# Patient Record
Sex: Female | Born: 1965
Health system: Southern US, Community
[De-identification: ages and names within clinical notes are randomized; demographics above are authoritative.]

## PROBLEM LIST (undated history)

## (undated) DIAGNOSIS — R079 Chest pain, unspecified: Secondary | ICD-10-CM

## (undated) DIAGNOSIS — R0600 Dyspnea, unspecified: Secondary | ICD-10-CM

## (undated) DIAGNOSIS — G629 Polyneuropathy, unspecified: Secondary | ICD-10-CM

## (undated) DIAGNOSIS — M5430 Sciatica, unspecified side: Secondary | ICD-10-CM

## (undated) DIAGNOSIS — L930 Discoid lupus erythematosus: Secondary | ICD-10-CM

## (undated) DIAGNOSIS — R600 Localized edema: Secondary | ICD-10-CM

## (undated) DIAGNOSIS — J449 Chronic obstructive pulmonary disease, unspecified: Secondary | ICD-10-CM

## (undated) DIAGNOSIS — M549 Dorsalgia, unspecified: Secondary | ICD-10-CM

## (undated) DIAGNOSIS — K5900 Constipation, unspecified: Secondary | ICD-10-CM

## (undated) DIAGNOSIS — F329 Major depressive disorder, single episode, unspecified: Secondary | ICD-10-CM

## (undated) DIAGNOSIS — M329 Systemic lupus erythematosus, unspecified: Secondary | ICD-10-CM

## (undated) DIAGNOSIS — F909 Attention-deficit hyperactivity disorder, unspecified type: Secondary | ICD-10-CM

## (undated) DIAGNOSIS — M1990 Unspecified osteoarthritis, unspecified site: Secondary | ICD-10-CM

## (undated) DIAGNOSIS — M25579 Pain in unspecified ankle and joints of unspecified foot: Secondary | ICD-10-CM

## (undated) DIAGNOSIS — F419 Anxiety disorder, unspecified: Secondary | ICD-10-CM

## (undated) DIAGNOSIS — Z803 Family history of malignant neoplasm of breast: Secondary | ICD-10-CM

## (undated) DIAGNOSIS — E039 Hypothyroidism, unspecified: Secondary | ICD-10-CM

## (undated) DIAGNOSIS — K219 Gastro-esophageal reflux disease without esophagitis: Secondary | ICD-10-CM

## (undated) DIAGNOSIS — I82409 Acute embolism and thrombosis of unspecified deep veins of unspecified lower extremity: Secondary | ICD-10-CM

## (undated) DIAGNOSIS — Z1379 Encounter for other screening for genetic and chromosomal anomalies: Secondary | ICD-10-CM

## (undated) DIAGNOSIS — M2550 Pain in unspecified joint: Secondary | ICD-10-CM

## (undated) DIAGNOSIS — F32A Depression, unspecified: Secondary | ICD-10-CM

## (undated) DIAGNOSIS — R7303 Prediabetes: Secondary | ICD-10-CM

## (undated) DIAGNOSIS — Z800 Family history of malignant neoplasm of digestive organs: Secondary | ICD-10-CM

## (undated) DIAGNOSIS — G4730 Sleep apnea, unspecified: Secondary | ICD-10-CM

## (undated) DIAGNOSIS — G9332 Myalgic encephalomyelitis/chronic fatigue syndrome: Secondary | ICD-10-CM

## (undated) DIAGNOSIS — R0602 Shortness of breath: Secondary | ICD-10-CM

## (undated) DIAGNOSIS — E559 Vitamin D deficiency, unspecified: Secondary | ICD-10-CM

## (undated) DIAGNOSIS — J45909 Unspecified asthma, uncomplicated: Secondary | ICD-10-CM

## (undated) DIAGNOSIS — G4700 Insomnia, unspecified: Secondary | ICD-10-CM

## (undated) DIAGNOSIS — M797 Fibromyalgia: Secondary | ICD-10-CM

## (undated) DIAGNOSIS — E538 Deficiency of other specified B group vitamins: Secondary | ICD-10-CM

## (undated) DIAGNOSIS — IMO0002 Reserved for concepts with insufficient information to code with codable children: Secondary | ICD-10-CM

## (undated) DIAGNOSIS — R232 Flushing: Secondary | ICD-10-CM

## (undated) DIAGNOSIS — R5382 Chronic fatigue, unspecified: Secondary | ICD-10-CM

## (undated) DIAGNOSIS — E669 Obesity, unspecified: Secondary | ICD-10-CM

## (undated) HISTORY — DX: Insomnia, unspecified: G47.00

## (undated) HISTORY — PX: ABDOMINAL HYSTERECTOMY: SHX81

## (undated) HISTORY — PX: KNEE ARTHROSCOPY W/ MENISCAL REPAIR: SHX1877

## (undated) HISTORY — DX: Vitamin D deficiency, unspecified: E55.9

## (undated) HISTORY — PX: APPENDECTOMY: SHX54

## (undated) HISTORY — DX: Chronic obstructive pulmonary disease, unspecified: J44.9

## (undated) HISTORY — DX: Obesity, unspecified: E66.9

## (undated) HISTORY — DX: Dyspnea, unspecified: R06.00

## (undated) HISTORY — DX: Flushing: R23.2

## (undated) HISTORY — DX: Hypothyroidism, unspecified: E03.9

## (undated) HISTORY — DX: Deficiency of other specified B group vitamins: E53.8

## (undated) HISTORY — DX: Fibromyalgia: M79.7

## (undated) HISTORY — DX: Encounter for other screening for genetic and chromosomal anomalies: Z13.79

## (undated) HISTORY — DX: Constipation, unspecified: K59.00

## (undated) HISTORY — DX: Family history of malignant neoplasm of digestive organs: Z80.0

## (undated) HISTORY — DX: Anxiety disorder, unspecified: F41.9

## (undated) HISTORY — DX: Dorsalgia, unspecified: M54.9

## (undated) HISTORY — DX: Unspecified osteoarthritis, unspecified site: M19.90

## (undated) HISTORY — PX: VEIN LIGATION AND STRIPPING: SHX2653

## (undated) HISTORY — DX: Polyneuropathy, unspecified: G62.9

## (undated) HISTORY — DX: Chest pain, unspecified: R07.9

## (undated) HISTORY — DX: Depression, unspecified: F32.A

## (undated) HISTORY — DX: Shortness of breath: R06.02

## (undated) HISTORY — DX: Gastro-esophageal reflux disease without esophagitis: K21.9

## (undated) HISTORY — DX: Prediabetes: R73.03

## (undated) HISTORY — DX: Sciatica, unspecified side: M54.30

## (undated) HISTORY — DX: Pain in unspecified joint: M25.50

## (undated) HISTORY — DX: Pain in unspecified ankle and joints of unspecified foot: M25.579

## (undated) HISTORY — DX: Family history of malignant neoplasm of breast: Z80.3

## (undated) HISTORY — DX: Major depressive disorder, single episode, unspecified: F32.9

## (undated) HISTORY — DX: Localized edema: R60.0

## (undated) HISTORY — DX: Attention-deficit hyperactivity disorder, unspecified type: F90.9

## (undated) HISTORY — DX: Chronic fatigue, unspecified: R53.82

## (undated) HISTORY — DX: Discoid lupus erythematosus: L93.0

## (undated) HISTORY — DX: Myalgic encephalomyelitis/chronic fatigue syndrome: G93.32

---

## 1985-07-05 HISTORY — PX: TUBAL LIGATION: SHX77

## 2010-04-03 ENCOUNTER — Ambulatory Visit: Payer: Self-pay | Admitting: Oncology

## 2010-04-29 LAB — CBC & DIFF AND RETIC
BASO%: 0.4 % (ref 0.0–2.0)
HCT: 41 % (ref 34.8–46.6)
Immature Retic Fract: 5.3 % (ref 0.00–10.70)
MCHC: 34.6 g/dL (ref 31.5–36.0)
MONO#: 0.5 10*3/uL (ref 0.1–0.9)
RBC: 4.46 10*6/uL (ref 3.70–5.45)
RDW: 14.6 % — ABNORMAL HIGH (ref 11.2–14.5)
Retic %: 0.91 % (ref 0.50–1.50)
Retic Ct Abs: 40.59 10*3/uL (ref 18.30–72.70)
WBC: 7.1 10*3/uL (ref 3.9–10.3)
lymph#: 1.9 10*3/uL (ref 0.9–3.3)

## 2010-04-29 LAB — MORPHOLOGY
PLT EST: ADEQUATE
RBC Comments: NORMAL

## 2010-04-29 LAB — CHCC SMEAR

## 2010-04-29 LAB — APTT: aPTT: 42 seconds — ABNORMAL HIGH (ref 24–37)

## 2010-05-01 LAB — COMPREHENSIVE METABOLIC PANEL
Alkaline Phosphatase: 65 U/L (ref 39–117)
BUN: 9 mg/dL (ref 6–23)
CO2: 20 mEq/L (ref 19–32)
Glucose, Bld: 81 mg/dL (ref 70–99)
Total Bilirubin: 0.3 mg/dL (ref 0.3–1.2)

## 2010-05-01 LAB — LACTATE DEHYDROGENASE: LDH: 176 U/L (ref 94–250)

## 2010-05-01 LAB — PROTHROMBIN GENE MUTATION

## 2010-05-01 LAB — SEDIMENTATION RATE: Sed Rate: 9 mm/hr (ref 0–22)

## 2010-05-01 LAB — ANTITHROMBIN III: AntiThromb III Func: 119 % (ref 76–126)

## 2010-05-01 LAB — D-DIMER, QUANTITATIVE: D-Dimer, Quant: 0.22 ug/mL-FEU (ref 0.00–0.48)

## 2010-05-19 ENCOUNTER — Ambulatory Visit: Payer: Self-pay | Admitting: Oncology

## 2010-05-21 LAB — PROTEIN C ACTIVITY: Protein C Activity: 145 % — ABNORMAL HIGH (ref 75–133)

## 2010-05-21 LAB — PROTEIN S, ANTIGEN, FREE: Protein S Ag, Free: 102 % normal (ref 50–147)

## 2010-07-02 ENCOUNTER — Ambulatory Visit: Payer: Self-pay | Admitting: Oncology

## 2010-12-25 ENCOUNTER — Other Ambulatory Visit: Payer: Self-pay | Admitting: Oncology

## 2010-12-25 ENCOUNTER — Encounter (HOSPITAL_BASED_OUTPATIENT_CLINIC_OR_DEPARTMENT_OTHER): Payer: PRIVATE HEALTH INSURANCE | Admitting: Oncology

## 2010-12-25 DIAGNOSIS — I82819 Embolism and thrombosis of superficial veins of unspecified lower extremities: Secondary | ICD-10-CM

## 2010-12-25 DIAGNOSIS — R3129 Other microscopic hematuria: Secondary | ICD-10-CM

## 2010-12-25 DIAGNOSIS — M329 Systemic lupus erythematosus, unspecified: Secondary | ICD-10-CM

## 2010-12-25 LAB — COMPREHENSIVE METABOLIC PANEL
ALT: 19 U/L (ref 0–35)
AST: 19 U/L (ref 0–37)
Albumin: 4 g/dL (ref 3.5–5.2)
CO2: 23 mEq/L (ref 19–32)
Calcium: 9.8 mg/dL (ref 8.4–10.5)
Chloride: 102 mEq/L (ref 96–112)
Potassium: 4 mEq/L (ref 3.5–5.3)
Sodium: 137 mEq/L (ref 135–145)
Total Protein: 6.7 g/dL (ref 6.0–8.3)

## 2010-12-25 LAB — URINALYSIS, MICROSCOPIC - CHCC
Nitrite: NEGATIVE
Protein: NEGATIVE mg/dL
Specific Gravity, Urine: 1.02 (ref 1.003–1.035)
WBC, UA: NEGATIVE (ref 0–2)
pH: 6.5 (ref 4.6–8.0)

## 2010-12-25 LAB — CBC WITH DIFFERENTIAL/PLATELET
BASO%: 0.4 % (ref 0.0–2.0)
Basophils Absolute: 0 10*3/uL (ref 0.0–0.1)
EOS%: 1.8 % (ref 0.0–7.0)
HGB: 14.4 g/dL (ref 11.6–15.9)
MCH: 32.9 pg (ref 25.1–34.0)
MCHC: 34.8 g/dL (ref 31.5–36.0)
MCV: 94.7 fL (ref 79.5–101.0)
MONO%: 9 % (ref 0.0–14.0)
RBC: 4.38 10*6/uL (ref 3.70–5.45)
RDW: 13 % (ref 11.2–14.5)
lymph#: 1.7 10*3/uL (ref 0.9–3.3)

## 2010-12-25 LAB — LACTATE DEHYDROGENASE: LDH: 157 U/L (ref 94–250)

## 2010-12-25 LAB — PROTIME-INR: INR: 1 — ABNORMAL LOW (ref 2.00–3.50)

## 2011-05-20 ENCOUNTER — Telehealth: Payer: Self-pay | Admitting: Oncology

## 2011-05-20 NOTE — Telephone Encounter (Signed)
Lvm advising 12/14 appt has been cx'd due to Epic. Advised in vm, we will call back to r/s. °

## 2011-05-22 ENCOUNTER — Telehealth: Payer: Self-pay | Admitting: Oncology

## 2011-05-22 NOTE — Telephone Encounter (Signed)
S/w pt to r/s 12/14 cx'd appt. Pt says she is no loger seeing Dr. Cyndie Chime because her insurance no longer covers it. Pt stated she transferred her care. No appts made.

## 2014-11-18 ENCOUNTER — Ambulatory Visit: Payer: Self-pay | Admitting: Internal Medicine

## 2014-11-25 ENCOUNTER — Ambulatory Visit: Payer: Self-pay | Admitting: Internal Medicine

## 2014-11-28 ENCOUNTER — Emergency Department (HOSPITAL_BASED_OUTPATIENT_CLINIC_OR_DEPARTMENT_OTHER)
Admit: 2014-11-28 | Discharge: 2014-11-28 | Disposition: A | Discharge status: Home / Self Care | Payer: BLUE CROSS/BLUE SHIELD | Attending: Emergency Medicine | Admitting: Emergency Medicine

## 2014-11-28 ENCOUNTER — Emergency Department (HOSPITAL_COMMUNITY): Payer: BLUE CROSS/BLUE SHIELD

## 2014-11-28 ENCOUNTER — Encounter (HOSPITAL_COMMUNITY): Payer: Self-pay | Admitting: Emergency Medicine

## 2014-11-28 ENCOUNTER — Emergency Department (HOSPITAL_COMMUNITY)
Admission: EM | Admit: 2014-11-28 | Discharge: 2014-11-28 | Disposition: A | Discharge status: Home / Self Care | Payer: BLUE CROSS/BLUE SHIELD | Attending: Emergency Medicine | Admitting: Emergency Medicine

## 2014-11-28 DIAGNOSIS — Z86718 Personal history of other venous thrombosis and embolism: Secondary | ICD-10-CM | POA: Diagnosis not present

## 2014-11-28 DIAGNOSIS — M7989 Other specified soft tissue disorders: Secondary | ICD-10-CM | POA: Diagnosis not present

## 2014-11-28 DIAGNOSIS — J45901 Unspecified asthma with (acute) exacerbation: Secondary | ICD-10-CM | POA: Diagnosis not present

## 2014-11-28 DIAGNOSIS — Z87828 Personal history of other (healed) physical injury and trauma: Secondary | ICD-10-CM | POA: Insufficient documentation

## 2014-11-28 DIAGNOSIS — M79609 Pain in unspecified limb: Secondary | ICD-10-CM

## 2014-11-28 DIAGNOSIS — I809 Phlebitis and thrombophlebitis of unspecified site: Secondary | ICD-10-CM

## 2014-11-28 DIAGNOSIS — I8002 Phlebitis and thrombophlebitis of superficial vessels of left lower extremity: Secondary | ICD-10-CM | POA: Diagnosis not present

## 2014-11-28 DIAGNOSIS — Z7982 Long term (current) use of aspirin: Secondary | ICD-10-CM | POA: Insufficient documentation

## 2014-11-28 DIAGNOSIS — Z79899 Other long term (current) drug therapy: Secondary | ICD-10-CM | POA: Insufficient documentation

## 2014-11-28 DIAGNOSIS — Z720 Tobacco use: Secondary | ICD-10-CM | POA: Diagnosis not present

## 2014-11-28 DIAGNOSIS — R000 Tachycardia, unspecified: Secondary | ICD-10-CM | POA: Insufficient documentation

## 2014-11-28 DIAGNOSIS — Z8669 Personal history of other diseases of the nervous system and sense organs: Secondary | ICD-10-CM | POA: Insufficient documentation

## 2014-11-28 DIAGNOSIS — R0602 Shortness of breath: Secondary | ICD-10-CM

## 2014-11-28 HISTORY — DX: Sleep apnea, unspecified: G47.30

## 2014-11-28 HISTORY — DX: Unspecified asthma, uncomplicated: J45.909

## 2014-11-28 HISTORY — DX: Systemic lupus erythematosus, unspecified: M32.9

## 2014-11-28 HISTORY — DX: Reserved for concepts with insufficient information to code with codable children: IMO0002

## 2014-11-28 HISTORY — DX: Acute embolism and thrombosis of unspecified deep veins of unspecified lower extremity: I82.409

## 2014-11-28 LAB — CBC
HEMATOCRIT: 44.4 % (ref 36.0–46.0)
HEMOGLOBIN: 14.9 g/dL (ref 12.0–15.0)
MCH: 31.2 pg (ref 26.0–34.0)
MCHC: 33.6 g/dL (ref 30.0–36.0)
MCV: 93.1 fL (ref 78.0–100.0)
Platelets: 325 10*3/uL (ref 150–400)
RBC: 4.77 MIL/uL (ref 3.87–5.11)
RDW: 13.2 % (ref 11.5–15.5)
WBC: 6.9 10*3/uL (ref 4.0–10.5)

## 2014-11-28 LAB — I-STAT TROPONIN, ED: Troponin i, poc: 0 ng/mL (ref 0.00–0.08)

## 2014-11-28 LAB — BASIC METABOLIC PANEL
ANION GAP: 10 (ref 5–15)
BUN: 17 mg/dL (ref 6–20)
CHLORIDE: 102 mmol/L (ref 101–111)
CO2: 26 mmol/L (ref 22–32)
CREATININE: 0.77 mg/dL (ref 0.44–1.00)
Calcium: 9.7 mg/dL (ref 8.9–10.3)
GFR calc Af Amer: >60 mL/min (ref >60)
GFR calc non Af Amer: >60 mL/min (ref >60)
GLUCOSE: 86 mg/dL (ref 65–99)
Potassium: 4.2 mmol/L (ref 3.5–5.1)
Sodium: 138 mmol/L (ref 135–145)

## 2014-11-28 LAB — PROTIME-INR
INR: 1.02 (ref 0.00–1.49)
PROTHROMBIN TIME: 13.6 s (ref 11.6–15.2)

## 2014-11-28 MED ORDER — IOHEXOL 350 MG/ML SOLN
100.0000 mL | Freq: Once | INTRAVENOUS | Status: AC | PRN
  Administered 2014-11-28: 100 mL via INTRAVENOUS

## 2014-11-28 MED ORDER — MORPHINE SULFATE 4 MG/ML IJ SOLN
4.0000 mg | INTRAMUSCULAR | Status: DC | PRN
  Administered 2014-11-28: 4 mg via INTRAVENOUS
  Filled 2014-11-28: qty 1

## 2014-11-28 MED ORDER — ONDANSETRON HCL 4 MG/2ML IJ SOLN
4.0000 mg | Freq: Once | INTRAMUSCULAR | Status: AC
  Administered 2014-11-28: 4 mg via INTRAVENOUS
  Filled 2014-11-28: qty 2

## 2014-11-28 NOTE — Progress Notes (Signed)
Left lower extremity venous duplex completed.  Left:  No evidence of DVT, superficial thrombosis, or Baker's cyst.  Right:  Negative for DVT in the common femoral vein.  

## 2014-11-28 NOTE — Discharge Instructions (Signed)
Increase Aspirin to am and pm. Support stockings. Stay active. Apply heat for 20 minutes at a time 2-3 times per day.  Phlebitis Phlebitis is soreness and swelling (inflammation) of a vein. This can occur in your arms, legs, or torso (trunk), as well as deeper inside your body. Phlebitis is usually not serious when it occurs close to the surface of the body. However, it can cause serious problems when it occurs in a vein deeper inside the body. CAUSES  Phlebitis can be triggered by various things, including:   Reduced blood flow through your veins. This can happen with:  Bed rest over a long period.  Long-distance travel.  Injury.  Surgery.  Being overweight (obese) or pregnant.  Having an IV tube put in the vein and getting certain medicines through the vein.  Cancer and cancer treatment.  Use of illegal drugs taken through the vein.  Inflammatory diseases.  Inherited (genetic) diseases that increase the risk of blood clots.  Hormone therapy, such as birth control pills. SIGNS AND SYMPTOMS   Red, tender, swollen, and painful area on your skin. Usually, the area will be long and narrow.  Firmness along the center of the affected area. This can indicate that a blood clot has formed.  Low-grade fever. DIAGNOSIS  A health care provider can usually diagnose phlebitis by examining the affected area and asking about your symptoms. To check for infection or blood clots, your health care provider may order blood tests or an ultrasound exam of the area. Blood tests and your family history may also indicate if you have an underlying genetic disease that causes blood clots. Occasionally, a piece of tissue is taken from the body (biopsy sample) if an unusual cause of phlebitis is suspected. TREATMENT  Treatment will vary depending on the severity of the condition and the area of the body affected. Treatment may include:  Use of a warm compress or heating pad.  Use of compression  stockings or bandages.  Anti-inflammatory medicines.  Removal of any IV tube that may be causing the problem.  Medicines that kill germs (antibiotics) if an infection is present.  Blood-thinning medicines if a blood clot is suspected or present.  In rare cases, surgery may be needed to remove damaged sections of vein. HOME CARE INSTRUCTIONS   Only take over-the-counter or prescription medicines as directed by your health care provider. Take all medicines exactly as prescribed.  Raise (elevate) the affected area above the level of your heart as directed by your health care provider.  Apply a warm compress or heating pad to the affected area as directed by your health care provider. Do not sleep with the heating pad.  Use compression stockings or bandages as directed. These will speed healing and prevent the condition from coming back.  If you are on blood thinners:  Get follow-up blood tests as directed by your health care provider.  Check with your health care provider before using any new medicines.  Carry a medical alert card or wear your medical alert jewelry to show that you are on blood thinners.  For phlebitis in the legs:  Avoid prolonged standing or bed rest.  Keep your legs moving. Raise your legs when sitting or lying.  Do not smoke.  Women, particularly those over the age of 43, should consider the risks and benefits of taking the contraceptive pill. This kind of hormone treatment can increase your risk for blood clots.  Follow up with your health care provider as directed.  SEEK MEDICAL CARE IF:   You have unusual bruising or any bleeding problems.  Your swelling or pain in the affected area is not improving.  You are on anti-inflammatory medicine, and you develop belly (abdominal) pain. SEEK IMMEDIATE MEDICAL CARE IF:   You have a sudden onset of chest pain or difficulty breathing.  You have a fever or persistent symptoms for more than 2-3 days.  You  have a fever and your symptoms suddenly get worse. MAKE SURE YOU:  Understand these instructions.  Will watch your condition.  Will get help right away if you are not doing well or get worse. Document Released: 06/15/2001 Document Revised: 04/11/2013 Document Reviewed: 02/26/2013 [facility] Patient Information 2015 [employer] [place]. This information is not intended to replace advice given to you by your health care provider. Make sure you discuss any questions you have with your health care provider.

## 2014-11-28 NOTE — ED Notes (Signed)
C/o left leg swelling x 2 days with hx of DVT in same leg 2000--- was on coumadin, then pradaxa, now ASA/qd--  Left leg swollen, pain at medial knee area.
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
## 2014-11-28 NOTE — ED Notes (Signed)
Attempted IV access x2 by this RN, unable to thread IV

## 2014-12-09 ENCOUNTER — Telehealth: Payer: Self-pay | Admitting: Internal Medicine

## 2014-12-09 ENCOUNTER — Encounter: Payer: Self-pay | Admitting: Internal Medicine

## 2014-12-09 ENCOUNTER — Other Ambulatory Visit (INDEPENDENT_AMBULATORY_CARE_PROVIDER_SITE_OTHER): Payer: BLUE CROSS/BLUE SHIELD

## 2014-12-09 ENCOUNTER — Ambulatory Visit (INDEPENDENT_AMBULATORY_CARE_PROVIDER_SITE_OTHER): Payer: BLUE CROSS/BLUE SHIELD | Admitting: Internal Medicine

## 2014-12-09 VITALS — BP 130/98 | HR 98 | Ht 65.0 in | Wt 252.0 lb

## 2014-12-09 DIAGNOSIS — Z6838 Body mass index (BMI) 38.0-38.9, adult: Secondary | ICD-10-CM

## 2014-12-09 DIAGNOSIS — G4733 Obstructive sleep apnea (adult) (pediatric): Secondary | ICD-10-CM

## 2014-12-09 DIAGNOSIS — E039 Hypothyroidism, unspecified: Secondary | ICD-10-CM | POA: Insufficient documentation

## 2014-12-09 DIAGNOSIS — R5382 Chronic fatigue, unspecified: Secondary | ICD-10-CM | POA: Diagnosis not present

## 2014-12-09 DIAGNOSIS — R202 Paresthesia of skin: Secondary | ICD-10-CM

## 2014-12-09 DIAGNOSIS — E034 Atrophy of thyroid (acquired): Secondary | ICD-10-CM

## 2014-12-09 DIAGNOSIS — G2581 Restless legs syndrome: Secondary | ICD-10-CM

## 2014-12-09 DIAGNOSIS — Z1239 Encounter for other screening for malignant neoplasm of breast: Secondary | ICD-10-CM

## 2014-12-09 DIAGNOSIS — F17210 Nicotine dependence, cigarettes, uncomplicated: Secondary | ICD-10-CM

## 2014-12-09 DIAGNOSIS — G9332 Myalgic encephalomyelitis/chronic fatigue syndrome: Secondary | ICD-10-CM

## 2014-12-09 DIAGNOSIS — Z86718 Personal history of other venous thrombosis and embolism: Secondary | ICD-10-CM | POA: Insufficient documentation

## 2014-12-09 DIAGNOSIS — E038 Other specified hypothyroidism: Secondary | ICD-10-CM

## 2014-12-09 DIAGNOSIS — L930 Discoid lupus erythematosus: Secondary | ICD-10-CM | POA: Insufficient documentation

## 2014-12-09 LAB — BASIC METABOLIC PANEL
BUN: 16 mg/dL (ref 6–23)
CHLORIDE: 105 meq/L (ref 96–112)
CO2: 29 mEq/L (ref 19–32)
CREATININE: 0.64 mg/dL (ref 0.40–1.20)
Calcium: 9.8 mg/dL (ref 8.4–10.5)
GFR: 104.35 mL/min (ref >60.00)
Glucose, Bld: 88 mg/dL (ref 70–99)
POTASSIUM: 4 meq/L (ref 3.5–5.1)
Sodium: 140 mEq/L (ref 135–145)

## 2014-12-09 LAB — CBC WITH DIFFERENTIAL/PLATELET
BASOS ABS: 0 10*3/uL (ref 0.0–0.1)
Basophils Relative: 0.3 % (ref 0.0–3.0)
EOS PCT: 2.4 % (ref 0.0–5.0)
Eosinophils Absolute: 0.2 10*3/uL (ref 0.0–0.7)
HCT: 45 % (ref 36.0–46.0)
Hemoglobin: 15 g/dL (ref 12.0–15.0)
LYMPHS ABS: 1.8 10*3/uL (ref 0.7–4.0)
Lymphocytes Relative: 20.7 % (ref 12.0–46.0)
MCHC: 33.3 g/dL (ref 30.0–36.0)
MCV: 92.7 fl (ref 78.0–100.0)
Monocytes Absolute: 0.7 10*3/uL (ref 0.1–1.0)
Monocytes Relative: 7.8 % (ref 3.0–12.0)
NEUTROS ABS: 6 10*3/uL (ref 1.4–7.7)
Neutrophils Relative %: 68.8 % (ref 43.0–77.0)
Platelets: 364 10*3/uL (ref 150.0–400.0)
RBC: 4.86 Mil/uL (ref 3.87–5.11)
RDW: 13.7 % (ref 11.5–15.5)
WBC: 8.8 10*3/uL (ref 4.0–10.5)

## 2014-12-09 LAB — URINALYSIS
Bilirubin Urine: NEGATIVE
Hgb urine dipstick: NEGATIVE
Ketones, ur: NEGATIVE
Leukocytes, UA: NEGATIVE
Nitrite: NEGATIVE
Specific Gravity, Urine: 1.015 (ref 1.000–1.030)
TOTAL PROTEIN, URINE-UPE24: NEGATIVE
Urine Glucose: NEGATIVE
Urobilinogen, UA: 0.2 (ref 0.0–1.0)
pH: 7 (ref 5.0–8.0)

## 2014-12-09 LAB — LIPID PANEL
Cholesterol: 184 mg/dL (ref 0–200)
HDL: 61.4 mg/dL (ref >39.00)
LDL Cholesterol: 100 mg/dL — ABNORMAL HIGH (ref 0–99)
NonHDL: 122.6
Total CHOL/HDL Ratio: 3
Triglycerides: 112 mg/dL (ref 0.0–149.0)
VLDL: 22.4 mg/dL (ref 0.0–40.0)

## 2014-12-09 LAB — HEPATIC FUNCTION PANEL
ALT: 18 U/L (ref 0–35)
AST: 17 U/L (ref 0–37)
Albumin: 4.5 g/dL (ref 3.5–5.2)
Alkaline Phosphatase: 88 U/L (ref 39–117)
BILIRUBIN DIRECT: 0 mg/dL (ref 0.0–0.3)
Total Bilirubin: 0.3 mg/dL (ref 0.2–1.2)
Total Protein: 7.4 g/dL (ref 6.0–8.3)

## 2014-12-09 LAB — T4, FREE: Free T4: 0.77 ng/dL (ref 0.60–1.60)

## 2014-12-09 LAB — TSH: TSH: 0.84 u[IU]/mL (ref 0.35–4.50)

## 2014-12-09 LAB — VITAMIN D 25 HYDROXY (VIT D DEFICIENCY, FRACTURES): VITD: 41.25 ng/mL (ref 30.00–100.00)

## 2014-12-09 LAB — VITAMIN B12: Vitamin B-12: 230 pg/mL (ref 211–911)

## 2014-12-09 MED ORDER — CYANOCOBALAMIN 1000 MCG SL SUBL: 1.0000 | SUBLINGUAL_TABLET | Freq: Every day | SUBLINGUAL | Status: DC

## 2014-12-09 MED ORDER — VITAMIN D3 50 MCG (2000 UT) PO CAPS: 2000.0000 [IU] | ORAL_CAPSULE | Freq: Every day | ORAL | Status: DC

## 2014-12-09 MED ORDER — AMPHETAMINE-DEXTROAMPHETAMINE 10 MG PO TABS: 20.0000 mg | ORAL_TABLET | Freq: Two times a day (BID) | ORAL | Status: DC

## 2014-12-09 MED ORDER — ALPRAZOLAM 1 MG PO TABS: 1.0000 mg | ORAL_TABLET | Freq: Every evening | ORAL | Status: DC | PRN

## 2014-12-09 MED ORDER — ROPINIROLE HCL 0.5 MG PO TABS: 0.5000 mg | ORAL_TABLET | Freq: Every day | ORAL | Status: DC

## 2014-12-09 MED ORDER — TRAMADOL HCL 50 MG PO TABS: 50.0000 mg | ORAL_TABLET | Freq: Two times a day (BID) | ORAL | Status: DC

## 2014-12-09 MED ORDER — GABAPENTIN 300 MG PO CAPS: 300.0000 mg | ORAL_CAPSULE | Freq: Every day | ORAL | Status: DC

## 2014-12-09 NOTE — Telephone Encounter (Signed)
Needs to verify directions on Adderall.

## 2014-12-09 NOTE — Assessment & Plan Note (Signed)
Labs

## 2014-12-09 NOTE — Assessment & Plan Note (Signed)
OSA, meds (chronic -Tramadol, Requip, Gabapentin, Trazodone) , other causes Pt is willing to d/c Trazodone On Adderall per her former PCP Neurol ref to address OSA treatment Labs

## 2014-12-09 NOTE — Assessment & Plan Note (Signed)
Tramadol, Requip, Gabapentin Neurol ref Dr Vickey Hugerohmeier

## 2014-12-09 NOTE — Telephone Encounter (Signed)
Patient also called in regards to this script.  [facility].  Is requesting to be resent.

## 2014-12-09 NOTE — Assessment & Plan Note (Signed)
LLE 2000

## 2014-12-09 NOTE — Assessment & Plan Note (Signed)
Chronic LLE sciatica On Tramadol, Gabapentin

## 2014-12-09 NOTE — Assessment & Plan Note (Signed)
Rash Remote dx by bx

## 2014-12-09 NOTE — Assessment & Plan Note (Signed)
Discussed.

## 2014-12-09 NOTE — Assessment & Plan Note (Signed)
Discussed options including lap band

## 2014-12-09 NOTE — Progress Notes (Signed)
Pre visit review using our clinic review tool, if applicable. No additional management support is needed unless otherwise documented below in the visit note. 

## 2014-12-09 NOTE — Progress Notes (Signed)
Subjective:    HPI   New pt  C/o stress and fatigue x years. C/o wt gain; unable to loose wt. F/u hypothyroidism. C/o OSA -  not on Rx. C/o RLS - requip, tramadol is helpful. C/o ADD/CFS/shift work - she was prescribed Adderal by her PCP. 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  Review of Systems  Constitutional: Positive for fatigue. Negative for fever, chills, diaphoresis, activity change, appetite change and unexpected weight change.  HENT: Negative for congestion, dental problem, ear pain, hearing loss, mouth sores, postnasal drip, sinus pressure, sneezing, sore throat and voice change.   Eyes: Negative for pain and visual disturbance.  Respiratory: Negative for cough, chest tightness, wheezing and stridor.   Cardiovascular: Negative for chest pain, palpitations and leg swelling.  Gastrointestinal: Negative for nausea, vomiting, abdominal pain, blood in stool, abdominal distention and rectal pain.  Genitourinary: Negative for dysuria, frequency, hematuria, decreased urine volume, vaginal bleeding, vaginal discharge, difficulty urinating, vaginal pain and menstrual problem.  Musculoskeletal: Positive for arthralgias. Negative for back pain, joint swelling, gait problem and neck pain.  Skin: Negative for color change, rash and wound.  Neurological: Positive for dizziness and headaches. Negative for tremors, syncope, speech difficulty, weakness and light-headedness.  Hematological: Negative for adenopathy.  Psychiatric/Behavioral: Positive for dysphoric mood. Negative for suicidal ideas, hallucinations, behavioral problems, confusion, sleep disturbance and decreased concentration. The patient is nervous/anxious. The patient is not hyperactive.     Wt  Readings from Last 3 Encounters:  12/09/14 252 lb (114.306 kg)  11/28/14 245 lb (111.131 kg)    BP Readings from Last 3 Encounters:  12/09/14 130/98  11/28/14 111/68        Objective:   Physical Exam  Constitutional: She appears well-developed. No distress.  HENT:  Head: Normocephalic.  Right Ear: External ear normal.  Left Ear: External ear normal.  Nose: Nose normal.  Mouth/Throat: Oropharynx is clear and moist.  Eyes: Conjunctivae are normal. Pupils are equal, round, and reactive to light. Right eye exhibits no discharge. Left eye exhibits no discharge.  Neck: Normal range of motion. Neck supple. No JVD present. No tracheal deviation present. No thyromegaly present.  Cardiovascular: Normal rate, regular rhythm and normal heart sounds.   Pulmonary/Chest: No stridor. No respiratory distress. She has no wheezes.  Abdominal: Soft. Bowel sounds are normal. She exhibits no distension and no mass. There is no tenderness. There is no rebound and no guarding.  Musculoskeletal: She exhibits no edema or tenderness.  Lymphadenopathy:    She has no cervical adenopathy.  Neurological: She displays normal reflexes. No cranial nerve deficit. She exhibits normal muscle tone. Coordination normal.  Skin: No rash noted. No erythema.  Psychiatric: Her behavior is normal. Judgment and thought content normal.  Obese  Lab Results  Component Value Date   WBC 6.9 11/28/2014   HGB 14.9 11/28/2014   HCT 44.4 11/28/2014   PLT 325 11/28/2014   GLUCOSE 86 11/28/2014   ALT 19 12/25/2010   AST 19 12/25/2010   NA 138 11/28/2014   K 4.2 11/28/2014   CL 102 11/28/2014   CREATININE 0.77 11/28/2014   BUN 17 11/28/2014   CO2 26 11/28/2014   INR 1.02 11/28/2014  Assessment & Plan:

## 2014-12-10 ENCOUNTER — Telehealth: Payer: Self-pay

## 2014-12-10 MED ORDER — ROPINIROLE HCL 0.5 MG PO TABS: 0.5000 mg | ORAL_TABLET | Freq: Every day | ORAL | Status: DC

## 2014-12-10 MED ORDER — AMPHETAMINE-DEXTROAMPHETAMINE 10 MG PO TABS: 10.0000 mg | ORAL_TABLET | Freq: Two times a day (BID) | ORAL | Status: DC

## 2014-12-10 MED ORDER — AMPHETAMINE-DEXTROAMPHETAMINE 10 MG PO TABS: 20.0000 mg | ORAL_TABLET | Freq: Every day | ORAL | Status: DC

## 2014-12-10 NOTE — Telephone Encounter (Signed)
Sorry: Adderall 10 2 in an or 1 bid (am and noon) is correct. Requip renewed Thx

## 2014-12-10 NOTE — Telephone Encounter (Signed)
Pt informed of labs.   Stated that there was an issue with the rx for adderall, cyanacobalamin and requip.   LVM for pharmacist to call me back.

## 2014-12-10 NOTE — Telephone Encounter (Signed)
Clarify sig for Adderall.

## 2014-12-10 NOTE — Telephone Encounter (Signed)
[facility] They have the verbal change on the adderrall sig: 10mg  bid.  They also have the rx for the subl B12 and for the requip. Pharmacist stated that the requip is not able to be filled at this time. 90d picked up in May.

## 2014-12-11 NOTE — Telephone Encounter (Signed)
Ernestine McmurrayStefannie M Cairrikier, CMA at 12/10/2014 1:30 PM     Status: Signed       Expand All Collapse All   [facility]. They have the verbal change on the adderrall sig: 10mg  bid.  They also have the rx for the subl B12 and for the requip. Pharmacist stated that the requip is not able to be filled at this time. 90d picked up in May.

## 2014-12-14 ENCOUNTER — Telehealth: Payer: Self-pay | Admitting: Internal Medicine

## 2014-12-14 NOTE — Telephone Encounter (Signed)
Records received from [facility], forwarded to [facility], rmf

## 2014-12-20 ENCOUNTER — Telehealth: Payer: Self-pay

## 2014-12-20 NOTE — Telephone Encounter (Signed)
PA for Adderall started 12/08/2014.  Received a fax today stating that a PA was not required.   Pharmacy contacted and Pt is informed.

## 2014-12-31 ENCOUNTER — Encounter: Payer: Self-pay | Admitting: Internal Medicine

## 2014-12-31 ENCOUNTER — Ambulatory Visit: Payer: Self-pay | Admitting: Internal Medicine

## 2015-01-07 ENCOUNTER — Telehealth: Payer: Self-pay | Admitting: Internal Medicine

## 2015-01-07 MED ORDER — AMPHETAMINE-DEXTROAMPHETAMINE 10 MG PO TABS: 10.0000 mg | ORAL_TABLET | Freq: Two times a day (BID) | ORAL | Status: DC

## 2015-01-07 NOTE — Telephone Encounter (Signed)
Ok to fill per MD. Keep ROV as scheduled. Rx printed/signed/given to pt.

## 2015-01-07 NOTE — Telephone Encounter (Signed)
Patient need a new prescription for Adderall

## 2015-01-20 ENCOUNTER — Ambulatory Visit (INDEPENDENT_AMBULATORY_CARE_PROVIDER_SITE_OTHER): Payer: BLUE CROSS/BLUE SHIELD | Admitting: Internal Medicine

## 2015-01-20 ENCOUNTER — Encounter: Payer: Self-pay | Admitting: Internal Medicine

## 2015-01-20 VITALS — BP 132/96 | HR 102 | Ht 65.0 in | Wt 245.0 lb

## 2015-01-20 DIAGNOSIS — F909 Attention-deficit hyperactivity disorder, unspecified type: Secondary | ICD-10-CM

## 2015-01-20 DIAGNOSIS — L930 Discoid lupus erythematosus: Secondary | ICD-10-CM

## 2015-01-20 DIAGNOSIS — R21 Rash and other nonspecific skin eruption: Secondary | ICD-10-CM | POA: Diagnosis not present

## 2015-01-20 DIAGNOSIS — F988 Other specified behavioral and emotional disorders with onset usually occurring in childhood and adolescence: Secondary | ICD-10-CM

## 2015-01-20 MED ORDER — LEVOTHYROXINE SODIUM 100 MCG PO TABS: 100.0000 ug | ORAL_TABLET | Freq: Every day | ORAL | Status: DC

## 2015-01-20 MED ORDER — ROPINIROLE HCL 0.5 MG PO TABS: 0.5000 mg | ORAL_TABLET | Freq: Every day | ORAL | Status: DC

## 2015-01-20 MED ORDER — LORATADINE 10 MG PO TABS: 10.0000 mg | ORAL_TABLET | Freq: Every day | ORAL | Status: DC

## 2015-01-20 MED ORDER — AMPHETAMINE-DEXTROAMPHETAMINE 10 MG PO TABS: 10.0000 mg | ORAL_TABLET | Freq: Two times a day (BID) | ORAL | Status: DC

## 2015-01-20 MED ORDER — CYANOCOBALAMIN 1000 MCG SL SUBL: 1.0000 | SUBLINGUAL_TABLET | Freq: Every day | SUBLINGUAL | Status: DC

## 2015-01-20 MED ORDER — TRIAMCINOLONE ACETONIDE 0.1 % EX CREA: 1.0000 "application " | TOPICAL_CREAM | Freq: Two times a day (BID) | CUTANEOUS | Status: DC

## 2015-01-20 MED ORDER — VITAMIN D3 50 MCG (2000 UT) PO CAPS: 2000.0000 [IU] | ORAL_CAPSULE | Freq: Every day | ORAL | Status: DC

## 2015-01-20 MED ORDER — GABAPENTIN 300 MG PO CAPS: 300.0000 mg | ORAL_CAPSULE | Freq: Every day | ORAL | Status: DC

## 2015-01-20 MED ORDER — TRAMADOL HCL 50 MG PO TABS: 50.0000 mg | ORAL_TABLET | Freq: Two times a day (BID) | ORAL | Status: DC

## 2015-01-20 MED ORDER — ALPRAZOLAM 1 MG PO TABS: 1.0000 mg | ORAL_TABLET | Freq: Every evening | ORAL | Status: DC | PRN

## 2015-01-20 MED ORDER — DIPHENHYDRAMINE HCL 25 MG PO TABS: 25.0000 mg | ORAL_TABLET | Freq: Four times a day (QID) | ORAL | Status: DC | PRN

## 2015-01-20 NOTE — Progress Notes (Signed)
Pre visit review using our clinic review tool, if applicable. No additional management support is needed unless otherwise documented below in the visit note. 

## 2015-01-20 NOTE — Progress Notes (Signed)
Subjective:  Patient ID: Tiffany Medina, female    DOB: October 16, 1965  Age: 49 y.o. MRN: [id number]  CC: No chief complaint on file.   HPI [name] presents for ADD, anxiety, low Vit B12 and Vit D low rates. C/o hot flashes - rash on chest and face.x few weeks off and on. C/o rash on LLE x3-4 mo  Outpatient Prescriptions Prior to Visit  Medication Sig Dispense Refill  . aspirin 325 MG tablet Take 325 mg by mouth 2 (two) times daily.     . ranitidine (ZANTAC) 150 MG tablet Take 150 mg by mouth daily.    Marland Kitchen ALPRAZolam (XANAX) 1 MG tablet Take 1 tablet (1 mg total) by mouth at bedtime as needed for anxiety. 30 tablet 2  . amphetamine-dextroamphetamine (ADDERALL) 10 MG tablet Take 1 tablet (10 mg total) by mouth 2 (two) times daily. 60 tablet 0  . Cholecalciferol (VITAMIN D3) 2000 UNITS capsule Take 1 capsule (2,000 Units total) by mouth daily. 100 capsule 3  . Cyanocobalamin 1000 MCG SUBL Place 1 tablet (1,000 mcg total) under the tongue daily. 100 tablet 11  . gabapentin (NEURONTIN) 300 MG capsule Take 1 capsule (300 mg total) by mouth at bedtime. Nerve pain 30 capsule 3  . levothyroxine (SYNTHROID, LEVOTHROID) 100 MCG tablet Take 100 mcg by mouth daily before breakfast.    . rOPINIRole (REQUIP) 0.5 MG tablet Take 1 tablet (0.5 mg total) by mouth at bedtime. 30 tablet 5  . traMADol (ULTRAM) 50 MG tablet Take 1 tablet (50 mg total) by mouth 2 (two) times daily. 60 tablet 2  . traZODone (DESYREL) 150 MG tablet Take 150 mg by mouth at bedtime.     No facility-administered medications prior to visit.    ROS Review of Systems  Constitutional: Negative for chills, activity change, appetite change, fatigue and unexpected weight change.  HENT: Negative for congestion, mouth sores and sinus pressure.   Eyes: Negative for visual disturbance.  Respiratory: Negative for cough and chest tightness.   Gastrointestinal: Negative for nausea and abdominal pain.  Genitourinary: Negative for  frequency, difficulty urinating and vaginal pain.  Musculoskeletal: Negative for back pain and gait problem.  Skin: Positive for rash. Negative for pallor.  Neurological: Negative for dizziness, tremors, weakness, numbness and headaches.  Psychiatric/Behavioral: Negative for suicidal ideas, confusion, sleep disturbance and decreased concentration. The patient is nervous/anxious.     Objective:  BP 132/96 mmHg  Pulse 102  Ht 5\' 5"  (1.651 m)  Wt 245 lb (111.131 kg)  BMI 40.77 kg/m2  SpO2 95%  BP Readings from Last 3 Encounters:  01/20/15 132/96  12/09/14 130/98  11/28/14 111/68    Wt Readings from Last 3 Encounters:  01/20/15 245 lb (111.131 kg)  12/09/14 252 lb (114.306 kg)  11/28/14 245 lb (111.131 kg)    Physical Exam  Constitutional: She appears well-developed. No distress.  HENT:  Head: Normocephalic.  Right Ear: External ear normal.  Left Ear: External ear normal.  Nose: Nose normal.  Mouth/Throat: Oropharynx is clear and moist.  Eyes: Conjunctivae are normal. Pupils are equal, round, and reactive to light. Right eye exhibits no discharge. Left eye exhibits no discharge.  Neck: Normal range of motion. Neck supple. No JVD present. No tracheal deviation present. No thyromegaly present.  Cardiovascular: Normal rate, regular rhythm and normal heart sounds.   Pulmonary/Chest: No stridor. No respiratory distress. She has no wheezes.  Abdominal: Soft. Bowel sounds are normal. She exhibits no distension and no mass. There is  no tenderness. There is no rebound and no guarding.  Musculoskeletal: She exhibits no edema or tenderness.  Lymphadenopathy:    She has no cervical adenopathy.  Neurological: She displays normal reflexes. No cranial nerve deficit. She exhibits normal muscle tone. Coordination normal.  Skin: Rash noted. There is erythema.  Psychiatric: She has a normal mood and affect. Her behavior is normal. Judgment and thought content normal.  Flushed V neck and face -  maculo-papular rash L outer dist shin w/eryth papules and excoriations  Lab Results  Component Value Date   WBC 8.8 12/09/2014   HGB 15.0 12/09/2014   HCT 45.0 12/09/2014   PLT 364.0 12/09/2014   GLUCOSE 88 12/09/2014   CHOL 184 12/09/2014   TRIG 112.0 12/09/2014   HDL 61.40 12/09/2014   LDLCALC 100* 12/09/2014   ALT 18 12/09/2014   AST 17 12/09/2014   NA 140 12/09/2014   K 4.0 12/09/2014   CL 105 12/09/2014   CREATININE 0.64 12/09/2014   BUN 16 12/09/2014   CO2 29 12/09/2014   TSH 0.84 12/09/2014   INR 1.02 11/28/2014    Dg Chest 2 View  11/28/2014   CLINICAL DATA:  Chest pain and shortness of breath for 1 week  EXAM: CHEST  2 VIEW  COMPARISON:  None.  FINDINGS: There is minimal scarring in the left base. Lungs are otherwise clear. Heart size and pulmonary vascularity are normal. No adenopathy. There is degenerative change in the lower thoracic spine.  IMPRESSION: Slight scarring left base.  No edema or consolidation.   Electronically Signed   By: Bretta Bang III M.D.   On: 11/28/2014 13:20   Ct Angio Chest Pe W/cm &/or Wo Cm  11/28/2014   CLINICAL DATA:  Left leg pain and swelling for 2 days. History of DVT. Shortness of breath for 2-3 days.  EXAM: CT ANGIOGRAPHY CHEST WITH CONTRAST  TECHNIQUE: Multidetector CT imaging of the chest was performed using the standard protocol during bolus administration of intravenous contrast. Multiplanar CT image reconstructions and MIPs were obtained to evaluate the vascular anatomy.  CONTRAST:  OMNIPAQUE IOHEXOL 350 MG/ML SOLN  COMPARISON:  None.  FINDINGS: Chest wall: No breast masses, supraclavicular or axillary lymphadenopathy. Small scattered lymph nodes are noted. The thyroid gland is grossly normal. The bony thorax is intact.  Mediastinum: The heart is mildly enlarged. No pericardial effusion. No mediastinal or hilar mass or adenopathy. There are small scattered lymph nodes noted. The esophagus is grossly normal.  Vascular: The  aorta is normal in caliber. No dissection. The branch vessels are patent.  The pulmonary arterial tree is well opacified. No filling defects to suggest pulmonary embolism.  Lungs/ pleura: Patchy areas of dependent subpleural atelectasis and small blebs at the lung apices. No infiltrates, edema or effusions.  Upper abdomen:  A left adrenal gland adenoma is noted.  Review of the MIP images confirms the above findings.  IMPRESSION: 1. No CT findings for pulmonary embolism. 2. Normal thoracic aorta. 3. Mild cardiac enlargement but no pericardial effusion. 4. No significant pulmonary findings. Patchy areas of dependent atelectasis. 5. Left adrenal gland adenoma.   Electronically Signed   By: Rudie Meyer M.D.   On: 11/28/2014 17:59    Assessment & Plan:   There are no diagnoses linked to this encounter. I have discontinued Ms. Vogt's traZODone, amphetamine-dextroamphetamine, and amphetamine-dextroamphetamine. I have also changed her amphetamine-dextroamphetamine and levothyroxine. Additionally, I am having her start on loratadine, diphenhydrAMINE, and triamcinolone cream. Lastly, I am having  her maintain her ranitidine, aspirin, ALPRAZolam, gabapentin, rOPINIRole, traMADol, Vitamin D3, and Cyanocobalamin.  Meds ordered this encounter  Medications  . loratadine (CLARITIN) 10 MG tablet    Sig: Take 1 tablet (10 mg total) by mouth daily.    Dispense:  100 tablet    Refill:  3  . diphenhydrAMINE (BENADRYL) 25 MG tablet    Sig: Take 1 tablet (25 mg total) by mouth every 6 (six) hours as needed.    Dispense:  100 tablet    Refill:  0  . triamcinolone cream (KENALOG) 0.1 %    Sig: Apply 1 application topically 2 (two) times daily.    Dispense:  160 g    Refill:  3  . DISCONTD: amphetamine-dextroamphetamine (ADDERALL) 10 MG tablet    Sig: Take 1 tablet (10 mg total) by mouth 2 (two) times daily. Please fill on or after 02/08/15    Dispense:  60 tablet    Refill:  0  . DISCONTD:  amphetamine-dextroamphetamine (ADDERALL) 10 MG tablet    Sig: Take 1 tablet (10 mg total) by mouth 2 (two) times daily. Please fill on or after 03/11/15    Dispense:  60 tablet    Refill:  0  . amphetamine-dextroamphetamine (ADDERALL) 10 MG tablet    Sig: Take 1 tablet (10 mg total) by mouth 2 (two) times daily. Please fill on or after 04/10/15    Dispense:  60 tablet    Refill:  0  . ALPRAZolam (XANAX) 1 MG tablet    Sig: Take 1 tablet (1 mg total) by mouth at bedtime as needed for anxiety.    Dispense:  90 tablet    Refill:  1  . gabapentin (NEURONTIN) 300 MG capsule    Sig: Take 1 capsule (300 mg total) by mouth at bedtime. Nerve pain    Dispense:  90 capsule    Refill:  3  . levothyroxine (SYNTHROID, LEVOTHROID) 100 MCG tablet    Sig: Take 1 tablet (100 mcg total) by mouth daily before breakfast.    Dispense:  100 tablet    Refill:  3  . rOPINIRole (REQUIP) 0.5 MG tablet    Sig: Take 1 tablet (0.5 mg total) by mouth at bedtime.    Dispense:  30 tablet    Refill:  5  . traMADol (ULTRAM) 50 MG tablet    Sig: Take 1 tablet (50 mg total) by mouth 2 (two) times daily.    Dispense:  60 tablet    Refill:  2  . Cholecalciferol (VITAMIN D3) 2000 UNITS capsule    Sig: Take 1 capsule (2,000 Units total) by mouth daily.    Dispense:  100 capsule    Refill:  3  . Cyanocobalamin 1000 MCG SUBL    Sig: Place 1 tablet (1,000 mcg total) under the tongue daily.    Dispense:  100 tablet    Refill:  3     Follow-up: No Follow-up on file.  Sonda PrimesAlex Plotnikov, MD

## 2015-01-20 NOTE — Patient Instructions (Signed)
Black cohosh for hot flashes 

## 2015-01-22 ENCOUNTER — Encounter: Payer: Self-pay | Admitting: Internal Medicine

## 2015-01-22 DIAGNOSIS — F988 Other specified behavioral and emotional disorders with onset usually occurring in childhood and adolescence: Secondary | ICD-10-CM | POA: Insufficient documentation

## 2015-01-22 DIAGNOSIS — R21 Rash and other nonspecific skin eruption: Secondary | ICD-10-CM | POA: Insufficient documentation

## 2015-01-22 NOTE — Assessment & Plan Note (Signed)
Solar exposure rash distribution - possibly lupus related

## 2015-01-22 NOTE — Assessment & Plan Note (Signed)
7/16 Solar exposure distribution - possibly lupus related R/o drug reaction Steroid cream, Claritin, sun screen Hold meds one by one

## 2015-01-22 NOTE — Assessment & Plan Note (Signed)
Wt Readings from Last 3 Encounters:  01/20/15 245 lb (111.131 kg)  12/09/14 252 lb (114.306 kg)  11/28/14 245 lb (111.131 kg)  better

## 2015-01-22 NOTE — Assessment & Plan Note (Signed)
Chronic  On Adderall  Potential benefits of a long term amphetamines  use as well as potential risks  and complications were explained to the patient and were aknowledged. Monitor HR

## 2015-01-28 ENCOUNTER — Institutional Professional Consult (permissible substitution): Payer: BLUE CROSS/BLUE SHIELD | Admitting: Neurology

## 2015-01-29 ENCOUNTER — Telehealth: Payer: Self-pay | Admitting: Internal Medicine

## 2015-01-29 NOTE — Telephone Encounter (Signed)
Adderall was given RX on paper through 10/16 OK to ref Tramadol, Gabapentin Thx

## 2015-01-29 NOTE — Telephone Encounter (Signed)
Courtney From [facility] stated that patient need refills of medications. Please advise [phone]      amphetamine-dextroamphetamine (ADDERALL) 10 MG tablet   gabapentin (NEURONTIN) 300 MG capsule   traMADol (ULTRAM) 50 MG tablet   gabapentin (NEURONTIN) 300 MG capsule

## 2015-01-30 ENCOUNTER — Other Ambulatory Visit: Payer: Self-pay

## 2015-01-30 MED ORDER — GABAPENTIN 300 MG PO CAPS: 300.0000 mg | ORAL_CAPSULE | Freq: Every day | ORAL | Status: DC

## 2015-01-30 MED ORDER — TRAMADOL HCL 50 MG PO TABS: 50.0000 mg | ORAL_TABLET | Freq: Two times a day (BID) | ORAL | Status: DC

## 2015-01-30 NOTE — Telephone Encounter (Signed)
Patient is calling regarding these refills. She is leaving on 8/1 for vacation and she was wanting to get her refills early. Pharmacy can't fill them because they have a later date for next refill.  Please advise [facility] and patient

## 2015-01-30 NOTE — Telephone Encounter (Signed)
Done see meds. Per pt- we just need to ok early fills on Alprazolam, gabapentin, tramadol and Adderall. Pharmacy informed ok to fill early this one time per MD since pt is going to be out of town.

## 2015-04-14 ENCOUNTER — Other Ambulatory Visit (INDEPENDENT_AMBULATORY_CARE_PROVIDER_SITE_OTHER): Payer: BLUE CROSS/BLUE SHIELD

## 2015-04-14 ENCOUNTER — Ambulatory Visit (INDEPENDENT_AMBULATORY_CARE_PROVIDER_SITE_OTHER): Payer: BLUE CROSS/BLUE SHIELD | Admitting: Internal Medicine

## 2015-04-14 ENCOUNTER — Encounter: Payer: Self-pay | Admitting: Internal Medicine

## 2015-04-14 VITALS — BP 132/90 | HR 101 | Temp 98.6°F | Wt 243.0 lb

## 2015-04-14 DIAGNOSIS — R590 Localized enlarged lymph nodes: Secondary | ICD-10-CM

## 2015-04-14 DIAGNOSIS — E034 Atrophy of thyroid (acquired): Secondary | ICD-10-CM

## 2015-04-14 DIAGNOSIS — E038 Other specified hypothyroidism: Secondary | ICD-10-CM | POA: Diagnosis not present

## 2015-04-14 DIAGNOSIS — R000 Tachycardia, unspecified: Secondary | ICD-10-CM | POA: Diagnosis not present

## 2015-04-14 DIAGNOSIS — F909 Attention-deficit hyperactivity disorder, unspecified type: Secondary | ICD-10-CM | POA: Diagnosis not present

## 2015-04-14 DIAGNOSIS — F988 Other specified behavioral and emotional disorders with onset usually occurring in childhood and adolescence: Secondary | ICD-10-CM

## 2015-04-14 LAB — BASIC METABOLIC PANEL
BUN: 15 mg/dL (ref 6–23)
CO2: 30 meq/L (ref 19–32)
CREATININE: 0.6 mg/dL (ref 0.40–1.20)
Calcium: 9.9 mg/dL (ref 8.4–10.5)
Chloride: 104 mEq/L (ref 96–112)
GFR: 112.26 mL/min (ref >60.00)
Glucose, Bld: 88 mg/dL (ref 70–99)
POTASSIUM: 4.2 meq/L (ref 3.5–5.1)
Sodium: 140 mEq/L (ref 135–145)

## 2015-04-14 LAB — CBC WITH DIFFERENTIAL/PLATELET
BASOS ABS: 0 10*3/uL (ref 0.0–0.1)
Basophils Relative: 0.6 % (ref 0.0–3.0)
Eosinophils Absolute: 0.3 10*3/uL (ref 0.0–0.7)
Eosinophils Relative: 3.6 % (ref 0.0–5.0)
HEMATOCRIT: 46.7 % — AB (ref 36.0–46.0)
Hemoglobin: 15.6 g/dL — ABNORMAL HIGH (ref 12.0–15.0)
LYMPHS PCT: 21.3 % (ref 12.0–46.0)
Lymphs Abs: 1.8 10*3/uL (ref 0.7–4.0)
MCHC: 33.5 g/dL (ref 30.0–36.0)
MCV: 92.8 fl (ref 78.0–100.0)
MONOS PCT: 7.7 % (ref 3.0–12.0)
Monocytes Absolute: 0.6 10*3/uL (ref 0.1–1.0)
Neutro Abs: 5.6 10*3/uL (ref 1.4–7.7)
Neutrophils Relative %: 66.8 % (ref 43.0–77.0)
Platelets: 397 10*3/uL (ref 150.0–400.0)
RBC: 5.03 Mil/uL (ref 3.87–5.11)
RDW: 13.3 % (ref 11.5–15.5)
WBC: 8.4 10*3/uL (ref 4.0–10.5)

## 2015-04-14 MED ORDER — GABAPENTIN 300 MG PO CAPS: 300.0000 mg | ORAL_CAPSULE | Freq: Every day | ORAL | Status: DC

## 2015-04-14 MED ORDER — FLUCONAZOLE 150 MG PO TABS: 150.0000 mg | ORAL_TABLET | Freq: Once | ORAL | Status: DC

## 2015-04-14 MED ORDER — ALPRAZOLAM 1 MG PO TABS: 1.0000 mg | ORAL_TABLET | Freq: Every evening | ORAL | Status: DC | PRN

## 2015-04-14 MED ORDER — AMPHETAMINE-DEXTROAMPHETAMINE 10 MG PO TABS: 10.0000 mg | ORAL_TABLET | Freq: Two times a day (BID) | ORAL | Status: DC

## 2015-04-14 MED ORDER — CEFUROXIME AXETIL 250 MG PO TABS: 250.0000 mg | ORAL_TABLET | Freq: Two times a day (BID) | ORAL | Status: DC

## 2015-04-14 MED ORDER — TRAMADOL HCL 50 MG PO TABS: 50.0000 mg | ORAL_TABLET | Freq: Two times a day (BID) | ORAL | Status: DC

## 2015-04-14 NOTE — Assessment & Plan Note (Signed)
On Levothroid 

## 2015-04-14 NOTE — Assessment & Plan Note (Signed)
Reduce adderall dose Labs

## 2015-04-14 NOTE — Progress Notes (Signed)
Pre visit review using our clinic review tool, if applicable. No additional management support is needed unless otherwise documented below in the visit note. 

## 2015-04-14 NOTE — Progress Notes (Signed)
Subjective:  Patient ID: Tiffany Medina, female    DOB: 04/16/1966  Age: 49 y.o. MRN: [id number]  CC: No chief complaint on file.   HPI Tiffany Medina presents for ADD, B12 def, Vit D def (rash from Vit D). C/o R neck glands painful swelling x 3 wks  Outpatient Prescriptions Prior to Visit  Medication Sig Dispense Refill  . aspirin 325 MG tablet Take 325 mg by mouth 2 (two) times daily.     . Cyanocobalamin 1000 MCG SUBL Place 1 tablet (1,000 mcg total) under the tongue daily. 100 tablet 3  . diphenhydrAMINE (BENADRYL) 25 MG tablet Take 1 tablet (25 mg total) by mouth every 6 (six) hours as needed. 100 tablet 0  . levothyroxine (SYNTHROID, LEVOTHROID) 100 MCG tablet Take 1 tablet (100 mcg total) by mouth daily before breakfast. 100 tablet 3  . loratadine (CLARITIN) 10 MG tablet Take 1 tablet (10 mg total) by mouth daily. 100 tablet 3  . ranitidine (ZANTAC) 150 MG tablet Take 150 mg by mouth daily.    Marland Kitchen rOPINIRole (REQUIP) 0.5 MG tablet Take 1 tablet (0.5 mg total) by mouth at bedtime. 30 tablet 5  . triamcinolone cream (KENALOG) 0.1 % Apply 1 application topically 2 (two) times daily. 160 g 3  . ALPRAZolam (XANAX) 1 MG tablet Take 1 tablet (1 mg total) by mouth at bedtime as needed for anxiety. 90 tablet 1  . amphetamine-dextroamphetamine (ADDERALL) 10 MG tablet Take 1 tablet (10 mg total) by mouth 2 (two) times daily. Please fill on or after 04/10/15 60 tablet 0  . gabapentin (NEURONTIN) 300 MG capsule Take 1 capsule (300 mg total) by mouth at bedtime. Nerve pain 90 capsule 0  . traMADol (ULTRAM) 50 MG tablet Take 1 tablet (50 mg total) by mouth 2 (two) times daily. 60 tablet 0  . Cholecalciferol (VITAMIN D3) 2000 UNITS capsule Take 1 capsule (2,000 Units total) by mouth daily. (Patient not taking: Reported on 04/14/2015) 100 capsule 3   No facility-administered medications prior to visit.    ROS Review of Systems  Constitutional: Negative for chills, activity change, appetite change,  fatigue and unexpected weight change.  HENT: Positive for sore throat. Negative for congestion, mouth sores and sinus pressure.   Eyes: Negative for visual disturbance.  Respiratory: Negative for cough and chest tightness.   Gastrointestinal: Negative for nausea and abdominal pain.  Genitourinary: Negative for frequency, difficulty urinating and vaginal pain.  Musculoskeletal: Negative for back pain and gait problem.  Skin: Positive for color change and rash. Negative for pallor.  Neurological: Negative for dizziness, tremors, weakness, numbness and headaches.  Psychiatric/Behavioral: Negative for suicidal ideas, confusion and sleep disturbance. The patient is not nervous/anxious.   face, neck is flushed R LNs are tender and swollen at the mandib angle and along SKM muscle  Objective:  BP 132/90 mmHg  Pulse 101  Temp(Src) 98.6 F (37 C) (Oral)  Wt 243 lb (110.224 kg)  SpO2 95%  BP Readings from Last 3 Encounters:  04/14/15 132/90  01/20/15 132/96  12/09/14 130/98    Wt Readings from Last 3 Encounters:  04/14/15 243 lb (110.224 kg)  01/20/15 245 lb (111.131 kg)  12/09/14 252 lb (114.306 kg)    Physical Exam  Constitutional: She appears well-developed. No distress.  HENT:  Head: Normocephalic.  Right Ear: External ear normal.  Left Ear: External ear normal.  Nose: Nose normal.  Mouth/Throat: Oropharynx is clear and moist.  Eyes: Conjunctivae are normal. Pupils are equal, round,  and reactive to light. Right eye exhibits no discharge. Left eye exhibits no discharge.  Neck: Normal range of motion. Neck supple. No JVD present. No tracheal deviation present. No thyromegaly present.  Cardiovascular: Normal rate, regular rhythm and normal heart sounds.   Pulmonary/Chest: No stridor. No respiratory distress. She has no wheezes.  Abdominal: Soft. Bowel sounds are normal. She exhibits no distension and no mass. There is no tenderness. There is no rebound and no guarding.    Musculoskeletal: She exhibits tenderness. She exhibits no edema.  Lymphadenopathy:    She has no cervical adenopathy.  Neurological: She displays normal reflexes. No cranial nerve deficit. She exhibits normal muscle tone. Coordination normal.  Skin: Rash noted. There is erythema.  Psychiatric: She has a normal mood and affect. Her behavior is normal. Judgment and thought content normal.  Flushed face, chest Swollen glands - R mandible corner of upper lat distal neck  Lab Results  Component Value Date   WBC 8.4 04/14/2015   HGB 15.6* 04/14/2015   HCT 46.7* 04/14/2015   PLT 397.0 04/14/2015   GLUCOSE 88 04/14/2015   CHOL 184 12/09/2014   TRIG 112.0 12/09/2014   HDL 61.40 12/09/2014   LDLCALC 100* 12/09/2014   ALT 18 12/09/2014   AST 17 12/09/2014   NA 140 04/14/2015   K 4.2 04/14/2015   CL 104 04/14/2015   CREATININE 0.60 04/14/2015   BUN 15 04/14/2015   CO2 30 04/14/2015   TSH 0.84 12/09/2014   INR 1.02 11/28/2014    Dg Chest 2 View  11/28/2014   CLINICAL DATA:  Chest pain and shortness of breath for 1 week  EXAM: CHEST  2 VIEW  COMPARISON:  None.  FINDINGS: There is minimal scarring in the left base. Lungs are otherwise clear. Heart size and pulmonary vascularity are normal. No adenopathy. There is degenerative change in the lower thoracic spine.  IMPRESSION: Slight scarring left base.  No edema or consolidation.   Electronically Signed   By: Bretta Bang III M.D.   On: 11/28/2014 13:20   Ct Angio Chest Pe W/cm &/or Wo Cm  11/28/2014   CLINICAL DATA:  Left leg pain and swelling for 2 days. History of DVT. Shortness of breath for 2-3 days.  EXAM: CT ANGIOGRAPHY CHEST WITH CONTRAST  TECHNIQUE: Multidetector CT imaging of the chest was performed using the standard protocol during bolus administration of intravenous contrast. Multiplanar CT image reconstructions and MIPs were obtained to evaluate the vascular anatomy.  CONTRAST:  OMNIPAQUE IOHEXOL 350 MG/ML SOLN   COMPARISON:  None.  FINDINGS: Chest wall: No breast masses, supraclavicular or axillary lymphadenopathy. Small scattered lymph nodes are noted. The thyroid gland is grossly normal. The bony thorax is intact.  Mediastinum: The heart is mildly enlarged. No pericardial effusion. No mediastinal or hilar mass or adenopathy. There are small scattered lymph nodes noted. The esophagus is grossly normal.  Vascular: The aorta is normal in caliber. No dissection. The branch vessels are patent.  The pulmonary arterial tree is well opacified. No filling defects to suggest pulmonary embolism.  Lungs/ pleura: Patchy areas of dependent subpleural atelectasis and small blebs at the lung apices. No infiltrates, edema or effusions.  Upper abdomen:  A left adrenal gland adenoma is noted.  Review of the MIP images confirms the above findings.  IMPRESSION: 1. No CT findings for pulmonary embolism. 2. Normal thoracic aorta. 3. Mild cardiac enlargement but no pericardial effusion. 4. No significant pulmonary findings. Patchy areas of dependent atelectasis.  5. Left adrenal gland adenoma.   Electronically Signed   By: Rudie Meyer M.D.   On: 11/28/2014 17:59    Assessment & Plan:   Diagnoses and all orders for this visit:  Cervical adenopathy -     CBC with Differential/Platelet; Future -     Basic metabolic panel; Future  ADD (attention deficit disorder)  Hypothyroidism due to acquired atrophy of thyroid  Tachycardia  Other orders -     ALPRAZolam (XANAX) 1 MG tablet; Take 1 tablet (1 mg total) by mouth at bedtime as needed for anxiety. -     Discontinue: amphetamine-dextroamphetamine (ADDERALL) 10 MG tablet; Take 1 tablet (10 mg total) by mouth 2 (two) times daily. Please fill on or after 05/11/15 -     gabapentin (NEURONTIN) 300 MG capsule; Take 1 capsule (300 mg total) by mouth at bedtime. Nerve pain -     traMADol (ULTRAM) 50 MG tablet; Take 1 tablet (50 mg total) by mouth 2 (two) times daily. -     cefUROXime  (CEFTIN) 250 MG tablet; Take 1 tablet (250 mg total) by mouth 2 (two) times daily. -     amphetamine-dextroamphetamine (ADDERALL) 10 MG tablet; Take 1 tablet (10 mg total) by mouth 2 (two) times daily. Please fill on or after 06/10/15 -     fluconazole (DIFLUCAN) 150 MG tablet; Take 1 tablet (150 mg total) by mouth once. May Repeat in 2 days if needed.   I have discontinued Tiffany Medina's amphetamine-dextroamphetamine and Vitamin D3. I have also changed her amphetamine-dextroamphetamine. Additionally, I am having her start on cefUROXime and fluconazole. Lastly, I am having her maintain her ranitidine, aspirin, loratadine, diphenhydrAMINE, triamcinolone cream, levothyroxine, rOPINIRole, Cyanocobalamin, ALPRAZolam, gabapentin, and traMADol.  Meds ordered this encounter  Medications  . ALPRAZolam (XANAX) 1 MG tablet    Sig: Take 1 tablet (1 mg total) by mouth at bedtime as needed for anxiety.    Dispense:  90 tablet    Refill:  1  . DISCONTD: amphetamine-dextroamphetamine (ADDERALL) 10 MG tablet    Sig: Take 1 tablet (10 mg total) by mouth 2 (two) times daily. Please fill on or after 05/11/15    Dispense:  60 tablet    Refill:  0  . gabapentin (NEURONTIN) 300 MG capsule    Sig: Take 1 capsule (300 mg total) by mouth at bedtime. Nerve pain    Dispense:  90 capsule    Refill:  5  . traMADol (ULTRAM) 50 MG tablet    Sig: Take 1 tablet (50 mg total) by mouth 2 (two) times daily.    Dispense:  60 tablet    Refill:  1  . cefUROXime (CEFTIN) 250 MG tablet    Sig: Take 1 tablet (250 mg total) by mouth 2 (two) times daily.    Dispense:  20 tablet    Refill:  0  . amphetamine-dextroamphetamine (ADDERALL) 10 MG tablet    Sig: Take 1 tablet (10 mg total) by mouth 2 (two) times daily. Please fill on or after 06/10/15    Dispense:  60 tablet    Refill:  0  . fluconazole (DIFLUCAN) 150 MG tablet    Sig: Take 1 tablet (150 mg total) by mouth once. May Repeat in 2 days if needed.    Dispense:  2 tablet     Refill:  0     Follow-up: Return in about 2 months (around 06/14/2015) for a follow-up visit.  Sonda Primes, MD

## 2015-04-14 NOTE — Assessment & Plan Note (Signed)
Chronic  On Adderall  Potential benefits of a long term amphetamines  use as well as potential risks  and complications were explained to the patient and were aknowledged. Monitor HR 

## 2015-04-14 NOTE — Assessment & Plan Note (Signed)
10/16 R LNs are tender and swollen at the mandib angle and along SKM muscle -- ?etiology CBC PO abx Dental appt

## 2015-04-21 ENCOUNTER — Ambulatory Visit: Payer: Self-pay | Admitting: Internal Medicine

## 2015-04-21 ENCOUNTER — Ambulatory Visit
Admission: RE | Admit: 2015-04-21 | Discharge: 2015-04-21 | Disposition: A | Discharge status: Home / Self Care | Payer: BLUE CROSS/BLUE SHIELD | Source: Ambulatory Visit | Attending: Internal Medicine | Admitting: Internal Medicine

## 2015-04-29 ENCOUNTER — Telehealth: Payer: Self-pay | Admitting: Genetic Counselor

## 2015-04-29 NOTE — Telephone Encounter (Signed)
Lt mess for pt regarding genetic counseling

## 2015-05-02 ENCOUNTER — Telehealth: Payer: Self-pay | Admitting: Genetic Counselor

## 2015-05-02 NOTE — Telephone Encounter (Signed)
Lt mess for pt regarding genetic counseling appt.

## 2015-05-06 ENCOUNTER — Telehealth: Payer: Self-pay | Admitting: Genetic Counselor

## 2015-05-06 ENCOUNTER — Ambulatory Visit (INDEPENDENT_AMBULATORY_CARE_PROVIDER_SITE_OTHER): Payer: BLUE CROSS/BLUE SHIELD | Admitting: Neurology

## 2015-05-06 ENCOUNTER — Encounter: Payer: Self-pay | Admitting: Neurology

## 2015-05-06 VITALS — BP 132/82 | HR 90 | Resp 16 | Ht 65.0 in | Wt 245.0 lb

## 2015-05-06 DIAGNOSIS — G4719 Other hypersomnia: Secondary | ICD-10-CM | POA: Diagnosis not present

## 2015-05-06 DIAGNOSIS — G2581 Restless legs syndrome: Secondary | ICD-10-CM | POA: Diagnosis not present

## 2015-05-06 DIAGNOSIS — G4733 Obstructive sleep apnea (adult) (pediatric): Secondary | ICD-10-CM | POA: Diagnosis not present

## 2015-05-06 DIAGNOSIS — R351 Nocturia: Secondary | ICD-10-CM | POA: Diagnosis not present

## 2015-05-06 NOTE — Patient Instructions (Addendum)
Based on your symptoms and your exam I believe you are at risk for obstructive sleep apnea or OSA, and I think we should proceed with a sleep study to determine whether you do or do not have OSA and how severe it is. If you have more than mild OSA, I want you to consider treatment with CPAP. Please remember, the risks and ramifications of moderate to severe obstructive sleep apnea or OSA are: Cardiovascular disease, including congestive heart failure, stroke, difficult to control hypertension, arrhythmias, and even type 2 diabetes has been linked to untreated OSA. Sleep apnea causes disruption of sleep and sleep deprivation in most cases, which, in turn, can cause recurrent headaches, problems with memory, mood, concentration, focus, and vigilance. Most people with untreated sleep apnea report excessive daytime sleepiness, which can affect their ability to drive. Please do not drive if you feel sleepy.   I will likely see you back after your sleep study to go over the test results and where to go from there. We will call you after your sleep study to advise about the results (most likely, you will hear from Diana, my nurse) and to set up an appointment at the time, as necessary.    Our sleep lab administrative assistant, Dawn will meet with you or call you to schedule your sleep study. If you don't hear back from her by next week please feel free to call her at [phone]. This is her direct line and please leave a message with your phone number to call back if you get the voicemail box. She will call back as soon as possible.   Please stop smoking!  

## 2015-05-06 NOTE — Telephone Encounter (Signed)
Cancelled referral due to no response from pt regarding scheduling genetic counseling appt.

## 2015-05-06 NOTE — Progress Notes (Signed)
Subjective:    Patient ID: Tiffany Medina is a 49 y.o. female.  HPI     Huston Foley, MD, PhD [facility] [address] [address] [place], [place] [address]  Dear Dr. Posey Rea,   I saw your patient, Tiffany Medina, upon your kind request in my c neurologic clinic today for initial consultation of her sleep disorder, in particular, concern for underlying obstructive sleep apnea. The patient is unaccompanied today. As you know, Tiffany Medina is a 49 year old right-handed woman with an underlying medical history of ADD, hypothyroidism, restless leg syndrome, sciatica, history of DVTs (on ASA 650 mg daily, previously on coumadin), discoid lupus, anxiety disorder, and severe obesity, who reports snoring and excessive daytime somnolence as well as a prior diagnosis of OSA. She reports having had a sleep study in 1999. She was started on CPAP therapy but only used it for a few days. Prior sleep test results are not available for my review. She would be willing to have another sleep study and try CPAP again. For restless leg syndrome she takes ropinirole at night. Of note, she does take other potentially sedating medications, including gabapentin, tramadol, and Xanax. She takes Adderall for ADD. I reviewed your office note from 04/14/2015. Of note, the patient canceled the appointment previously for 01/28/2015. She has been on Requip 0.5 mg qHS with good success. She has an ESS is 17/24 and fatigue score was 58/63. She has a bedtime of around 10 PM typically. It may take her up to 30 minutes to fall asleep. She takes Xanax 1 mg at night. She also takes gabapentin at night. She has a history of recurrent DVTs. She has superficial clots in her left leg. She had vein stripping in the past. She has not recently seen a pain or vascular specialist. She has gained weight since her sleep study in 1999. She smokes a pack a day. She does not drink alcohol or use illicit drugs. She drinks  quite a bit of caffeine in the form of sodas, 2-3 bottles of [employer], 20 pounds each, as well as 2 cups of coffee per day. 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 Rise time is usually 5:30 AM and she does not wake up rested. She has nocturia 3-4 times on an average night. She has numbness of her left leg. She has pain over left leg. She has not seen a rheumatologist in quite some time. She used to see hematology in the past. She has no family history of obstructive sleep apnea.  Her Past Medical History Is Significant For: Past Medical History  Diagnosis Date  . DVT (deep venous thrombosis) (HCC)   . Lupus (HCC)   . Asthma   . Sleep apnea   . Discoid lupus   . Depression   . Anxiety   . GERD (gastroesophageal reflux disease)     Her Past Surgical History Is Significant For: Past Surgical History  Procedure Laterality Date  . Abdominal hysterectomy    . Vein ligation and stripping    . Appendectomy    . Tubal ligation  1987    Her Family History Is Significant For: Family History  Problem Relation Age of Onset  . Breast cancer Mother   . Cancer Mother 51    breast  . Cancer Father 67    lung ca  . Cancer Other   . Diabetes  Other   . Mental illness Other   . Cancer Sister 16    lung ca  . Cancer Brother 39    ETOH seizures  . Lupus Maternal Uncle     Her Social History Is Significant For: Social History   Social History  . Marital Status: Married    Spouse Name: N/A  . Number of Children: 1  . Years of Education: College   Occupational History  . 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 Smoking status: Current Every Day Smoker -- 1.00 packs/day    Types: Cigarettes  . Smokeless tobacco: None  . Alcohol Use: No  . Drug Use: No  . Sexual Activity: Yes   Other Topics Concern  . None   Social History Narrative   Drinks 2 cups of coffee a day and 2 Dt Mt Dews    Her  Allergies Are:  Allergies  Allergen Reactions  . Vit D-Vit E-Safflower Oil Hives    rash  :   Her Current Medications Are:  Outpatient Encounter Prescriptions as of 05/06/2015  Medication Sig  . ALPRAZolam (XANAX) 1 MG tablet Take 1 tablet (1 mg total) by mouth at bedtime as needed for anxiety.  Medina Kitchen amphetamine-dextroamphetamine (ADDERALL) 10 MG tablet Take 1 tablet (10 mg total) by mouth 2 (two) times daily. Please fill on or after 06/10/15  . aspirin 325 MG tablet Take 325 mg by mouth 2 (two) times daily.   . Cyanocobalamin 1000 MCG SUBL Place 1 tablet (1,000 mcg total) under the tongue daily.  . diphenhydrAMINE (BENADRYL) 25 MG tablet Take 1 tablet (25 mg total) by mouth every 6 (six) hours as needed.  . gabapentin (NEURONTIN) 300 MG capsule Take 1 capsule (300 mg total) by mouth at bedtime. Nerve pain  . levothyroxine (SYNTHROID, LEVOTHROID) 100 MCG tablet Take 1 tablet (100 mcg total) by mouth daily before breakfast.  . loratadine (CLARITIN) 10 MG tablet Take 1 tablet (10 mg total) by mouth daily.  . ranitidine (ZANTAC) 150 MG tablet Take 150 mg by mouth daily.  Medina Kitchen rOPINIRole (REQUIP) 0.5 MG tablet Take 1 tablet (0.5 mg total) by mouth at bedtime.  . traMADol (ULTRAM) 50 MG tablet Take 1 tablet (50 mg total) by mouth 2 (two) times daily.  Medina Kitchen triamcinolone cream (KENALOG) 0.1 % Apply 1 application topically 2 (two) times daily.  . [DISCONTINUED] cefUROXime (CEFTIN) 250 MG tablet Take 1 tablet (250 mg total) by mouth 2 (two) times daily.  . [DISCONTINUED] fluconazole (DIFLUCAN) 150 MG tablet Take 1 tablet (150 mg total) by mouth once. May Repeat in 2 days if needed.   No facility-administered encounter medications on file as of 05/06/2015.  :  Review of Systems:  Out of a complete 14 point review of systems, all are reviewed and negative with the exception of these symptoms as listed below:   Review of Systems  Constitutional: Positive for fatigue.       Weight gain   Cardiovascular:  Positive for leg swelling.  Musculoskeletal:       Joint pain, joint swelling, cramps  Neurological: Positive for weakness, numbness and headaches.       Had sleep study back in 1999, was diagnosed with sleep apnea and prescribed CPAP. Only used a couple of nights.  Shift work, Has trouble falling and staying asleep, gets up 2-3 times a night, snoring, memory loss, restless legs, wakes up choking at night, wakes up in the morning  feeling tired, daytime tiredness, denies taking naps, morning headaches.   Hematological: Bruises/bleeds easily.  Psychiatric/Behavioral:       Depression, anxiety, not enough sleep, decreased energy, change in appetite   Epworth Sleepiness Scale 0= would never doze 1= slight chance of dozing 2= moderate chance of dozing 3= high chance of dozing  Sitting and reading:2 Watching TV:3 Sitting inactive in a public place (ex. Theater or meeting):2 As a passenger in a car for an hour without a break:3 Lying down to rest in the afternoon:2 Sitting and talking to someone:2 Sitting quietly after lunch (no alcohol):2 In a car, while stopped in traffic:1 Total:17 Objective:  Neurologic Exam  Physical Exam Physical Examination:   Filed Vitals:   05/06/15 1444  BP: 132/82  Pulse: 90  Resp: 16   General Examination: The patient is a very pleasant 49 y.o. female in no acute distress. She appears well-developed and well-nourished and well groomed.   HEENT: Normocephalic, atraumatic, pupils are equal, round and reactive to light and accommodation. Funduscopic exam is normal with sharp disc margins noted. Extraocular tracking is good without limitation to gaze excursion or nystagmus noted. Normal smooth pursuit is noted. Hearing is grossly intact. Tympanic membranes are clear bilaterally. Face is symmetric with normal facial animation and normal facial sensation. Speech is clear with no dysarthria noted. There is no hypophonia. There is no lip, neck/head, jaw or voice  tremor. Neck is supple with full range of passive and active motion. There are no carotid bruits on auscultation. Oropharynx exam reveals: mild mouth dryness, adequate dental hygiene and moderate airway crowding, due to elongated uvula, redundant soft palate, narrow airway entry, tonsils in place, bilaterally 1+. Mallampati is class II. Tongue protrudes centrally and palate elevates symmetrically. neck circumference is 15-1/2 inches.    Chest: Clear to auscultation without wheezing, rhonchi or crackles noted.  Heart: S1+S2+0, regular and normal without murmurs, rubs or gallops noted.   Abdomen: Soft, non-tender and non-distended with normal bowel sounds appreciated on auscultation.  Extremities: There is 1+ pitting edema in the distal lower extremities bilaterally, left more than right. She has varicose veins on the left. She has an area of redness in the left medial thigh. Pedal pulses are intact.  Skin: Warm and dry without trophic changes noted.   Musculoskeletal: exam reveals no obvious joint deformities, tenderness or joint swelling or erythema.   Neurologically:  Mental status: The patient is awake, alert and oriented in all 4 spheres. His immediate and remote memory, attention, language skills and fund of knowledge are appropriate. There is no evidence of aphasia, agnosia, apraxia or anomia. Speech is clear with normal prosody and enunciation. Thought process is linear. Mood is normal and affect is normal.  Cranial nerves II - XII are as described above under HEENT exam. In addition: shoulder shrug is normal with equal shoulder height noted. Motor exam: Normal bulk, strength and tone is noted. There is no drift, tremor or rebound. Romberg is negative. Reflexes are 2+ in the upper extremities and 1+ in the lower extremities Babinski: Toes are flexor bilaterally. Fine motor skills and coordination: intact with normal finger taps, normal hand movements, normal rapid alternating patting, normal  foot taps and normal foot agility.  Cerebellar testing: No dysmetria or intention tremor on finger to nose testing. Heel to shin is unremarkable bilaterally. There is no truncal or gait ataxia.  Sensory exam: intact to light touch, pinprick, vibration, temperature sense in the upper and lower extremities, with the exception of  decreased sensation to all modalities in the left foot and distal left leg.   Gait, station and balance: She stands with difficulty. No veering to one side is noted. No leaning to one side is noted. Posture is age-appropriate and stance is narrow based. Gait shows normal stride length and normal pace. No problems turning are noted. She turns en bloc. Tandem walk is difficult for her.      Assessment and Plan:   In summary, [name] is a very pleasant 49 y.o.-year old female with an underlying medical history of ADD, hypothyroidism, restless leg syndrome, sciatica, history of DVTs (on ASA 650 mg daily, previously on coumadin), discoid lupus, anxiety disorder, and severe obesity, who has a prior diagnosis of OSA. Her history and physical exam are in keeping with this. She has in addition restless leg symptoms but these are controlled on a reasonable dose of Requip at this time. She reports problems with her left leg including superficial blood clots. She is advised to talk to you about this. She may benefit from seeing a vascular/vein specialist.  I had a long chat with the patient about my findings and the diagnosis of OSA, its prognosis and treatment options. We talked about medical treatments, surgical interventions and non-pharmacological approaches. I explained in particular the risks and ramifications of untreated moderate to severe OSA, especially with respect to developing cardiovascular disease down the Road, including congestive heart failure, difficult to treat hypertension, cardiac arrhythmias, or stroke. Even type 2 diabetes has, in part, been linked to untreated OSA.  Symptoms of untreated OSA include daytime sleepiness, memory problems, mood irritability and mood disorder such as depression and anxiety, lack of energy, as well as recurrent headaches, especially morning headaches. We talked about smoking cessation and trying to maintain a healthy lifestyle in general, as well as the importance of weight control. I encouraged the patient to eat healthy, exercise daily and keep well hydrated, to keep a scheduled bedtime and wake time routine, to not skip any meals and eat healthy snacks in between meals. I advised the patient not to drive when feeling sleepy. I recommended the following at this time: sleep study with potential positive airway pressure titration. (We will score hypopneas at 3% and split the sleep study into diagnostic and treatment portion, if the estimated. 2 hour AHI is >15/h).   I explained the sleep test procedure to the patient and also outlined possible surgical and non-surgical treatment options of OSA, including the use of a custom-made dental device (which would require a referral to a specialist dentist or oral surgeon), upper airway surgical options, such as pillar implants, radiofrequency surgery, tongue base surgery, and UPPP (which would involve a referral to an ENT surgeon). Rarely, jaw surgery such as mandibular advancement may be considered.  I also explained the CPAP treatment option to the patient, who indicated that she would be willing to try CPAP if the need arises. I explained the importance of being compliant with PAP treatment, not only for insurance purposes but primarily to improve Her symptoms, and for the patient's long term health benefit, including to reduce Her cardiovascular risks. I answered all her questions today and the patient was in agreement. I would like to see her back after the sleep study is completed and encouraged her to call with any interim questions, concerns, problems or updates.   Thank you very much for  allowing me to participate in the care of this nice patient. If I can be of any  further assistance to you please do not hesitate to call me at [phone].  Sincerely,   Huston Foley, MD, PhD

## 2015-05-25 ENCOUNTER — Ambulatory Visit (INDEPENDENT_AMBULATORY_CARE_PROVIDER_SITE_OTHER): Payer: BLUE CROSS/BLUE SHIELD | Admitting: Neurology

## 2015-05-25 DIAGNOSIS — G4734 Idiopathic sleep related nonobstructive alveolar hypoventilation: Secondary | ICD-10-CM

## 2015-05-25 DIAGNOSIS — G4733 Obstructive sleep apnea (adult) (pediatric): Secondary | ICD-10-CM

## 2015-05-25 DIAGNOSIS — G479 Sleep disorder, unspecified: Secondary | ICD-10-CM

## 2015-05-26 NOTE — Sleep Study (Signed)
Please see the scanned sleep study interpretation located in the Procedure tab within the Chart Review section. 

## 2015-05-27 ENCOUNTER — Telehealth: Payer: Self-pay | Admitting: Neurology

## 2015-05-27 DIAGNOSIS — G4734 Idiopathic sleep related nonobstructive alveolar hypoventilation: Secondary | ICD-10-CM

## 2015-05-27 DIAGNOSIS — G4733 Obstructive sleep apnea (adult) (pediatric): Secondary | ICD-10-CM

## 2015-05-27 NOTE — Telephone Encounter (Signed)
I spoke to patient and she is aware of results and recommendations. She would like to proceed with treatment. She is aware of ONO orders as well. I will refer to [facility] in [place]. I will send report to PCP. I will also send patient a letter reminding her to make f/u and stress importance of compliance.

## 2015-05-27 NOTE — Telephone Encounter (Signed)
Diana:  Patient referred by Dr. Posey ReaPlotnikov, seen by me on 05/06/15, split study on 05/25/15, Ins: [employer]. Please call and notify patient that the recent sleep study confirmed the diagnosis of severe OSA. She did very well with CPAP during the study with significant improvement of the respiratory events. Therefore, I would like start the patient on CPAP therapy at home by prescribing a machine for home use. I placed the order in the chart. The patient will need a follow up appointment with me in 8 to 10 weeks post set up that has to be scheduled; please go ahead and schedule while you have the patient on the phone and make sure patient understands the importance of keeping this window for the FU appointment, as it is often an insurance requirement and failing to adhere to this may result in losing coverage for sleep apnea treatment.  Please re-enforce the importance of compliance with treatment and the need for us to monitor compliance data - again an insurance requirement and good feedback for the patient as far as how they are doing.  Also remind patient, that any upcoming CPAP machine or mask issues, should be first addressed with the DME company. Please ask if patient has a preference regarding DME company.  Please arrange for CPAP set up at home through a DME company of patient's choice - once you have spoken to the patient - and faxed/routed report to PCP and referring MD (if other than PCP), you can close this encounter, thanks,   Huston FoleySaima Gisela Lea, MD, PhD [facility])

## 2015-06-02 ENCOUNTER — Encounter: Payer: Self-pay | Admitting: Genetic Counselor

## 2015-06-02 ENCOUNTER — Ambulatory Visit (HOSPITAL_BASED_OUTPATIENT_CLINIC_OR_DEPARTMENT_OTHER): Payer: BLUE CROSS/BLUE SHIELD | Admitting: Genetic Counselor

## 2015-06-02 ENCOUNTER — Other Ambulatory Visit: Payer: BLUE CROSS/BLUE SHIELD

## 2015-06-02 DIAGNOSIS — Z803 Family history of malignant neoplasm of breast: Secondary | ICD-10-CM | POA: Diagnosis not present

## 2015-06-02 DIAGNOSIS — Z800 Family history of malignant neoplasm of digestive organs: Secondary | ICD-10-CM | POA: Insufficient documentation

## 2015-06-02 NOTE — Telephone Encounter (Signed)
I spoke to patient and advised her that I had confirmation from [place] [facility] on 11/23, that they have received the order. I let the patient know that I will reach [facility].

## 2015-06-02 NOTE — Progress Notes (Signed)
REFERRING PROVIDER: Cassandria Anger, MD [place], [place] [address]   Louretta Shorten, MD  PRIMARY PROVIDER:  Walker Kehr, MD  PRIMARY REASON FOR VISIT:  1. Family history of colon cancer   2. Family history of breast cancer      HISTORY OF PRESENT ILLNESS:   Tiffany Medina, a 49 y.o. female, was seen for a [facility] cancer genetics consultation at the request of Dr. Corinna Capra due to a family history of cancer.  Tiffany Medina presents to clinic today to discuss the possibility of a hereditary predisposition to cancer, genetic testing, and to further clarify her future cancer risks, as well as potential cancer risks for family members. Tiffany Medina is a 49 y.o. female with no personal history of cancer.    CANCER HISTORY:   No history exists.     HORMONAL RISK FACTORS:  Menarche was at age 86.  First live birth at age 29.  OCP use for approximately 3-4 years.  Ovaries intact: left ovary and tube intact.  Hysterectomy: yes.  Menopausal status: postmenopausal.  HRT use: 0 years. Colonoscopy: no; not examined. Mammogram within the last year: yes. Number of breast biopsies: 0. Up to date with pelvic exams:  yes. Any excessive radiation exposure in the past:  no  Past Medical History  Diagnosis Date  . DVT (deep venous thrombosis) ([facility])   . Lupus ([facility])   . Asthma   . Sleep apnea   . Discoid lupus   . Depression   . Anxiety   . GERD (gastroesophageal reflux disease)   . Family history of colon cancer   . Family history of breast cancer     Past Surgical History  Procedure Laterality Date  . Abdominal hysterectomy    . Vein ligation and stripping    . Appendectomy    . Tubal ligation  1987    Social History   Social History  . Marital Status: Married    Spouse Name: N/A  . Number of Children: 1  . Years of Education: College   Occupational History  . 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 Smoking status: Current Every Day Smoker -- 1.00  packs/day    Types: Cigarettes  . Smokeless tobacco: None  . Alcohol Use: No  . Drug Use: No  . Sexual Activity: Yes   Other Topics Concern  . None   Social History Narrative   Drinks 2 cups of coffee a day and 2 Dt Mt Dews     FAMILY HISTORY:  We obtained a detailed, 4-generation family history.  Significant diagnoses are listed below: Family History  Problem Relation Age of Onset  . Breast cancer Mother 35    bilateral breast cancer  . Hodgkin's lymphoma Father     dx in his 79s  . Lung cancer Father 37  . Cancer Other   . Diabetes Other   . Mental illness Other   . Lung cancer Sister 84    adenocarcinoma (also a smoker)  . Lupus Maternal Uncle   . Colon cancer Maternal Uncle     dx in his 69s  . Cancer Paternal Uncle     2 paternal uncles with cancer NOS  . Diabetes Maternal Grandmother   . Colon cancer Maternal Grandfather     late 30s  . Diabetes Paternal Grandmother   . Colon cancer Paternal Grandfather 81  . Breast cancer Sister 54   The  patient has one daughter, two brothers and two sisters.  One sister was daignosed with breast cancer at 65, and one sister was diagnosed with lung cancer at 39.  Her mother was diagnosed with bilateral breast cancer at 45 and died at 47.  Her mother had several brothers and sisters.  One brother had colon cancer in his 50s.  Her mother's father had colon cancer in his late 80s and her mother had diabetes.  The patient's father had Hodgkin's lymphoma in his early 40s and lung cancer at 74.  He had 8 siblings, two had cancer NOS.  His father had colon cancer in his 60s.  Patient's maternal ancestors are of American Indian (Cre[place] and [place]) and Irish descent, and paternal ancestors are of Scotch-Irish descent. There is no reported Ashkenazi Jewish ancestry. There is no known consanguinity.  GENETIC COUNSELING ASSESSMENT: Tiffany Medina is a 49 y.o. female with a family history of breast cancer and colon cancer which somewhat  suggestive of a hereditary cancer syndrome and predisposition to cancer. We, therefore, discussed and recommended the following at today's visit.   DISCUSSION: We discussed that about 5-10% of breast cancer was due to a hereditary cancer syndrome.  Most commonly these are due to BRCA mutations.  However, with the family history of colon cancer we also discussed CHEK2 mutations.  Other hereditary genes that are relatively commonly see are ATM and PALB2 mutations.  We reviewed the characteristics, features and inheritance patterns of hereditary cancer syndromes. We also discussed genetic testing, including the appropriate family members to test, the process of testing, insurance coverage and turn-around-time for results. We discussed the implications of a negative, positive and/or variant of uncertain significant result. We recommended Tiffany Medina pursue genetic testing for the Breast/Ovarian cancer gene panel. The Breast/Ovarian gene panel offered by [facility] includes sequencing and rearrangement analysis for the following 20 genes:  ATM, BARD1, BRCA1, BRCA2, BRIP1, CDH1, CHEK2, EPCAM, FANCC, MLH1, MSH2, MSH6, NBN, PALB2, PMS2, PTEN, RAD51C, RAD51D, TP53, and XRCC2.     Based on Ms. Laymon's family history of cancer, she meets medical criteria for genetic testing. Despite that she meets criteria, she may still have an out of pocket cost. We discussed that if her out of pocket cost for testing is over $100, the laboratory will call and confirm whether she wants to proceed with testing.  If the out of pocket cost of testing is less than $100 she will be billed by the genetic testing laboratory.   PLAN: After considering the risks, benefits, and limitations, Tiffany Medina  provided informed consent to pursue genetic testing and the blood sample was sent to [facility] for analysis of the Breast/Ovarian cancer panel. Results should be available within approximately 2-3 weeks' time, at which point they will be  disclosed by telephone to Tiffany Medina, as will any additional recommendations warranted by these results. Tiffany Medina will receive a summary of her genetic counseling visit and a copy of her results once available. This information will also be available in Epic. We encouraged Tiffany Medina to remain in contact with cancer genetics annually so that we can continuously update the family history and inform her of any changes in cancer genetics and testing that may be of benefit for her family. Tiffany Medina's questions were answered to her satisfaction today. Our contact information was provided should additional questions or concerns arise.  Based on Tiffany Medina family history, we recommended her sister, who was diagnosed with breast cancer at age 65,   have genetic counseling and testing. Tiffany Medina will let us know if we can be of any assistance in coordinating genetic counseling and/or testing for this family member.   Lastly, we encouraged Tiffany Medina to remain in contact with cancer genetics annually so that we can continuously update the family history and inform her of any changes in cancer genetics and testing that may be of benefit for this family.   Ms.  Medina's questions were answered to her satisfaction today. Our contact information was provided should additional questions or concerns arise. Thank you for the referral and allowing us to share in the care of your patient.   Karen P. Powell, MS, CGC Certified Genetic Counselor [email or web address] phone: [phone]  The patient was seen for a total of 45 minutes in face-to-face genetic counseling.  This patient was discussed with Drs. Magrinat, Gudena and/or Feng who agrees with the above.    _______________________________________________________________________ For Office Staff:  Number of people involved in session: 1 Was an Intern/ student involved with case: no    

## 2015-06-02 NOTE — Telephone Encounter (Signed)
Pt called sts she called [facility] this morning and was told they do not have orders. Please call and advise pt.

## 2015-06-09 ENCOUNTER — Ambulatory Visit (INDEPENDENT_AMBULATORY_CARE_PROVIDER_SITE_OTHER): Payer: BLUE CROSS/BLUE SHIELD | Admitting: Internal Medicine

## 2015-06-09 ENCOUNTER — Encounter: Payer: Self-pay | Admitting: Internal Medicine

## 2015-06-09 VITALS — BP 138/88 | HR 111 | Wt 246.0 lb

## 2015-06-09 DIAGNOSIS — R21 Rash and other nonspecific skin eruption: Secondary | ICD-10-CM | POA: Diagnosis not present

## 2015-06-09 DIAGNOSIS — G4733 Obstructive sleep apnea (adult) (pediatric): Secondary | ICD-10-CM | POA: Diagnosis not present

## 2015-06-09 DIAGNOSIS — L930 Discoid lupus erythematosus: Secondary | ICD-10-CM

## 2015-06-09 DIAGNOSIS — E559 Vitamin D deficiency, unspecified: Secondary | ICD-10-CM | POA: Insufficient documentation

## 2015-06-09 MED ORDER — AMPHETAMINE-DEXTROAMPHETAMINE 10 MG PO TABS: 10.0000 mg | ORAL_TABLET | Freq: Two times a day (BID) | ORAL | Status: DC

## 2015-06-09 MED ORDER — METHYLPREDNISOLONE ACETATE 80 MG/ML IJ SUSP
80.0000 mg | Freq: Once | INTRAMUSCULAR | Status: AC
  Administered 2015-06-09: 80 mg via INTRAMUSCULAR

## 2015-06-09 MED ORDER — ALPRAZOLAM 1 MG PO TABS: 1.0000 mg | ORAL_TABLET | Freq: Every evening | ORAL | Status: DC | PRN

## 2015-06-09 MED ORDER — ROPINIROLE HCL 0.5 MG PO TABS: 0.5000 mg | ORAL_TABLET | Freq: Every day | ORAL | Status: DC

## 2015-06-09 MED ORDER — TRAMADOL HCL 50 MG PO TABS: 50.0000 mg | ORAL_TABLET | Freq: Two times a day (BID) | ORAL | Status: DC

## 2015-06-09 MED ORDER — CALCITRIOL 0.25 MCG PO CAPS: 0.2500 ug | ORAL_CAPSULE | Freq: Every day | ORAL | Status: DC

## 2015-06-09 NOTE — Assessment & Plan Note (Signed)
On CPAP 12/16

## 2015-06-09 NOTE — Assessment & Plan Note (Signed)
Vit D allergic. Nl vit D in 2016 Will try Rocaltrol if needed

## 2015-06-09 NOTE — Progress Notes (Signed)
Pre visit review using our clinic review tool, if applicable. No additional management support is needed unless otherwise documented below in the visit note. 

## 2015-06-09 NOTE — Assessment & Plan Note (Signed)
Worse. Lupus - needs to go on Plaquenil or MTX per dermatology to control rash Stress reduction - work 8h shifts

## 2015-06-09 NOTE — Assessment & Plan Note (Signed)
12/16 Worse  - needs to go on Plaquenil or MTX per dermatology to control rash Stress reduction - work 8h shifts

## 2015-06-09 NOTE — Progress Notes (Signed)
Subjective:  Patient ID: Tiffany Medina, female    DOB: January 03, 1966  Age: 49 y.o. MRN: [id number]  CC: No chief complaint on file.   HPI Tiffany Medina presents for ADD, B12 def, anxiety, LBP f/u. On a CPAP now for OSA. C/o feet pain  Outpatient Prescriptions Prior to Visit  Medication Sig Dispense Refill  . aspirin 325 MG tablet Take 325 mg by mouth 2 (two) times daily.     . Cyanocobalamin 1000 MCG SUBL Place 1 tablet (1,000 mcg total) under the tongue daily. 100 tablet 3  . diphenhydrAMINE (BENADRYL) 25 MG tablet Take 1 tablet (25 mg total) by mouth every 6 (six) hours as needed. 100 tablet 0  . gabapentin (NEURONTIN) 300 MG capsule Take 1 capsule (300 mg total) by mouth at bedtime. Nerve pain 90 capsule 5  . levothyroxine (SYNTHROID, LEVOTHROID) 100 MCG tablet Take 1 tablet (100 mcg total) by mouth daily before breakfast. 100 tablet 3  . loratadine (CLARITIN) 10 MG tablet Take 1 tablet (10 mg total) by mouth daily. 100 tablet 3  . ranitidine (ZANTAC) 150 MG tablet Take 150 mg by mouth daily.    Marland Kitchen triamcinolone cream (KENALOG) 0.1 % Apply 1 application topically 2 (two) times daily. 160 g 3  . ALPRAZolam (XANAX) 1 MG tablet Take 1 tablet (1 mg total) by mouth at bedtime as needed for anxiety. 90 tablet 1  . amphetamine-dextroamphetamine (ADDERALL) 10 MG tablet Take 1 tablet (10 mg total) by mouth 2 (two) times daily. Please fill on or after 06/10/15 60 tablet 0  . rOPINIRole (REQUIP) 0.5 MG tablet Take 1 tablet (0.5 mg total) by mouth at bedtime. 30 tablet 5  . traMADol (ULTRAM) 50 MG tablet Take 1 tablet (50 mg total) by mouth 2 (two) times daily. 60 tablet 1   No facility-administered medications prior to visit.    ROS Review of Systems  Constitutional: Negative for chills, activity change, appetite change, fatigue and unexpected weight change.  HENT: Negative for congestion, mouth sores and sinus pressure.   Eyes: Negative for visual disturbance.  Respiratory: Negative for cough  and chest tightness.   Gastrointestinal: Negative for nausea and abdominal pain.  Genitourinary: Negative for frequency, difficulty urinating and vaginal pain.  Musculoskeletal: Negative for back pain and gait problem.  Skin: Positive for rash. Negative for pallor.  Neurological: Negative for dizziness, tremors, weakness, numbness and headaches.  Psychiatric/Behavioral: Negative for suicidal ideas, confusion and sleep disturbance. The patient is nervous/anxious.     Objective:  BP 138/88 mmHg  Pulse 111  Wt 246 lb (111.585 kg)  SpO2 94%  BP Readings from Last 3 Encounters:  06/09/15 138/88  05/06/15 132/82  04/14/15 132/90    Wt Readings from Last 3 Encounters:  06/09/15 246 lb (111.585 kg)  05/06/15 245 lb (111.131 kg)  04/14/15 243 lb (110.224 kg)    Physical Exam  Constitutional: She appears well-developed. No distress.  HENT:  Head: Normocephalic.  Right Ear: External ear normal.  Left Ear: External ear normal.  Nose: Nose normal.  Mouth/Throat: Oropharynx is clear and moist.  Eyes: Conjunctivae are normal. Pupils are equal, round, and reactive to light. Right eye exhibits no discharge. Left eye exhibits no discharge.  Neck: Normal range of motion. Neck supple. No JVD present. No tracheal deviation present. No thyromegaly present.  Cardiovascular: Normal rate, regular rhythm and normal heart sounds.   Pulmonary/Chest: No stridor. No respiratory distress. She has no wheezes.  Abdominal: Soft. Bowel sounds are normal. She  exhibits no distension and no mass. There is no tenderness. There is no rebound and no guarding.  Musculoskeletal: She exhibits no edema or tenderness.  Lymphadenopathy:    She has no cervical adenopathy.  Neurological: She displays normal reflexes. No cranial nerve deficit. She exhibits normal muscle tone. Coordination normal.  Skin: Rash noted. There is erythema.  Psychiatric: She has a normal mood and affect. Her behavior is normal. Judgment and  thought content normal.  rash on chest, feet - painful, high arches  Lab Results  Component Value Date   WBC 8.4 04/14/2015   HGB 15.6* 04/14/2015   HCT 46.7* 04/14/2015   PLT 397.0 04/14/2015   GLUCOSE 88 04/14/2015   CHOL 184 12/09/2014   TRIG 112.0 12/09/2014   HDL 61.40 12/09/2014   LDLCALC 100* 12/09/2014   ALT 18 12/09/2014   AST 17 12/09/2014   NA 140 04/14/2015   K 4.2 04/14/2015   CL 104 04/14/2015   CREATININE 0.60 04/14/2015   BUN 15 04/14/2015   CO2 30 04/14/2015   TSH 0.84 12/09/2014   INR 1.02 11/28/2014    Mm Digital Screening Bilateral  04/21/2015  CLINICAL DATA:  Screening. EXAM: DIGITAL SCREENING BILATERAL MAMMOGRAM WITH CAD COMPARISON:  None. ACR Breast Density Category a: The breast tissue is almost entirely fatty. FINDINGS: There are no findings suspicious for malignancy. Images were processed with CAD. IMPRESSION: No mammographic evidence of malignancy. A result letter of this screening mammogram will be mailed directly to the patient. RECOMMENDATION: Screening mammogram in one year. (Code:SM-B-01Y) BI-RADS CATEGORY  1: Negative. Electronically Signed   By: Britta Mccreedy M.D.   On: 04/21/2015 16:04    Assessment & Plan:   Diagnoses and all orders for this visit:  OSA (obstructive sleep apnea)  Rash and nonspecific skin eruption  Discoid lupus  Other orders -     ALPRAZolam (XANAX) 1 MG tablet; Take 1 tablet (1 mg total) by mouth at bedtime as needed for anxiety. -     Discontinue: amphetamine-dextroamphetamine (ADDERALL) 10 MG tablet; Take 1 tablet (10 mg total) by mouth 2 (two) times daily. Please fill on or after 07/11/15 -     rOPINIRole (REQUIP) 0.5 MG tablet; Take 1 tablet (0.5 mg total) by mouth at bedtime. -     traMADol (ULTRAM) 50 MG tablet; Take 1 tablet (50 mg total) by mouth 2 (two) times daily. -     Discontinue: amphetamine-dextroamphetamine (ADDERALL) 10 MG tablet; Take 1 tablet (10 mg total) by mouth 2 (two) times daily. Please fill on  or after 08/11/15 -     amphetamine-dextroamphetamine (ADDERALL) 10 MG tablet; Take 1 tablet (10 mg total) by mouth 2 (two) times daily. Please fill on or after 09/08/15  I have discontinued Tiffany Medina's amphetamine-dextroamphetamine and amphetamine-dextroamphetamine. I have also changed her amphetamine-dextroamphetamine. Additionally, I am having her maintain her ranitidine, aspirin, loratadine, diphenhydrAMINE, triamcinolone cream, levothyroxine, Cyanocobalamin, gabapentin, ALPRAZolam, rOPINIRole, and traMADol.  Meds ordered this encounter  Medications  . ALPRAZolam (XANAX) 1 MG tablet    Sig: Take 1 tablet (1 mg total) by mouth at bedtime as needed for anxiety.    Dispense:  90 tablet    Refill:  1  . DISCONTD: amphetamine-dextroamphetamine (ADDERALL) 10 MG tablet    Sig: Take 1 tablet (10 mg total) by mouth 2 (two) times daily. Please fill on or after 07/11/15    Dispense:  60 tablet    Refill:  0  . rOPINIRole (REQUIP) 0.5 MG tablet  Sig: Take 1 tablet (0.5 mg total) by mouth at bedtime.    Dispense:  30 tablet    Refill:  5  . traMADol (ULTRAM) 50 MG tablet    Sig: Take 1 tablet (50 mg total) by mouth 2 (two) times daily.    Dispense:  60 tablet    Refill:  1  . DISCONTD: amphetamine-dextroamphetamine (ADDERALL) 10 MG tablet    Sig: Take 1 tablet (10 mg total) by mouth 2 (two) times daily. Please fill on or after 08/11/15    Dispense:  60 tablet    Refill:  0  . amphetamine-dextroamphetamine (ADDERALL) 10 MG tablet    Sig: Take 1 tablet (10 mg total) by mouth 2 (two) times daily. Please fill on or after 09/08/15    Dispense:  60 tablet    Refill:  0     Follow-up: No Follow-up on file.  Sonda PrimesAlex Plotnikov, MD

## 2015-06-16 ENCOUNTER — Ambulatory Visit: Payer: Self-pay | Admitting: Internal Medicine

## 2015-06-20 ENCOUNTER — Ambulatory Visit: Payer: Self-pay | Admitting: Internal Medicine

## 2015-06-20 ENCOUNTER — Ambulatory Visit: Payer: Self-pay | Admitting: Genetic Counselor

## 2015-06-20 ENCOUNTER — Telehealth: Payer: Self-pay | Admitting: Genetic Counselor

## 2015-06-20 DIAGNOSIS — Z1379 Encounter for other screening for genetic and chromosomal anomalies: Secondary | ICD-10-CM

## 2015-06-20 DIAGNOSIS — Z800 Family history of malignant neoplasm of digestive organs: Secondary | ICD-10-CM

## 2015-06-20 DIAGNOSIS — Z803 Family history of malignant neoplasm of breast: Secondary | ICD-10-CM

## 2015-06-20 HISTORY — DX: Encounter for other screening for genetic and chromosomal anomalies: Z13.79

## 2015-06-20 NOTE — Telephone Encounter (Signed)
Revealed negative genetic test results on the Breast/Ovarian cancer panel. 

## 2015-06-20 NOTE — Progress Notes (Signed)
HPI: Ms. Aplin was previously seen in the [place] clinic due to a family history of cancer and concerns regarding a hereditary predisposition to cancer. Please refer to our prior cancer genetics clinic note for more information regarding Ms. Yaffe's medical, social and family histories, and our assessment and recommendations, at the time. Ms. Neiswonger recent genetic test results were disclosed to her, as were recommendations warranted by these results. These results and recommendations are discussed in more detail below.  FAMILY HISTORY:  We obtained a detailed, 4-generation family history.  Significant diagnoses are listed below: Family History  Problem Relation Age of Onset  . Breast cancer Mother 53    bilateral breast cancer  . Hodgkin's lymphoma Father     dx in his 67s  . Lung cancer Father 78  . Cancer Other   . Diabetes Other   . Mental illness Other   . Lung cancer Sister 61    adenocarcinoma (also a smoker)  . Lupus Maternal Uncle   . Colon cancer Maternal Uncle     dx in his 76s  . Cancer Paternal Uncle     2 paternal uncles with cancer NOS  . Diabetes Maternal Grandmother   . Colon cancer Maternal Grandfather     late 20s  . Diabetes Paternal Grandmother   . Colon cancer Paternal Grandfather 70  . Breast cancer Sister 35    The patient has one daughter, two brothers and two sisters. One sister was daignosed with breast cancer at 30, and one sister was diagnosed with lung cancer at 24. Her mother was diagnosed with bilateral breast cancer at 61 and died at 56. Her mother had several brothers and sisters. One brother had colon cancer in his 64s. Her mother's father had colon cancer in his late 109s and her mother had diabetes. The patient's father had Hodgkin's lymphoma in his early 44s and lung cancer at 57. He had 8 siblings, two had cancer NOS. His father had colon cancer in his 85s. Patient's maternal ancestors are of [place] and [place] descent, and paternal ancestors are of [place] descent. There is no reported Ashkenazi Jewish ancestry. There is no known consanguinity.  GENETIC TEST RESULTS: At the time of Ms. Tumminello's visit, we recommended she pursue genetic testing of the Breast/Ovarian cancer gene panel. The Breast/Ovarian gene panel offered by [facility] includes sequencing and rearrangement analysis for the following 20 genes:  ATM, BARD1, BRCA1, BRCA2, BRIP1, CDH1, CHEK2, EPCAM, FANCC, MLH1, MSH2, MSH6, NBN, PALB2, PMS2, PTEN, RAD51C, RAD51D, TP53, and XRCC2.   The report date is June 19, 2015.  Genetic testing was normal, and did not reveal a deleterious mutation in these genes. The test report has been scanned into EPIC and is located under the Molecular Pathology section of the Results Review tab.   We discussed with Ms. Cosner that since the current genetic testing is not perfect, it is possible there may be a gene mutation in one of these genes that current testing cannot detect, but that chance is small. We also discussed, that it is possible that another gene that has not yet been discovered, or that we have not yet tested, is responsible for the cancer diagnoses in the family, and it is, therefore, important to remain in touch with cancer genetics in the future so that we can continue to offer Ms. Roma the most up to date genetic testing.   CANCER SCREENING RECOMMENDATIONS: This normal result is reassuring  and indicates that Ms. Mcgurn does not likely have an increased risk of cancer due to a mutation in one of these genes.  We, therefore, recommended  Ms. Candelaria continue to follow the cancer screening guidelines provided by her primary healthcare providers.   RECOMMENDATIONS FOR FAMILY MEMBERS: Women in this family might be at some increased risk of developing cancer, over the general population risk, simply due to the family history of cancer. We recommended women in this family have a yearly  mammogram beginning at age 71, or 70 years younger than the earliest onset of cancer, an an annual clinical breast exam, and perform monthly breast self-exams. Women in this family should also have a gynecological exam as recommended by their primary provider. All family members should have a colonoscopy by age 25.  FOLLOW-UP: Lastly, we discussed with Ms. Wiesen that cancer genetics is a rapidly advancing field and it is possible that new genetic tests will be appropriate for her and/or her family members in the future. We encouraged her to remain in contact with cancer genetics on an annual basis so we can update her personal and family histories and let her know of advances in cancer genetics that may benefit this family.   Our contact number was provided. Ms. Savell questions were answered to her satisfaction, and she knows she is welcome to call us at anytime with additional questions or concerns.   Roma Kayser, MS, [facility] Certified Genetic Counselor [email or web address]

## 2015-06-27 ENCOUNTER — Ambulatory Visit (INDEPENDENT_AMBULATORY_CARE_PROVIDER_SITE_OTHER): Payer: BLUE CROSS/BLUE SHIELD | Admitting: Internal Medicine

## 2015-06-27 ENCOUNTER — Encounter: Payer: Self-pay | Admitting: Internal Medicine

## 2015-06-27 VITALS — BP 140/82 | HR 93 | Temp 97.8°F | Wt 245.0 lb

## 2015-06-27 DIAGNOSIS — J209 Acute bronchitis, unspecified: Secondary | ICD-10-CM | POA: Insufficient documentation

## 2015-06-27 DIAGNOSIS — R062 Wheezing: Secondary | ICD-10-CM | POA: Diagnosis not present

## 2015-06-27 MED ORDER — FLUCONAZOLE 150 MG PO TABS: 150.0000 mg | ORAL_TABLET | Freq: Once | ORAL | Status: DC

## 2015-06-27 MED ORDER — ALBUTEROL SULFATE 108 (90 BASE) MCG/ACT IN AEPB: 1.0000 | INHALATION_SPRAY | Freq: Four times a day (QID) | RESPIRATORY_TRACT | Status: DC | PRN

## 2015-06-27 MED ORDER — AZITHROMYCIN 250 MG PO TABS: ORAL_TABLET | ORAL | Status: DC

## 2015-06-27 MED ORDER — HYDROCOD POLST-CPM POLST ER 10-8 MG/5ML PO SUER
5.0000 mL | Freq: Two times a day (BID) | ORAL | Status: DC | PRN
Start: 2015-06-27 — End: 2015-09-01

## 2015-06-27 MED ORDER — METHYLPREDNISOLONE ACETATE 80 MG/ML IJ SUSP
80.0000 mg | Freq: Once | INTRAMUSCULAR | Status: AC
  Administered 2015-06-27: 80 mg via INTRAMUSCULAR

## 2015-06-27 NOTE — Addendum Note (Signed)
Addended by: Merrilyn PumaSIMMONS, Justene Jensen N on: 06/27/2015 03:33 PM   Modules accepted: Orders

## 2015-06-27 NOTE — Progress Notes (Signed)
Pre visit review using our clinic review tool, if applicable. No additional management support is needed unless otherwise documented below in the visit note. 

## 2015-06-27 NOTE — Assessment & Plan Note (Signed)
Tussionex syr Zpac

## 2015-06-27 NOTE — Progress Notes (Signed)
Subjective:  Patient ID: Tiffany Medina, female    DOB: 03-12-1966  Age: 49 y.o. MRN: [id number]  CC: No chief complaint on file.   HPI Tiffany Medina presents for URI sx's since Mon. C/o wheezing, SOB  Outpatient Prescriptions Prior to Visit  Medication Sig Dispense Refill  . ALPRAZolam (XANAX) 1 MG tablet Take 1 tablet (1 mg total) by mouth at bedtime as needed for anxiety. 90 tablet 1  . amphetamine-dextroamphetamine (ADDERALL) 10 MG tablet Take 1 tablet (10 mg total) by mouth 2 (two) times daily. Please fill on or after 09/08/15 60 tablet 0  . aspirin 325 MG tablet Take 325 mg by mouth 2 (two) times daily.     . Cyanocobalamin 1000 MCG SUBL Place 1 tablet (1,000 mcg total) under the tongue daily. 100 tablet 3  . diphenhydrAMINE (BENADRYL) 25 MG tablet Take 1 tablet (25 mg total) by mouth every 6 (six) hours as needed. 100 tablet 0  . gabapentin (NEURONTIN) 300 MG capsule Take 1 capsule (300 mg total) by mouth at bedtime. Nerve pain 90 capsule 5  . levothyroxine (SYNTHROID, LEVOTHROID) 100 MCG tablet Take 1 tablet (100 mcg total) by mouth daily before breakfast. 100 tablet 3  . loratadine (CLARITIN) 10 MG tablet Take 1 tablet (10 mg total) by mouth daily. 100 tablet 3  . ranitidine (ZANTAC) 150 MG tablet Take 150 mg by mouth daily.    Marland Kitchen. rOPINIRole (REQUIP) 0.5 MG tablet Take 1 tablet (0.5 mg total) by mouth at bedtime. 30 tablet 5  . traMADol (ULTRAM) 50 MG tablet Take 1 tablet (50 mg total) by mouth 2 (two) times daily. 60 tablet 1  . triamcinolone cream (KENALOG) 0.1 % Apply 1 application topically 2 (two) times daily. 160 g 3   No facility-administered medications prior to visit.    ROS Review of Systems  Constitutional: Positive for chills and fatigue. Negative for activity change, appetite change and unexpected weight change.  HENT: Positive for congestion and sore throat. Negative for mouth sores and sinus pressure.   Eyes: Negative for visual disturbance.  Respiratory:  Positive for cough, shortness of breath and wheezing. Negative for chest tightness.   Gastrointestinal: Negative for nausea and abdominal pain.  Genitourinary: Negative for frequency, difficulty urinating and vaginal pain.  Musculoskeletal: Negative for back pain and gait problem.  Skin: Negative for pallor and rash.  Neurological: Negative for dizziness, tremors, weakness, numbness and headaches.  Psychiatric/Behavioral: Negative for confusion and sleep disturbance.    Objective:  BP 140/82 mmHg  Pulse 93  Temp(Src) 97.8 F (36.6 C) (Oral)  Wt 245 lb (111.131 kg)  SpO2 97%  BP Readings from Last 3 Encounters:  06/27/15 140/82  06/09/15 138/88  05/06/15 132/82    Wt Readings from Last 3 Encounters:  06/27/15 245 lb (111.131 kg)  06/09/15 246 lb (111.585 kg)  05/06/15 245 lb (111.131 kg)    Physical Exam  Constitutional: She appears well-developed. No distress.  HENT:  Head: Normocephalic.  Right Ear: External ear normal.  Left Ear: External ear normal.  Nose: Nose normal.  Mouth/Throat: No oropharyngeal exudate.  Eyes: Conjunctivae are normal. Pupils are equal, round, and reactive to light. Right eye exhibits no discharge. Left eye exhibits no discharge.  Neck: Normal range of motion. Neck supple. No JVD present. No tracheal deviation present. No thyromegaly present.  Cardiovascular: Normal rate, regular rhythm and normal heart sounds.   Pulmonary/Chest: No stridor. No respiratory distress. She has no wheezes.  Abdominal: Soft. Bowel sounds  are normal. She exhibits no distension and no mass. There is no tenderness. There is no rebound and no guarding.  Musculoskeletal: She exhibits no edema or tenderness.  Lymphadenopathy:    She has no cervical adenopathy.  Neurological: She displays normal reflexes. No cranial nerve deficit. She exhibits normal muscle tone. Coordination normal.  Skin: No rash noted. No erythema.  Psychiatric: She has a normal mood and affect. Tiffany Medina  behavior is normal. Judgment and thought content normal.   eryth throat  I personally provided Proair respiclick inhaler use teaching. After the teaching patient was able to demonstrate it's use effectively. All questions were answered   Lab Results  Component Value Date   WBC 8.4 04/14/2015   HGB 15.6* 04/14/2015   HCT 46.7* 04/14/2015   PLT 397.0 04/14/2015   GLUCOSE 88 04/14/2015   CHOL 184 12/09/2014   TRIG 112.0 12/09/2014   HDL 61.40 12/09/2014   LDLCALC 100* 12/09/2014   ALT 18 12/09/2014   AST 17 12/09/2014   NA 140 04/14/2015   K 4.2 04/14/2015   CL 104 04/14/2015   CREATININE 0.60 04/14/2015   BUN 15 04/14/2015   CO2 30 04/14/2015   TSH 0.84 12/09/2014   INR 1.02 11/28/2014    Mm Digital Screening Bilateral  04/21/2015  CLINICAL DATA:  Screening. EXAM: DIGITAL SCREENING BILATERAL MAMMOGRAM WITH CAD COMPARISON:  None. ACR Breast Density Category a: The breast tissue is almost entirely fatty. FINDINGS: There are no findings suspicious for malignancy. Images were processed with CAD. IMPRESSION: No mammographic evidence of malignancy. A result letter of this screening mammogram will be mailed directly to the patient. RECOMMENDATION: Screening mammogram in one year. (Code:SM-B-01Y) BI-RADS CATEGORY  1: Negative. Electronically Signed   By: Britta Mccreedy M.D.   On: 04/21/2015 16:04    Assessment & Plan:   Diagnoses and all orders for this visit:  Acute bronchitis, unspecified organism  Wheezing  Other orders -     azithromycin (ZITHROMAX) 250 MG tablet; As directed -     chlorpheniramine-HYDROcodone (TUSSIONEX) 10-8 MG/5ML SUER; Take 5 mLs by mouth every 12 (twelve) hours as needed for cough. -     Albuterol Sulfate (PROAIR RESPICLICK) 108 (90 BASE) MCG/ACT AEPB; Inhale 1-2 puffs into the lungs 4 (four) times daily as needed. -     fluconazole (DIFLUCAN) 150 MG tablet; Take 1 tablet (150 mg total) by mouth once.  I am having Tiffany Medina start on azithromycin,  chlorpheniramine-HYDROcodone, Albuterol Sulfate, and fluconazole. I am also having Tiffany Medina maintain Tiffany Medina ranitidine, aspirin, loratadine, diphenhydrAMINE, triamcinolone cream, levothyroxine, Cyanocobalamin, gabapentin, ALPRAZolam, rOPINIRole, traMADol, and amphetamine-dextroamphetamine.  Meds ordered this encounter  Medications  . azithromycin (ZITHROMAX) 250 MG tablet    Sig: As directed    Dispense:  6 tablet    Refill:  0  . chlorpheniramine-HYDROcodone (TUSSIONEX) 10-8 MG/5ML SUER    Sig: Take 5 mLs by mouth every 12 (twelve) hours as needed for cough.    Dispense:  115 mL    Refill:  0  . Albuterol Sulfate (PROAIR RESPICLICK) 108 (90 BASE) MCG/ACT AEPB    Sig: Inhale 1-2 puffs into the lungs 4 (four) times daily as needed.    Dispense:  1 each    Refill:  11  . fluconazole (DIFLUCAN) 150 MG tablet    Sig: Take 1 tablet (150 mg total) by mouth once.    Dispense:  3 tablet    Refill:  1     Follow-up: No Follow-up on file.  Walker Kehr, MD

## 2015-06-27 NOTE — Assessment & Plan Note (Addendum)
Proair Depo-medrol 80 mg IM

## 2015-09-01 ENCOUNTER — Ambulatory Visit (INDEPENDENT_AMBULATORY_CARE_PROVIDER_SITE_OTHER): Payer: BLUE CROSS/BLUE SHIELD | Admitting: Internal Medicine

## 2015-09-01 ENCOUNTER — Encounter: Payer: Self-pay | Admitting: Internal Medicine

## 2015-09-01 VITALS — BP 120/80 | HR 116 | Wt 240.0 lb

## 2015-09-01 DIAGNOSIS — F909 Attention-deficit hyperactivity disorder, unspecified type: Secondary | ICD-10-CM

## 2015-09-01 DIAGNOSIS — K219 Gastro-esophageal reflux disease without esophagitis: Secondary | ICD-10-CM

## 2015-09-01 DIAGNOSIS — E559 Vitamin D deficiency, unspecified: Secondary | ICD-10-CM | POA: Diagnosis not present

## 2015-09-01 DIAGNOSIS — E038 Other specified hypothyroidism: Secondary | ICD-10-CM

## 2015-09-01 DIAGNOSIS — F988 Other specified behavioral and emotional disorders with onset usually occurring in childhood and adolescence: Secondary | ICD-10-CM

## 2015-09-01 DIAGNOSIS — R000 Tachycardia, unspecified: Secondary | ICD-10-CM

## 2015-09-01 DIAGNOSIS — E034 Atrophy of thyroid (acquired): Secondary | ICD-10-CM

## 2015-09-01 MED ORDER — ALPRAZOLAM 1 MG PO TABS: 1.0000 mg | ORAL_TABLET | Freq: Every evening | ORAL | Status: DC | PRN

## 2015-09-01 MED ORDER — TRAMADOL HCL 50 MG PO TABS: 50.0000 mg | ORAL_TABLET | Freq: Two times a day (BID) | ORAL | Status: DC

## 2015-09-01 MED ORDER — AMPHETAMINE-DEXTROAMPHETAMINE 10 MG PO TABS: 10.0000 mg | ORAL_TABLET | Freq: Two times a day (BID) | ORAL | Status: DC

## 2015-09-01 MED ORDER — PANTOPRAZOLE SODIUM 40 MG PO TBEC: 40.0000 mg | DELAYED_RELEASE_TABLET | Freq: Every day | ORAL | Status: DC

## 2015-09-01 NOTE — Assessment & Plan Note (Signed)
Chronic On Levothroid - 

## 2015-09-01 NOTE — Progress Notes (Signed)
Subjective:  Patient ID: Tiffany Medina, female    DOB: [birth year]  Age: 50 y.o. MRN: [id number]  CC: No chief complaint on file.   HPI Tiffany Medina presents for worsening GERD. F/u ADD, lupus, vit D def  Outpatient Prescriptions Prior to Visit  Medication Sig Dispense Refill  . Albuterol Sulfate (PROAIR RESPICLICK) 108 (90 BASE) MCG/ACT AEPB Inhale 1-2 puffs into the lungs 4 (four) times daily as needed. 1 each 11  . aspirin 325 MG tablet Take 325 mg by mouth 2 (two) times daily.     . Cyanocobalamin 1000 MCG SUBL Place 1 tablet (1,000 mcg total) under the tongue daily. 100 tablet 3  . diphenhydrAMINE (BENADRYL) 25 MG tablet Take 1 tablet (25 mg total) by mouth every 6 (six) hours as needed. 100 tablet 0  . gabapentin (NEURONTIN) 300 MG capsule Take 1 capsule (300 mg total) by mouth at bedtime. Nerve pain 90 capsule 5  . levothyroxine (SYNTHROID, LEVOTHROID) 100 MCG tablet Take 1 tablet (100 mcg total) by mouth daily before breakfast. 100 tablet 3  . loratadine (CLARITIN) 10 MG tablet Take 1 tablet (10 mg total) by mouth daily. 100 tablet 3  . ranitidine (ZANTAC) 150 MG tablet Take 150 mg by mouth daily.    Marland Kitchen rOPINIRole (REQUIP) 0.5 MG tablet Take 1 tablet (0.5 mg total) by mouth at bedtime. 30 tablet 5  . triamcinolone cream (KENALOG) 0.1 % Apply 1 application topically 2 (two) times daily. 160 g 3  . ALPRAZolam (XANAX) 1 MG tablet Take 1 tablet (1 mg total) by mouth at bedtime as needed for anxiety. 90 tablet 1  . amphetamine-dextroamphetamine (ADDERALL) 10 MG tablet Take 1 tablet (10 mg total) by mouth 2 (two) times daily. Please fill on or after 09/08/15 60 tablet 0  . azithromycin (ZITHROMAX) 250 MG tablet As directed 6 tablet 0  . chlorpheniramine-HYDROcodone (TUSSIONEX) 10-8 MG/5ML SUER Take 5 mLs by mouth every 12 (twelve) hours as needed for cough. 115 mL 0  . fluconazole (DIFLUCAN) 150 MG tablet Take 1 tablet (150 mg total) by mouth once. 3 tablet 1  . traMADol (ULTRAM) 50 MG  tablet Take 1 tablet (50 mg total) by mouth 2 (two) times daily. 60 tablet 1   No facility-administered medications prior to visit.    ROS Review of Systems  Constitutional: Positive for fatigue. Negative for chills, activity change, appetite change and unexpected weight change.  HENT: Negative for congestion, mouth sores and sinus pressure.   Eyes: Negative for visual disturbance.  Respiratory: Negative for cough, chest tightness and wheezing.   Gastrointestinal: Negative for nausea and abdominal pain.  Genitourinary: Negative for urgency, frequency, difficulty urinating and vaginal pain.  Musculoskeletal: Positive for back pain and arthralgias. Negative for gait problem.  Skin: Negative for pallor, rash and wound.  Neurological: Negative for dizziness, tremors, weakness, numbness and headaches.  Psychiatric/Behavioral: Positive for decreased concentration. Negative for confusion, sleep disturbance and dysphoric mood. The patient is nervous/anxious.     Objective:  BP 120/80 mmHg  Pulse 116  Wt 240 lb (108.863 kg)  SpO2 94%  BP Readings from Last 3 Encounters:  09/01/15 120/80  06/27/15 140/82  06/09/15 138/88    Wt Readings from Last 3 Encounters:  09/01/15 240 lb (108.863 kg)  06/27/15 245 lb (111.131 kg)  06/09/15 246 lb (111.585 kg)    Physical Exam  Constitutional: She appears well-developed. No distress.  HENT:  Head: Normocephalic.  Right Ear: External ear normal.  Left Ear: External  ear normal.  Nose: Nose normal.  Mouth/Throat: Oropharynx is clear and moist.  Eyes: Conjunctivae are normal. Pupils are equal, round, and reactive to light. Right eye exhibits no discharge. Left eye exhibits no discharge.  Neck: Normal range of motion. Neck supple. No JVD present. No tracheal deviation present. No thyromegaly present.  Cardiovascular: Normal rate, regular rhythm and normal heart sounds.   Pulmonary/Chest: No stridor. No respiratory distress. She has no wheezes.    Abdominal: Soft. Bowel sounds are normal. She exhibits no distension and no mass. There is no tenderness. There is no rebound and no guarding.  Musculoskeletal: She exhibits no edema or tenderness.  Lymphadenopathy:    She has no cervical adenopathy.  Neurological: She displays normal reflexes. No cranial nerve deficit. She exhibits normal muscle tone. Coordination normal.  Skin: No rash noted. No erythema.  Psychiatric: Her behavior is normal. Judgment and thought content normal.    Lab Results  Component Value Date   WBC 8.4 04/14/2015   HGB 15.6* 04/14/2015   HCT 46.7* 04/14/2015   PLT 397.0 04/14/2015   GLUCOSE 88 04/14/2015   CHOL 184 12/09/2014   TRIG 112.0 12/09/2014   HDL 61.40 12/09/2014   LDLCALC 100* 12/09/2014   ALT 18 12/09/2014   AST 17 12/09/2014   NA 140 04/14/2015   K 4.2 04/14/2015   CL 104 04/14/2015   CREATININE 0.60 04/14/2015   BUN 15 04/14/2015   CO2 30 04/14/2015   TSH 0.84 12/09/2014   INR 1.02 11/28/2014    Mm Digital Screening Bilateral  04/21/2015  CLINICAL DATA:  Screening. EXAM: DIGITAL SCREENING BILATERAL MAMMOGRAM WITH CAD COMPARISON:  None. ACR Breast Density Category a: The breast tissue is almost entirely fatty. FINDINGS: There are no findings suspicious for malignancy. Images were processed with CAD. IMPRESSION: No mammographic evidence of malignancy. A result letter of this screening mammogram will be mailed directly to the patient. RECOMMENDATION: Screening mammogram in one year. (Code:SM-B-01Y) BI-RADS CATEGORY  1: Negative. Electronically Signed   By: Britta Mccreedy M.D.   On: 04/21/2015 16:04    Assessment & Plan:   Diagnoses and all orders for this visit:  Gastroesophageal reflux disease, esophagitis presence not specified  Tachycardia  Vitamin D deficiency  ADD (attention deficit disorder)  Hypothyroidism due to acquired atrophy of thyroid  Other orders -     traMADol (ULTRAM) 50 MG tablet; Take 1 tablet (50 mg total) by  mouth 2 (two) times daily. -     Cancel: amphetamine-dextroamphetamine (ADDERALL) 10 MG tablet; Take 1 tablet (10 mg total) by mouth 2 (two) times daily. Please fill on or after 09/08/15 -     ALPRAZolam (XANAX) 1 MG tablet; Take 1 tablet (1 mg total) by mouth at bedtime as needed for anxiety. -     Discontinue: amphetamine-dextroamphetamine (ADDERALL) 10 MG tablet; Take 1 tablet (10 mg total) by mouth 2 (two) times daily. Please fill on or after 10/09/15 -     Discontinue: amphetamine-dextroamphetamine (ADDERALL) 10 MG tablet; Take 1 tablet (10 mg total) by mouth 2 (two) times daily. Please fill on or after 11/08/15 -     amphetamine-dextroamphetamine (ADDERALL) 10 MG tablet; Take 1 tablet (10 mg total) by mouth 2 (two) times daily. Please fill on or after 12/09/15 -     pantoprazole (PROTONIX) 40 MG tablet; Take 1 tablet (40 mg total) by mouth daily.  I have discontinued Ms. Boberg's amphetamine-dextroamphetamine, azithromycin, chlorpheniramine-HYDROcodone, fluconazole, and amphetamine-dextroamphetamine. I have also changed her amphetamine-dextroamphetamine. Additionally,  I am having her start on pantoprazole. Lastly, I am having her maintain her ranitidine, aspirin, loratadine, diphenhydrAMINE, triamcinolone cream, levothyroxine, Cyanocobalamin, gabapentin, rOPINIRole, Albuterol Sulfate, traMADol, and ALPRAZolam.  Meds ordered this encounter  Medications  . traMADol (ULTRAM) 50 MG tablet    Sig: Take 1 tablet (50 mg total) by mouth 2 (two) times daily.    Dispense:  60 tablet    Refill:  1  . ALPRAZolam (XANAX) 1 MG tablet    Sig: Take 1 tablet (1 mg total) by mouth at bedtime as needed for anxiety.    Dispense:  90 tablet    Refill:  1  . DISCONTD: amphetamine-dextroamphetamine (ADDERALL) 10 MG tablet    Sig: Take 1 tablet (10 mg total) by mouth 2 (two) times daily. Please fill on or after 10/09/15    Dispense:  60 tablet    Refill:  0  . DISCONTD: amphetamine-dextroamphetamine (ADDERALL) 10 MG  tablet    Sig: Take 1 tablet (10 mg total) by mouth 2 (two) times daily. Please fill on or after 11/08/15    Dispense:  60 tablet    Refill:  0  . amphetamine-dextroamphetamine (ADDERALL) 10 MG tablet    Sig: Take 1 tablet (10 mg total) by mouth 2 (two) times daily. Please fill on or after 12/09/15    Dispense:  60 tablet    Refill:  0  . pantoprazole (PROTONIX) 40 MG tablet    Sig: Take 1 tablet (40 mg total) by mouth daily.    Dispense:  30 tablet    Refill:  11     Follow-up: Return in about 3 months (around 11/29/2015) for a follow-up visit.  Sonda Primes, MD

## 2015-09-01 NOTE — Assessment & Plan Note (Signed)
Chronic  On Adderall  Potential benefits of a long term amphetamines  use as well as potential risks  and complications were explained to the patient and were aknowledged. Monitor HR 

## 2015-09-01 NOTE — Progress Notes (Signed)
Pre visit review using our clinic review tool, if applicable. No additional management support is needed unless otherwise documented below in the visit note. 

## 2015-09-01 NOTE — Assessment & Plan Note (Signed)
S tachy - chronic

## 2015-09-01 NOTE — Assessment & Plan Note (Signed)
Protonix GERD if needed

## 2015-09-01 NOTE — Assessment & Plan Note (Signed)
On Vit D 

## 2015-09-08 ENCOUNTER — Ambulatory Visit: Payer: Self-pay | Admitting: Internal Medicine

## 2015-09-16 ENCOUNTER — Telehealth: Payer: Self-pay | Admitting: Internal Medicine

## 2015-09-16 NOTE — Telephone Encounter (Signed)
Patient Name: Tiffany MagnusonKIMBERLY Nealy  DOB: March 19, 1966    Initial Comment Caller States she is having shortness of breath, she cant get a full deep breath without pain.    Nurse Assessment  Nurse: Renaldo FiddlerAdkins, RN, Raynelle FanningJulie Date/Time Lamount Cohen(Eastern Time): 09/16/2015 2:06:32 PM  Confirm and document reason for call. If symptomatic, describe symptoms. You must click the next button to save text entered. ---Caller states she woke up Saturday morning with fever, body aches, dry cough , nasal congestion ,ear aches and a sore throat. She had fever Sat/Sunday but she began to have pain. with coughing. Her fever is back , she does feel short of breath and her throat is hurting. She can not get her O2 sat above 92%, has been as low as 87% on Sunday. She has asthma, has been using her ProAir q 4 hrs and she is wheezing. She is coughing up brown/yellow secretions.  Has the patient traveled out of the country within the last 30 days? ---Not Applicable  Does the patient have any new or worsening symptoms? ---Yes  Will a triage be completed? ---Yes  Related visit to physician within the last 2 weeks? ---No  Does the PT have any chronic conditions? (i.e. diabetes, asthma, etc.) ---Yes  List chronic conditions. ---GERD, Anxiety, Neuropathy, Asthma( Proair)  Is the patient pregnant or possibly pregnant? (Ask all females between the ages of 2212-55) ---No  Is this a behavioral health or substance abuse call? ---No     Guidelines    Guideline Title Affirmed Question Affirmed Notes  Asthma Attack Patient sounds very sick or weak to the triager    Final Disposition User   Go to ED Now (or PCP triage) Renaldo FiddlerAdkins, RN, Raynelle FanningJulie    Comments  ED outcome due to multiple sx w/medical hx.   [facility]   Disagree/Comply: Comply

## 2015-09-17 NOTE — Telephone Encounter (Signed)
Agree w/ER Thx 

## 2015-10-31 ENCOUNTER — Other Ambulatory Visit: Payer: Self-pay | Admitting: Internal Medicine

## 2015-11-03 NOTE — Telephone Encounter (Signed)
[facility].Marland Kitchen.Raechel Chute/lmb

## 2015-11-03 NOTE — Telephone Encounter (Signed)
OV q 3 mo 

## 2015-11-28 ENCOUNTER — Other Ambulatory Visit: Payer: Self-pay | Admitting: Internal Medicine

## 2015-11-28 NOTE — Telephone Encounter (Signed)
Pt request to come to pick this up on Tuesday. Please call her back

## 2015-12-02 ENCOUNTER — Ambulatory Visit: Payer: Self-pay | Admitting: Internal Medicine

## 2015-12-03 NOTE — Telephone Encounter (Signed)
Pt is calling again about her RX she wants to pick it up on Friday.

## 2015-12-04 NOTE — Telephone Encounter (Signed)
OV q 3 months

## 2015-12-04 NOTE — Telephone Encounter (Signed)
OV scheduled 01/02/16. Pt informed

## 2015-12-04 NOTE — Telephone Encounter (Signed)
[facility] Pt informed/ transferred to scheduler to make appt.

## 2015-12-05 ENCOUNTER — Other Ambulatory Visit: Payer: Self-pay | Admitting: Internal Medicine

## 2016-01-02 ENCOUNTER — Ambulatory Visit (INDEPENDENT_AMBULATORY_CARE_PROVIDER_SITE_OTHER): Payer: BLUE CROSS/BLUE SHIELD | Admitting: Internal Medicine

## 2016-01-02 ENCOUNTER — Encounter: Payer: Self-pay | Admitting: Internal Medicine

## 2016-01-02 VITALS — BP 140/90 | HR 104 | Temp 98.3°F | Ht 65.0 in | Wt 249.0 lb

## 2016-01-02 DIAGNOSIS — F909 Attention-deficit hyperactivity disorder, unspecified type: Secondary | ICD-10-CM | POA: Diagnosis not present

## 2016-01-02 DIAGNOSIS — F988 Other specified behavioral and emotional disorders with onset usually occurring in childhood and adolescence: Secondary | ICD-10-CM

## 2016-01-02 DIAGNOSIS — E038 Other specified hypothyroidism: Secondary | ICD-10-CM | POA: Diagnosis not present

## 2016-01-02 DIAGNOSIS — K2100 Gastro-esophageal reflux disease with esophagitis, without bleeding: Secondary | ICD-10-CM

## 2016-01-02 DIAGNOSIS — E034 Atrophy of thyroid (acquired): Secondary | ICD-10-CM

## 2016-01-02 DIAGNOSIS — G4700 Insomnia, unspecified: Secondary | ICD-10-CM | POA: Diagnosis not present

## 2016-01-02 MED ORDER — TRIAZOLAM 0.25 MG PO TABS: 0.2500 mg | ORAL_TABLET | Freq: Every evening | ORAL | Status: DC | PRN

## 2016-01-02 MED ORDER — AMPHETAMINE-DEXTROAMPHETAMINE 10 MG PO TABS: 10.0000 mg | ORAL_TABLET | Freq: Two times a day (BID) | ORAL | Status: DC

## 2016-01-02 MED ORDER — DIPHENHYDRAMINE HCL 25 MG PO TABS: 25.0000 mg | ORAL_TABLET | Freq: Four times a day (QID) | ORAL | Status: DC | PRN

## 2016-01-02 MED ORDER — GABAPENTIN 300 MG PO CAPS: 300.0000 mg | ORAL_CAPSULE | Freq: Every day | ORAL | Status: DC

## 2016-01-02 MED ORDER — ROPINIROLE HCL 0.5 MG PO TABS: 0.5000 mg | ORAL_TABLET | Freq: Every day | ORAL | Status: DC

## 2016-01-02 MED ORDER — ASPIRIN 325 MG PO TABS: 325.0000 mg | ORAL_TABLET | Freq: Two times a day (BID) | ORAL | Status: DC

## 2016-01-02 MED ORDER — LORATADINE 10 MG PO TABS: 10.0000 mg | ORAL_TABLET | Freq: Every day | ORAL | Status: DC

## 2016-01-02 MED ORDER — LEVOTHYROXINE SODIUM 100 MCG PO TABS: 100.0000 ug | ORAL_TABLET | Freq: Every day | ORAL | Status: DC

## 2016-01-02 MED ORDER — BLACK COHOSH 160 MG PO CAPS: ORAL_CAPSULE | ORAL | Status: DC

## 2016-01-02 MED ORDER — CYANOCOBALAMIN 1000 MCG SL SUBL: 1.0000 | SUBLINGUAL_TABLET | Freq: Every day | SUBLINGUAL | Status: DC

## 2016-01-02 MED ORDER — TRAMADOL HCL 50 MG PO TABS: 50.0000 mg | ORAL_TABLET | Freq: Two times a day (BID) | ORAL | Status: DC

## 2016-01-02 NOTE — Progress Notes (Signed)
Pre visit review using our clinic review tool, if applicable. No additional management support is needed unless otherwise documented below in the visit note. 

## 2016-01-02 NOTE — Assessment & Plan Note (Signed)
On Levothroid 

## 2016-01-02 NOTE — Assessment & Plan Note (Signed)
On Adderall  Potential benefits of a long term amphetamines  use as well as potential risks  and complications were explained to the patient and were aknowledged. Monitor HR

## 2016-01-02 NOTE — Assessment & Plan Note (Signed)
Protonix.  ?

## 2016-01-02 NOTE — Assessment & Plan Note (Signed)
Change to Triazolam  Potential benefits of a long term benzodiazepines  use as well as potential risks  and complications were explained to the patient and were aknowledged.

## 2016-01-02 NOTE — Progress Notes (Signed)
Subjective:  Patient ID: Tiffany Medina, female    DOB: Dec 23, 1965  Age: 50 y.o. MRN: [id number]  CC: No chief complaint on file.   HPI   Tiffany MagnusonKimberly Medina presents for fatigue, wt gain, insomnia - worse. 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 Albuterol Sulfate (PROAIR RESPICLICK) 108 (90 BASE) MCG/ACT AEPB Inhale 1-2 puffs into the lungs 4 (four) times daily as needed. 1 each 11  . ALPRAZolam (XANAX) 1 MG tablet Take 1 tablet (1 mg total) by mouth at bedtime as needed for anxiety. 90 tablet 1  . amphetamine-dextroamphetamine (ADDERALL) 10 MG tablet TAKE 1 TABLET BY MOUTH TWICE DAILY 60 tablet 0  . aspirin 325 MG tablet Take 325 mg by mouth 2 (two) times daily.     . Cyanocobalamin 1000 MCG SUBL Place 1 tablet (1,000 mcg total) under the tongue daily. 100 tablet 3  . diphenhydrAMINE (BENADRYL) 25 MG tablet Take 1 tablet (25 mg total) by mouth every 6 (six) hours as needed. 100 tablet 0  . gabapentin (NEURONTIN) 300 MG capsule Take 1 capsule (300 mg total) by mouth at bedtime. Nerve pain 90 capsule 5  . levothyroxine (SYNTHROID, LEVOTHROID) 100 MCG tablet TAKE 1 TABLET BY MOUTH DAILY BEFORE BREAKFAST 100 tablet 0  . loratadine (CLARITIN) 10 MG tablet Take 1 tablet (10 mg total) by mouth daily. 100 tablet 3  . pantoprazole (PROTONIX) 40 MG tablet Take 1 tablet (40 mg total) by mouth daily. 30 tablet 11  . ranitidine (ZANTAC) 150 MG tablet Take 150 mg by mouth daily.    Marland Kitchen. rOPINIRole (REQUIP) 0.5 MG tablet TAKE 1 TABLET BY MOUTH AT BEDTIME 30 tablet 0  . traMADol (ULTRAM) 50 MG tablet TAKE 1 TABLET BY MOUTH TWICE DAILY 60 tablet 0  . triamcinolone cream (KENALOG) 0.1 % Apply 1 application topically 2 (two) times daily. 160 g 3   No facility-administered medications prior to visit.    ROS Review of Systems  Constitutional: Positive for fatigue and unexpected weight change. Negative for chills, activity change and  appetite change.  HENT: Negative for congestion, mouth sores and sinus pressure.   Eyes: Negative for visual disturbance.  Respiratory: Negative for cough and chest tightness.   Gastrointestinal: Negative for nausea and abdominal pain.  Genitourinary: Negative for frequency, difficulty urinating and vaginal pain.  Musculoskeletal: Negative for back pain and gait problem.  Skin: Negative for pallor and rash.  Neurological: Negative for dizziness, tremors, weakness, numbness and headaches.  Psychiatric/Behavioral: Positive for sleep disturbance and decreased concentration. Negative for suicidal ideas and confusion. The patient is nervous/anxious.     Objective:  BP 140/90 mmHg  Pulse 104  Temp(Src) 98.3 F (36.8 C) (Oral)  Ht 5\' 5"  (1.651 m)  Wt 249 lb (112.946 kg)  BMI 41.44 kg/m2  SpO2 96%  BP Readings from Last 3 Encounters:  01/02/16 140/90  09/01/15 120/80  06/27/15 140/82    Wt Readings from Last 3 Encounters:  01/02/16 249 lb (112.946 kg)  09/01/15 240 lb (108.863 kg)  06/27/15 245 lb (111.131 kg)    Physical Exam  Constitutional: She appears well-developed. No distress.  HENT:  Head: Normocephalic.  Right Ear: External ear normal.  Left Ear: External ear normal.  Nose: Nose normal.  Mouth/Throat: Oropharynx is clear and moist.  Eyes: Conjunctivae are normal. Pupils are equal, round, and reactive to light. Right eye exhibits no discharge. Left eye exhibits no discharge.  Neck: Normal range of motion. Neck supple. No JVD present. No tracheal deviation present. No thyromegaly present.  Cardiovascular: Normal rate, regular rhythm and normal heart sounds.   Pulmonary/Chest: No stridor. No respiratory distress. She has no wheezes.  Abdominal: Soft. Bowel sounds are normal. She exhibits no distension and no mass. There is no tenderness. There is no rebound and no guarding.  Musculoskeletal: She exhibits no edema or tenderness.  Lymphadenopathy:    She has no cervical  adenopathy.  Neurological: She displays normal reflexes. No cranial nerve deficit. She exhibits normal muscle tone. Coordination normal.  Skin: No rash noted. No erythema.  Psychiatric: She has a normal mood and affect. Her behavior is normal. Judgment and thought content normal.  Obese   Lab Results  Component Value Date   WBC 8.4 04/14/2015   HGB 15.6* 04/14/2015   HCT 46.7* 04/14/2015   PLT 397.0 04/14/2015   GLUCOSE 88 04/14/2015   CHOL 184 12/09/2014   TRIG 112.0 12/09/2014   HDL 61.40 12/09/2014   LDLCALC 100* 12/09/2014   ALT 18 12/09/2014   AST 17 12/09/2014   NA 140 04/14/2015   K 4.2 04/14/2015   CL 104 04/14/2015   CREATININE 0.60 04/14/2015   BUN 15 04/14/2015   CO2 30 04/14/2015   TSH 0.84 12/09/2014   INR 1.02 11/28/2014    Mm Digital Screening Bilateral  04/21/2015  CLINICAL DATA:  Screening. EXAM: DIGITAL SCREENING BILATERAL MAMMOGRAM WITH CAD COMPARISON:  None. ACR Breast Density Category a: The breast tissue is almost entirely fatty. FINDINGS: There are no findings suspicious for malignancy. Images were processed with CAD. IMPRESSION: No mammographic evidence of malignancy. A result letter of this screening mammogram will be mailed directly to the patient. RECOMMENDATION: Screening mammogram in one year. (Code:SM-B-01Y) BI-RADS CATEGORY  1: Negative. Electronically Signed   By: Britta MccreedySusan  Turner M.D.   On: 04/21/2015 16:04    Assessment & Plan:   There are no diagnoses linked to this encounter. I am having Ms. Calais maintain her ranitidine, aspirin, loratadine, diphenhydrAMINE, triamcinolone cream, Cyanocobalamin, gabapentin, Albuterol Sulfate, ALPRAZolam, pantoprazole, amphetamine-dextroamphetamine, traMADol, levothyroxine, and rOPINIRole.  No orders of the defined types were placed in this encounter.     Follow-up: No Follow-up on file.  Sonda PrimesAlex Plotnikov, MD

## 2016-01-12 DIAGNOSIS — A630 Anogenital (venereal) warts: Secondary | ICD-10-CM | POA: Diagnosis not present

## 2016-01-12 DIAGNOSIS — N760 Acute vaginitis: Secondary | ICD-10-CM | POA: Diagnosis not present

## 2016-01-12 DIAGNOSIS — L989 Disorder of the skin and subcutaneous tissue, unspecified: Secondary | ICD-10-CM | POA: Diagnosis not present

## 2016-02-03 ENCOUNTER — Other Ambulatory Visit: Payer: Self-pay | Admitting: Internal Medicine

## 2016-02-04 MED ORDER — AMPHETAMINE-DEXTROAMPHETAMINE 10 MG PO TABS: 10.0000 mg | ORAL_TABLET | Freq: Two times a day (BID) | ORAL | 0 refills | Status: DC

## 2016-02-04 NOTE — Addendum Note (Signed)
Addended by: Deatra James on: 02/04/2016 04:25 PM   Modules accepted: Orders

## 2016-02-04 NOTE — Telephone Encounter (Signed)
Reprinted rx MD printed from home & rx did not print at the office. Reprint rx for MD to sign...Raechel Chute

## 2016-02-04 NOTE — Telephone Encounter (Signed)
Called pt no answer [facility] rx ready for pick-up.../lmb 

## 2016-03-26 ENCOUNTER — Ambulatory Visit (INDEPENDENT_AMBULATORY_CARE_PROVIDER_SITE_OTHER): Payer: BLUE CROSS/BLUE SHIELD | Admitting: Internal Medicine

## 2016-03-26 ENCOUNTER — Encounter: Payer: Self-pay | Admitting: Internal Medicine

## 2016-03-26 ENCOUNTER — Other Ambulatory Visit (INDEPENDENT_AMBULATORY_CARE_PROVIDER_SITE_OTHER): Payer: BLUE CROSS/BLUE SHIELD

## 2016-03-26 DIAGNOSIS — F909 Attention-deficit hyperactivity disorder, unspecified type: Secondary | ICD-10-CM | POA: Diagnosis not present

## 2016-03-26 DIAGNOSIS — K2100 Gastro-esophageal reflux disease with esophagitis, without bleeding: Secondary | ICD-10-CM

## 2016-03-26 DIAGNOSIS — L930 Discoid lupus erythematosus: Secondary | ICD-10-CM | POA: Diagnosis not present

## 2016-03-26 DIAGNOSIS — E559 Vitamin D deficiency, unspecified: Secondary | ICD-10-CM

## 2016-03-26 DIAGNOSIS — R000 Tachycardia, unspecified: Secondary | ICD-10-CM

## 2016-03-26 DIAGNOSIS — F988 Other specified behavioral and emotional disorders with onset usually occurring in childhood and adolescence: Secondary | ICD-10-CM

## 2016-03-26 LAB — BASIC METABOLIC PANEL
BUN: 15 mg/dL (ref 6–23)
CHLORIDE: 102 meq/L (ref 96–112)
CO2: 32 mEq/L (ref 19–32)
CREATININE: 0.77 mg/dL (ref 0.40–1.20)
Calcium: 9.8 mg/dL (ref 8.4–10.5)
GFR: 83.86 mL/min (ref >60.00)
Glucose, Bld: 88 mg/dL (ref 70–99)
Potassium: 4.4 mEq/L (ref 3.5–5.1)
Sodium: 140 mEq/L (ref 135–145)

## 2016-03-26 LAB — CBC WITH DIFFERENTIAL/PLATELET
BASOS ABS: 0 10*3/uL (ref 0.0–0.1)
Basophils Relative: 0.4 % (ref 0.0–3.0)
EOS ABS: 0.1 10*3/uL (ref 0.0–0.7)
EOS PCT: 1.4 % (ref 0.0–5.0)
HCT: 43.3 % (ref 36.0–46.0)
Hemoglobin: 14.9 g/dL (ref 12.0–15.0)
LYMPHS ABS: 1.9 10*3/uL (ref 0.7–4.0)
LYMPHS PCT: 20.5 % (ref 12.0–46.0)
MCHC: 34.4 g/dL (ref 30.0–36.0)
MCV: 90.3 fl (ref 78.0–100.0)
MONOS PCT: 7.6 % (ref 3.0–12.0)
Monocytes Absolute: 0.7 10*3/uL (ref 0.1–1.0)
NEUTROS ABS: 6.4 10*3/uL (ref 1.4–7.7)
NEUTROS PCT: 70.1 % (ref 43.0–77.0)
PLATELETS: 394 10*3/uL (ref 150.0–400.0)
RBC: 4.79 Mil/uL (ref 3.87–5.11)
RDW: 13.9 % (ref 11.5–15.5)
WBC: 9.2 10*3/uL (ref 4.0–10.5)

## 2016-03-26 LAB — HEPATIC FUNCTION PANEL
ALT: 18 U/L (ref 0–35)
AST: 16 U/L (ref 0–37)
Albumin: 4 g/dL (ref 3.5–5.2)
Alkaline Phosphatase: 88 U/L (ref 39–117)
BILIRUBIN DIRECT: 0 mg/dL (ref 0.0–0.3)
BILIRUBIN TOTAL: 0.2 mg/dL (ref 0.2–1.2)
Total Protein: 7.3 g/dL (ref 6.0–8.3)

## 2016-03-26 LAB — LIPID PANEL
CHOL/HDL RATIO: 3
Cholesterol: 168 mg/dL (ref 0–200)
HDL: 49.3 mg/dL (ref >39.00)
LDL CALC: 95 mg/dL (ref 0–99)
NonHDL: 118.49
TRIGLYCERIDES: 115 mg/dL (ref 0.0–149.0)
VLDL: 23 mg/dL (ref 0.0–40.0)

## 2016-03-26 LAB — TSH: TSH: 0.33 u[IU]/mL — ABNORMAL LOW (ref 0.35–4.50)

## 2016-03-26 LAB — URINALYSIS
Bilirubin Urine: NEGATIVE
HGB URINE DIPSTICK: NEGATIVE
Ketones, ur: NEGATIVE
Leukocytes, UA: NEGATIVE
Nitrite: NEGATIVE
SPECIFIC GRAVITY, URINE: 1.02 (ref 1.000–1.030)
TOTAL PROTEIN, URINE-UPE24: NEGATIVE
URINE GLUCOSE: NEGATIVE
Urobilinogen, UA: 0.2 (ref 0.0–1.0)
pH: 6 (ref 5.0–8.0)

## 2016-03-26 MED ORDER — TRIAZOLAM 0.25 MG PO TABS: 0.2500 mg | ORAL_TABLET | Freq: Every evening | ORAL | 3 refills | Status: DC | PRN

## 2016-03-26 MED ORDER — GABAPENTIN 300 MG PO CAPS: 300.0000 mg | ORAL_CAPSULE | Freq: Every day | ORAL | 1 refills | Status: DC

## 2016-03-26 MED ORDER — TRAMADOL HCL 50 MG PO TABS: 50.0000 mg | ORAL_TABLET | Freq: Two times a day (BID) | ORAL | 3 refills | Status: DC

## 2016-03-26 MED ORDER — BLACK COHOSH 540 MG PO CAPS: ORAL_CAPSULE | ORAL | 3 refills | Status: DC

## 2016-03-26 MED ORDER — AMPHETAMINE-DEXTROAMPHETAMINE 10 MG PO TABS: 10.0000 mg | ORAL_TABLET | Freq: Two times a day (BID) | ORAL | 0 refills | Status: DC

## 2016-03-26 NOTE — Assessment & Plan Note (Signed)
No change On Plaquenil

## 2016-03-26 NOTE — Progress Notes (Signed)
Subjective:  Patient ID: Tiffany Medina, female    DOB: 1966-01-14  Age: 50 y.o. MRN: [id number]  CC: No chief complaint on file.   HPI Tiffany Medina presents for ADD, B12 def, RLS, hypothyroidism f/u  Outpatient Medications Prior to Visit  Medication Sig Dispense Refill  . Albuterol Sulfate (PROAIR RESPICLICK) 108 (90 BASE) MCG/ACT AEPB Inhale 1-2 puffs into the lungs 4 (four) times daily as needed. 1 each 11  . amphetamine-dextroamphetamine (ADDERALL) 10 MG tablet Take 1 tablet (10 mg total) by mouth 2 (two) times daily. 60 tablet 0  . aspirin 325 MG tablet Take 1 tablet (325 mg total) by mouth 2 (two) times daily. 100 tablet 5  . Black Cohosh 160 MG CAPS As directed 100 capsule 5  . Cyanocobalamin 1000 MCG SUBL Place 1 tablet (1,000 mcg total) under the tongue daily. 100 tablet 3  . diphenhydrAMINE (BENADRYL) 25 MG tablet Take 1 tablet (25 mg total) by mouth every 6 (six) hours as needed for allergies. 100 tablet 3  . gabapentin (NEURONTIN) 300 MG capsule Take 1 capsule (300 mg total) by mouth at bedtime. Nerve pain 90 capsule 5  . levothyroxine (SYNTHROID, LEVOTHROID) 100 MCG tablet Take 1 tablet (100 mcg total) by mouth daily before breakfast. 100 tablet 3  . loratadine (CLARITIN) 10 MG tablet Take 1 tablet (10 mg total) by mouth daily. 100 tablet 3  . pantoprazole (PROTONIX) 40 MG tablet Take 1 tablet (40 mg total) by mouth daily. 30 tablet 11  . ranitidine (ZANTAC) 150 MG tablet Take 150 mg by mouth daily.    Marland Kitchen rOPINIRole (REQUIP) 0.5 MG tablet Take 1 tablet (0.5 mg total) by mouth at bedtime. 30 tablet 5  . traMADol (ULTRAM) 50 MG tablet Take 1 tablet (50 mg total) by mouth 2 (two) times daily. 60 tablet 3  . triamcinolone cream (KENALOG) 0.1 % Apply 1 application topically 2 (two) times daily. 160 g 3  . triazolam (HALCION) 0.25 MG tablet Take 1 tablet (0.25 mg total) by mouth at bedtime as needed for sleep. 30 tablet 3   No facility-administered medications prior to visit.      ROS Review of Systems  Constitutional: Negative for activity change, appetite change, chills, fatigue and unexpected weight change.  HENT: Positive for rhinorrhea. Negative for congestion, mouth sores and sinus pressure.   Eyes: Negative for visual disturbance.  Respiratory: Negative for cough and chest tightness.   Gastrointestinal: Negative for abdominal pain and nausea.  Genitourinary: Negative for difficulty urinating, frequency and vaginal pain.  Musculoskeletal: Positive for arthralgias and back pain. Negative for gait problem.  Skin: Negative for pallor and rash.  Neurological: Negative for dizziness, tremors, weakness, numbness and headaches.  Psychiatric/Behavioral: Positive for decreased concentration and sleep disturbance. Negative for confusion. The patient is nervous/anxious.     Objective:  BP 120/70   Pulse (!) 112   Wt 248 lb (112.5 kg)   SpO2 94%   BMI 41.27 kg/m   BP Readings from Last 3 Encounters:  03/26/16 120/70  01/02/16 140/90  09/01/15 120/80    Wt Readings from Last 3 Encounters:  03/26/16 248 lb (112.5 kg)  01/02/16 249 lb (112.9 kg)  09/01/15 240 lb (108.9 kg)    Physical Exam  Constitutional: She appears well-developed. No distress.  HENT:  Head: Normocephalic.  Right Ear: External ear normal.  Left Ear: External ear normal.  Nose: Nose normal.  Mouth/Throat: Oropharynx is clear and moist.  Eyes: Conjunctivae are normal. Pupils are  equal, round, and reactive to light. Right eye exhibits no discharge. Left eye exhibits no discharge.  Neck: Normal range of motion. Neck supple. No JVD present. No tracheal deviation present. No thyromegaly present.  Cardiovascular: Normal rate, regular rhythm and normal heart sounds.   Pulmonary/Chest: No stridor. No respiratory distress. She has no wheezes.  Abdominal: Soft. Bowel sounds are normal. She exhibits no distension and no mass. There is no tenderness. There is no rebound and no guarding.   Musculoskeletal: She exhibits tenderness. She exhibits no edema.  Lymphadenopathy:    She has no cervical adenopathy.  Neurological: She displays normal reflexes. No cranial nerve deficit. She exhibits normal muscle tone. Coordination abnormal.  Skin: No rash noted. No erythema.  Psychiatric: She has a normal mood and affect. Her behavior is normal. Judgment and thought content normal.  Obese Tachycardic  Lab Results  Component Value Date   WBC 8.4 04/14/2015   HGB 15.6 (H) 04/14/2015   HCT 46.7 (H) 04/14/2015   PLT 397.0 04/14/2015   GLUCOSE 88 04/14/2015   CHOL 184 12/09/2014   TRIG 112.0 12/09/2014   HDL 61.40 12/09/2014   LDLCALC 100 (H) 12/09/2014   ALT 18 12/09/2014   AST 17 12/09/2014   NA 140 04/14/2015   K 4.2 04/14/2015   CL 104 04/14/2015   CREATININE 0.60 04/14/2015   BUN 15 04/14/2015   CO2 30 04/14/2015   TSH 0.84 12/09/2014   INR 1.02 11/28/2014    Mm Digital Screening Bilateral  Result Date: 04/21/2015 CLINICAL DATA:  Screening. EXAM: DIGITAL SCREENING BILATERAL MAMMOGRAM WITH CAD COMPARISON:  None. ACR Breast Density Category a: The breast tissue is almost entirely fatty. FINDINGS: There are no findings suspicious for malignancy. Images were processed with CAD. IMPRESSION: No mammographic evidence of malignancy. A result letter of this screening mammogram will be mailed directly to the patient. RECOMMENDATION: Screening mammogram in one year. (Code:SM-B-01Y) BI-RADS CATEGORY  1: Negative. Electronically Signed   By: Britta MccreedySusan  Turner M.D.   On: 04/21/2015 16:04    Assessment & Plan:   There are no diagnoses linked to this encounter. I am having Ms. Yearby maintain her ranitidine, triamcinolone cream, Albuterol Sulfate, pantoprazole, rOPINIRole, levothyroxine, gabapentin, traMADol, Cyanocobalamin, aspirin, diphenhydrAMINE, loratadine, Black Cohosh, triazolam, and amphetamine-dextroamphetamine.  No orders of the defined types were placed in this  encounter.    Follow-up: No Follow-up on file.  Sonda PrimesAlex Plotnikov, MD

## 2016-03-26 NOTE — Assessment & Plan Note (Signed)
Will try Rocaltrol if needed

## 2016-03-26 NOTE — Assessment & Plan Note (Signed)
Protonix.  ?

## 2016-03-26 NOTE — Assessment & Plan Note (Signed)
On Adderall  Potential benefits of a long term amphetamines  use as well as potential risks  and complications were explained to the patient and were aknowledged. Monitor HR  

## 2016-03-26 NOTE — Assessment & Plan Note (Signed)
Wt Readings from Last 3 Encounters:  03/26/16 248 lb (112.5 kg)  01/02/16 249 lb (112.9 kg)  09/01/15 240 lb (108.9 kg)

## 2016-03-26 NOTE — Progress Notes (Signed)
Pre visit review using our clinic review tool, if applicable. No additional management support is needed unless otherwise documented below in the visit note. 

## 2016-03-26 NOTE — Assessment & Plan Note (Signed)
S tachy - chronic

## 2016-03-27 LAB — HEPATITIS C ANTIBODY: HCV AB: NEGATIVE

## 2016-03-29 LAB — VITAMIN B12: Vitamin B-12: >1500 pg/mL — ABNORMAL HIGH (ref 211–911)

## 2016-03-29 LAB — VITAMIN D 25 HYDROXY (VIT D DEFICIENCY, FRACTURES): VITD: 25.73 ng/mL — ABNORMAL LOW (ref 30.00–100.00)

## 2016-04-09 ENCOUNTER — Ambulatory Visit: Payer: Self-pay | Admitting: Internal Medicine

## 2016-04-27 ENCOUNTER — Telehealth: Payer: Self-pay | Admitting: Internal Medicine

## 2016-04-27 MED ORDER — ALPRAZOLAM 0.5 MG PO TABS: 0.5000 mg | ORAL_TABLET | Freq: Two times a day (BID) | ORAL | 0 refills | Status: DC | PRN

## 2016-04-27 NOTE — Telephone Encounter (Signed)
I'm sorry! OK to call in #20 Thx

## 2016-04-27 NOTE — Telephone Encounter (Signed)
Pt called in said that her great niece was murdered and she wants xanex. She said that they she needs it to make it though it

## 2016-04-28 NOTE — Telephone Encounter (Signed)
Rf phoned in. Left detailed mess informing pt.  

## 2016-04-30 ENCOUNTER — Other Ambulatory Visit: Payer: Self-pay | Admitting: Internal Medicine

## 2016-05-05 NOTE — Telephone Encounter (Signed)
Done

## 2016-05-14 ENCOUNTER — Encounter (HOSPITAL_COMMUNITY): Payer: Self-pay

## 2016-05-21 DIAGNOSIS — Z01419 Encounter for gynecological examination (general) (routine) without abnormal findings: Secondary | ICD-10-CM | POA: Diagnosis not present

## 2016-05-21 DIAGNOSIS — Z6841 Body Mass Index (BMI) 40.0 and over, adult: Secondary | ICD-10-CM | POA: Diagnosis not present

## 2016-05-21 DIAGNOSIS — Z1231 Encounter for screening mammogram for malignant neoplasm of breast: Secondary | ICD-10-CM | POA: Diagnosis not present

## 2016-05-26 DIAGNOSIS — L300 Nummular dermatitis: Secondary | ICD-10-CM | POA: Diagnosis not present

## 2016-05-26 DIAGNOSIS — B079 Viral wart, unspecified: Secondary | ICD-10-CM | POA: Diagnosis not present

## 2016-06-18 ENCOUNTER — Other Ambulatory Visit (INDEPENDENT_AMBULATORY_CARE_PROVIDER_SITE_OTHER): Payer: BLUE CROSS/BLUE SHIELD

## 2016-06-18 ENCOUNTER — Encounter: Payer: Self-pay | Admitting: Internal Medicine

## 2016-06-18 ENCOUNTER — Other Ambulatory Visit: Payer: Self-pay

## 2016-06-18 ENCOUNTER — Ambulatory Visit (INDEPENDENT_AMBULATORY_CARE_PROVIDER_SITE_OTHER): Payer: BLUE CROSS/BLUE SHIELD | Admitting: Internal Medicine

## 2016-06-18 DIAGNOSIS — R000 Tachycardia, unspecified: Secondary | ICD-10-CM | POA: Diagnosis not present

## 2016-06-18 DIAGNOSIS — E559 Vitamin D deficiency, unspecified: Secondary | ICD-10-CM

## 2016-06-18 DIAGNOSIS — F988 Other specified behavioral and emotional disorders with onset usually occurring in childhood and adolescence: Secondary | ICD-10-CM | POA: Diagnosis not present

## 2016-06-18 DIAGNOSIS — E034 Atrophy of thyroid (acquired): Secondary | ICD-10-CM | POA: Diagnosis not present

## 2016-06-18 LAB — T4, FREE: FREE T4: 1.11 ng/dL (ref 0.60–1.60)

## 2016-06-18 LAB — TSH: TSH: 0.58 u[IU]/mL (ref 0.35–4.50)

## 2016-06-18 MED ORDER — AMPHETAMINE-DEXTROAMPHETAMINE 10 MG PO TABS: 10.0000 mg | ORAL_TABLET | Freq: Two times a day (BID) | ORAL | 0 refills | Status: DC

## 2016-06-18 NOTE — Assessment & Plan Note (Signed)
Levothroid po Labs

## 2016-06-18 NOTE — Progress Notes (Signed)
Pre visit review using our clinic review tool, if applicable. No additional management support is needed unless otherwise documented below in the visit note. 

## 2016-06-18 NOTE — Assessment & Plan Note (Signed)
Rocaltrol to re-try

## 2016-06-18 NOTE — Patient Instructions (Signed)
McKenzie inflatable back pillow  

## 2016-06-18 NOTE — Progress Notes (Signed)
Subjective:  Patient ID: Tiffany Medina, female    DOB: [birth year]  Age: 50 y.o. MRN: [id number]  CC: No chief complaint on file.   HPI Tiffany MagnusonKimberly Brissett presents for ADD, LBP, hypothyroidism  Outpatient Medications Prior to Visit  Medication Sig Dispense Refill  . Albuterol Sulfate (PROAIR RESPICLICK) 108 (90 BASE) MCG/ACT AEPB Inhale 1-2 puffs into the lungs 4 (four) times daily as needed. 1 each 11  . ALPRAZolam (XANAX) 0.5 MG tablet Take 1-2 tablets (0.5-1 mg total) by mouth 2 (two) times daily as needed for anxiety. 20 tablet 0  . amphetamine-dextroamphetamine (ADDERALL) 10 MG tablet Take 1 tablet (10 mg total) by mouth 2 (two) times daily. 60 tablet 0  . aspirin 325 MG tablet Take 1 tablet (325 mg total) by mouth 2 (two) times daily. 100 tablet 5  . Black Cohosh 540 MG CAPS 1 po qhs 90 capsule 3  . Cyanocobalamin 1000 MCG SUBL Place 1 tablet (1,000 mcg total) under the tongue daily. 100 tablet 3  . diphenhydrAMINE (BENADRYL) 25 MG tablet Take 1 tablet (25 mg total) by mouth every 6 (six) hours as needed for allergies. 100 tablet 3  . gabapentin (NEURONTIN) 300 MG capsule Take 1-2 capsules (300-600 mg total) by mouth at bedtime. Nerve pain 180 capsule 1  . levothyroxine (SYNTHROID, LEVOTHROID) 100 MCG tablet Take 1 tablet (100 mcg total) by mouth daily before breakfast. 100 tablet 3  . loratadine (CLARITIN) 10 MG tablet Take 1 tablet (10 mg total) by mouth daily. 100 tablet 3  . pantoprazole (PROTONIX) 40 MG tablet Take 1 tablet (40 mg total) by mouth daily. 30 tablet 11  . ranitidine (ZANTAC) 150 MG tablet Take 150 mg by mouth daily.    . traMADol (ULTRAM) 50 MG tablet TAKE 1 TABLET BY MOUTH TWICE DAILY 60 tablet 3  . triazolam (HALCION) 0.25 MG tablet TAKE 1 TABLET BY MOUTH AT BEDTIME AS NEEDED FOR SLEEP 30 tablet 3  . rOPINIRole (REQUIP) 0.5 MG tablet Take 1 tablet (0.5 mg total) by mouth at bedtime. (Patient not taking: Reported on 06/18/2016) 30 tablet 5  . triamcinolone cream  (KENALOG) 0.1 % Apply 1 application topically 2 (two) times daily. (Patient not taking: Reported on 06/18/2016) 160 g 3   No facility-administered medications prior to visit.     ROS Review of Systems  Constitutional: Positive for fatigue. Negative for activity change, appetite change, chills and unexpected weight change.  HENT: Negative for congestion, mouth sores and sinus pressure.   Eyes: Negative for visual disturbance.  Respiratory: Negative for cough and chest tightness.   Gastrointestinal: Negative for abdominal pain and nausea.  Genitourinary: Negative for difficulty urinating, frequency and vaginal pain.  Musculoskeletal: Positive for back pain. Negative for gait problem.  Skin: Negative for pallor and rash.  Neurological: Negative for dizziness, tremors, weakness, numbness and headaches.  Psychiatric/Behavioral: Positive for decreased concentration. Negative for confusion, sleep disturbance and suicidal ideas. The patient is nervous/anxious.     Objective:  BP 130/80   Pulse (!) 108   Wt 250 lb (113.4 kg)   SpO2 95%   BMI 41.60 kg/m   BP Readings from Last 3 Encounters:  06/18/16 130/80  03/26/16 120/70  01/02/16 140/90    Wt Readings from Last 3 Encounters:  06/18/16 250 lb (113.4 kg)  03/26/16 248 lb (112.5 kg)  01/02/16 249 lb (112.9 kg)    Physical Exam  Constitutional: She appears well-developed. No distress.  HENT:  Head: Normocephalic.  Right Ear:  External ear normal.  Left Ear: External ear normal.  Nose: Nose normal.  Mouth/Throat: Oropharynx is clear and moist.  Eyes: Conjunctivae are normal. Pupils are equal, round, and reactive to light. Right eye exhibits no discharge. Left eye exhibits no discharge.  Neck: Normal range of motion. Neck supple. No JVD present. No tracheal deviation present. No thyromegaly present.  Cardiovascular: Regular rhythm and normal heart sounds.   Pulmonary/Chest: No stridor. No respiratory distress. She has no wheezes.   Abdominal: Soft. Bowel sounds are normal. She exhibits no distension and no mass. There is no tenderness. There is no rebound and no guarding.  Musculoskeletal: She exhibits tenderness. She exhibits no edema.  Lymphadenopathy:    She has no cervical adenopathy.  Neurological: She displays normal reflexes. No cranial nerve deficit. She exhibits normal muscle tone. Coordination normal.  Skin: No rash noted. No erythema.  Psychiatric: She has a normal mood and affect. Her behavior is normal. Judgment and thought content normal.  Obese Tachycardic  Lab Results  Component Value Date   WBC 9.2 03/26/2016   HGB 14.9 03/26/2016   HCT 43.3 03/26/2016   PLT 394.0 03/26/2016   GLUCOSE 88 03/26/2016   CHOL 168 03/26/2016   TRIG 115.0 03/26/2016   HDL 49.30 03/26/2016   LDLCALC 95 03/26/2016   ALT 18 03/26/2016   AST 16 03/26/2016   NA 140 03/26/2016   K 4.4 03/26/2016   CL 102 03/26/2016   CREATININE 0.77 03/26/2016   BUN 15 03/26/2016   CO2 32 03/26/2016   TSH 0.33 (L) 03/26/2016   INR 1.02 11/28/2014    Mm Digital Screening Bilateral  Result Date: 04/21/2015 CLINICAL DATA:  Screening. EXAM: DIGITAL SCREENING BILATERAL MAMMOGRAM WITH CAD COMPARISON:  None. ACR Breast Density Category a: The breast tissue is almost entirely fatty. FINDINGS: There are no findings suspicious for malignancy. Images were processed with CAD. IMPRESSION: No mammographic evidence of malignancy. A result letter of this screening mammogram will be mailed directly to the patient. RECOMMENDATION: Screening mammogram in one year. (Code:SM-B-01Y) BI-RADS CATEGORY  1: Negative. Electronically Signed   By: Britta MccreedySusan  Turner M.D.   On: 04/21/2015 16:04    Assessment & Plan:   There are no diagnoses linked to this encounter. I am having Ms. Liszewski maintain her ranitidine, triamcinolone cream, Albuterol Sulfate, pantoprazole, rOPINIRole, levothyroxine, Cyanocobalamin, aspirin, diphenhydrAMINE, loratadine,  amphetamine-dextroamphetamine, gabapentin, Black Cohosh, ALPRAZolam, triazolam, and traMADol.  No orders of the defined types were placed in this encounter.    Follow-up: No Follow-up on file.  Sonda PrimesAlex Donatella Walski, MD

## 2016-06-18 NOTE — Assessment & Plan Note (Signed)
Adderall Rx Watch HR  Potential benefits of a long term amphetamines  use as well as potential risks  and complications were explained to the patient and were aknowledged.

## 2016-06-18 NOTE — Assessment & Plan Note (Signed)
Reduce caffeine

## 2016-07-02 ENCOUNTER — Other Ambulatory Visit: Payer: Self-pay | Admitting: Internal Medicine

## 2016-07-07 ENCOUNTER — Ambulatory Visit (INDEPENDENT_AMBULATORY_CARE_PROVIDER_SITE_OTHER): Payer: BLUE CROSS/BLUE SHIELD | Admitting: Internal Medicine

## 2016-07-07 ENCOUNTER — Encounter: Payer: Self-pay | Admitting: Internal Medicine

## 2016-07-07 DIAGNOSIS — J209 Acute bronchitis, unspecified: Secondary | ICD-10-CM | POA: Diagnosis not present

## 2016-07-07 DIAGNOSIS — J020 Streptococcal pharyngitis: Secondary | ICD-10-CM

## 2016-07-07 DIAGNOSIS — R000 Tachycardia, unspecified: Secondary | ICD-10-CM | POA: Diagnosis not present

## 2016-07-07 DIAGNOSIS — F439 Reaction to severe stress, unspecified: Secondary | ICD-10-CM

## 2016-07-07 LAB — POCT RAPID STREP A (OFFICE): RAPID STREP A SCREEN: NEGATIVE

## 2016-07-07 MED ORDER — AZITHROMYCIN 250 MG PO TABS
ORAL_TABLET | ORAL | 0 refills | Status: DC
Start: 2016-07-07 — End: 2016-08-25

## 2016-07-07 MED ORDER — ALPRAZOLAM 0.5 MG PO TABS: 0.5000 mg | ORAL_TABLET | Freq: Two times a day (BID) | ORAL | 0 refills | Status: DC | PRN

## 2016-07-07 MED ORDER — FLUCONAZOLE 150 MG PO TABS: 150.0000 mg | ORAL_TABLET | Freq: Once | ORAL | 0 refills | Status: AC

## 2016-07-07 MED ORDER — HYDROCOD POLST-CPM POLST ER 10-8 MG/5ML PO SUER: 5.0000 mL | Freq: Two times a day (BID) | ORAL | 0 refills | Status: DC | PRN

## 2016-07-07 NOTE — Assessment & Plan Note (Addendum)
Z pac Strep test Tussionex prn

## 2016-07-07 NOTE — Assessment & Plan Note (Signed)
No change 

## 2016-07-07 NOTE — Progress Notes (Signed)
Subjective:  Patient ID: Tiffany Medina, female    DOB: 14-Jun-1966  Age: 51 y.o. MRN: [id number]  CC: Sore Throat and Cough   HPI Tiffany MagnusonKimberly Reitman presents for ST, cough x since weekend. C/o stress  Outpatient Medications Prior to Visit  Medication Sig Dispense Refill  . Albuterol Sulfate (PROAIR RESPICLICK) 108 (90 BASE) MCG/ACT AEPB Inhale 1-2 puffs into the lungs 4 (four) times daily as needed. 1 each 11  . amphetamine-dextroamphetamine (ADDERALL) 10 MG tablet Take 1 tablet (10 mg total) by mouth 2 (two) times daily. 60 tablet 0  . aspirin 325 MG tablet Take 1 tablet (325 mg total) by mouth 2 (two) times daily. 100 tablet 5  . Black Cohosh 540 MG CAPS 1 po qhs 90 capsule 3  . Cyanocobalamin 1000 MCG SUBL Place 1 tablet (1,000 mcg total) under the tongue daily. 100 tablet 3  . diphenhydrAMINE (BENADRYL) 25 MG tablet Take 1 tablet (25 mg total) by mouth every 6 (six) hours as needed for allergies. 100 tablet 3  . gabapentin (NEURONTIN) 300 MG capsule Take 1-2 capsules (300-600 mg total) by mouth at bedtime. Nerve pain 180 capsule 1  . levothyroxine (SYNTHROID, LEVOTHROID) 100 MCG tablet Take 1 tablet (100 mcg total) by mouth daily before breakfast. 100 tablet 3  . loratadine (CLARITIN) 10 MG tablet Take 1 tablet (10 mg total) by mouth daily. 100 tablet 3  . pantoprazole (PROTONIX) 40 MG tablet Take 1 tablet (40 mg total) by mouth daily. 30 tablet 11  . ranitidine (ZANTAC) 150 MG tablet Take 150 mg by mouth daily.    Marland Kitchen. rOPINIRole (REQUIP) 0.5 MG tablet Take 1 tablet (0.5 mg total) by mouth at bedtime. 30 tablet 5  . traMADol (ULTRAM) 50 MG tablet TAKE 1 TABLET BY MOUTH TWICE DAILY 60 tablet 3  . triamcinolone cream (KENALOG) 0.1 % Apply 1 application topically 2 (two) times daily. 160 g 3  . triazolam (HALCION) 0.25 MG tablet TAKE 1 TABLET BY MOUTH AT BEDTIME AS NEEDED FOR SLEEP 30 tablet 3  . ALPRAZolam (XANAX) 0.5 MG tablet Take 1-2 tablets (0.5-1 mg total) by mouth 2 (two) times daily as  needed for anxiety. (Patient not taking: Reported on 07/07/2016) 20 tablet 0   No facility-administered medications prior to visit.     ROS Review of Systems  Constitutional: Positive for fatigue and fever. Negative for activity change, appetite change, chills and unexpected weight change.  HENT: Positive for postnasal drip and sore throat. Negative for congestion, mouth sores and sinus pressure.   Eyes: Negative for visual disturbance.  Respiratory: Positive for cough. Negative for chest tightness.   Gastrointestinal: Negative for abdominal pain and nausea.  Genitourinary: Negative for difficulty urinating, frequency and vaginal pain.  Musculoskeletal: Negative for back pain and gait problem.  Skin: Negative for pallor and rash.  Neurological: Negative for dizziness, tremors, weakness, numbness and headaches.  Psychiatric/Behavioral: Negative for confusion and sleep disturbance. The patient is nervous/anxious.     Objective:  BP 130/90   Pulse (!) 108   Temp 97.9 F (36.6 C) (Oral)   Wt 242 lb (109.8 kg)   SpO2 96%   BMI 40.27 kg/m   BP Readings from Last 3 Encounters:  07/07/16 130/90  06/18/16 130/80  03/26/16 120/70    Wt Readings from Last 3 Encounters:  07/07/16 242 lb (109.8 kg)  06/18/16 250 lb (113.4 kg)  03/26/16 248 lb (112.5 kg)    Physical Exam  Constitutional: She appears well-developed. No distress.  HENT:  Head: Normocephalic.  Right Ear: External ear normal.  Left Ear: External ear normal.  Eyes: Conjunctivae are normal. Pupils are equal, round, and reactive to light. Right eye exhibits no discharge. Left eye exhibits no discharge.  Neck: Normal range of motion. Neck supple. No JVD present. No tracheal deviation present. No thyromegaly present.  Cardiovascular: Regular rhythm and normal heart sounds.   Pulmonary/Chest: No stridor. No respiratory distress. She has no wheezes.  Abdominal: Soft. Bowel sounds are normal. She exhibits no distension and no  mass. There is no tenderness. There is no rebound and no guarding.  Musculoskeletal: She exhibits no edema or tenderness.  Lymphadenopathy:    She has no cervical adenopathy.  Neurological: She displays normal reflexes. No cranial nerve deficit. She exhibits normal muscle tone. Coordination normal.  Skin: No rash noted. No erythema.  Psychiatric: She has a normal mood and affect. Her behavior is normal. Judgment and thought content normal.  eryth throat Tearful Tachycardic Lab Results  Component Value Date   WBC 9.2 03/26/2016   HGB 14.9 03/26/2016   HCT 43.3 03/26/2016   PLT 394.0 03/26/2016   GLUCOSE 88 03/26/2016   CHOL 168 03/26/2016   TRIG 115.0 03/26/2016   HDL 49.30 03/26/2016   LDLCALC 95 03/26/2016   ALT 18 03/26/2016   AST 16 03/26/2016   NA 140 03/26/2016   K 4.4 03/26/2016   CL 102 03/26/2016   CREATININE 0.77 03/26/2016   BUN 15 03/26/2016   CO2 32 03/26/2016   TSH 0.58 06/18/2016   INR 1.02 11/28/2014    Mm Digital Screening Bilateral  Result Date: 04/21/2015 CLINICAL DATA:  Screening. EXAM: DIGITAL SCREENING BILATERAL MAMMOGRAM WITH CAD COMPARISON:  None. ACR Breast Density Category a: The breast tissue is almost entirely fatty. FINDINGS: There are no findings suspicious for malignancy. Images were processed with CAD. IMPRESSION: No mammographic evidence of malignancy. A result letter of this screening mammogram will be mailed directly to the patient. RECOMMENDATION: Screening mammogram in one year. (Code:SM-B-01Y) BI-RADS CATEGORY  1: Negative. Electronically Signed   By: Britta Mccreedy M.D.   On: 04/21/2015 16:04    Assessment & Plan:   There are no diagnoses linked to this encounter. I am having Ms. Stegall maintain her ranitidine, triamcinolone cream, Albuterol Sulfate, pantoprazole, rOPINIRole, levothyroxine, Cyanocobalamin, aspirin, diphenhydrAMINE, loratadine, gabapentin, Black Cohosh, ALPRAZolam, triazolam, traMADol, and  amphetamine-dextroamphetamine.  No orders of the defined types were placed in this encounter.    Follow-up: No Follow-up on file.  Sonda Primes, MD

## 2016-07-07 NOTE — Progress Notes (Signed)
Pre visit review using our clinic review tool, if applicable. No additional management support is needed unless otherwise documented below in the visit note. 

## 2016-07-07 NOTE — Assessment & Plan Note (Signed)
Xanax prn short term - family stress  Potential benefits of benzodiazepines  use as well as potential risks  and complications were explained to the patient and were aknowledged. 

## 2016-07-10 ENCOUNTER — Other Ambulatory Visit: Payer: Self-pay | Admitting: Internal Medicine

## 2016-07-16 ENCOUNTER — Other Ambulatory Visit: Payer: Self-pay | Admitting: Internal Medicine

## 2016-07-16 DIAGNOSIS — E559 Vitamin D deficiency, unspecified: Secondary | ICD-10-CM

## 2016-07-23 NOTE — Telephone Encounter (Signed)
Signed rx is upfront. Left detailed mess informing pt.

## 2016-08-03 ENCOUNTER — Encounter: Payer: Self-pay | Admitting: Endocrinology

## 2016-08-13 ENCOUNTER — Other Ambulatory Visit: Payer: Self-pay | Admitting: Internal Medicine

## 2016-08-16 ENCOUNTER — Telehealth: Payer: Self-pay | Admitting: *Deleted

## 2016-08-16 NOTE — Telephone Encounter (Signed)
Rec'd fax pt requesting refills on her alprazolam.../lmb

## 2016-08-16 NOTE — Telephone Encounter (Signed)
OK to fill this/these prescription(s) with additional refills x0 Thank you!  

## 2016-08-17 MED ORDER — ALPRAZOLAM 0.5 MG PO TABS: 0.5000 mg | ORAL_TABLET | Freq: Two times a day (BID) | ORAL | 0 refills | Status: DC | PRN

## 2016-08-17 NOTE — Telephone Encounter (Signed)
Print[facility] script & [facility]...Raechel Chute/lmb

## 2016-08-25 ENCOUNTER — Ambulatory Visit (INDEPENDENT_AMBULATORY_CARE_PROVIDER_SITE_OTHER): Payer: BLUE CROSS/BLUE SHIELD | Admitting: Endocrinology

## 2016-08-25 ENCOUNTER — Encounter: Payer: Self-pay | Admitting: Endocrinology

## 2016-08-25 DIAGNOSIS — Z780 Asymptomatic menopausal state: Secondary | ICD-10-CM | POA: Diagnosis not present

## 2016-08-25 DIAGNOSIS — N958 Other specified menopausal and perimenopausal disorders: Secondary | ICD-10-CM | POA: Diagnosis not present

## 2016-08-25 NOTE — Progress Notes (Signed)
Subjective:    Patient ID: Tiffany Medina, female    DOB: [birth year], 51 y.o.   MRN: [id number]  HPI Pt is referred by Dr Alain Marion, for mildly low vit-D.  She took a non-prescription supplement for a brief time in 2017, but she stopped due to redness of the neck, but no assoc itching.  only bony fracture has been right great toe, and this was many years ago.  She has no history of any of the following: early menopause, multiple myeloma, renal dz, hyperthyroidism, prolonged bedrest, alcoholism, osteoporosis, liver dz, hereditary syndromes, primary hyperparathyroidism.  She does not take heparin or anticonvulsants.   She denies h/o the following: bariatric surgery, seizures, pancreatitis, malabsorption, eating disorder, neck surgery, or chelation rx.  She has taken several courses of prednisone, but not recently.  She is a smoker.  She has slight bloating of the abdomen, and assoc heartburn.   Past Medical History:  Diagnosis Date  . Anxiety   . Asthma   . Depression   . Discoid lupus   . DVT (deep venous thrombosis) ([place])   . Family history of breast cancer   . Family history of colon cancer   . GERD (gastroesophageal reflux disease)   . Lupus   . Sleep apnea     Past Surgical History:  Procedure Laterality Date  . ABDOMINAL HYSTERECTOMY    . APPENDECTOMY    . TUBAL LIGATION  1987  . VEIN LIGATION AND STRIPPING      Social History   Social History  . Marital status: Married    Spouse name: N/A  . Number of children: 1  . Years of education: College   Occupational History  . 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 Smoking status: Current Every Day Smoker    Packs/day: 1.00    Types: Cigarettes  . Smokeless tobacco: Never Used  . Alcohol use No  . Drug use: No  . Sexual activity: Yes   Other Topics Concern  . Not on file   Social History Narrative   Drinks 2 cups of coffee a day and 2 Dt Mt Dews    Current Outpatient Prescriptions on File Prior to  Visit  Medication Sig Dispense Refill  . Albuterol Sulfate (PROAIR RESPICLICK) 681 (90 BASE) MCG/ACT AEPB Inhale 1-2 puffs into the lungs 4 (four) times daily as needed. 1 each 11  . amphetamine-dextroamphetamine (ADDERALL) 10 MG tablet TAKE 1 TABLET TWICE DAILY 60 tablet 0  . aspirin 325 MG tablet Take 1 tablet (325 mg total) by mouth 2 (two) times daily. 100 tablet 5  . Black Cohosh 540 MG CAPS 1 po qhs 90 capsule 3  . Cyanocobalamin 1000 MCG SUBL Place 1 tablet (1,000 mcg total) under the tongue daily. 100 tablet 3  . diphenhydrAMINE (BENADRYL) 25 MG tablet Take 1 tablet (25 mg total) by mouth every 6 (six) hours as needed for allergies. 100 tablet 3  . gabapentin (NEURONTIN) 300 MG capsule Take 1-2 capsules (300-600 mg total) by mouth at bedtime. Nerve pain 180 capsule 1  . levothyroxine (SYNTHROID, LEVOTHROID) 100 MCG tablet Take 1 tablet (100 mcg total) by mouth daily before breakfast. 100 tablet 3  . loratadine (CLARITIN) 10 MG tablet Take 1 tablet (10 mg total) by mouth daily. 100 tablet 3  . pantoprazole (PROTONIX) 40 MG tablet Take 1 tablet (40 mg total) by mouth daily. 30 tablet 11  . ranitidine (ZANTAC) 150 MG tablet Take  150 mg by mouth daily.    Marland Kitchen rOPINIRole (REQUIP) 0.5 MG tablet Take 1 tablet (0.5 mg total) by mouth at bedtime. 30 tablet 5  . traMADol (ULTRAM) 50 MG tablet TAKE 1 TABLET BY MOUTH TWICE DAILY 60 tablet 3  . triazolam (HALCION) 0.25 MG tablet TAKE 1 TABLET BY MOUTH AT BEDTIME AS NEEDED FOR SLEEP 30 tablet 3  . ALPRAZolam (XANAX) 0.5 MG tablet Take 1-2 tablets (0.5-1 mg total) by mouth 2 (two) times daily as needed for anxiety. (Patient not taking: Reported on 08/25/2016) 20 tablet 0   No current facility-administered medications on file prior to visit.     Allergies  Allergen Reactions  . Vit D-Vit E-Safflower Oil Hives    rash    Family History  Problem Relation Age of Onset  . Breast cancer Mother 29    bilateral breast cancer  . Hodgkin's lymphoma Father      dx in his 60s  . Lung cancer Father 76  . Lung cancer Sister 77    adenocarcinoma (also a smoker)  . Lupus Maternal Uncle   . Colon cancer Maternal Uncle     dx in his 31s  . Cancer Paternal Uncle     2 paternal uncles with cancer NOS  . Diabetes Maternal Grandmother   . Colon cancer Maternal Grandfather     late 81s  . Diabetes Paternal Grandmother   . Colon cancer Paternal Grandfather 79  . Breast cancer Sister 12  . Cancer Other   . Diabetes Other   . Mental illness Other     BP 128/86   Pulse (!) 106   Ht '5\' 5"'  (1.651 m)   Wt 248 lb (112.5 kg)   SpO2 91%   BMI 41.27 kg/m    Review of Systems denies weight loss, hematuria, diarrhea, cold intolerance, falls, memory loss, and rhinorrhea.  She has slight leg edema, easy bruising, low back pain, leg cramps, and insomnia.     Objective:   Physical Exam VS: see vs page GEN: no distress HEAD: head: no deformity eyes: no periorbital swelling, no proptosis external nose and ears are normal.  mouth: no lesion seen NECK: supple, thyroid is not enlarged CHEST WALL: no deformity.  No kyphosis. LUNGS: clear to auscultation CV: reg rate and rhythm, no murmur ABD: abdomen is soft, nontender.  no hepatosplenomegaly.  not distended.  no hernia MUSCULOSKELETAL: muscle bulk and strength are grossly normal.  no obvious joint swelling.  gait is normal and steady EXTEMITIES: no deformity.  no ulcer on the feet.  feet are of normal color and temp.  Trace left leg edema PULSES: dorsalis pedis intact bilat.  no carotid bruit NEURO:  cn 2-12 grossly intact.   readily moves all 4's.  sensation is intact to touch on the feet SKIN:  Normal texture and temperature.  No rash or suspicious lesion is visible.   NODES:  None palpable at the neck PSYCH: alert, well-oriented.  Does not appear anxious nor depressed.    I have reviewed outside records, and summarized: Pt was noted to have low vit-D, and referred here. Other chronic probs were  noted to be stable.  She was feeling well, except for URI and anxiety.   Lab Results  Component Value Date   TSH 0.58 06/18/2016   Lab Results  Component Value Date   CALCIUM 9.8 03/26/2016   25-OH vit-D=25    Assessment & Plan:  Vit-D deficiency, mild Menopause, new to me. abd  bloating: if she is found to have osteoporosis, reclast would be favored over fosamax.  Patient is advised the following: Patient Instructions  Let's check a bone-density test.  Take non-prescription vitamin-D (cholecalciferol or ergocalciferol--whichever is different from the one you took last year), 2000 units per day.   I would be happy to see you back here as needed.

## 2016-08-25 NOTE — Patient Instructions (Signed)
Let's check a bone-density test.  Take non-prescription vitamin-D (cholecalciferol or ergocalciferol--whichever is different from the one you took last year), 2000 units per day.   I would be happy to see you back here as needed.

## 2016-09-03 ENCOUNTER — Other Ambulatory Visit: Payer: Self-pay | Admitting: Internal Medicine

## 2016-09-08 ENCOUNTER — Other Ambulatory Visit: Payer: Self-pay

## 2016-09-08 ENCOUNTER — Other Ambulatory Visit: Payer: Self-pay | Admitting: Internal Medicine

## 2016-09-12 ENCOUNTER — Other Ambulatory Visit: Payer: Self-pay | Admitting: Internal Medicine

## 2016-09-16 NOTE — Telephone Encounter (Signed)
OV q 3 mo 

## 2016-09-17 ENCOUNTER — Ambulatory Visit (INDEPENDENT_AMBULATORY_CARE_PROVIDER_SITE_OTHER)
Admission: RE | Admit: 2016-09-17 | Discharge: 2016-09-17 | Disposition: A | Discharge status: Home / Self Care | Payer: BLUE CROSS/BLUE SHIELD | Source: Ambulatory Visit | Attending: Endocrinology | Admitting: Endocrinology

## 2016-09-17 DIAGNOSIS — Z780 Asymptomatic menopausal state: Secondary | ICD-10-CM

## 2016-09-17 MED ORDER — TRAMADOL HCL 50 MG PO TABS: 50.0000 mg | ORAL_TABLET | Freq: Two times a day (BID) | ORAL | 3 refills | Status: DC

## 2016-09-17 MED ORDER — TRIAZOLAM 0.25 MG PO TABS: ORAL_TABLET | ORAL | 3 refills | Status: DC

## 2016-09-17 NOTE — Addendum Note (Signed)
Addended by: Osa CraverGUIJOZA, Dmiyah Liscano M on: 09/17/2016 12:33 PM   Modules accepted: Orders

## 2016-09-17 NOTE — Telephone Encounter (Signed)
Medication sent to pharmacy  

## 2016-09-20 NOTE — Telephone Encounter (Signed)
Pt never came to pick up script. Rx has been shredded...Redgie Grayer/lbm

## 2016-09-22 ENCOUNTER — Encounter (HOSPITAL_COMMUNITY): Payer: Self-pay | Admitting: Emergency Medicine

## 2016-09-22 ENCOUNTER — Emergency Department (HOSPITAL_COMMUNITY)
Admission: EM | Admit: 2016-09-22 | Discharge: 2016-09-23 | Disposition: A | Discharge status: Home / Self Care | Payer: BLUE CROSS/BLUE SHIELD | Attending: Emergency Medicine | Admitting: Emergency Medicine

## 2016-09-22 ENCOUNTER — Emergency Department (HOSPITAL_COMMUNITY): Payer: BLUE CROSS/BLUE SHIELD

## 2016-09-22 DIAGNOSIS — E039 Hypothyroidism, unspecified: Secondary | ICD-10-CM | POA: Diagnosis not present

## 2016-09-22 DIAGNOSIS — J45909 Unspecified asthma, uncomplicated: Secondary | ICD-10-CM | POA: Insufficient documentation

## 2016-09-22 DIAGNOSIS — I10 Essential (primary) hypertension: Secondary | ICD-10-CM | POA: Insufficient documentation

## 2016-09-22 DIAGNOSIS — Z79899 Other long term (current) drug therapy: Secondary | ICD-10-CM | POA: Diagnosis not present

## 2016-09-22 DIAGNOSIS — F909 Attention-deficit hyperactivity disorder, unspecified type: Secondary | ICD-10-CM | POA: Diagnosis not present

## 2016-09-22 DIAGNOSIS — R030 Elevated blood-pressure reading, without diagnosis of hypertension: Secondary | ICD-10-CM

## 2016-09-22 DIAGNOSIS — F17210 Nicotine dependence, cigarettes, uncomplicated: Secondary | ICD-10-CM | POA: Diagnosis not present

## 2016-09-22 DIAGNOSIS — H538 Other visual disturbances: Secondary | ICD-10-CM | POA: Insufficient documentation

## 2016-09-22 LAB — URINALYSIS, ROUTINE W REFLEX MICROSCOPIC
Bacteria, UA: NONE SEEN
Bilirubin Urine: NEGATIVE
Glucose, UA: NEGATIVE mg/dL
Hgb urine dipstick: NEGATIVE
Ketones, ur: NEGATIVE mg/dL
Nitrite: NEGATIVE
Protein, ur: NEGATIVE mg/dL
RBC / HPF: NONE SEEN RBC/hpf (ref 0–5)
Specific Gravity, Urine: 1.003 — ABNORMAL LOW (ref 1.005–1.030)
pH: 7 (ref 5.0–8.0)

## 2016-09-22 LAB — BASIC METABOLIC PANEL
Anion gap: 9 (ref 5–15)
BUN: 11 mg/dL (ref 6–20)
CO2: 28 mmol/L (ref 22–32)
Calcium: 9.6 mg/dL (ref 8.9–10.3)
Chloride: 102 mmol/L (ref 101–111)
Creatinine, Ser: 0.76 mg/dL (ref 0.44–1.00)
GFR calc Af Amer: >60 mL/min (ref >60)
GFR calc non Af Amer: >60 mL/min (ref >60)
Glucose, Bld: 99 mg/dL (ref 65–99)
Potassium: 4 mmol/L (ref 3.5–5.1)
Sodium: 139 mmol/L (ref 135–145)

## 2016-09-22 MED ORDER — FLUORESCEIN SODIUM 0.6 MG OP STRP
1.0000 | ORAL_STRIP | Freq: Once | OPHTHALMIC | Status: AC
  Administered 2016-09-22: 1 via OPHTHALMIC
  Filled 2016-09-22: qty 1

## 2016-09-22 MED ORDER — TETRACAINE HCL 0.5 % OP SOLN
2.0000 [drp] | Freq: Once | OPHTHALMIC | Status: AC
  Administered 2016-09-22: 2 [drp] via OPHTHALMIC
  Filled 2016-09-22: qty 2

## 2016-09-22 NOTE — ED Notes (Signed)
Collect lavender top

## 2016-09-22 NOTE — ED Triage Notes (Signed)
Pt reports today she began to have blurred vision while at work. Vision got worse, pt is a Engineer, civil (consulting)nurse. Took her own blood pressure and it was 150/100. Pt also feels like she has a knot in her throat. Pt also has increased heart burn. Pt denies chest pain. Denies cardiac history.

## 2016-09-23 LAB — CBC WITH DIFFERENTIAL/PLATELET
Basophils Absolute: 0 10*3/uL (ref 0.0–0.1)
Basophils Relative: 0 %
EOS ABS: 0.2 10*3/uL (ref 0.0–0.7)
EOS PCT: 2 %
HCT: 42.1 % (ref 36.0–46.0)
Hemoglobin: 14.1 g/dL (ref 12.0–15.0)
LYMPHS ABS: 2.1 10*3/uL (ref 0.7–4.0)
LYMPHS PCT: 31 %
MCH: 30.9 pg (ref 26.0–34.0)
MCHC: 33.5 g/dL (ref 30.0–36.0)
MCV: 92.1 fL (ref 78.0–100.0)
MONO ABS: 0.6 10*3/uL (ref 0.1–1.0)
MONOS PCT: 9 %
Neutro Abs: 3.8 10*3/uL (ref 1.7–7.7)
Neutrophils Relative %: 58 %
Platelets: 342 10*3/uL (ref 150–400)
RBC: 4.57 MIL/uL (ref 3.87–5.11)
RDW: 13.6 % (ref 11.5–15.5)
WBC: 6.6 10*3/uL (ref 4.0–10.5)

## 2016-09-23 MED ORDER — BACITRACIN-POLYMYXIN B 500-10000 UNIT/GM OP OINT: 1.0000 "application " | TOPICAL_OINTMENT | Freq: Three times a day (TID) | OPHTHALMIC | 0 refills | Status: DC

## 2016-09-23 NOTE — Discharge Instructions (Signed)
Follow-up with your primary care doctor.  Return here as needed °

## 2016-09-23 NOTE — ED Provider Notes (Signed)
[facility]-EMERGENCY DEPT Provider Note   CSN: [id number] Arrival date & time: 09/22/16  1756     History   Chief Complaint Chief Complaint  Patient presents with  . Hypertension  . Blurred Vision    HPI Tiffany Medina is a 51 y.o. female.  HPI Patient presents to the emergency department with elevated blood pressure, along with some blurring of her vision today at work.  Patient states that her blood pressure was elevated.  She states that she does not take medication for her blood pressure.  Patient states that nothing seems make her condition better or worse.  She states she did not take any medications prior to arrival.  She states that she feels like she has a contact lens stuck in her right eye. The patient denies chest pain, shortness of breath, headache neck pain, fever, cough, weakness, numbness, dizziness, anorexia, edema, abdominal pain, nausea, vomiting, diarrhea, rash, back pain, dysuria, hematemesis, bloody stool, near syncope, or syncope. Past Medical History:  Diagnosis Date  . Anxiety   . Asthma   . Depression   . Discoid lupus   . DVT (deep venous thrombosis) (HCC)   . Family history of breast cancer   . Family history of colon cancer   . GERD (gastroesophageal reflux disease)   . Lupus   . Sleep apnea     Patient Active Problem List   Diagnosis Date Noted  . Artificial menopause state 08/25/2016  . Asymptomatic menopausal state 08/25/2016  . Stress at home 07/07/2016  . Insomnia 01/02/2016  . GERD (gastroesophageal reflux disease) 09/01/2015  . Acute bronchitis 06/27/2015  . Wheezing 06/27/2015  . Genetic testing 06/20/2015  . Vitamin D deficiency 06/09/2015  . Family history of colon cancer   . Family history of breast cancer   . Cervical adenopathy 04/14/2015  . Tachycardia 04/14/2015  . Rash and nonspecific skin eruption 01/22/2015  . ADD (attention deficit disorder) 01/22/2015  . Restless leg syndrome 12/09/2014  . CFS (chronic fatigue syndrome)  12/09/2014  . Obesity, morbid (HCC) 12/09/2014  . OSA (obstructive sleep apnea) 12/09/2014  . Hypothyroidism 12/09/2014  . Paresthesia 12/09/2014  . Heavy tobacco smoker >10 cigarettes per day 12/09/2014  . History of DVT of lower extremity 12/09/2014  . Discoid lupus 12/09/2014    Past Surgical History:  Procedure Laterality Date  . ABDOMINAL HYSTERECTOMY    . APPENDECTOMY    . TUBAL LIGATION  1987  . VEIN LIGATION AND STRIPPING      OB History    No data available       Home Medications    Prior to Admission medications   Medication Sig Start Date End Date Taking? Authorizing Provider  Albuterol Sulfate (PROAIR RESPICLICK) 108 (90 BASE) MCG/ACT AEPB Inhale 1-2 puffs into the lungs 4 (four) times daily as needed. 06/27/15  Yes Aleksei Plotnikov V, MD  ALPRAZolam (XANAX) 0.5 MG tablet Take 1-2 tablets (0.5-1 mg total) by mouth 2 (two) times daily as needed for anxiety. 08/17/16 08/17/17 Yes Aleksei Plotnikov V, MD  amphetamine-dextroamphetamine (ADDERALL) 10 MG tablet TAKE 1 TABLET TWICE DAILY 07/23/16  Yes Aleksei Plotnikov V, MD  Black Cohosh 540 MG CAPS 1 po qhs 03/26/16  Yes Aleksei Plotnikov V, MD  COCONUT OIL PO Take 2 capsules by mouth 2 (two) times daily.   Yes Historical Provider, MD  Cyanocobalamin 1000 MCG SUBL Place 1 tablet (1,000 mcg total) under the tongue daily. 01/02/16  Yes Aleksei Plotnikov V, MD  diphenhydrAMINE (BENADRYL) 25 MG  tablet Take 1 tablet (25 mg total) by mouth every 6 (six) hours as needed for allergies. 01/02/16  Yes Aleksei Plotnikov V, MD  gabapentin (NEURONTIN) 300 MG capsule Take 1-2 capsules (300-600 mg total) by mouth at bedtime. Nerve pain Patient taking differently: Take 600 mg by mouth at bedtime. Nerve pain 03/26/16  Yes Aleksei Plotnikov V, MD  GOODSENSE ASPIRIN 325 MG tablet TAKE 1 TABLET BY MOUTH TWICE DAILY 09/03/16  Yes Aleksei Plotnikov V, MD  levothyroxine (SYNTHROID, LEVOTHROID) 100 MCG tablet Take 1 tablet (100 mcg total) by mouth daily  before breakfast. 01/02/16  Yes Aleksei Plotnikov V, MD  loratadine (CLARITIN) 10 MG tablet Take 1 tablet (10 mg total) by mouth daily. 01/02/16  Yes Aleksei Plotnikov V, MD  pantoprazole (PROTONIX) 40 MG tablet TAKE 1 TABLET EVERY DAY 09/08/16  Yes Aleksei Plotnikov V, MD  ranitidine (ZANTAC) 150 MG tablet Take 150 mg by mouth daily.   Yes Historical Provider, MD  traMADol (ULTRAM) 50 MG tablet Take 1 tablet (50 mg total) by mouth 2 (two) times daily. Patient taking differently: Take 100 mg by mouth at bedtime.  09/17/16  Yes Aleksei Plotnikov V, MD  triazolam (HALCION) 0.25 MG tablet TAKE 1 TABLET BY MOUTH NIGHTLY AT BEDTIME AS NEEDED FOR SLEEP. Patient taking differently: Take 0.25 mg by mouth at bedtime.  09/17/16  Yes Tresa Garter, MD    Family History Family History  Problem Relation Age of Onset  . Breast cancer Mother 6    bilateral breast cancer  . Hodgkin's lymphoma Father     dx in his 66s  . Lung cancer Father 40  . Lung cancer Sister 49    adenocarcinoma (also a smoker)  . Lupus Maternal Uncle   . Colon cancer Maternal Uncle     dx in his 77s  . Cancer Paternal Uncle     2 paternal uncles with cancer NOS  . Diabetes Maternal Grandmother   . Colon cancer Maternal Grandfather     late 63s  . Diabetes Paternal Grandmother   . Colon cancer Paternal Grandfather 69  . Breast cancer Sister 44  . Cancer Other   . Diabetes Other   . Mental illness Other     Social History Social History  Substance Use Topics  . Smoking status: Current Every Day Smoker    Packs/day: 1.00    Types: Cigarettes  . Smokeless tobacco: Never Used  . Alcohol use No     Allergies   Vitamin d analogs   Review of Systems Review of Systems All other systems negative except as documented in the HPI. All pertinent positives and negatives as reviewed in the HPI. Physical Exam Updated Vital Signs BP 115/72   Pulse (!) 102   Temp 98.9 F (37.2 C) (Oral)   Resp 18   SpO2 (!) 89%    Physical Exam  Constitutional: She is oriented to person, place, and time. She appears well-developed and well-nourished. No distress.  HENT:  Head: Normocephalic and atraumatic.  Mouth/Throat: Oropharynx is clear and moist.  Eyes: Pupils are equal, round, and reactive to light.  Neck: Normal range of motion. Neck supple.  Cardiovascular: Normal rate, regular rhythm and normal heart sounds.  Exam reveals no gallop and no friction rub.   No murmur heard. Pulmonary/Chest: Effort normal and breath sounds normal. No respiratory distress. She has no wheezes.  Abdominal: Soft. Bowel sounds are normal. She exhibits no distension. There is no tenderness.  Neurological: She is  alert and oriented to person, place, and time. She exhibits normal muscle tone. Coordination normal.  Skin: Skin is warm and dry. Capillary refill takes less than 2 seconds. No rash noted. No erythema.  Psychiatric: She has a normal mood and affect. Her behavior is normal.  Nursing note and vitals reviewed.    ED Treatments / Results  Labs (all labs ordered are listed, but only abnormal results are displayed) Labs Reviewed  URINALYSIS, ROUTINE W REFLEX MICROSCOPIC - Abnormal; Notable for the following:       Result Value   Color, Urine STRAW (*)    Specific Gravity, Urine 1.003 (*)    Leukocytes, UA TRACE (*)    Squamous Epithelial / LPF 0-5 (*)    All other components within normal limits  BASIC METABOLIC PANEL  CBC WITH DIFFERENTIAL/PLATELET  CBC WITH DIFFERENTIAL/PLATELET    EKG  EKG Interpretation None       Radiology Ct Head Wo Contrast  Result Date: 09/22/2016 CLINICAL DATA:  Blurry vision, increased blood pressure starting this morning EXAM: CT HEAD WITHOUT CONTRAST TECHNIQUE: Contiguous axial images were obtained from the base of the skull through the vertex without intravenous contrast. COMPARISON:  None. FINDINGS: Brain: No intracranial hemorrhage, mass effect or midline shift. No acute cortical  infarction. No mass lesion is noted on this unenhanced scan. No hydrocephalus. The gray and white-matter differentiation is preserved. Vascular: No hyperdense vessel or unexpected calcification. Skull: Normal. Negative for fracture or focal lesion. Sinuses/Orbits: No acute finding. Other: None. IMPRESSION: No acute intracranial abnormality. No definite acute cortical infarction. Electronically Signed   By: Natasha MeadLiviu  Pop M.D.   On: 09/22/2016 18:42    Procedures Procedures (including critical care time)  Medications Ordered in ED Medications  tetracaine (PONTOCAINE) 0.5 % ophthalmic solution 2 drop (2 drops Right Eye Given 09/22/16 2342)  fluorescein ophthalmic strip 1 strip (1 strip Both Eyes Given 09/22/16 2342)     Initial Impression / Assessment and Plan / ED Course  I have reviewed the triage vital signs and the nursing notes.  Pertinent labs & imaging results that were available during my care of the patient were reviewed by me and considered in my medical decision making (see chart for details).     Patient will need follow-up with her primary care doctor is her blood pressure is slightly elevated.  I did advise the patient of the plan and all questions were answered.  The patient does not have any signs of end organ damage at this point, I have advised her that her neurological examination was normal and that she will need to return here for any worsening in her condition.   Final Clinical Impressions(s) / ED Diagnoses   Final diagnoses:  None    New Prescriptions New Prescriptions   No medications on file     Charlestine NightChristopher Aleyza Salmi, PA-C 09/24/16 0147    Loren Raceravid Yelverton, MD 09/28/16 [phone]

## 2016-09-24 ENCOUNTER — Ambulatory Visit (INDEPENDENT_AMBULATORY_CARE_PROVIDER_SITE_OTHER): Payer: BLUE CROSS/BLUE SHIELD | Admitting: Internal Medicine

## 2016-09-24 ENCOUNTER — Encounter: Payer: Self-pay | Admitting: Internal Medicine

## 2016-09-24 VITALS — BP 140/92 | HR 109 | Temp 98.5°F | Resp 16 | Ht 65.0 in | Wt 246.2 lb

## 2016-09-24 DIAGNOSIS — F439 Reaction to severe stress, unspecified: Secondary | ICD-10-CM | POA: Diagnosis not present

## 2016-09-24 DIAGNOSIS — G4733 Obstructive sleep apnea (adult) (pediatric): Secondary | ICD-10-CM | POA: Diagnosis not present

## 2016-09-24 DIAGNOSIS — R000 Tachycardia, unspecified: Secondary | ICD-10-CM

## 2016-09-24 DIAGNOSIS — M85851 Other specified disorders of bone density and structure, right thigh: Secondary | ICD-10-CM | POA: Diagnosis not present

## 2016-09-24 DIAGNOSIS — M85852 Other specified disorders of bone density and structure, left thigh: Secondary | ICD-10-CM

## 2016-09-24 DIAGNOSIS — R590 Localized enlarged lymph nodes: Secondary | ICD-10-CM

## 2016-09-24 DIAGNOSIS — F988 Other specified behavioral and emotional disorders with onset usually occurring in childhood and adolescence: Secondary | ICD-10-CM

## 2016-09-24 DIAGNOSIS — K2100 Gastro-esophageal reflux disease with esophagitis, without bleeding: Secondary | ICD-10-CM

## 2016-09-24 DIAGNOSIS — E559 Vitamin D deficiency, unspecified: Secondary | ICD-10-CM

## 2016-09-24 DIAGNOSIS — M8580 Other specified disorders of bone density and structure, unspecified site: Secondary | ICD-10-CM | POA: Insufficient documentation

## 2016-09-24 DIAGNOSIS — E034 Atrophy of thyroid (acquired): Secondary | ICD-10-CM

## 2016-09-24 MED ORDER — PANTOPRAZOLE SODIUM 40 MG PO TBEC: 40.0000 mg | DELAYED_RELEASE_TABLET | Freq: Two times a day (BID) | ORAL | 11 refills | Status: DC

## 2016-09-24 MED ORDER — CALCITRIOL 0.25 MCG PO CAPS: 0.2500 ug | ORAL_CAPSULE | Freq: Every day | ORAL | 11 refills | Status: DC

## 2016-09-24 MED ORDER — MODAFINIL 100 MG PO TABS: 100.0000 mg | ORAL_TABLET | Freq: Every day | ORAL | 2 refills | Status: DC

## 2016-09-24 MED ORDER — ALPRAZOLAM 0.5 MG PO TABS: 0.5000 mg | ORAL_TABLET | Freq: Two times a day (BID) | ORAL | 0 refills | Status: DC | PRN

## 2016-09-24 NOTE — Assessment & Plan Note (Signed)
Vit D s/p endocr ref

## 2016-09-24 NOTE — Assessment & Plan Note (Signed)
CT neck 

## 2016-09-24 NOTE — Assessment & Plan Note (Signed)
Levothroid 

## 2016-09-24 NOTE — Assessment & Plan Note (Signed)
D/c addrrall

## 2016-09-24 NOTE — Assessment & Plan Note (Signed)
D/c adderall

## 2016-09-24 NOTE — Assessment & Plan Note (Signed)
Xanax short term

## 2016-09-24 NOTE — Assessment & Plan Note (Signed)
Protonix bid Zantac bid Loose wt, stop smoking etc

## 2016-09-24 NOTE — Progress Notes (Signed)
Subjective:  Patient ID: Tiffany Medina, female    DOB: Nov 18, 1965  Age: 51 y.o. MRN: [id number]  CC: Hospitalization Follow-up (HTN, GERD, Stress) and Edema (neck swelling )   HPI Tiffany Medina presents for HTN, GERD, anxiety - worse C/o R neck swelling - a gland on the R x 2-3 mo The pt went to ER yesterday for high BP and stress: BP 150/110 at work w/blurred vision  Outpatient Medications Prior to Visit  Medication Sig Dispense Refill  . Albuterol Sulfate (PROAIR RESPICLICK) 108 (90 BASE) MCG/ACT AEPB Inhale 1-2 puffs into the lungs 4 (four) times daily as needed. 1 each 11  . ALPRAZolam (XANAX) 0.5 MG tablet Take 1-2 tablets (0.5-1 mg total) by mouth 2 (two) times daily as needed for anxiety. 20 tablet 0  . amphetamine-dextroamphetamine (ADDERALL) 10 MG tablet TAKE 1 TABLET TWICE DAILY 60 tablet 0  . bacitracin-polymyxin b (POLYSPORIN) ophthalmic ointment Place 1 application into the right eye 3 (three) times daily. apply to eye every 12 hours while awake 3.5 g 0  . Black Cohosh 540 MG CAPS 1 po qhs 90 capsule 3  . COCONUT OIL PO Take 2 capsules by mouth 2 (two) times daily.    . Cyanocobalamin 1000 MCG SUBL Place 1 tablet (1,000 mcg total) under the tongue daily. 100 tablet 3  . diphenhydrAMINE (BENADRYL) 25 MG tablet Take 1 tablet (25 mg total) by mouth every 6 (six) hours as needed for allergies. 100 tablet 3  . gabapentin (NEURONTIN) 300 MG capsule Take 1-2 capsules (300-600 mg total) by mouth at bedtime. Nerve pain (Patient taking differently: Take 600 mg by mouth at bedtime. Nerve pain) 180 capsule 1  . GOODSENSE ASPIRIN 325 MG tablet TAKE 1 TABLET BY MOUTH TWICE DAILY 100 tablet 2  . levothyroxine (SYNTHROID, LEVOTHROID) 100 MCG tablet Take 1 tablet (100 mcg total) by mouth daily before breakfast. 100 tablet 3  . loratadine (CLARITIN) 10 MG tablet Take 1 tablet (10 mg total) by mouth daily. 100 tablet 3  . pantoprazole (PROTONIX) 40 MG tablet TAKE 1 TABLET EVERY DAY 30 tablet 5   . ranitidine (ZANTAC) 150 MG tablet Take 150 mg by mouth daily.    . traMADol (ULTRAM) 50 MG tablet Take 1 tablet (50 mg total) by mouth 2 (two) times daily. (Patient taking differently: Take 100 mg by mouth at bedtime. ) 60 tablet 3  . triazolam (HALCION) 0.25 MG tablet TAKE 1 TABLET BY MOUTH NIGHTLY AT BEDTIME AS NEEDED FOR SLEEP. (Patient taking differently: Take 0.25 mg by mouth at bedtime. ) 30 tablet 3   No facility-administered medications prior to visit.     ROS Review of Systems  Constitutional: Positive for fatigue. Negative for activity change, appetite change, chills, diaphoresis, fever and unexpected weight change.  HENT: Negative for congestion, ear pain, facial swelling, hearing loss, mouth sores, nosebleeds, postnasal drip, rhinorrhea, sinus pressure, sneezing, sore throat, tinnitus and trouble swallowing.   Eyes: Negative for pain, discharge, redness, itching and visual disturbance.  Respiratory: Negative for cough, chest tightness, shortness of breath, wheezing and stridor.   Cardiovascular: Positive for palpitations. Negative for chest pain and leg swelling.  Gastrointestinal: Negative for abdominal distention, anal bleeding, blood in stool, constipation, diarrhea, nausea and rectal pain.  Genitourinary: Positive for frequency. Negative for difficulty urinating, dysuria, flank pain, genital sores, hematuria, pelvic pain, urgency, vaginal bleeding and vaginal discharge.  Musculoskeletal: Positive for arthralgias. Negative for back pain, gait problem, joint swelling, neck pain and neck stiffness.  Skin: Positive for rash.  Neurological: Negative for dizziness, tremors, seizures, syncope, speech difficulty, weakness, numbness and headaches.  Hematological: Negative for adenopathy. Does not bruise/bleed easily.  Psychiatric/Behavioral: Negative for behavioral problems, decreased concentration, dysphoric mood, sleep disturbance and suicidal ideas. The patient is not  nervous/anxious.     Objective:  BP (!) 140/92   Pulse (!) 109   Temp 98.5 F (36.9 C) (Oral)   Resp 16   Ht 5\' 5"  (1.651 m)   Wt 246 lb 4 oz (111.7 kg)   SpO2 95%   BMI 40.98 kg/m   BP Readings from Last 3 Encounters:  09/24/16 (!) 140/92  09/23/16 132/75  08/25/16 128/86    Wt Readings from Last 3 Encounters:  09/24/16 246 lb 4 oz (111.7 kg)  08/25/16 248 lb (112.5 kg)  07/07/16 242 lb (109.8 kg)    Physical Exam  Constitutional: She appears well-developed. No distress.  HENT:  Head: Normocephalic.  Right Ear: External ear normal.  Left Ear: External ear normal.  Nose: Nose normal.  Mouth/Throat: Oropharynx is clear and moist.  Eyes: Conjunctivae are normal. Pupils are equal, round, and reactive to light. Right eye exhibits no discharge. Left eye exhibits no discharge.  Neck: Normal range of motion. Neck supple. No JVD present. No tracheal deviation present. No thyromegaly present.  Cardiovascular: Normal rate, regular rhythm and normal heart sounds.   Pulmonary/Chest: No stridor. No respiratory distress. She has no wheezes.  Abdominal: Soft. Bowel sounds are normal. She exhibits no distension and no mass. There is no tenderness. There is no rebound and no guarding.  Musculoskeletal: She exhibits no edema or tenderness.  Lymphadenopathy:    She has no cervical adenopathy.  Neurological: She displays normal reflexes. No cranial nerve deficit. She exhibits normal muscle tone. Coordination normal.  Skin: No rash noted. No erythema.  Psychiatric: She has a normal mood and affect. Her behavior is normal. Judgment and thought content normal.  Obese Flushed face R neck adenopathy, sensitive  Lab Results  Component Value Date   WBC 6.6 09/23/2016   HGB 14.1 09/23/2016   HCT 42.1 09/23/2016   PLT 342 09/23/2016   GLUCOSE 99 09/22/2016   CHOL 168 03/26/2016   TRIG 115.0 03/26/2016   HDL 49.30 03/26/2016   LDLCALC 95 03/26/2016   ALT 18 03/26/2016   AST 16  03/26/2016   NA 139 09/22/2016   K 4.0 09/22/2016   CL 102 09/22/2016   CREATININE 0.76 09/22/2016   BUN 11 09/22/2016   CO2 28 09/22/2016   TSH 0.58 06/18/2016   INR 1.02 11/28/2014    Ct Head Wo Contrast  Result Date: 09/22/2016 CLINICAL DATA:  Blurry vision, increased blood pressure starting this morning EXAM: CT HEAD WITHOUT CONTRAST TECHNIQUE: Contiguous axial images were obtained from the base of the skull through the vertex without intravenous contrast. COMPARISON:  None. FINDINGS: Brain: No intracranial hemorrhage, mass effect or midline shift. No acute cortical infarction. No mass lesion is noted on this unenhanced scan. No hydrocephalus. The gray and white-matter differentiation is preserved. Vascular: No hyperdense vessel or unexpected calcification. Skull: Normal. Negative for fracture or focal lesion. Sinuses/Orbits: No acute finding. Other: None. IMPRESSION: No acute intracranial abnormality. No definite acute cortical infarction. Electronically Signed   By: Natasha MeadLiviu  Pop M.D.   On: 09/22/2016 18:42    Assessment & Plan:   Tiffany BradfordKimberly was seen today for hospitalization follow-up and edema.  Diagnoses and all orders for this visit:  Osteopenia of both thighs  Vitamin D deficiency  Other orders -     calcitRIOL (ROCALTROL) 0.25 MCG capsule; Take 1 capsule (0.25 mcg total) by mouth daily.   I am having Tiffany Medina start on calcitRIOL. I am also having her maintain her ranitidine, Albuterol Sulfate, levothyroxine, Cyanocobalamin, diphenhydrAMINE, loratadine, gabapentin, Black Cohosh, amphetamine-dextroamphetamine, ALPRAZolam, GOODSENSE ASPIRIN, pantoprazole, traMADol, triazolam, COCONUT OIL PO, and bacitracin-polymyxin b.  Meds ordered this encounter  Medications  . calcitRIOL (ROCALTROL) 0.25 MCG capsule    Sig: Take 1 capsule (0.25 mcg total) by mouth daily.    Dispense:  30 capsule    Refill:  11     Follow-up: No Follow-up on file.  Sonda Primes, MD

## 2016-09-24 NOTE — Assessment & Plan Note (Addendum)
Rocaltrol to try - stop if rash immediatly

## 2016-09-24 NOTE — Assessment & Plan Note (Signed)
Start Provigil

## 2016-09-24 NOTE — Progress Notes (Signed)
Pre-visit discussion using our clinic review tool. No additional management support is needed unless otherwise documented below in the visit note.  

## 2016-09-29 ENCOUNTER — Telehealth: Payer: Self-pay

## 2016-09-29 NOTE — Telephone Encounter (Signed)
PA for Modafinil 100 mg tablet initiated through [email or web address] Key: [id number]

## 2016-09-30 ENCOUNTER — Inpatient Hospital Stay: Admission: RE | Admit: 2016-09-30 | Payer: BLUE CROSS/BLUE SHIELD | Source: Ambulatory Visit

## 2016-09-30 ENCOUNTER — Other Ambulatory Visit: Payer: Self-pay | Admitting: Internal Medicine

## 2016-10-08 ENCOUNTER — Ambulatory Visit (INDEPENDENT_AMBULATORY_CARE_PROVIDER_SITE_OTHER)
Admission: RE | Admit: 2016-10-08 | Discharge: 2016-10-08 | Disposition: A | Discharge status: Home / Self Care | Payer: BLUE CROSS/BLUE SHIELD | Source: Ambulatory Visit | Attending: Internal Medicine | Admitting: Internal Medicine

## 2016-10-08 DIAGNOSIS — R221 Localized swelling, mass and lump, neck: Secondary | ICD-10-CM | POA: Diagnosis not present

## 2016-10-08 DIAGNOSIS — R590 Localized enlarged lymph nodes: Secondary | ICD-10-CM

## 2016-10-08 MED ORDER — IOPAMIDOL (ISOVUE-300) INJECTION 61%
75.0000 mL | Freq: Once | INTRAVENOUS | Status: AC | PRN
  Administered 2016-10-08: 75 mL via INTRAVENOUS

## 2016-10-22 NOTE — Telephone Encounter (Signed)
Medication Approved

## 2016-12-02 ENCOUNTER — Emergency Department (HOSPITAL_BASED_OUTPATIENT_CLINIC_OR_DEPARTMENT_OTHER)
Admission: EM | Admit: 2016-12-02 | Discharge: 2016-12-02 | Disposition: A | Discharge status: Home / Self Care | Payer: BLUE CROSS/BLUE SHIELD | Attending: Emergency Medicine | Admitting: Emergency Medicine

## 2016-12-02 ENCOUNTER — Emergency Department (HOSPITAL_BASED_OUTPATIENT_CLINIC_OR_DEPARTMENT_OTHER): Payer: BLUE CROSS/BLUE SHIELD

## 2016-12-02 ENCOUNTER — Encounter (HOSPITAL_BASED_OUTPATIENT_CLINIC_OR_DEPARTMENT_OTHER): Payer: Self-pay | Admitting: *Deleted

## 2016-12-02 DIAGNOSIS — J45909 Unspecified asthma, uncomplicated: Secondary | ICD-10-CM | POA: Diagnosis not present

## 2016-12-02 DIAGNOSIS — R2243 Localized swelling, mass and lump, lower limb, bilateral: Secondary | ICD-10-CM | POA: Insufficient documentation

## 2016-12-02 DIAGNOSIS — E039 Hypothyroidism, unspecified: Secondary | ICD-10-CM | POA: Diagnosis not present

## 2016-12-02 DIAGNOSIS — Z7982 Long term (current) use of aspirin: Secondary | ICD-10-CM | POA: Insufficient documentation

## 2016-12-02 DIAGNOSIS — Z79899 Other long term (current) drug therapy: Secondary | ICD-10-CM | POA: Diagnosis not present

## 2016-12-02 DIAGNOSIS — M7989 Other specified soft tissue disorders: Secondary | ICD-10-CM | POA: Diagnosis not present

## 2016-12-02 DIAGNOSIS — F17210 Nicotine dependence, cigarettes, uncomplicated: Secondary | ICD-10-CM | POA: Diagnosis not present

## 2016-12-02 LAB — BASIC METABOLIC PANEL
Anion gap: 9 (ref 5–15)
BUN: 14 mg/dL (ref 6–20)
CALCIUM: 9.2 mg/dL (ref 8.9–10.3)
CO2: 27 mmol/L (ref 22–32)
Chloride: 105 mmol/L (ref 101–111)
Creatinine, Ser: 0.81 mg/dL (ref 0.44–1.00)
GFR calc Af Amer: >60 mL/min (ref >60)
GLUCOSE: 112 mg/dL — AB (ref 65–99)
Potassium: 4.1 mmol/L (ref 3.5–5.1)
SODIUM: 141 mmol/L (ref 135–145)

## 2016-12-02 LAB — CBC WITH DIFFERENTIAL/PLATELET
BASOS ABS: 0 10*3/uL (ref 0.0–0.1)
BASOS PCT: 0 %
EOS PCT: 3 %
Eosinophils Absolute: 0.2 10*3/uL (ref 0.0–0.7)
HEMATOCRIT: 40 % (ref 36.0–46.0)
Hemoglobin: 13.3 g/dL (ref 12.0–15.0)
LYMPHS PCT: 30 %
Lymphs Abs: 1.8 10*3/uL (ref 0.7–4.0)
MCH: 31.7 pg (ref 26.0–34.0)
MCHC: 33.3 g/dL (ref 30.0–36.0)
MCV: 95.2 fL (ref 78.0–100.0)
MONO ABS: 0.5 10*3/uL (ref 0.1–1.0)
Monocytes Relative: 9 %
NEUTROS ABS: 3.5 10*3/uL (ref 1.7–7.7)
Neutrophils Relative %: 58 %
PLATELETS: 316 10*3/uL (ref 150–400)
RBC: 4.2 MIL/uL (ref 3.87–5.11)
RDW: 13.5 % (ref 11.5–15.5)
WBC: 6 10*3/uL (ref 4.0–10.5)

## 2016-12-02 LAB — BRAIN NATRIURETIC PEPTIDE: B Natriuretic Peptide: 15.5 pg/mL (ref 0.0–100.0)

## 2016-12-02 MED ORDER — HYDROCODONE-ACETAMINOPHEN 5-325 MG PO TABS
1.0000 | ORAL_TABLET | Freq: Once | ORAL | Status: AC
  Administered 2016-12-02: 1 via ORAL
  Filled 2016-12-02: qty 1

## 2016-12-02 NOTE — ED Provider Notes (Signed)
[facility] Provider Note   CSN: [id number] Arrival date & time: 12/02/16  1535     History   Chief Complaint Chief Complaint  Patient presents with  . Leg Swelling    HPI Tiffany Medina is a 51 y.o. female.  HPI   Patient is a 51 year old female with history of DVT, lupus, OSA, GERD, anxiety and depression who presents the ED with complaint of leg swelling. Patient reports having history of chronic intermittent lower leg swelling. 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 Patient reports having swelling last night when she got home from work but notes when she was resting and elevating her legs the swelling did not improve which is unusual for her. She states when she woke up this morning she continued to have swelling and noted that her left leg appeared more swollen than the right. She also reports having constant dull pain that radiates from her right hip down the backside of her right leg with intermittent sharp shooting pain which she notes is worse with movement. Patient reports having history of left leg DVT in 2000 with similar symptoms. Denies current use of anticoagulants. She notes she has been taking her home prescription of tramadol with mild intermittent relief. Endorses associated nausea and reports having low back pain and LE numbess which are chronic and unchanged. Denies fever, chills, headache, chest pain, shortness of breath, abdominal pain, vomiting, saddle anesthesia, urinary or bowel incontinence, weakness, color change. Patient denies any recent long car rides or airplane travel. Denies hx of CA, use of exogenous hormones, recent hospitalization or surgery.  Past Medical History:  Diagnosis Date  . Anxiety   . Asthma   . Depression   . Discoid lupus   . DVT (deep venous thrombosis) (HCC)   . Family history of breast cancer   . Family history of colon cancer   . GERD (gastroesophageal reflux disease)     . Lupus   . Sleep apnea     Patient Active Problem List   Diagnosis Date Noted  . Osteopenia 09/24/2016  . Artificial menopause state 08/25/2016  . Asymptomatic menopausal state 08/25/2016  . Stress at home 07/07/2016  . Insomnia 01/02/2016  . GERD (gastroesophageal reflux disease) 09/01/2015  . Acute bronchitis 06/27/2015  . Wheezing 06/27/2015  . Genetic testing 06/20/2015  . Vitamin D deficiency 06/09/2015  . Family history of colon cancer   . Family history of breast cancer   . Cervical adenopathy 04/14/2015  . Tachycardia 04/14/2015  . Rash and nonspecific skin eruption 01/22/2015  . ADD (attention deficit disorder) 01/22/2015  . Restless leg syndrome 12/09/2014  . CFS (chronic fatigue syndrome) 12/09/2014  . Obesity, morbid (HCC) 12/09/2014  . OSA (obstructive sleep apnea) 12/09/2014  . Hypothyroidism 12/09/2014  . Paresthesia 12/09/2014  . Heavy tobacco smoker >10 cigarettes per day 12/09/2014  . History of DVT of lower extremity 12/09/2014  . Discoid lupus 12/09/2014    Past Surgical History:  Procedure Laterality Date  . ABDOMINAL HYSTERECTOMY    . APPENDECTOMY    . TUBAL LIGATION  1987  . VEIN LIGATION AND STRIPPING      OB History    No data available       Home Medications    Prior to Admission medications   Medication Sig Start Date End Date Taking? Authorizing Provider  Albuterol Sulfate (PROAIR RESPICLICK) 108 (90 BASE) MCG/ACT AEPB Inhale 1-2 puffs  into the lungs 4 (four) times daily as needed. 06/27/15   Plotnikov, Georgina Quint, MD  ALPRAZolam Prudy Feeler) 0.5 MG tablet Take 1-2 tablets (0.5-1 mg total) by mouth 2 (two) times daily as needed for anxiety. 09/24/16 09/24/17  Plotnikov, Georgina Quint, MD  bacitracin-polymyxin b (POLYSPORIN) ophthalmic ointment Place 1 application into the right eye 3 (three) times daily. apply to eye every 12 hours while awake 09/23/16   Horton, Mayer Masker, MD  Black Cohosh 540 MG CAPS 1 po qhs 03/26/16   Plotnikov, Georgina Quint, MD   calcitRIOL (ROCALTROL) 0.25 MCG capsule Take 1 capsule (0.25 mcg total) by mouth daily. 09/24/16 09/24/17  Plotnikov, Georgina Quint, MD  COCONUT OIL PO Take 2 capsules by mouth 2 (two) times daily.    [provider]  Cyanocobalamin 1000 MCG SUBL Place 1 tablet (1,000 mcg total) under the tongue daily. 01/02/16   Plotnikov, Georgina Quint, MD  diphenhydrAMINE (BENADRYL) 25 MG tablet Take 1 tablet (25 mg total) by mouth every 6 (six) hours as needed for allergies. 01/02/16   Plotnikov, Georgina Quint, MD  gabapentin (NEURONTIN) 300 MG capsule TAKE 1 TO 2 CAPSULES AT BEDTIME FOR NERVE PAIN 10/04/16   Plotnikov, Georgina Quint, MD  GOODSENSE ASPIRIN 325 MG tablet TAKE 1 TABLET BY MOUTH TWICE DAILY 09/03/16   Plotnikov, Georgina Quint, MD  levothyroxine (SYNTHROID, LEVOTHROID) 100 MCG tablet Take 1 tablet (100 mcg total) by mouth daily before breakfast. 01/02/16   Plotnikov, Georgina Quint, MD  loratadine (CLARITIN) 10 MG tablet Take 1 tablet (10 mg total) by mouth daily. 01/02/16   Plotnikov, Georgina Quint, MD  modafinil (PROVIGIL) 100 MG tablet Take 1 tablet (100 mg total) by mouth daily. 09/24/16   Plotnikov, Georgina Quint, MD  pantoprazole (PROTONIX) 40 MG tablet Take 1 tablet (40 mg total) by mouth 2 (two) times daily. 09/24/16   Plotnikov, Georgina Quint, MD  ranitidine (ZANTAC) 150 MG tablet Take 150 mg by mouth daily.    [provider]  traMADol (ULTRAM) 50 MG tablet Take 1 tablet (50 mg total) by mouth 2 (two) times daily. Patient taking differently: Take 100 mg by mouth at bedtime.  09/17/16   Plotnikov, Georgina Quint, MD  triazolam (HALCION) 0.25 MG tablet TAKE 1 TABLET BY MOUTH NIGHTLY AT BEDTIME AS NEEDED FOR SLEEP. Patient taking differently: Take 0.25 mg by mouth at bedtime.  09/17/16   Plotnikov, Georgina Quint, MD    Family History Family History  Problem Relation Age of Onset  . Breast cancer Mother 56       bilateral breast cancer  . Hodgkin's lymphoma Father        dx in his 71s  . Lung cancer Father 7  . Lung cancer  Sister 55       adenocarcinoma (also a smoker)  . Lupus Maternal Uncle   . Colon cancer Maternal Uncle        dx in his 30s  . Cancer Paternal Uncle        2 paternal uncles with cancer NOS  . Diabetes Maternal Grandmother   . Colon cancer Maternal Grandfather        late 74s  . Diabetes Paternal Grandmother   . Colon cancer Paternal Grandfather 57  . Breast cancer Sister 54  . Cancer Other   . Diabetes Other   . Mental illness Other     Social History Social History  Substance Use Topics  . Smoking status: Current Every Day Smoker    Packs/day:  1.00    Types: Cigarettes  . Smokeless tobacco: Never Used  . Alcohol use No     Allergies   Vitamin d analogs   Review of Systems Review of Systems  Cardiovascular: Positive for leg swelling.  Gastrointestinal: Positive for nausea.  Musculoskeletal: Positive for back pain (chronic).  Neurological: Positive for numbness (chronic).  All other systems reviewed and are negative.    Physical Exam Updated Vital Signs BP 135/76 (BP Location: Right Arm)   Pulse 96   Temp 98.2 F (36.8 C) (Oral)   Resp 20   SpO2 94%   Physical Exam  Constitutional: She is oriented to person, place, and time. She appears well-developed and well-nourished.  HENT:  Head: Normocephalic and atraumatic.  Eyes: Conjunctivae and EOM are normal. Right eye exhibits no discharge. Left eye exhibits no discharge. No scleral icterus.  Neck: Normal range of motion. Neck supple.  Cardiovascular: Regular rhythm, normal heart sounds and intact distal pulses.   Mildly tachycardic, HR 104  Pulmonary/Chest: Effort normal and breath sounds normal. No respiratory distress. She has no wheezes. She has no rales. She exhibits no tenderness.  Abdominal: Soft. Bowel sounds are normal. She exhibits no distension and no mass. There is no tenderness. There is no rebound and no guarding.  Musculoskeletal: Normal range of motion. She exhibits edema (1+ pitting edema  present to BLE, LVR) and tenderness. She exhibits no deformity.  No midline C, T, or L tenderness. Mild TTP over left lumbar paraspinal muscles. Full range of motion of neck and back. Full range of motion of bilateral upper and lower extremities, with 5/5 strength. Negative straight lef raise bilaterally. Sensation intact. 2+ radial and DP pulses. Cap refill <2 seconds.   Neurological: She is alert and oriented to person, place, and time.  Skin: Skin is warm and dry.  Multiple varicose veins present to BLE.   Nursing note and vitals reviewed.    ED Treatments / Results  Labs (all labs ordered are listed, but only abnormal results are displayed) Labs Reviewed  BASIC METABOLIC PANEL - Abnormal; Notable for the following:       Result Value   Glucose, Bld 112 (*)    All other components within normal limits  CBC WITH DIFFERENTIAL/PLATELET  BRAIN NATRIURETIC PEPTIDE    EKG  EKG Interpretation None       Radiology US Venous Img Lower Unilateral Left  Result Date: 12/02/2016 CLINICAL DATA:  Initial evaluation for acute left leg swelling. EXAM: Left LOWER EXTREMITY VENOUS DOPPLER ULTRASOUND TECHNIQUE: Gray-scale sonography with graded compression, as well as color Doppler and duplex ultrasound were performed to evaluate the lower extremity deep venous systems from the level of the common femoral vein and including the common femoral, femoral, profunda femoral, popliteal and calf veins including the posterior tibial, peroneal and gastrocnemius veins when visible. The superficial great saphenous vein was also interrogated. Spectral Doppler was utilized to evaluate flow at rest and with distal augmentation maneuvers in the common femoral, femoral and popliteal veins. COMPARISON:  None. FINDINGS: Contralateral Common Femoral Vein: Respiratory phasicity is normal and symmetric with the symptomatic side. No evidence of thrombus. Normal compressibility. Common Femoral Vein: No evidence of thrombus.  Normal compressibility, respiratory phasicity and response to augmentation. Saphenofemoral Junction: No evidence of thrombus. Normal compressibility and flow on color Doppler imaging. Profunda Femoral Vein: No evidence of thrombus. Normal compressibility and flow on color Doppler imaging. Femoral Vein: No evidence of thrombus. Normal compressibility, respiratory phasicity and response to  augmentation. Popliteal Vein: No evidence of thrombus. Normal compressibility, respiratory phasicity and response to augmentation. Calf Veins: No evidence of thrombus. Normal compressibility and flow on color Doppler imaging. Superficial Great Saphenous Vein: No evidence of thrombus. Normal compressibility and flow on color Doppler imaging. Venous Reflux:  None. Other Findings:  None. IMPRESSION: No evidence of DVT within the left lower extremity. Electronically Signed   By: Rise MuBenjamin  McClintock M.D.   On: 12/02/2016 17:32    Procedures Procedures (including critical care time)  Medications Ordered in ED Medications  HYDROcodone-acetaminophen (NORCO/VICODIN) 5-325 MG per tablet 1 tablet (1 tablet Oral Given 12/02/16 1629)     Initial Impression / Assessment and Plan / ED Course  I have reviewed the triage vital signs and the nursing notes.  Pertinent labs & imaging results that were available during my care of the patient were reviewed by me and considered in my medical decision making (see chart for details).     Patient presents with worsening swelling to bilateral legs, left worse than right. History of DVT to left leg and 2000. Denies current use of anticoagulants. Denies chest pain or shortness of breath. Initial vitals revealed heart rate 104, remaining vital stable. Exam revealed 1+ pitting edema to bilateral lower extremities, left slightly worse than right. Varicose veins present to bilateral legs. Bilateral lower extremities are vascularly intact. Patient given pain meds in the ED. Doppler ultrasound  negative for DVT of left lower extremity. Basic labs including CBC, BMP and BNP unremarkable.  Shared decision making with pt regarding CT angio for further evaluation of leg swelling with initial tachycardia present during ED visit. Pt continues to deny CP or SOB and on reevaluation HR 96. Shared decision made to hold off on ordering CT as PE seems very unlikely at this time. Discussed results and plan for discharge with patient. Plan have patient follow up with PCP within the next few days for follow-up evaluation and further management. Discussed strict return precautions.  Final Clinical Impressions(s) / ED Diagnoses   Final diagnoses:  Leg swelling    New Prescriptions New Prescriptions   No medications on file     Gilford Rileadeau, Iyauna Sing Elizabeth, PA-C 12/02/16 [id number]    Tegeler, Canary Brimhristopher J, MD 12/03/16 [phone]

## 2016-12-02 NOTE — Discharge Instructions (Signed)
Continue taking her medications as prescribed. I recommend continuing to rest and elevate your legs at home to help with your swelling. Call your primary care provider's office tomorrow morning to schedule a follow-up appointment for reevaluation and further management. Please return to the Emergency Department if symptoms worsen or new onset of fever, chest pain, shortness of breath, worsening swelling, worsening pain, numbness, weakness.

## 2016-12-02 NOTE — ED Notes (Signed)
Patient transported to Ultrasound 

## 2016-12-02 NOTE — ED Triage Notes (Signed)
Swelling to her both of her legs. Swelling is not unusual for her. It normally goes down overnight but didn't last night. She stands on her feet 8 hours at work.

## 2016-12-03 ENCOUNTER — Ambulatory Visit (INDEPENDENT_AMBULATORY_CARE_PROVIDER_SITE_OTHER): Payer: BLUE CROSS/BLUE SHIELD | Admitting: Internal Medicine

## 2016-12-03 ENCOUNTER — Encounter: Payer: Self-pay | Admitting: Internal Medicine

## 2016-12-03 VITALS — BP 138/90 | HR 100 | Temp 97.9°F | Ht 65.0 in | Wt 259.0 lb

## 2016-12-03 DIAGNOSIS — I872 Venous insufficiency (chronic) (peripheral): Secondary | ICD-10-CM | POA: Diagnosis not present

## 2016-12-03 DIAGNOSIS — G4733 Obstructive sleep apnea (adult) (pediatric): Secondary | ICD-10-CM

## 2016-12-03 DIAGNOSIS — R609 Edema, unspecified: Secondary | ICD-10-CM | POA: Insufficient documentation

## 2016-12-03 DIAGNOSIS — R000 Tachycardia, unspecified: Secondary | ICD-10-CM

## 2016-12-03 DIAGNOSIS — F988 Other specified behavioral and emotional disorders with onset usually occurring in childhood and adolescence: Secondary | ICD-10-CM

## 2016-12-03 DIAGNOSIS — R600 Localized edema: Secondary | ICD-10-CM | POA: Diagnosis not present

## 2016-12-03 MED ORDER — MODAFINIL 200 MG PO TABS: 200.0000 mg | ORAL_TABLET | Freq: Every day | ORAL | 1 refills | Status: DC

## 2016-12-03 NOTE — Assessment & Plan Note (Signed)
  Compr socks Wt loss

## 2016-12-03 NOTE — Assessment & Plan Note (Signed)
Monitor HR 

## 2016-12-03 NOTE — Progress Notes (Signed)
Subjective:  Patient ID: Tiffany Medina, female    DOB: 1966-02-13  Age: 51 y.o. MRN: [id number]  CC: Follow-up (Hopsital visit edema bilatral legs, L sided pain)   HPI Tiffany MagnusonKimberly Medina presents for LE edema - worse after work; better off work: went to ER last night after work - Doppler (-), labs ok (BNP, creat). Pt had LBP - got Norco x 1... C/o being sleepy - worse C/o LBP, wt gain    Outpatient Medications Prior to Visit  Medication Sig Dispense Refill  . Albuterol Sulfate (PROAIR RESPICLICK) 108 (90 BASE) MCG/ACT AEPB Inhale 1-2 puffs into the lungs 4 (four) times daily as needed. 1 each 11  . ALPRAZolam (XANAX) 0.5 MG tablet Take 1-2 tablets (0.5-1 mg total) by mouth 2 (two) times daily as needed for anxiety. 20 tablet 0  . Black Cohosh 540 MG CAPS 1 po qhs 90 capsule 3  . calcitRIOL (ROCALTROL) 0.25 MCG capsule Take 1 capsule (0.25 mcg total) by mouth daily. 30 capsule 11  . Cyanocobalamin 1000 MCG SUBL Place 1 tablet (1,000 mcg total) under the tongue daily. 100 tablet 3  . diphenhydrAMINE (BENADRYL) 25 MG tablet Take 1 tablet (25 mg total) by mouth every 6 (six) hours as needed for allergies. 100 tablet 3  . gabapentin (NEURONTIN) 300 MG capsule TAKE 1 TO 2 CAPSULES AT BEDTIME FOR NERVE PAIN 180 capsule 2  . GOODSENSE ASPIRIN 325 MG tablet TAKE 1 TABLET BY MOUTH TWICE DAILY 100 tablet 2  . levothyroxine (SYNTHROID, LEVOTHROID) 100 MCG tablet Take 1 tablet (100 mcg total) by mouth daily before breakfast. 100 tablet 3  . loratadine (CLARITIN) 10 MG tablet Take 1 tablet (10 mg total) by mouth daily. 100 tablet 3  . modafinil (PROVIGIL) 100 MG tablet Take 1 tablet (100 mg total) by mouth daily. 30 tablet 2  . pantoprazole (PROTONIX) 40 MG tablet Take 1 tablet (40 mg total) by mouth 2 (two) times daily. 60 tablet 11  . ranitidine (ZANTAC) 150 MG tablet Take 150 mg by mouth daily.    . traMADol (ULTRAM) 50 MG tablet Take 1 tablet (50 mg total) by mouth 2 (two) times daily. (Patient  taking differently: Take 100 mg by mouth at bedtime. ) 60 tablet 3  . triazolam (HALCION) 0.25 MG tablet TAKE 1 TABLET BY MOUTH NIGHTLY AT BEDTIME AS NEEDED FOR SLEEP. (Patient taking differently: Take 0.25 mg by mouth at bedtime. ) 30 tablet 3  . bacitracin-polymyxin b (POLYSPORIN) ophthalmic ointment Place 1 application into the right eye 3 (three) times daily. apply to eye every 12 hours while awake 3.5 g 0  . COCONUT OIL PO Take 2 capsules by mouth 2 (two) times daily.     No facility-administered medications prior to visit.     ROS Review of Systems  Constitutional: Positive for fatigue and unexpected weight change. Negative for activity change, appetite change and chills.  HENT: Negative for congestion, mouth sores and sinus pressure.   Eyes: Negative for visual disturbance.  Respiratory: Negative for cough and chest tightness.   Cardiovascular: Positive for palpitations and leg swelling.  Gastrointestinal: Negative for abdominal pain and nausea.  Genitourinary: Negative for difficulty urinating, frequency and vaginal pain.  Musculoskeletal: Positive for back pain. Negative for gait problem.  Skin: Negative for pallor and rash.  Neurological: Negative for dizziness, tremors, weakness, numbness and headaches.  Psychiatric/Behavioral: Positive for decreased concentration. Negative for confusion and sleep disturbance. The patient is nervous/anxious.     Objective:  BP  138/90 (BP Location: Left Arm, Patient Position: Sitting, Cuff Size: Large)   Pulse 100   Temp 97.9 F (36.6 C) (Oral)   Ht 5\' 5"  (1.651 m)   Wt 259 lb (117.5 kg)   SpO2 97%   BMI 43.10 kg/m   BP Readings from Last 3 Encounters:  12/03/16 138/90  12/02/16 135/76  09/24/16 (!) 140/92    Wt Readings from Last 3 Encounters:  12/03/16 259 lb (117.5 kg)  09/24/16 246 lb 4 oz (111.7 kg)  08/25/16 248 lb (112.5 kg)    Physical Exam  Constitutional: She appears well-developed. No distress.  HENT:  Head:  Normocephalic.  Right Ear: External ear normal.  Left Ear: External ear normal.  Nose: Nose normal.  Mouth/Throat: Oropharynx is clear and moist.  Eyes: Conjunctivae are normal. Pupils are equal, round, and reactive to light. Right eye exhibits no discharge. Left eye exhibits no discharge.  Neck: Normal range of motion. Neck supple. No JVD present. No tracheal deviation present. No thyromegaly present.  Cardiovascular: Normal rate, regular rhythm and normal heart sounds.   Pulmonary/Chest: No stridor. No respiratory distress. She has no wheezes.  Abdominal: Soft. Bowel sounds are normal. She exhibits no distension and no mass. There is no tenderness. There is no rebound and no guarding.  Musculoskeletal: She exhibits edema and tenderness.  Lymphadenopathy:    She has no cervical adenopathy.  Neurological: She displays normal reflexes. No cranial nerve deficit. She exhibits normal muscle tone. Coordination normal.  Skin: No rash noted. No erythema.  Psychiatric: She has a normal mood and affect. Her behavior is normal. Judgment and thought content normal.  No edema now LS tender Obese Tachycardic  Lab Results  Component Value Date   WBC 6.0 12/02/2016   HGB 13.3 12/02/2016   HCT 40.0 12/02/2016   PLT 316 12/02/2016   GLUCOSE 112 (H) 12/02/2016   CHOL 168 03/26/2016   TRIG 115.0 03/26/2016   HDL 49.30 03/26/2016   LDLCALC 95 03/26/2016   ALT 18 03/26/2016   AST 16 03/26/2016   NA 141 12/02/2016   K 4.1 12/02/2016   CL 105 12/02/2016   CREATININE 0.81 12/02/2016   BUN 14 12/02/2016   CO2 27 12/02/2016   TSH 0.58 06/18/2016   INR 1.02 11/28/2014    US Venous Img Lower Unilateral Left  Result Date: 12/02/2016 CLINICAL DATA:  Initial evaluation for acute left leg swelling. EXAM: Left LOWER EXTREMITY VENOUS DOPPLER ULTRASOUND TECHNIQUE: Gray-scale sonography with graded compression, as well as color Doppler and duplex ultrasound were performed to evaluate the lower extremity  deep venous systems from the level of the common femoral vein and including the common femoral, femoral, profunda femoral, popliteal and calf veins including the posterior tibial, peroneal and gastrocnemius veins when visible. The superficial great saphenous vein was also interrogated. Spectral Doppler was utilized to evaluate flow at rest and with distal augmentation maneuvers in the common femoral, femoral and popliteal veins. COMPARISON:  None. FINDINGS: Contralateral Common Femoral Vein: Respiratory phasicity is normal and symmetric with the symptomatic side. No evidence of thrombus. Normal compressibility. Common Femoral Vein: No evidence of thrombus. Normal compressibility, respiratory phasicity and response to augmentation. Saphenofemoral Junction: No evidence of thrombus. Normal compressibility and flow on color Doppler imaging. Profunda Femoral Vein: No evidence of thrombus. Normal compressibility and flow on color Doppler imaging. Femoral Vein: No evidence of thrombus. Normal compressibility, respiratory phasicity and response to augmentation. Popliteal Vein: No evidence of thrombus. Normal compressibility, respiratory phasicity and  response to augmentation. Calf Veins: No evidence of thrombus. Normal compressibility and flow on color Doppler imaging. Superficial Great Saphenous Vein: No evidence of thrombus. Normal compressibility and flow on color Doppler imaging. Venous Reflux:  None. Other Findings:  None. IMPRESSION: No evidence of DVT within the left lower extremity. Electronically Signed   By: Rise Mu M.D.   On: 12/02/2016 17:32    Assessment & Plan:   There are no diagnoses linked to this encounter. I have discontinued Ms. Grega's COCONUT OIL PO and bacitracin-polymyxin b. I am also having her maintain her ranitidine, Albuterol Sulfate, levothyroxine, Cyanocobalamin, diphenhydrAMINE, loratadine, Black Cohosh, GOODSENSE ASPIRIN, traMADol, triazolam, calcitRIOL, pantoprazole,  modafinil, ALPRAZolam, and gabapentin.  No orders of the defined types were placed in this encounter.    Follow-up: No Follow-up on file.  Sonda Primes, MD

## 2016-12-03 NOTE — Assessment & Plan Note (Signed)
Wt Readings from Last 3 Encounters:  12/03/16 259 lb (117.5 kg)  09/24/16 246 lb 4 oz (111.7 kg)  08/25/16 248 lb (112.5 kg)  advised to see Dr Dalbert GarnetBeasley

## 2016-12-03 NOTE — Assessment & Plan Note (Signed)
Provigil

## 2016-12-03 NOTE — Assessment & Plan Note (Signed)
Chronic ven insufficiency Compr socks Wt loss

## 2016-12-14 ENCOUNTER — Other Ambulatory Visit: Payer: Self-pay | Admitting: Internal Medicine

## 2016-12-17 NOTE — Telephone Encounter (Signed)
Called refill into pharmacy spoke w/courtney gave MD authorization...Raechel Chute/lmb

## 2016-12-31 ENCOUNTER — Ambulatory Visit: Payer: BLUE CROSS/BLUE SHIELD | Admitting: Internal Medicine

## 2017-01-18 ENCOUNTER — Other Ambulatory Visit: Payer: Self-pay | Admitting: Internal Medicine

## 2017-01-28 DIAGNOSIS — Z86718 Personal history of other venous thrombosis and embolism: Secondary | ICD-10-CM | POA: Diagnosis not present

## 2017-01-28 DIAGNOSIS — Z79899 Other long term (current) drug therapy: Secondary | ICD-10-CM | POA: Diagnosis not present

## 2017-01-28 DIAGNOSIS — R600 Localized edema: Secondary | ICD-10-CM | POA: Diagnosis not present

## 2017-01-28 DIAGNOSIS — G629 Polyneuropathy, unspecified: Secondary | ICD-10-CM | POA: Diagnosis not present

## 2017-01-28 DIAGNOSIS — Z6841 Body Mass Index (BMI) 40.0 and over, adult: Secondary | ICD-10-CM | POA: Diagnosis not present

## 2017-01-28 DIAGNOSIS — D3502 Benign neoplasm of left adrenal gland: Secondary | ICD-10-CM | POA: Diagnosis not present

## 2017-01-28 DIAGNOSIS — J439 Emphysema, unspecified: Secondary | ICD-10-CM | POA: Diagnosis not present

## 2017-01-28 DIAGNOSIS — Z7982 Long term (current) use of aspirin: Secondary | ICD-10-CM | POA: Diagnosis not present

## 2017-01-28 DIAGNOSIS — I081 Rheumatic disorders of both mitral and tricuspid valves: Secondary | ICD-10-CM | POA: Diagnosis not present

## 2017-01-28 DIAGNOSIS — M7989 Other specified soft tissue disorders: Secondary | ICD-10-CM | POA: Diagnosis not present

## 2017-01-28 DIAGNOSIS — D3500 Benign neoplasm of unspecified adrenal gland: Secondary | ICD-10-CM | POA: Diagnosis not present

## 2017-01-28 DIAGNOSIS — R079 Chest pain, unspecified: Secondary | ICD-10-CM | POA: Diagnosis not present

## 2017-01-28 DIAGNOSIS — K219 Gastro-esophageal reflux disease without esophagitis: Secondary | ICD-10-CM | POA: Diagnosis not present

## 2017-01-28 DIAGNOSIS — M549 Dorsalgia, unspecified: Secondary | ICD-10-CM | POA: Diagnosis not present

## 2017-01-28 DIAGNOSIS — F17210 Nicotine dependence, cigarettes, uncomplicated: Secondary | ICD-10-CM | POA: Diagnosis not present

## 2017-01-28 DIAGNOSIS — R6884 Jaw pain: Secondary | ICD-10-CM | POA: Diagnosis not present

## 2017-01-28 DIAGNOSIS — L930 Discoid lupus erythematosus: Secondary | ICD-10-CM | POA: Diagnosis not present

## 2017-01-28 DIAGNOSIS — F410 Panic disorder [episodic paroxysmal anxiety] without agoraphobia: Secondary | ICD-10-CM | POA: Diagnosis not present

## 2017-01-28 DIAGNOSIS — R509 Fever, unspecified: Secondary | ICD-10-CM | POA: Diagnosis not present

## 2017-01-28 DIAGNOSIS — R0602 Shortness of breath: Secondary | ICD-10-CM | POA: Diagnosis not present

## 2017-01-29 DIAGNOSIS — Z6841 Body Mass Index (BMI) 40.0 and over, adult: Secondary | ICD-10-CM | POA: Diagnosis not present

## 2017-01-29 DIAGNOSIS — I340 Nonrheumatic mitral (valve) insufficiency: Secondary | ICD-10-CM | POA: Diagnosis not present

## 2017-01-29 DIAGNOSIS — M7989 Other specified soft tissue disorders: Secondary | ICD-10-CM | POA: Diagnosis not present

## 2017-01-29 DIAGNOSIS — I361 Nonrheumatic tricuspid (valve) insufficiency: Secondary | ICD-10-CM | POA: Diagnosis not present

## 2017-01-29 DIAGNOSIS — R6884 Jaw pain: Secondary | ICD-10-CM | POA: Diagnosis not present

## 2017-01-29 DIAGNOSIS — R079 Chest pain, unspecified: Secondary | ICD-10-CM | POA: Diagnosis not present

## 2017-02-03 ENCOUNTER — Other Ambulatory Visit: Payer: Self-pay | Admitting: Internal Medicine

## 2017-02-06 ENCOUNTER — Other Ambulatory Visit: Payer: Self-pay | Admitting: Internal Medicine

## 2017-02-07 NOTE — Telephone Encounter (Signed)
Routing to dr plotnikov, please advise, thanks 

## 2017-02-08 NOTE — Telephone Encounter (Signed)
[facility]

## 2017-02-11 ENCOUNTER — Telehealth: Payer: Self-pay | Admitting: Internal Medicine

## 2017-02-11 ENCOUNTER — Ambulatory Visit: Payer: BLUE CROSS/BLUE SHIELD | Admitting: Internal Medicine

## 2017-02-11 NOTE — Telephone Encounter (Signed)
Pls advise on msg below appt has been made for 02/23/17...Raechel Chute/lmb

## 2017-02-11 NOTE — Telephone Encounter (Signed)
Pt had an appointment scheduled for today but came in late and had to reschedule. She said that she is out of modafinil (PROVIGIL) 200 MG and will be out of triazolam (HALCION) 0.25 MG before she is able to be seen again. Are these able to be refilled?

## 2017-02-12 NOTE — Telephone Encounter (Signed)
Ok to fill both Thx 

## 2017-02-14 DIAGNOSIS — B354 Tinea corporis: Secondary | ICD-10-CM | POA: Diagnosis not present

## 2017-02-14 DIAGNOSIS — R110 Nausea: Secondary | ICD-10-CM | POA: Diagnosis not present

## 2017-02-14 MED ORDER — TRIAZOLAM 0.25 MG PO TABS: 0.2500 mg | ORAL_TABLET | Freq: Every day | ORAL | 0 refills | Status: DC

## 2017-02-14 MED ORDER — MODAFINIL 200 MG PO TABS
200.0000 mg | ORAL_TABLET | Freq: Every day | ORAL | 0 refills | Status: DC
Start: 2017-02-14 — End: 2017-02-28

## 2017-02-14 NOTE — Addendum Note (Signed)
Addended by: Deatra JamesBRAND, LUCY M on: 02/14/2017 08:21 AM   Modules accepted: Orders

## 2017-02-14 NOTE — Telephone Encounter (Signed)
Fax kept getting busy signal so I called refill into pharmacy spoke w/Fletcher(pharmacist) gave MD authorization on both meds...Raechel Chute/lmb

## 2017-02-14 NOTE — Telephone Encounter (Signed)
Called pt no answer LMOM MD ok [facility]...Raechel Chute/lmb

## 2017-02-15 ENCOUNTER — Other Ambulatory Visit: Payer: Self-pay | Admitting: Internal Medicine

## 2017-02-15 NOTE — Telephone Encounter (Signed)
Pt. Requesting Tramadol HCL, 50 mg tablets, 1 tablet twice daily.  Last filled 01/18/17.  Last OV on 12/03/16.  Please advise.  Thanks, Weyerhaeuser CompanyCindy

## 2017-02-18 DIAGNOSIS — R0789 Other chest pain: Secondary | ICD-10-CM | POA: Diagnosis not present

## 2017-02-18 DIAGNOSIS — R079 Chest pain, unspecified: Secondary | ICD-10-CM | POA: Diagnosis not present

## 2017-02-18 NOTE — Telephone Encounter (Signed)
Check [place] registry pt just got the Triazolam filled on 02/14/17 sent back DENIED not due until 03/1317. Tramadol was last filled 01/19/17. pls advise if Tramadol ok to refill...Raechel Chute

## 2017-02-22 ENCOUNTER — Other Ambulatory Visit: Payer: Self-pay | Admitting: Internal Medicine

## 2017-02-22 NOTE — Telephone Encounter (Signed)
Check [place] registry last filled 02/08/2017. scent request back denied not sue until 03/10/17..,.Raechel Chute

## 2017-02-23 ENCOUNTER — Ambulatory Visit: Payer: BLUE CROSS/BLUE SHIELD | Admitting: Internal Medicine

## 2017-02-28 ENCOUNTER — Other Ambulatory Visit: Payer: Self-pay | Admitting: *Deleted

## 2017-02-28 MED ORDER — ALBUTEROL SULFATE 108 (90 BASE) MCG/ACT IN AEPB: 1.0000 | INHALATION_SPRAY | Freq: Four times a day (QID) | RESPIRATORY_TRACT | 0 refills | Status: DC | PRN

## 2017-02-28 MED ORDER — PANTOPRAZOLE SODIUM 40 MG PO TBEC: 40.0000 mg | DELAYED_RELEASE_TABLET | Freq: Two times a day (BID) | ORAL | 11 refills | Status: DC

## 2017-02-28 MED ORDER — GABAPENTIN 300 MG PO CAPS: ORAL_CAPSULE | ORAL | 0 refills | Status: DC

## 2017-02-28 MED ORDER — CYANOCOBALAMIN 1000 MCG SL SUBL: 1.0000 | SUBLINGUAL_TABLET | Freq: Every day | SUBLINGUAL | 0 refills | Status: DC

## 2017-02-28 MED ORDER — LEVOTHYROXINE SODIUM 100 MCG PO TABS: 100.0000 ug | ORAL_TABLET | Freq: Every day | ORAL | 0 refills | Status: DC

## 2017-02-28 MED ORDER — BLACK COHOSH 540 MG PO CAPS: ORAL_CAPSULE | ORAL | 0 refills | Status: DC

## 2017-02-28 MED ORDER — CALCITRIOL 0.25 MCG PO CAPS: 0.2500 ug | ORAL_CAPSULE | Freq: Every day | ORAL | 0 refills | Status: DC

## 2017-02-28 MED ORDER — RANITIDINE HCL 150 MG PO TABS: 150.0000 mg | ORAL_TABLET | Freq: Every day | ORAL | 0 refills | Status: DC

## 2017-02-28 MED ORDER — LORATADINE 10 MG PO TABS: 10.0000 mg | ORAL_TABLET | Freq: Every day | ORAL | 0 refills | Status: DC

## 2017-02-28 NOTE — Telephone Encounter (Signed)
Rec'd call pt states she lost her job on Friday, and she have a flex spending card w/ over $500 on it. She is wanting to get 90 day scripts sent to [facility] in [place] for all her meds. Inform pt will send all the maintenance meds, but the Alprazolam and Provigil will have to approved by MD. Pls advise if ok to send 90 day on controls...Raechel Chute

## 2017-03-01 MED ORDER — MODAFINIL 200 MG PO TABS: 200.0000 mg | ORAL_TABLET | Freq: Every day | ORAL | 0 refills | Status: DC

## 2017-03-01 MED ORDER — ALPRAZOLAM 0.5 MG PO TABS: ORAL_TABLET | ORAL | 0 refills | Status: DC

## 2017-03-01 NOTE — Telephone Encounter (Signed)
Ok x all Thx

## 2017-03-01 NOTE — Telephone Encounter (Signed)
Notified pt MD ok 30 day for the provigil and alprazolam faxed script to [facility] pharmacy...Raechel Chute

## 2017-03-02 DIAGNOSIS — L300 Nummular dermatitis: Secondary | ICD-10-CM | POA: Diagnosis not present

## 2017-03-11 ENCOUNTER — Ambulatory Visit (INDEPENDENT_AMBULATORY_CARE_PROVIDER_SITE_OTHER): Payer: Self-pay | Admitting: Internal Medicine

## 2017-03-11 ENCOUNTER — Encounter: Payer: Self-pay | Admitting: Internal Medicine

## 2017-03-11 DIAGNOSIS — G4700 Insomnia, unspecified: Secondary | ICD-10-CM

## 2017-03-11 DIAGNOSIS — M545 Low back pain, unspecified: Secondary | ICD-10-CM | POA: Insufficient documentation

## 2017-03-11 DIAGNOSIS — F988 Other specified behavioral and emotional disorders with onset usually occurring in childhood and adolescence: Secondary | ICD-10-CM

## 2017-03-11 DIAGNOSIS — R0789 Other chest pain: Secondary | ICD-10-CM

## 2017-03-11 DIAGNOSIS — E559 Vitamin D deficiency, unspecified: Secondary | ICD-10-CM

## 2017-03-11 DIAGNOSIS — G8929 Other chronic pain: Secondary | ICD-10-CM

## 2017-03-11 DIAGNOSIS — M5440 Lumbago with sciatica, unspecified side: Secondary | ICD-10-CM

## 2017-03-11 DIAGNOSIS — G4733 Obstructive sleep apnea (adult) (pediatric): Secondary | ICD-10-CM

## 2017-03-11 DIAGNOSIS — E034 Atrophy of thyroid (acquired): Secondary | ICD-10-CM

## 2017-03-11 MED ORDER — TRAMADOL HCL 50 MG PO TABS: 50.0000 mg | ORAL_TABLET | Freq: Two times a day (BID) | ORAL | 3 refills | Status: DC

## 2017-03-11 MED ORDER — MODAFINIL 200 MG PO TABS: 200.0000 mg | ORAL_TABLET | Freq: Every day | ORAL | 5 refills | Status: DC

## 2017-03-11 MED ORDER — ALPRAZOLAM 0.5 MG PO TABS: ORAL_TABLET | ORAL | 2 refills | Status: DC

## 2017-03-11 NOTE — Assessment & Plan Note (Signed)
On Rocatrol

## 2017-03-11 NOTE — Assessment & Plan Note (Signed)
Tramadol prn ° Potential benefits of a long term opioids use as well as potential risks (i.e. addiction risk, apnea etc) and complications (i.e. Somnolence, constipation and others) were explained to the patient and were aknowledged. ° ° °

## 2017-03-11 NOTE — Assessment & Plan Note (Signed)
Off Adderall 

## 2017-03-11 NOTE — Assessment & Plan Note (Addendum)
On Levothroid 

## 2017-03-11 NOTE — Assessment & Plan Note (Signed)
02/18/17 [facility] ER visit for CP (-) Lexiscan Records reviewed

## 2017-03-11 NOTE — Assessment & Plan Note (Signed)
Chronic Change to Triazolam - d/c. Xanax works better for CPAP  Potential benefits of a long term benzodiazepines  use as well as potential risks  and complications were explained to the patient and were aknowledged.

## 2017-03-11 NOTE — Progress Notes (Signed)
Subjective:  Patient ID: Tiffany Medina, female    DOB: 07/24/1965  Age: 51 y.o. MRN: [id number]  CC: No chief complaint on file.   HPI Tiffany MagnusonKimberly Medina presents for anxiety,  LBP, insomnia f/u F/u [facility] ER visit for CP (-) Lexiscan   Outpatient Medications Prior to Visit  Medication Sig Dispense Refill  . Albuterol Sulfate (PROAIR RESPICLICK) 108 (90 Base) MCG/ACT AEPB Inhale 1-2 puffs into the lungs 4 (four) times daily as needed. 3 each 0  . ALPRAZolam (XANAX) 0.5 MG tablet TAKE 1 OR 2 TABLETS BY MOUTH TWICE DAILY AS NEEDED FOR ANXIETY 30 tablet 0  . Black Cohosh 540 MG CAPS 1 po qhs 90 capsule 0  . calcitRIOL (ROCALTROL) 0.25 MCG capsule Take 1 capsule (0.25 mcg total) by mouth daily. 90 capsule 0  . Cyanocobalamin 1000 MCG SUBL Take 1 tablet (1,000 mcg total) by mouth daily. 90 each 0  . diphenhydrAMINE (BENADRYL) 25 MG tablet Take 1 tablet (25 mg total) by mouth every 6 (six) hours as needed for allergies. 100 tablet 3  . gabapentin (NEURONTIN) 300 MG capsule TAKE 1 TO 2 CAPSULES AT BEDTIME FOR NERVE PAIN 270 capsule 0  . GOODSENSE ASPIRIN 325 MG tablet TAKE 1 TABLET BY MOUTH TWICE DAILY 100 tablet 3  . levothyroxine (SYNTHROID, LEVOTHROID) 100 MCG tablet Take 1 tablet (100 mcg total) by mouth daily before breakfast. 90 tablet 0  . loratadine (CLARITIN) 10 MG tablet Take 1 tablet (10 mg total) by mouth daily. 90 tablet 0  . modafinil (PROVIGIL) 200 MG tablet Take 1 tablet (200 mg total) by mouth daily. 30 tablet 0  . pantoprazole (PROTONIX) 40 MG tablet Take 1 tablet (40 mg total) by mouth 2 (two) times daily. 60 tablet 11  . ranitidine (ZANTAC) 150 MG tablet Take 1 tablet (150 mg total) by mouth daily. 90 tablet 0  . traMADol (ULTRAM) 50 MG tablet TAKE 1 TABLET TWICE DAILY 60 tablet 3  . triazolam (HALCION) 0.25 MG tablet TAKE 1 TABLET NIGHTLY AT BEDTIME AS NEEDED FOR SLEEP 30 tablet 3   No facility-administered medications prior to visit.     ROS Review of Systems    Constitutional: Negative for activity change, appetite change, chills, fatigue and unexpected weight change.  HENT: Negative for congestion, mouth sores and sinus pressure.   Eyes: Negative for visual disturbance.  Respiratory: Negative for cough and chest tightness.   Gastrointestinal: Negative for abdominal pain and nausea.  Genitourinary: Negative for difficulty urinating, frequency and vaginal pain.  Musculoskeletal: Negative for back pain and gait problem.  Skin: Negative for pallor and rash.  Neurological: Negative for dizziness, tremors, weakness, numbness and headaches.  Psychiatric/Behavioral: Positive for decreased concentration and sleep disturbance. Negative for confusion and suicidal ideas. The patient is nervous/anxious.     Objective:  BP 130/82 (BP Location: Left Arm, Patient Position: Sitting, Cuff Size: Large)   Pulse 95   Temp 98.1 F (36.7 C) (Oral)   Ht 5\' 5"  (1.651 m)   Wt 262 lb (118.8 kg)   SpO2 96%   BMI 43.60 kg/m   BP Readings from Last 3 Encounters:  03/11/17 130/82  12/03/16 138/90  12/02/16 135/76    Wt Readings from Last 3 Encounters:  03/11/17 262 lb (118.8 kg)  12/03/16 259 lb (117.5 kg)  09/24/16 246 lb 4 oz (111.7 kg)    Physical Exam  Constitutional: She appears well-developed. No distress.  HENT:  Head: Normocephalic.  Right Ear: External ear normal.  Left Ear: External ear normal.  Nose: Nose normal.  Mouth/Throat: Oropharynx is clear and moist.  Eyes: Pupils are equal, round, and reactive to light. Conjunctivae are normal. Right eye exhibits no discharge. Left eye exhibits no discharge.  Neck: Normal range of motion. Neck supple. No JVD present. No tracheal deviation present. No thyromegaly present.  Cardiovascular: Normal rate, regular rhythm and normal heart sounds.   Pulmonary/Chest: No stridor. No respiratory distress. She has no wheezes.  Abdominal: Soft. Bowel sounds are normal. She exhibits no distension and no mass. There  is no tenderness. There is no rebound and no guarding.  Musculoskeletal: She exhibits edema and tenderness.  Lymphadenopathy:    She has no cervical adenopathy.  Neurological: She displays normal reflexes. No cranial nerve deficit. She exhibits normal muscle tone. Coordination normal.  Skin: No rash noted. No erythema.  Psychiatric: She has a normal mood and affect. Her behavior is normal. Judgment and thought content normal.  Obese  Lab Results  Component Value Date   WBC 6.0 12/02/2016   HGB 13.3 12/02/2016   HCT 40.0 12/02/2016   PLT 316 12/02/2016   GLUCOSE 112 (H) 12/02/2016   CHOL 168 03/26/2016   TRIG 115.0 03/26/2016   HDL 49.30 03/26/2016   LDLCALC 95 03/26/2016   ALT 18 03/26/2016   AST 16 03/26/2016   NA 141 12/02/2016   K 4.1 12/02/2016   CL 105 12/02/2016   CREATININE 0.81 12/02/2016   BUN 14 12/02/2016   CO2 27 12/02/2016   TSH 0.58 06/18/2016   INR 1.02 11/28/2014    US Venous Img Lower Unilateral Left  Result Date: 12/02/2016 CLINICAL DATA:  Initial evaluation for acute left leg swelling. EXAM: Left LOWER EXTREMITY VENOUS DOPPLER ULTRASOUND TECHNIQUE: Gray-scale sonography with graded compression, as well as color Doppler and duplex ultrasound were performed to evaluate the lower extremity deep venous systems from the level of the common femoral vein and including the common femoral, femoral, profunda femoral, popliteal and calf veins including the posterior tibial, peroneal and gastrocnemius veins when visible. The superficial great saphenous vein was also interrogated. Spectral Doppler was utilized to evaluate flow at rest and with distal augmentation maneuvers in the common femoral, femoral and popliteal veins. COMPARISON:  None. FINDINGS: Contralateral Common Femoral Vein: Respiratory phasicity is normal and symmetric with the symptomatic side. No evidence of thrombus. Normal compressibility. Common Femoral Vein: No evidence of thrombus. Normal compressibility,  respiratory phasicity and response to augmentation. Saphenofemoral Junction: No evidence of thrombus. Normal compressibility and flow on color Doppler imaging. Profunda Femoral Vein: No evidence of thrombus. Normal compressibility and flow on color Doppler imaging. Femoral Vein: No evidence of thrombus. Normal compressibility, respiratory phasicity and response to augmentation. Popliteal Vein: No evidence of thrombus. Normal compressibility, respiratory phasicity and response to augmentation. Calf Veins: No evidence of thrombus. Normal compressibility and flow on color Doppler imaging. Superficial Great Saphenous Vein: No evidence of thrombus. Normal compressibility and flow on color Doppler imaging. Venous Reflux:  None. Other Findings:  None. IMPRESSION: No evidence of DVT within the left lower extremity. Electronically Signed   By: Rise Mu M.D.   On: 12/02/2016 17:32    Assessment & Plan:   There are no diagnoses linked to this encounter. I am having Ms. Barton maintain her diphenhydrAMINE, GOODSENSE ASPIRIN, traMADol, triazolam, ranitidine, pantoprazole, loratadine, levothyroxine, gabapentin, Cyanocobalamin, calcitRIOL, Black Cohosh, Albuterol Sulfate, modafinil, and ALPRAZolam.  No orders of the defined types were placed in this encounter.    Follow-up:  No Follow-up on file.  Walker Kehr, MD

## 2017-03-11 NOTE — Assessment & Plan Note (Signed)
Provigil

## 2017-03-11 NOTE — Assessment & Plan Note (Signed)
Appt w/Dr Beasley pending 

## 2017-03-15 ENCOUNTER — Encounter (INDEPENDENT_AMBULATORY_CARE_PROVIDER_SITE_OTHER): Payer: BLUE CROSS/BLUE SHIELD

## 2017-03-29 ENCOUNTER — Encounter (INDEPENDENT_AMBULATORY_CARE_PROVIDER_SITE_OTHER): Payer: BLUE CROSS/BLUE SHIELD

## 2017-04-11 ENCOUNTER — Ambulatory Visit (INDEPENDENT_AMBULATORY_CARE_PROVIDER_SITE_OTHER): Payer: BLUE CROSS/BLUE SHIELD | Admitting: Family Medicine

## 2017-04-11 ENCOUNTER — Encounter (INDEPENDENT_AMBULATORY_CARE_PROVIDER_SITE_OTHER): Payer: Self-pay | Admitting: Family Medicine

## 2017-04-11 VITALS — BP 114/82 | HR 97 | Temp 98.0°F | Ht 64.0 in | Wt 262.0 lb

## 2017-04-11 DIAGNOSIS — R739 Hyperglycemia, unspecified: Secondary | ICD-10-CM | POA: Insufficient documentation

## 2017-04-11 DIAGNOSIS — Z1331 Encounter for screening for depression: Secondary | ICD-10-CM | POA: Diagnosis not present

## 2017-04-11 DIAGNOSIS — Z720 Tobacco use: Secondary | ICD-10-CM | POA: Diagnosis not present

## 2017-04-11 DIAGNOSIS — R0602 Shortness of breath: Secondary | ICD-10-CM

## 2017-04-11 DIAGNOSIS — Z6841 Body Mass Index (BMI) 40.0 and over, adult: Secondary | ICD-10-CM

## 2017-04-11 DIAGNOSIS — E038 Other specified hypothyroidism: Secondary | ICD-10-CM

## 2017-04-11 DIAGNOSIS — Z9189 Other specified personal risk factors, not elsewhere classified: Secondary | ICD-10-CM

## 2017-04-11 DIAGNOSIS — Z0289 Encounter for other administrative examinations: Secondary | ICD-10-CM

## 2017-04-11 DIAGNOSIS — R5383 Other fatigue: Secondary | ICD-10-CM | POA: Diagnosis not present

## 2017-04-12 LAB — COMPREHENSIVE METABOLIC PANEL
ALBUMIN: 4.2 g/dL (ref 3.5–5.5)
ALT: 25 IU/L (ref 0–32)
AST: 24 IU/L (ref 0–40)
Albumin/Globulin Ratio: 1.7 (ref 1.2–2.2)
Alkaline Phosphatase: 90 IU/L (ref 39–117)
BUN / CREAT RATIO: 26 — AB (ref 9–23)
BUN: 15 mg/dL (ref 6–24)
Bilirubin Total: <0.2 mg/dL (ref 0.0–1.2)
CALCIUM: 9.3 mg/dL (ref 8.7–10.2)
CO2: 22 mmol/L (ref 20–29)
CREATININE: 0.57 mg/dL (ref 0.57–1.00)
Chloride: 104 mmol/L (ref 96–106)
GFR, EST AFRICAN AMERICAN: 123 mL/min/{1.73_m2} (ref >59)
GFR, EST NON AFRICAN AMERICAN: 107 mL/min/{1.73_m2} (ref >59)
GLUCOSE: 95 mg/dL (ref 65–99)
Globulin, Total: 2.5 g/dL (ref 1.5–4.5)
Potassium: 4.5 mmol/L (ref 3.5–5.2)
Sodium: 141 mmol/L (ref 134–144)
TOTAL PROTEIN: 6.7 g/dL (ref 6.0–8.5)

## 2017-04-12 LAB — CBC WITH DIFFERENTIAL
Basophils Absolute: 0 10*3/uL (ref 0.0–0.2)
Basos: 0 %
EOS (ABSOLUTE): 0.2 10*3/uL (ref 0.0–0.4)
EOS: 3 %
HEMATOCRIT: 41.6 % (ref 34.0–46.6)
HEMOGLOBIN: 13.9 g/dL (ref 11.1–15.9)
IMMATURE GRANS (ABS): 0 10*3/uL (ref 0.0–0.1)
Immature Granulocytes: 0 %
LYMPHS ABS: 1.2 10*3/uL (ref 0.7–3.1)
LYMPHS: 16 %
MCH: 30.8 pg (ref 26.6–33.0)
MCHC: 33.4 g/dL (ref 31.5–35.7)
MCV: 92 fL (ref 79–97)
MONOCYTES: 8 %
Monocytes Absolute: 0.6 10*3/uL (ref 0.1–0.9)
NEUTROS ABS: 5.4 10*3/uL (ref 1.4–7.0)
NEUTROS PCT: 73 %
RBC: 4.51 x10E6/uL (ref 3.77–5.28)
RDW: 13.6 % (ref 12.3–15.4)
WBC: 7.4 10*3/uL (ref 3.4–10.8)

## 2017-04-12 LAB — VITAMIN D 25 HYDROXY (VIT D DEFICIENCY, FRACTURES): VIT D 25 HYDROXY: 21.2 ng/mL — AB (ref 30.0–100.0)

## 2017-04-12 LAB — T4, FREE: Free T4: 1.5 ng/dL (ref 0.82–1.77)

## 2017-04-12 LAB — INSULIN, RANDOM: INSULIN: 23.7 u[IU]/mL (ref 2.6–24.9)

## 2017-04-12 LAB — FOLATE: FOLATE: 13.2 ng/mL (ref >3.0)

## 2017-04-12 LAB — HEMOGLOBIN A1C
Est. average glucose Bld gHb Est-mCnc: 126 mg/dL
Hgb A1c MFr Bld: 6 % — ABNORMAL HIGH (ref 4.8–5.6)

## 2017-04-12 LAB — LIPID PANEL WITH LDL/HDL RATIO
CHOLESTEROL TOTAL: 184 mg/dL (ref 100–199)
HDL: 54 mg/dL (ref >39)
LDL Calculated: 102 mg/dL — ABNORMAL HIGH (ref 0–99)
LDl/HDL Ratio: 1.9 ratio (ref 0.0–3.2)
Triglycerides: 139 mg/dL (ref 0–149)
VLDL CHOLESTEROL CAL: 28 mg/dL (ref 5–40)

## 2017-04-12 LAB — T3: T3 TOTAL: 151 ng/dL (ref 71–180)

## 2017-04-12 LAB — TSH: TSH: 0.399 u[IU]/mL — ABNORMAL LOW (ref 0.450–4.500)

## 2017-04-12 LAB — VITAMIN B12: Vitamin B-12: 1204 pg/mL (ref 232–1245)

## 2017-04-12 NOTE — Progress Notes (Signed)
.  Office: [phone]  /  Fax: [phone]   Dear Dr. Posey Rea,   Thank you for referring Tiffany Medina to our clinic. The following note includes my evaluation and treatment recommendations.  HPI:   Chief Complaint: OBESITY    Tiffany Medina has been referred by Tiffany Quint. Plotnikov, MD for consultation regarding her obesity and obesity related comorbidities.    Tiffany Medina (MR# [id number]) is a 51 y.o. female who presents on 04/12/2017 for obesity evaluation and treatment. Current BMI is Body mass index is 44.97 kg/m.Tiffany Medina has been struggling with her weight for many years and has been unsuccessful in either losing weight, maintaining weight loss, or reaching her healthy weight goal.     Tiffany Medina attended our information session and states she is currently in the action stage of change and ready to dedicate time achieving and maintaining a healthier weight. Tiffany Medina is interested in becoming our patient and working on intensive lifestyle modifications including (but not limited to) diet, exercise and weight loss.    Tiffany Medina states her family eats meals together she thinks her family will eat healthier with  her her desired weight loss is 112 lbs she has been heavy most of  her life she started gaining weight after childbirth her heaviest weight ever was 262 lbs. she has significant food cravings issues  she snacks frequently in the evenings she skips meals frequently she is frequently drinking liquids with calories she frequently makes poor food choices she has problems with excessive hunger  she frequently eats larger portions than normal  she has binge eating behaviors she struggles with emotional eating    Fatigue Tiffany Medina feels her energy is lower than it should be. This has worsened with weight gain and has not worsened recently. Tiffany Medina denies daytime somnolence and  admits to waking up still tired. Patient is at risk for obstructive sleep apnea. Patent has a  history of symptoms of daytime fatigue, morning fatigue and morning headache. Patient generally gets 3 or 4 hours of sleep per night, and states they generally have restless sleep. Snoring is present. Apneic episodes are present. Epworth Sleepiness Score is 15  Dyspnea on exertion Tiffany Medina notes increasing shortness of breath with exercising and seems to be worsening over time with weight gain. She notes getting out of breath sooner with activity than she used to. This has not gotten worse recently.  Hypothyroid Tiffany Medina has a diagnosis of hypothyroidism. She is on synthroid. She denies hot or cold intolerance or palpitations, but does admit to ongoing fatigue.  Hyperglycemia Tiffany Medina has a history of some elevated fasting blood glucose readings without a diagnosis of diabetes. She admits to polyphagia.  Tobacco Use Tiffany Medina still smokes cigarettes, 40 pack per year history and smokes 1 pack per day on average. Tiffany Medina has COPD.  At risk for cardiovascular disease Tiffany Medina is at a higher than average risk for cardiovascular disease due to obesity. She currently denies any chest pain.   Depression Screen Tiffany Medina's Food and Mood (modified PHQ-9) score was  Depression screen PHQ 2/9 04/11/2017  Decreased Interest 3  Down, Depressed, Hopeless 3  PHQ - 2 Score 6  Altered sleeping 3  Tired, decreased energy 3  Change in appetite 3  Feeling bad or failure about yourself  2  Trouble concentrating 3  Moving slowly or fidgety/restless 3  Suicidal thoughts 1  PHQ-9 Score 24  Difficult doing work/chores Very difficult    ALLERGIES: Allergies  Allergen Reactions  . Dihydrotachysterol Hives, Itching and  Rash  . Vitamin D Analogs Hives, Itching and Rash    Flushing    MEDICATIONS: Current Outpatient Prescriptions on File Prior to Visit  Medication Sig Dispense Refill  . Albuterol Sulfate (PROAIR RESPICLICK) 108 (90 Base) MCG/ACT AEPB Inhale 1-2 puffs into the lungs 4 (four) times  daily as needed. 3 each 0  . ALPRAZolam (XANAX) 0.5 MG tablet TAKE 1 OR 2 TABLETS BY MOUTH TWICE DAILY AS NEEDED FOR insomnia Take one in a daytime if needed for anxiety 90 tablet 2  . Black Cohosh 540 MG CAPS 1 po qhs 90 capsule 0  . calcitRIOL (ROCALTROL) 0.25 MCG capsule Take 1 capsule (0.25 mcg total) by mouth daily. 90 capsule 0  . Cyanocobalamin 1000 MCG SUBL Take 1 tablet (1,000 mcg total) by mouth daily. 90 each 0  . diphenhydrAMINE (BENADRYL) 25 MG tablet Take 1 tablet (25 mg total) by mouth every 6 (six) hours as needed for allergies. 100 tablet 3  . gabapentin (NEURONTIN) 300 MG capsule TAKE 1 TO 2 CAPSULES AT BEDTIME FOR NERVE PAIN 270 capsule 0  . GOODSENSE ASPIRIN 325 MG tablet TAKE 1 TABLET BY MOUTH TWICE DAILY 100 tablet 3  . levothyroxine (SYNTHROID, LEVOTHROID) 100 MCG tablet Take 1 tablet (100 mcg total) by mouth daily before breakfast. 90 tablet 0  . loratadine (CLARITIN) 10 MG tablet Take 1 tablet (10 mg total) by mouth daily. 90 tablet 0  . modafinil (PROVIGIL) 200 MG tablet Take 1 tablet (200 mg total) by mouth daily. 30 tablet 5  . pantoprazole (PROTONIX) 40 MG tablet Take 1 tablet (40 mg total) by mouth 2 (two) times daily. 60 tablet 11  . ranitidine (ZANTAC) 150 MG tablet Take 1 tablet (150 mg total) by mouth daily. 90 tablet 0  . traMADol (ULTRAM) 50 MG tablet Take 1 tablet (50 mg total) by mouth 2 (two) times daily. 60 tablet 3   No current facility-administered medications on file prior to visit.     PAST MEDICAL HISTORY: Past Medical History:  Diagnosis Date  . Ankle pain   . Anxiety   . Asthma   . Back pain   . Chest pain   . Chronic fatigue syndrome   . Constipation   . COPD (chronic obstructive pulmonary disease) (HCC)   . Depression   . Discoid lupus   . DVT (deep venous thrombosis) (HCC)   . Dyspnea   . Family history of breast cancer   . Family history of colon cancer   . GERD (gastroesophageal reflux disease)   . Hot flashes   .  Hypothyroidism   . Insomnia   . Leg edema   . Lupus   . Neuropathy   . Obesity   . Sciatica   . Sleep apnea     PAST SURGICAL HISTORY: Past Surgical History:  Procedure Laterality Date  . ABDOMINAL HYSTERECTOMY    . APPENDECTOMY    . KNEE ARTHROSCOPY W/ MENISCAL REPAIR    . TUBAL LIGATION  1987  . VEIN LIGATION AND STRIPPING      SOCIAL HISTORY: Social History  Substance Use Topics  . Smoking status: Current Every Day Smoker    Packs/day: 1.00    Types: Cigarettes  . Smokeless tobacco: Never Used  . Alcohol use No    FAMILY HISTORY: Family History  Problem Relation Age of Onset  . Breast cancer Mother 90       bilateral breast cancer  . Hodgkin's lymphoma Father  dx in his 16s  . Lung cancer Father 54  . Lung cancer Sister 24       adenocarcinoma (also a smoker)  . Lupus Maternal Uncle   . Colon cancer Maternal Uncle        dx in his 33s  . Cancer Paternal Uncle        2 paternal uncles with cancer NOS  . Diabetes Maternal Grandmother   . Colon cancer Maternal Grandfather        late 43s  . Diabetes Paternal Grandmother   . Colon cancer Paternal Grandfather 90  . Breast cancer Sister 33  . Cancer Other   . Diabetes Other   . Mental illness Other     ROS: Review of Systems  Constitutional: Positive for malaise/fatigue.  HENT: Positive for congestion (nasal stuffiness) and hearing loss.        Dry Mouth Hoarsenss  Eyes:       Vision Changes Wear Glasses or Contacts  Respiratory: Positive for cough, shortness of breath (on exertion) and wheezing.   Cardiovascular: Positive for orthopnea. Negative for chest pain and palpitations.       Chest Pain/Discomfort Difficulty breathing while lying down Sudden Awakening from sleep with Shortness of Breath Calf/Leg Pain with Walking Leg Cramping Very Cold Feet or Hands  Gastrointestinal: Positive for constipation, heartburn and nausea.  Musculoskeletal: Positive for back pain and neck pain.        Muscle or Joint Pain Red or Swollen Joints  Neurological: Positive for weakness.  Endo/Heme/Allergies: Bruises/bleeds easily (easy bruising).       Negative Heat/Cold Intolerance Positive Polyphagia  Psychiatric/Behavioral: Positive for depression. The patient has insomnia.        Stress    PHYSICAL EXAM: Blood pressure 114/82, pulse 97, temperature 98 F (36.7 C), temperature source Oral, height  (1.626 m), weight 262 lb (118.8 kg), SpO2 95 %. Body mass index is 44.97 kg/m. Physical Exam  Constitutional: She is oriented to person, place, and time. She appears well-developed and well-nourished.  Cardiovascular: Normal rate.   Musculoskeletal: Normal range of motion.  Neurological: She is oriented to person, place, and time.  Skin: Skin is warm and dry.  Psychiatric: She has a normal mood and affect. Her behavior is normal.  Vitals reviewed.   RECENT LABS AND TESTS: BMET    Component Value Date/Time   NA 141 04/11/2017 1040   K 4.5 04/11/2017 1040   CL 104 04/11/2017 1040   CO2 22 04/11/2017 1040   GLUCOSE 95 04/11/2017 1040   GLUCOSE 112 (H) 12/02/2016 1756   BUN 15 04/11/2017 1040   CREATININE 0.57 04/11/2017 1040   CALCIUM 9.3 04/11/2017 1040   GFRNONAA 107 04/11/2017 1040   GFRAA 123 04/11/2017 1040   Lab Results  Component Value Date   HGBA1C 6.0 (H) 04/11/2017   Lab Results  Component Value Date   INSULIN 23.7 04/11/2017   CBC    Component Value Date/Time   WBC 7.4 04/11/2017 1040   WBC 6.0 12/02/2016 1756   RBC 4.51 04/11/2017 1040   RBC 4.20 12/02/2016 1756   HGB 13.9 04/11/2017 1040   HGB 14.4 12/25/2010 1504   HCT 41.6 04/11/2017 1040   HCT 41.5 12/25/2010 1504   PLT 316 12/02/2016 1756   PLT 329 12/25/2010 1504   MCV 92 04/11/2017 1040   MCV 94.7 12/25/2010 1504   MCH 30.8 04/11/2017 1040   MCH 31.7 12/02/2016 1756   MCHC 33.4 04/11/2017 1040  MCHC 33.3 12/02/2016 1756   RDW 13.6 04/11/2017 1040   RDW 13.0 12/25/2010 1504    LYMPHSABS 1.2 04/11/2017 1040   LYMPHSABS 1.7 12/25/2010 1504   MONOABS 0.5 12/02/2016 1756   MONOABS 0.8 12/25/2010 1504   EOSABS 0.2 04/11/2017 1040   BASOSABS 0.0 04/11/2017 1040   BASOSABS 0.0 12/25/2010 1504   Iron/TIBC/Ferritin/ %Sat No results found for: IRON, TIBC, FERRITIN, IRONPCTSAT Lipid Panel     Component Value Date/Time   CHOL 184 04/11/2017 1040   TRIG 139 04/11/2017 1040   HDL 54 04/11/2017 1040   CHOLHDL 3 03/26/2016 1704   VLDL 23.0 03/26/2016 1704   LDLCALC 102 (H) 04/11/2017 1040   Hepatic Function Panel     Component Value Date/Time   PROT 6.7 04/11/2017 1040   ALBUMIN 4.2 04/11/2017 1040   AST 24 04/11/2017 1040   ALT 25 04/11/2017 1040   ALKPHOS 90 04/11/2017 1040   BILITOT <0.2 04/11/2017 1040   BILIDIR 0.0 03/26/2016 1704      Component Value Date/Time   TSH 0.399 (L) 04/11/2017 1040   TSH 0.58 06/18/2016 1153   TSH 0.33 (L) 03/26/2016 1704    ECG  shows NSR with a rate of 95 BPM INDIRECT CALORIMETER done today shows a VO2 of 298 and a REE of 2073.  Her calculated basal metabolic rate is 6962 thus her basal metabolic rate is better than expected.    ASSESSMENT AND PLAN: Other fatigue - Plan: EKG 12-Lead, Vitamin B12, CBC With Differential, Comprehensive metabolic panel, Folate, Lipid Panel With LDL/HDL Ratio, VITAMIN D 25 Hydroxy (Vit-D Deficiency, Fractures)  Shortness of breath on exertion - Plan: CBC With Differential  Other specified hypothyroidism - Plan: T3, T4, free, TSH  Tobacco use  Hyperglycemia - Plan: Comprehensive metabolic panel, Hemoglobin A1c, Insulin, random  Depression screening  At risk for heart disease  Class 3 severe obesity with serious comorbidity and body mass index (BMI) of 45.0 to 49.9 in adult, unspecified obesity type (HCC)  PLAN: Fatigue Jamiya was informed that her fatigue may be related to obesity, depression or many other causes. Labs will be ordered, and in the meanwhile Jolan has agreed to  work on diet, exercise and weight loss to help with fatigue. Proper sleep hygiene was discussed including the need for 7-8 hours of quality sleep each night. A sleep study was not ordered based on symptoms and Epworth score.  Dyspnea on exertion Lovelle's shortness of breath appears to be obesity related and exercise induced. She has agreed to work on weight loss and gradually increase exercise to treat her exercise induced shortness of breath. If Ashlay follows our instructions and loses weight without improvement of her shortness of breath, we will plan to refer to pulmonology. We will monitor this condition regularly. Augustina agrees to this plan.  Hypothyroid Jonita was informed of the importance of good thyroid control to help with weight loss efforts. She was also informed that supertheraputic thyroid levels are dangerous and will not improve weight loss results. We will check labs and Sayaka will continue synthroid and follow up as directed.  Hyperglycemia Fasting labs will be obtained and results with be discussed with Tiffany Medina in 2 weeks at her follow up visit. In the meanwhile Shakerria was started on a lower simple carbohydrate diet and will work on weight loss efforts.  Tobacco Use Tobacco cessation was discussed today and Darshay agreed to work on weight loss first but to not increase smoking during this time, then will  work on smoking cessation. Catheline agrees to follow up with our clinic in 2 weeks.  Cardiovascular risk counseling Kameelah was given extended (15 minutes) coronary artery disease prevention counseling today. She is 51 y.o. female and has risk factors for heart disease including obesity. We discussed intensive lifestyle modifications today with an emphasis on specific weight loss instructions and strategies. Pt was also informed of the importance of increasing exercise and decreasing saturated fats to help prevent heart disease.  Depression Screen Aleane had  a strongly positive depression screening. Depression is commonly associated with obesity and often results in emotional eating behaviors. We will monitor this closely and work on CBT to help improve the non-hunger eating patterns. Referral to Psychology may be required if no improvement is seen as she continues in our clinic.  Obesity Keryl is currently in the action stage of change and her goal is to continue with weight loss efforts. I recommend Araminta begin the structured treatment plan as follows:  She has agreed to follow the Category 3 plan Parlee has been instructed to eventually work up to a goal of 150 minutes of combined cardio and strengthening exercise per week for weight loss and overall health benefits. We discussed the following Behavioral Modification Strategies today: increasing lean protein intake, decreasing simple carbohydrates  and work on meal planning and easy cooking plans   She was informed of the importance of frequent follow up visits to maximize her success with intensive lifestyle modifications for her multiple health conditions. She was informed we would discuss her lab results at her next visit unless there is a critical issue that needs to be addressed sooner. Ronin agreed to keep her next visit at the agreed upon time to discuss these results.  I, [place] Crane, am acting as transcriptionist for  Quillian Quince, MD  I have reviewed the above documentation for accuracy and completeness, and I agree with the above. -Quillian Quince, MD    OBESITY BEHAVIORAL INTERVENTION VISIT  Today's visit was # 1 out of 22.  Starting weight: 262 lbs Starting date: 04/11/17 Today's weight : 262 lbs Today's date: 04/11/17 Total lbs lost to date: 0 (Patients must lose 7 lbs in the first 6 months to continue with counseling)   ASK: We discussed the diagnosis of obesity with [name] today and Shuna agreed to give [place] permission to discuss obesity behavioral  modification therapy today.  ASSESS: Rebie has the diagnosis of obesity and her BMI today is 44.95 Diasha is in the action stage of change   ADVISE: Tamyia was educated on the multiple health risks of obesity as well as the benefit of weight loss to improve her health. She was advised of the need for long term treatment and the importance of lifestyle modifications.  AGREE: Multiple dietary modification options and treatment options were discussed and  Ashlynne agreed to follow the Category 3 plan We discussed the following Behavioral Modification Strategies today: increasing lean protein intake, decreasing simple carbohydrates  and work on meal planning and easy cooking plans

## 2017-04-19 DIAGNOSIS — R0781 Pleurodynia: Secondary | ICD-10-CM | POA: Diagnosis not present

## 2017-04-19 DIAGNOSIS — Z79899 Other long term (current) drug therapy: Secondary | ICD-10-CM | POA: Diagnosis not present

## 2017-04-19 DIAGNOSIS — Z86718 Personal history of other venous thrombosis and embolism: Secondary | ICD-10-CM | POA: Diagnosis not present

## 2017-04-19 DIAGNOSIS — F17210 Nicotine dependence, cigarettes, uncomplicated: Secondary | ICD-10-CM | POA: Diagnosis not present

## 2017-04-19 DIAGNOSIS — S20212A Contusion of left front wall of thorax, initial encounter: Secondary | ICD-10-CM | POA: Diagnosis not present

## 2017-04-19 DIAGNOSIS — Z888 Allergy status to other drugs, medicaments and biological substances status: Secondary | ICD-10-CM | POA: Diagnosis not present

## 2017-04-19 DIAGNOSIS — M419 Scoliosis, unspecified: Secondary | ICD-10-CM | POA: Diagnosis not present

## 2017-04-25 ENCOUNTER — Ambulatory Visit (INDEPENDENT_AMBULATORY_CARE_PROVIDER_SITE_OTHER): Payer: BLUE CROSS/BLUE SHIELD | Admitting: Family Medicine

## 2017-04-25 VITALS — BP 117/80 | HR 97 | Temp 98.3°F | Ht 64.0 in | Wt 252.0 lb

## 2017-04-25 DIAGNOSIS — Z6841 Body Mass Index (BMI) 40.0 and over, adult: Secondary | ICD-10-CM | POA: Diagnosis not present

## 2017-04-25 DIAGNOSIS — E7849 Other hyperlipidemia: Secondary | ICD-10-CM | POA: Diagnosis not present

## 2017-04-25 DIAGNOSIS — R7303 Prediabetes: Secondary | ICD-10-CM

## 2017-04-25 DIAGNOSIS — E559 Vitamin D deficiency, unspecified: Secondary | ICD-10-CM

## 2017-04-25 MED ORDER — METFORMIN HCL 500 MG PO TABS: 500.0000 mg | ORAL_TABLET | Freq: Every day | ORAL | 0 refills | Status: DC

## 2017-04-25 MED ORDER — CALCITRIOL 0.25 MCG PO CAPS: 0.2500 ug | ORAL_CAPSULE | Freq: Two times a day (BID) | ORAL | 0 refills | Status: DC

## 2017-04-25 NOTE — Progress Notes (Signed)
Office: [phone]  /  Fax: [phone]   HPI:   Chief Complaint: OBESITY Tiffany Medina is here to discuss her progress with her obesity treatment plan. She is on the Category 3 plan and is following her eating plan approximately 85 % of the time. She states she is exercising 0 minutes 0 times per week. Tiffany Medina did well with weight loss, she didn't like the brad options and would like fruit options, and she misses eating mayonnaise.  Her weight is 252 lb (114.3 kg) today and has had a weight loss of 10 pounds over a period of 2 weeks since her last visit. She has lost 10 lbs since starting treatment with [place].  Hyperlipidemia Tiffany Medina has hyperlipidemia and has been trying to improve her cholesterol levels with intensive lifestyle modification including a low saturated fat diet, exercise and weight loss. LDL slightly elevated, she is attempting to diet control.  She denies any chest pain, claudication or myalgias.  Vitamin D deficiency Tiffany Medina has a diagnosis of vitamin D deficiency. She is currently on Calcitriol. She notes fatigue and denies nausea, vomiting or muscle weakness. Tiffany Medina has had GI upset with Ergocalciferol.  Pre-Diabetes Tiffany Medina has a new diagnosis of pre-diabetes based on her elevated Hgb A1c @ 6.0 and was informed this puts her at greater risk of developing diabetes. She is not taking metformin currently and continues to work on diet and exercise to decrease risk of diabetes. She notes polyphagia and denies nausea or hypoglycemia.  ALLERGIES: Allergies  Allergen Reactions  . Dihydrotachysterol Hives, Itching and Rash  . Vitamin D Analogs Hives, Itching and Rash    Flushing    MEDICATIONS: Current Outpatient Prescriptions on File Prior to Visit  Medication Sig Dispense Refill  . Albuterol Sulfate (PROAIR RESPICLICK) 108 (90 Base) MCG/ACT AEPB Inhale 1-2 puffs into the lungs 4 (four) times daily as needed. 3 each 0  . ALPRAZolam (XANAX) 0.5 MG tablet TAKE 1 OR 2  TABLETS BY MOUTH TWICE DAILY AS NEEDED FOR insomnia Take one in a daytime if needed for anxiety 90 tablet 2  . Black Cohosh 540 MG CAPS 1 po qhs 90 capsule 0  . Cyanocobalamin 1000 MCG SUBL Take 1 tablet (1,000 mcg total) by mouth daily. 90 each 0  . diphenhydrAMINE (BENADRYL) 25 MG tablet Take 1 tablet (25 mg total) by mouth every 6 (six) hours as needed for allergies. 100 tablet 3  . gabapentin (NEURONTIN) 300 MG capsule TAKE 1 TO 2 CAPSULES AT BEDTIME FOR NERVE PAIN 270 capsule 0  . GOODSENSE ASPIRIN 325 MG tablet TAKE 1 TABLET BY MOUTH TWICE DAILY 100 tablet 3  . Iron-Vitamins (GERITOL COMPLETE PO) Take 1 tablet by mouth daily.    Marland Kitchen levothyroxine (SYNTHROID, LEVOTHROID) 100 MCG tablet Take 1 tablet (100 mcg total) by mouth daily before breakfast. 90 tablet 0  . loratadine (CLARITIN) 10 MG tablet Take 1 tablet (10 mg total) by mouth daily. 90 tablet 0  . modafinil (PROVIGIL) 200 MG tablet Take 1 tablet (200 mg total) by mouth daily. 30 tablet 5  . pantoprazole (PROTONIX) 40 MG tablet Take 1 tablet (40 mg total) by mouth 2 (two) times daily. 60 tablet 11  . ranitidine (ZANTAC) 150 MG tablet Take 1 tablet (150 mg total) by mouth daily. 90 tablet 0  . traMADol (ULTRAM) 50 MG tablet Take 1 tablet (50 mg total) by mouth 2 (two) times daily. 60 tablet 3   No current facility-administered medications on file prior to visit.  PAST MEDICAL HISTORY: Past Medical History:  Diagnosis Date  . Ankle pain   . Anxiety   . Asthma   . Back pain   . Chest pain   . Chronic fatigue syndrome   . Constipation   . COPD (chronic obstructive pulmonary disease) (HCC)   . Depression   . Discoid lupus   . DVT (deep venous thrombosis) (HCC)   . Dyspnea   . Family history of breast cancer   . Family history of colon cancer   . GERD (gastroesophageal reflux disease)   . Hot flashes   . Hypothyroidism   . Insomnia   . Leg edema   . Lupus   . Neuropathy   . Obesity   . Sciatica   . Sleep apnea      PAST SURGICAL HISTORY: Past Surgical History:  Procedure Laterality Date  . ABDOMINAL HYSTERECTOMY    . APPENDECTOMY    . KNEE ARTHROSCOPY W/ MENISCAL REPAIR    . TUBAL LIGATION  1987  . VEIN LIGATION AND STRIPPING      SOCIAL HISTORY: Social History  Substance Use Topics  . Smoking status: Current Every Day Smoker    Packs/day: 1.00    Types: Cigarettes  . Smokeless tobacco: Never Used  . Alcohol use No    FAMILY HISTORY: Family History  Problem Relation Age of Onset  . Breast cancer Mother [id number]       bilateral breast cancer  . Hodgkin's lymphoma Father        dx in his 4940s  . Lung cancer Father [id number]  . Lung cancer Sister 139       adenocarcinoma (also a smoker)  . Lupus Maternal Uncle   . Colon cancer Maternal Uncle        dx in his 6250s  . Cancer Paternal Uncle        2 paternal uncles with cancer NOS  . Diabetes Maternal Grandmother   . Colon cancer Maternal Grandfather        late 1580s  . Diabetes Paternal Grandmother   . Colon cancer Paternal Grandfather 1960  . Breast cancer Sister 5365  . Cancer Other   . Diabetes Other   . Mental illness Other     ROS: Review of Systems  Constitutional: Positive for malaise/fatigue and weight loss.  Cardiovascular: Negative for chest pain and claudication.  Gastrointestinal: Negative for nausea and vomiting.  Musculoskeletal: Negative for myalgias.       Negative muscle weakness  Endo/Heme/Allergies:       Negative hypoglycemia Positive polyphagia    PHYSICAL EXAM: Blood pressure 117/80, pulse 97, temperature 98.3 F (36.8 C), temperature source Oral, height 5\' 4"  (1.626 m), weight 252 lb (114.3 kg), SpO2 97 %. Body mass index is 43.26 kg/m. Physical Exam  Constitutional: She is oriented to person, place, and time. She appears well-developed and well-nourished.  Cardiovascular: Normal rate.   Pulmonary/Chest: Effort normal.  Musculoskeletal: Normal range of motion.  Neurological: She is oriented to person,  place, and time.  Skin: Skin is warm and dry.  Psychiatric: She has a normal mood and affect. Her behavior is normal.  Vitals reviewed.   RECENT LABS AND TESTS: BMET    Component Value Date/Time   NA 141 04/11/2017 1040   K 4.5 04/11/2017 1040   CL 104 04/11/2017 1040   CO2 22 04/11/2017 1040   GLUCOSE 95 04/11/2017 1040   GLUCOSE 112 (H) 12/02/2016 1756   BUN 15 04/11/2017 1040  CREATININE 0.57 04/11/2017 1040   CALCIUM 9.3 04/11/2017 1040   GFRNONAA 107 04/11/2017 1040   GFRAA 123 04/11/2017 1040   Lab Results  Component Value Date   HGBA1C 6.0 (H) 04/11/2017   Lab Results  Component Value Date   INSULIN 23.7 04/11/2017   CBC    Component Value Date/Time   WBC 7.4 04/11/2017 1040   WBC 6.0 12/02/2016 1756   RBC 4.51 04/11/2017 1040   RBC 4.20 12/02/2016 1756   HGB 13.9 04/11/2017 1040   HGB 14.4 12/25/2010 1504   HCT 41.6 04/11/2017 1040   HCT 41.5 12/25/2010 1504   PLT 316 12/02/2016 1756   PLT 329 12/25/2010 1504   MCV 92 04/11/2017 1040   MCV 94.7 12/25/2010 1504   MCH 30.8 04/11/2017 1040   MCH 31.7 12/02/2016 1756   MCHC 33.4 04/11/2017 1040   MCHC 33.3 12/02/2016 1756   RDW 13.6 04/11/2017 1040   RDW 13.0 12/25/2010 1504   LYMPHSABS 1.2 04/11/2017 1040   LYMPHSABS 1.7 12/25/2010 1504   MONOABS 0.5 12/02/2016 1756   MONOABS 0.8 12/25/2010 1504   EOSABS 0.2 04/11/2017 1040   BASOSABS 0.0 04/11/2017 1040   BASOSABS 0.0 12/25/2010 1504   Iron/TIBC/Ferritin/ %Sat No results found for: IRON, TIBC, FERRITIN, IRONPCTSAT Lipid Panel     Component Value Date/Time   CHOL 184 04/11/2017 1040   TRIG 139 04/11/2017 1040   HDL 54 04/11/2017 1040   CHOLHDL 3 03/26/2016 1704   VLDL 23.0 03/26/2016 1704   LDLCALC 102 (H) 04/11/2017 1040   Hepatic Function Panel     Component Value Date/Time   PROT 6.7 04/11/2017 1040   ALBUMIN 4.2 04/11/2017 1040   AST 24 04/11/2017 1040   ALT 25 04/11/2017 1040   ALKPHOS 90 04/11/2017 1040   BILITOT <0.2  04/11/2017 1040   BILIDIR 0.0 03/26/2016 1704      Component Value Date/Time   TSH 0.399 (L) 04/11/2017 1040   TSH 0.58 06/18/2016 1153   TSH 0.33 (L) 03/26/2016 1704    ASSESSMENT AND PLAN:  Vitamin D deficiency - Plan: calcitRIOL (ROCALTROL) 0.25 MCG capsule  Other hyperlipidemia  Prediabetes - Plan: metFORMIN (GLUCOPHAGE) 500 MG tablet  Class 3 severe obesity with serious comorbidity and body mass index (BMI) of 40.0 to 44.9 in adult, unspecified obesity type (HCC)  PLAN:  Hyperlipidemia Tiffany Medina was informed of the American Heart Association Guidelines emphasizing intensive lifestyle modifications as the first line treatment for hyperlipidemia. We discussed many lifestyle modifications today in depth, and Tiffany Medina Tiffany Medina continue to work on decreasing saturated fats such as fatty red meat, butter and many fried foods. She Tiffany Medina also increase vegetables and lean protein in her diet and continue to work on exercise, diet, and weight loss efforts. We Tiffany Medina recheck labs in 3 months and Tiffany Medina agrees to follow up with our clinic in 2 weeks.  Vitamin D Deficiency Tiffany Medina was informed that low vitamin D levels contributes to fatigue and are associated with obesity, breast, and colon cancer. She agrees to increase Calcitriol to BID #60 with no refills. She Tiffany Medina follow up for routine testing of vitamin D, at least 2-3 times per year. She was informed of the risk of over-replacement of vitamin D and agrees to not increase her dose unless he discusses this with [place] first. We Tiffany Medina recheck labs in 3 months and Tiffany Medina agrees to follow up with our clinic in 2 weeks.  Pre-Diabetes Tiffany Medina Tiffany Medina continue to work on weight loss, exercise,  and decreasing simple carbohydrates in her diet to help decrease the risk of diabetes. We dicussed metformin including benefits and risks. She was informed that eating too many simple carbohydrates or too many calories at one sitting increases the likelihood of GI  side effects. Tiffany Medina agrees to start metformin 500 mg q AM #30 with no refills. Tiffany Medina agrees to follow up with our clinic in 2 weeks directed to monitor her progress.  Obesity Tiffany Medina is currently in the action stage of change. As such, her goal is to continue with weight loss efforts She has agreed to change to  the Pescatarian eating plan + 200 calories Tiffany Medina has been instructed to work up to a goal of 150 minutes of combined cardio and strengthening exercise per week for weight loss and overall health benefits. We discussed the following Behavioral Modification Strategies today: increasing lean protein intake, decreasing simple carbohydrates, and better snacking choices.    Tiffany Medina has agreed to follow up with our clinic in 2 weeks. She was informed of the importance of frequent follow up visits to maximize her success with intensive lifestyle modifications for her multiple health conditions.  I, Burt Knack, am acting as transcriptionist for Quillian Quince, MD  I have reviewed the above documentation for accuracy and completeness, and I agree with the above. -Quillian Quince, MD     Today's visit was # 2 out of 22.  Starting weight: 262 lbs Starting date: 04/11/17 Today's weight : 252 lbs  Today's date: 04/25/2017 Total lbs lost to date: 10 (Patients must lose 7 lbs in the first 6 months to continue with counseling)   ASK: We discussed the diagnosis of obesity with Tiffany Medina today and Tiffany Medina agreed to give [place] permission to discuss obesity behavioral modification therapy today.  ASSESS: Tiffany Medina has the diagnosis of obesity and her BMI today is 10 Tiffany Medina is in the action stage of change   ADVISE: Tiffany Medina was educated on the multiple health risks of obesity as well as the benefit of weight loss to improve her health. She was advised of the need for long term treatment and the importance of lifestyle modifications.  AGREE: Multiple dietary modification  options and treatment options were discussed and  Tiffany Medina agreed to follow the Pescatarian eating plan + 200 calories We discussed the following Behavioral Modification Strategies today: increasing lean protein intake, decreasing simple carbohydrates, and better snacking choices.

## 2017-04-26 DIAGNOSIS — L299 Pruritus, unspecified: Secondary | ICD-10-CM | POA: Diagnosis not present

## 2017-04-26 DIAGNOSIS — L300 Nummular dermatitis: Secondary | ICD-10-CM | POA: Diagnosis not present

## 2017-05-09 ENCOUNTER — Ambulatory Visit (INDEPENDENT_AMBULATORY_CARE_PROVIDER_SITE_OTHER): Payer: BLUE CROSS/BLUE SHIELD | Admitting: Family Medicine

## 2017-05-09 ENCOUNTER — Telehealth: Payer: Self-pay | Admitting: Internal Medicine

## 2017-05-09 MED ORDER — MODAFINIL 200 MG PO TABS: 200.0000 mg | ORAL_TABLET | Freq: Every day | ORAL | 5 refills | Status: DC

## 2017-05-09 NOTE — Telephone Encounter (Signed)
Ok Thx 

## 2017-05-09 NOTE — Telephone Encounter (Signed)
Pt called for a refill of her  modafinil (PROVIGIL) 200 MG tablet  Please advise and call back  POF

## 2017-05-09 NOTE — Telephone Encounter (Signed)
Notified pt rx called into [facility]...Raechel Chute/lmb

## 2017-05-09 NOTE — Telephone Encounter (Signed)
pls advise on msg below.../lmb 

## 2017-05-12 ENCOUNTER — Telehealth (INDEPENDENT_AMBULATORY_CARE_PROVIDER_SITE_OTHER): Payer: Self-pay | Admitting: Family Medicine

## 2017-05-12 NOTE — Telephone Encounter (Signed)
Pt is scheduled on 11/19 , she will run out of metformin and Vitamin D.  She wants to know if she can wait until appointment or does refill need to be called in.  You can leave detailed info on her phone if she does not answer

## 2017-05-12 NOTE — Telephone Encounter (Signed)
Pt is scheduled on 11/19, she will run out metformin before appt and wants to know if she needs to continue med or need refill No refills on Vitamin D will also need refill if need be

## 2017-05-12 NOTE — Telephone Encounter (Signed)
Error

## 2017-05-12 NOTE — Telephone Encounter (Signed)
Called the pharmacy and the last refill was on 10/22. Called the patient and asked her would she run out because based on the last refill she should have enough until her next follow up. Patient states she did not count her medication, her bottle just looked low so she thought she would call us. Told the patient we will refill her meds on her 11/19 visit unless she gets home and finds that she will indeed run out. Patient verbalized understanding. Aiko Belko, CMA

## 2017-05-16 ENCOUNTER — Other Ambulatory Visit: Payer: Self-pay | Admitting: Internal Medicine

## 2017-05-16 NOTE — Telephone Encounter (Signed)
Nuvigil loaded if okay.   Please advise

## 2017-05-16 NOTE — Telephone Encounter (Signed)
Pt called in and said that the provigil needs a PA, her ins will only cover Nuvgil, can this be call in asap.  She is working 12 hrs shift the next 3 days.  She only has 1 pill left

## 2017-05-17 MED ORDER — ARMODAFINIL 200 MG PO TABS: 1.0000 | ORAL_TABLET | Freq: Every day | ORAL | 5 refills | Status: DC

## 2017-05-17 NOTE — Telephone Encounter (Signed)
RX faxed

## 2017-05-23 ENCOUNTER — Ambulatory Visit (INDEPENDENT_AMBULATORY_CARE_PROVIDER_SITE_OTHER): Payer: BLUE CROSS/BLUE SHIELD | Admitting: Family Medicine

## 2017-05-23 VITALS — BP 121/79 | HR 96 | Temp 98.0°F | Ht 64.0 in | Wt 245.0 lb

## 2017-05-23 DIAGNOSIS — R7303 Prediabetes: Secondary | ICD-10-CM | POA: Diagnosis not present

## 2017-05-23 DIAGNOSIS — E559 Vitamin D deficiency, unspecified: Secondary | ICD-10-CM

## 2017-05-23 DIAGNOSIS — Z6841 Body Mass Index (BMI) 40.0 and over, adult: Secondary | ICD-10-CM | POA: Diagnosis not present

## 2017-05-23 MED ORDER — CALCITRIOL 0.25 MCG PO CAPS: 0.2500 ug | ORAL_CAPSULE | Freq: Two times a day (BID) | ORAL | 0 refills | Status: DC

## 2017-05-23 MED ORDER — METFORMIN HCL 500 MG PO TABS: 500.0000 mg | ORAL_TABLET | Freq: Every day | ORAL | 0 refills | Status: DC

## 2017-05-23 NOTE — Progress Notes (Signed)
Office: [phone]  /  Fax: [phone]   HPI:   Chief Complaint: OBESITY Tiffany Medina is here to discuss her progress with her obesity treatment plan. She is on the Pescatarian eating plan and is following her eating plan approximately 65 % of the time. She states she is exercising 0 minutes 0 times per week. Tiffany Medina continues to do well with weight loss, even while on steroids for recent rash. She tried to change to Pescatarian but she didn't like it and she changed back to the category 2 plan. Her weight is 245 lb (111.1 kg) today and has had a weight loss of 7 pounds over a period of 4 weeks since her last visit. She has lost 17 lbs since starting treatment with us.  Vitamin D deficiency Tiffany Medina has a diagnosis of vitamin D deficiency. She cannot take ergocalciferol and she is doing well on Calcitriol. She denies nausea, vomiting or muscle weakness.  Pre-Diabetes Tiffany Medina has a diagnosis of pre-diabetes based on her elevated Hgb A1c and was informed this puts her at greater risk of developing diabetes. She is taking metformin currently and continues to work on diet and exercise to decrease risk of diabetes. She denies nausea or hypoglycemia.  ALLERGIES: Allergies  Allergen Reactions  . Dihydrotachysterol Hives, Itching and Rash  . Vitamin D Analogs Hives, Itching and Rash    Flushing    MEDICATIONS: Current Outpatient Medications on File Prior to Visit  Medication Sig Dispense Refill  . Albuterol Sulfate (PROAIR RESPICLICK) 108 (90 Base) MCG/ACT AEPB Inhale 1-2 puffs into the lungs 4 (four) times daily as needed. 3 each 0  . ALPRAZolam (XANAX) 0.5 MG tablet TAKE 1 OR 2 TABLETS BY MOUTH TWICE DAILY AS NEEDED FOR insomnia Take one in a daytime if needed for anxiety 90 tablet 2  . Armodafinil 200 MG TABS Take 1 tablet daily by mouth. 30 tablet 5  . Black Cohosh 540 MG CAPS 1 po qhs 90 capsule 0  . calcitRIOL (ROCALTROL) 0.25 MCG capsule Take 1 capsule (0.25 mcg total) by mouth 2  (two) times daily. 60 capsule 0  . Cyanocobalamin 1000 MCG SUBL Take 1 tablet (1,000 mcg total) by mouth daily. 90 each 0  . diphenhydrAMINE (BENADRYL) 25 MG tablet Take 1 tablet (25 mg total) by mouth every 6 (six) hours as needed for allergies. 100 tablet 3  . gabapentin (NEURONTIN) 300 MG capsule TAKE 1 TO 2 CAPSULES AT BEDTIME FOR NERVE PAIN 270 capsule 0  . GOODSENSE ASPIRIN 325 MG tablet TAKE 1 TABLET BY MOUTH TWICE DAILY 100 tablet 3  . Iron-Vitamins (GERITOL COMPLETE PO) Take 1 tablet by mouth daily.    Marland Kitchen. levothyroxine (SYNTHROID, LEVOTHROID) 100 MCG tablet Take 1 tablet (100 mcg total) by mouth daily before breakfast. 90 tablet 0  . loratadine (CLARITIN) 10 MG tablet Take 1 tablet (10 mg total) by mouth daily. 90 tablet 0  . metFORMIN (GLUCOPHAGE) 500 MG tablet Take 1 tablet (500 mg total) by mouth daily with breakfast. 30 tablet 0  . pantoprazole (PROTONIX) 40 MG tablet Take 1 tablet (40 mg total) by mouth 2 (two) times daily. 60 tablet 11  . ranitidine (ZANTAC) 150 MG tablet Take 1 tablet (150 mg total) by mouth daily. 90 tablet 0  . traMADol (ULTRAM) 50 MG tablet Take 1 tablet (50 mg total) by mouth 2 (two) times daily. 60 tablet 3   No current facility-administered medications on file prior to visit.     PAST MEDICAL HISTORY: Past  Medical History:  Diagnosis Date  . Ankle pain   . Anxiety   . Asthma   . Back pain   . Chest pain   . Chronic fatigue syndrome   . Constipation   . COPD (chronic obstructive pulmonary disease) (HCC)   . Depression   . Discoid lupus   . DVT (deep venous thrombosis) (HCC)   . Dyspnea   . Family history of breast cancer   . Family history of colon cancer   . GERD (gastroesophageal reflux disease)   . Hot flashes   . Hypothyroidism   . Insomnia   . Leg edema   . Lupus   . Neuropathy   . Obesity   . Sciatica   . Sleep apnea     PAST SURGICAL HISTORY: Past Surgical History:  Procedure Laterality Date  . ABDOMINAL HYSTERECTOMY    .  APPENDECTOMY    . KNEE ARTHROSCOPY W/ MENISCAL REPAIR    . TUBAL LIGATION  1987  . VEIN LIGATION AND STRIPPING      SOCIAL HISTORY: Social History   Tobacco Use  . Smoking status: Current Every Day Smoker    Packs/day: 1.00    Types: Cigarettes  . Smokeless tobacco: Never Used  Substance Use Topics  . Alcohol use: No    Alcohol/week: 0.0 oz  . Drug use: No    FAMILY HISTORY: Family History  Problem Relation Age of Onset  . Breast cancer Mother 35       bilateral breast cancer  . Hodgkin's lymphoma Father        dx in his 53s  . Lung cancer Father 21  . Lung cancer Sister 72       adenocarcinoma (also a smoker)  . Lupus Maternal Uncle   . Colon cancer Maternal Uncle        dx in his 77s  . Cancer Paternal Uncle        2 paternal uncles with cancer NOS  . Diabetes Maternal Grandmother   . Colon cancer Maternal Grandfather        late 44s  . Diabetes Paternal Grandmother   . Colon cancer Paternal Grandfather 63  . Breast cancer Sister 12  . Cancer Other   . Diabetes Other   . Mental illness Other     ROS: Review of Systems  Constitutional: Positive for weight loss.  Gastrointestinal: Negative for nausea and vomiting.  Musculoskeletal:       Negative muscle weakness  Endo/Heme/Allergies:       Negative hypoglycemia    PHYSICAL EXAM: Blood pressure 121/79, pulse 96, temperature 98 F (36.7 C), temperature source Oral, height 5\' 4"  (1.626 m), weight 245 lb (111.1 kg), SpO2 95 %. Body mass index is 42.05 kg/m. Physical Exam  Constitutional: She is oriented to person, place, and time. She appears well-developed and well-nourished.  Cardiovascular: Normal rate.  Pulmonary/Chest: Effort normal.  Musculoskeletal: Normal range of motion.  Neurological: She is oriented to person, place, and time.  Skin: Skin is warm and dry.  Psychiatric: She has a normal mood and affect. Her behavior is normal.  Vitals reviewed.   RECENT LABS AND TESTS: BMET    Component  Value Date/Time   NA 141 04/11/2017 1040   K 4.5 04/11/2017 1040   CL 104 04/11/2017 1040   CO2 22 04/11/2017 1040   GLUCOSE 95 04/11/2017 1040   GLUCOSE 112 (H) 12/02/2016 1756   BUN 15 04/11/2017 1040   CREATININE 0.57 04/11/2017  1040   CALCIUM 9.3 04/11/2017 1040   GFRNONAA 107 04/11/2017 1040   GFRAA 123 04/11/2017 1040   Lab Results  Component Value Date   HGBA1C 6.0 (H) 04/11/2017   Lab Results  Component Value Date   INSULIN 23.7 04/11/2017   CBC    Component Value Date/Time   WBC 7.4 04/11/2017 1040   WBC 6.0 12/02/2016 1756   RBC 4.51 04/11/2017 1040   RBC 4.20 12/02/2016 1756   HGB 13.9 04/11/2017 1040   HGB 14.4 12/25/2010 1504   HCT 41.6 04/11/2017 1040   HCT 41.5 12/25/2010 1504   PLT 316 12/02/2016 1756   PLT 329 12/25/2010 1504   MCV 92 04/11/2017 1040   MCV 94.7 12/25/2010 1504   MCH 30.8 04/11/2017 1040   MCH 31.7 12/02/2016 1756   MCHC 33.4 04/11/2017 1040   MCHC 33.3 12/02/2016 1756   RDW 13.6 04/11/2017 1040   RDW 13.0 12/25/2010 1504   LYMPHSABS 1.2 04/11/2017 1040   LYMPHSABS 1.7 12/25/2010 1504   MONOABS 0.5 12/02/2016 1756   MONOABS 0.8 12/25/2010 1504   EOSABS 0.2 04/11/2017 1040   BASOSABS 0.0 04/11/2017 1040   BASOSABS 0.0 12/25/2010 1504   Iron/TIBC/Ferritin/ %Sat No results found for: IRON, TIBC, FERRITIN, IRONPCTSAT Lipid Panel     Component Value Date/Time   CHOL 184 04/11/2017 1040   TRIG 139 04/11/2017 1040   HDL 54 04/11/2017 1040   CHOLHDL 3 03/26/2016 1704   VLDL 23.0 03/26/2016 1704   LDLCALC 102 (H) 04/11/2017 1040   Hepatic Function Panel     Component Value Date/Time   PROT 6.7 04/11/2017 1040   ALBUMIN 4.2 04/11/2017 1040   AST 24 04/11/2017 1040   ALT 25 04/11/2017 1040   ALKPHOS 90 04/11/2017 1040   BILITOT <0.2 04/11/2017 1040   BILIDIR 0.0 03/26/2016 1704      Component Value Date/Time   TSH 0.399 (L) 04/11/2017 1040   TSH 0.58 06/18/2016 1153   TSH 0.33 (L) 03/26/2016 1704    ASSESSMENT AND  PLAN: Vitamin D deficiency - Plan: calcitRIOL (ROCALTROL) 0.25 MCG capsule  Prediabetes - Plan: metFORMIN (GLUCOPHAGE) 500 MG tablet  Class 3 severe obesity with serious comorbidity and body mass index (BMI) of 40.0 to 44.9 in adult, unspecified obesity type (HCC)  PLAN:  Vitamin D Deficiency Tiffany Medina was informed that low vitamin D levels contributes to fatigue and are associated with obesity, breast, and colon cancer. She agrees to continue to take prescription Calcitrol 0.25 mcg by mouth 2 times daily #60 with no refills. We will recheck labs in 1 month and will follow up for routine testing of vitamin D, at least 2-3 times per year. She was informed of the risk of over-replacement of vitamin D and agrees to not increase her dose unless he discusses this with [place] first. Tiffany Medina agrees to follow up with our clinic in 3 weeks.  Pre-Diabetes Tiffany Medina will continue to work on weight loss, exercise, and decreasing simple carbohydrates in her diet to help decrease the risk of diabetes. We dicussed metformin including benefits and risks. She was informed that eating too many simple carbohydrates or too many calories at one sitting increases the likelihood of GI side effects. Tiffany Medina requested metformin for now and a prescription was written today for 1 month refill. We will recheck labs in 1 month and Tiffany Medina agreed to follow up with [place] as directed to monitor her progress.  Obesity Tiffany Medina is currently in the action stage of change.  As such, her goal is to continue with weight loss efforts She has agreed to follow the Category 2 plan either or follow the Pescatarian eating plan Tiffany Medina has been instructed to work up to a goal of 150 minutes of combined cardio and strengthening exercise per week for weight loss and overall health benefits. We discussed the following Behavioral Modification Strategies today: travel eating strategies, celebration eating strategies and work on meal planning and easy  cooking plans  Tiffany Medina has agreed to follow up with our clinic in 3 weeks. She was informed of the importance of frequent follow up visits to maximize her success with intensive lifestyle modifications for her multiple health conditions.  I, [place] CraneJoanne Murray, am acting as transcriptionist for Quillian Quincearen Khamron Gellert, MD  I have reviewed the above documentation for accuracy and completeness, and I agree with the above. -Quillian Quincearen Jaran Sainz, MD    OBESITY BEHAVIORAL INTERVENTION VISIT  Today's visit was # 3 out of 22.  Starting weight: 262 lbs Starting date: 04/11/17 Today's weight : 245 lbs  Today's date: 05/23/2017 Total lbs lost to date: 17 (Patients must lose 7 lbs in the first 6 months to continue with counseling)   ASK: We discussed the diagnosis of obesity with Tiffany Medina today and Tiffany Medina agreed to give us permission to discuss obesity behavioral modification therapy today.  ASSESS: Tiffany Medina has the diagnosis of obesity and her BMI today is 42.03 Tiffany Medina is in the action stage of change   ADVISE: Tiffany Medina was educated on the multiple health risks of obesity as well as the benefit of weight loss to improve her health. She was advised of the need for long term treatment and the importance of lifestyle modifications.  AGREE: Multiple dietary modification options and treatment options were discussed and  Tiffany Medina agreed to follow the Category 2 plan either or follow the Pescatarian eating plan We discussed the following Behavioral Modification Strategies today: travel eating strategies, celebration eating strategies and work on meal planning and easy cooking plans

## 2017-06-06 ENCOUNTER — Ambulatory Visit: Payer: BLUE CROSS/BLUE SHIELD | Admitting: Internal Medicine

## 2017-06-06 ENCOUNTER — Encounter: Payer: Self-pay | Admitting: Internal Medicine

## 2017-06-06 DIAGNOSIS — F988 Other specified behavioral and emotional disorders with onset usually occurring in childhood and adolescence: Secondary | ICD-10-CM | POA: Diagnosis not present

## 2017-06-06 DIAGNOSIS — E038 Other specified hypothyroidism: Secondary | ICD-10-CM

## 2017-06-06 DIAGNOSIS — R5383 Other fatigue: Secondary | ICD-10-CM | POA: Diagnosis not present

## 2017-06-06 DIAGNOSIS — E559 Vitamin D deficiency, unspecified: Secondary | ICD-10-CM | POA: Diagnosis not present

## 2017-06-06 MED ORDER — GABAPENTIN 300 MG PO CAPS: ORAL_CAPSULE | ORAL | 3 refills | Status: DC

## 2017-06-06 MED ORDER — RANITIDINE HCL 150 MG PO TABS: 150.0000 mg | ORAL_TABLET | Freq: Every day | ORAL | 3 refills | Status: DC

## 2017-06-06 MED ORDER — ASPIRIN 325 MG PO TABS: 325.0000 mg | ORAL_TABLET | Freq: Two times a day (BID) | ORAL | 3 refills | Status: DC

## 2017-06-06 MED ORDER — ALPRAZOLAM 0.5 MG PO TABS: ORAL_TABLET | ORAL | 2 refills | Status: DC

## 2017-06-06 MED ORDER — CYANOCOBALAMIN 1000 MCG SL SUBL: 1.0000 | SUBLINGUAL_TABLET | Freq: Every day | SUBLINGUAL | 3 refills | Status: DC

## 2017-06-06 MED ORDER — ARMODAFINIL 200 MG PO TABS: 1.0000 | ORAL_TABLET | Freq: Every day | ORAL | 1 refills | Status: DC

## 2017-06-06 MED ORDER — LEVOTHYROXINE SODIUM 100 MCG PO TABS: 100.0000 ug | ORAL_TABLET | Freq: Every day | ORAL | 3 refills | Status: DC

## 2017-06-06 MED ORDER — TRAMADOL HCL 50 MG PO TABS: 50.0000 mg | ORAL_TABLET | Freq: Two times a day (BID) | ORAL | 1 refills | Status: DC

## 2017-06-06 MED ORDER — BLACK COHOSH 540 MG PO CAPS: ORAL_CAPSULE | ORAL | 3 refills | Status: DC

## 2017-06-06 NOTE — Progress Notes (Signed)
Subjective:  Patient ID: Tiffany Medina, female    DOB: 31-Jul-1965  Age: 51 y.o. MRN: [id number]  CC: No chief complaint on file.   HPI Tiffany Medina presents for pain and anxiety, sleep disorder, obesity f/u  Outpatient Medications Prior to Visit  Medication Sig Dispense Refill  . Albuterol Sulfate (PROAIR RESPICLICK) 108 (90 Base) MCG/ACT AEPB Inhale 1-2 puffs into the lungs 4 (four) times daily as needed. 3 each 0  . ALPRAZolam (XANAX) 0.5 MG tablet TAKE 1 OR 2 TABLETS BY MOUTH TWICE DAILY AS NEEDED FOR insomnia Take one in a daytime if needed for anxiety 90 tablet 2  . Armodafinil 200 MG TABS Take 1 tablet daily by mouth. 30 tablet 5  . Black Cohosh 540 MG CAPS 1 po qhs 90 capsule 0  . calcitRIOL (ROCALTROL) 0.25 MCG capsule Take 1 capsule (0.25 mcg total) 2 (two) times daily by mouth. 60 capsule 0  . Cyanocobalamin 1000 MCG SUBL Take 1 tablet (1,000 mcg total) by mouth daily. 90 each 0  . diphenhydrAMINE (BENADRYL) 25 MG tablet Take 1 tablet (25 mg total) by mouth every 6 (six) hours as needed for allergies. 100 tablet 3  . gabapentin (NEURONTIN) 300 MG capsule TAKE 1 TO 2 CAPSULES AT BEDTIME FOR NERVE PAIN 270 capsule 0  . GOODSENSE ASPIRIN 325 MG tablet TAKE 1 TABLET BY MOUTH TWICE DAILY 100 tablet 3  . levothyroxine (SYNTHROID, LEVOTHROID) 100 MCG tablet Take 1 tablet (100 mcg total) by mouth daily before breakfast. 90 tablet 0  . loratadine (CLARITIN) 10 MG tablet Take 1 tablet (10 mg total) by mouth daily. 90 tablet 0  . metFORMIN (GLUCOPHAGE) 500 MG tablet Take 1 tablet (500 mg total) daily with breakfast by mouth. 30 tablet 0  . pantoprazole (PROTONIX) 40 MG tablet Take 1 tablet (40 mg total) by mouth 2 (two) times daily. 60 tablet 11  . ranitidine (ZANTAC) 150 MG tablet Take 1 tablet (150 mg total) by mouth daily. 90 tablet 0  . traMADol (ULTRAM) 50 MG tablet Take 1 tablet (50 mg total) by mouth 2 (two) times daily. 60 tablet 3  . Iron-Vitamins (GERITOL COMPLETE PO) Take 1  tablet by mouth daily.     No facility-administered medications prior to visit.     ROS Review of Systems  Constitutional: Positive for fatigue. Negative for activity change, appetite change, chills and unexpected weight change.  HENT: Negative for congestion, mouth sores and sinus pressure.   Eyes: Negative for visual disturbance.  Respiratory: Negative for cough and chest tightness.   Gastrointestinal: Negative for abdominal pain and nausea.  Genitourinary: Negative for difficulty urinating, frequency and vaginal pain.  Musculoskeletal: Positive for arthralgias and back pain. Negative for gait problem.  Skin: Negative for pallor and rash.  Neurological: Negative for dizziness, tremors, weakness, numbness and headaches.  Psychiatric/Behavioral: Negative for confusion and sleep disturbance.    Objective:  BP 122/84 (BP Location: Left Arm, Patient Position: Sitting, Cuff Size: Large)   Pulse 96   Temp 97.8 F (36.6 C) (Oral)   Ht 5\' 4"  (1.626 m)   Wt 249 lb (112.9 kg)   SpO2 96%   BMI 42.74 kg/m   BP Readings from Last 3 Encounters:  06/06/17 122/84  05/23/17 121/79  04/25/17 117/80    Wt Readings from Last 3 Encounters:  06/06/17 249 lb (112.9 kg)  05/23/17 245 lb (111.1 kg)  04/25/17 252 lb (114.3 kg)    Physical Exam  Constitutional: She appears well-developed. No  distress.  HENT:  Head: Normocephalic.  Right Ear: External ear normal.  Left Ear: External ear normal.  Nose: Nose normal.  Mouth/Throat: Oropharynx is clear and moist.  Eyes: Conjunctivae are normal. Pupils are equal, round, and reactive to light. Right eye exhibits no discharge. Left eye exhibits no discharge.  Neck: Normal range of motion. Neck supple. No JVD present. No tracheal deviation present. No thyromegaly present.  Cardiovascular: Normal rate, regular rhythm and normal heart sounds.  Pulmonary/Chest: No stridor. No respiratory distress. She has no wheezes.  Abdominal: Soft. Bowel sounds  are normal. She exhibits no distension and no mass. There is no tenderness. There is no rebound and no guarding.  Musculoskeletal: She exhibits tenderness. She exhibits no edema.  Lymphadenopathy:    She has no cervical adenopathy.  Neurological: She displays normal reflexes. No cranial nerve deficit. She exhibits normal muscle tone. Coordination normal.  Skin: No rash noted. No erythema.  Psychiatric: She has a normal mood and affect. Her behavior is normal. Judgment and thought content normal.    Lab Results  Component Value Date   WBC 7.4 04/11/2017   HGB 13.9 04/11/2017   HCT 41.6 04/11/2017   PLT 316 12/02/2016   GLUCOSE 95 04/11/2017   CHOL 184 04/11/2017   TRIG 139 04/11/2017   HDL 54 04/11/2017   LDLCALC 102 (H) 04/11/2017   ALT 25 04/11/2017   AST 24 04/11/2017   NA 141 04/11/2017   K 4.5 04/11/2017   CL 104 04/11/2017   CREATININE 0.57 04/11/2017   BUN 15 04/11/2017   CO2 22 04/11/2017   TSH 0.399 (L) 04/11/2017   INR 1.02 11/28/2014   HGBA1C 6.0 (H) 04/11/2017    US Venous Img Lower Unilateral Left  Result Date: 12/02/2016 CLINICAL DATA:  Initial evaluation for acute left leg swelling. EXAM: Left LOWER EXTREMITY VENOUS DOPPLER ULTRASOUND TECHNIQUE: Gray-scale sonography with graded compression, as well as color Doppler and duplex ultrasound were performed to evaluate the lower extremity deep venous systems from the level of the common femoral vein and including the common femoral, femoral, profunda femoral, popliteal and calf veins including the posterior tibial, peroneal and gastrocnemius veins when visible. The superficial great saphenous vein was also interrogated. Spectral Doppler was utilized to evaluate flow at rest and with distal augmentation maneuvers in the common femoral, femoral and popliteal veins. COMPARISON:  None. FINDINGS: Contralateral Common Femoral Vein: Respiratory phasicity is normal and symmetric with the symptomatic side. No evidence of thrombus.  Normal compressibility. Common Femoral Vein: No evidence of thrombus. Normal compressibility, respiratory phasicity and response to augmentation. Saphenofemoral Junction: No evidence of thrombus. Normal compressibility and flow on color Doppler imaging. Profunda Femoral Vein: No evidence of thrombus. Normal compressibility and flow on color Doppler imaging. Femoral Vein: No evidence of thrombus. Normal compressibility, respiratory phasicity and response to augmentation. Popliteal Vein: No evidence of thrombus. Normal compressibility, respiratory phasicity and response to augmentation. Calf Veins: No evidence of thrombus. Normal compressibility and flow on color Doppler imaging. Superficial Great Saphenous Vein: No evidence of thrombus. Normal compressibility and flow on color Doppler imaging. Venous Reflux:  None. Other Findings:  None. IMPRESSION: No evidence of DVT within the left lower extremity. Electronically Signed   By: Rise Mu M.D.   On: 12/02/2016 17:32    Assessment & Plan:   There are no diagnoses linked to this encounter. I have discontinued [name] Ratti's Iron-Vitamins (GERITOL COMPLETE PO). I am also having her maintain her diphenhydrAMINE, GOODSENSE ASPIRIN, ranitidine, pantoprazole,  loratadine, levothyroxine, gabapentin, Cyanocobalamin, Black Cohosh, Albuterol Sulfate, ALPRAZolam, traMADol, Armodafinil, calcitRIOL, and metFORMIN.  No orders of the defined types were placed in this encounter.    Follow-up: No Follow-up on file.  Sonda PrimesAlex Plotnikov, MD

## 2017-06-13 ENCOUNTER — Ambulatory Visit (INDEPENDENT_AMBULATORY_CARE_PROVIDER_SITE_OTHER): Payer: BLUE CROSS/BLUE SHIELD | Admitting: Family Medicine

## 2017-06-13 NOTE — Assessment & Plan Note (Signed)
On Vit D 

## 2017-06-13 NOTE — Assessment & Plan Note (Signed)
Off adderall. 

## 2017-06-13 NOTE — Assessment & Plan Note (Signed)
Working days now

## 2017-06-13 NOTE — Assessment & Plan Note (Signed)
Levothroid 

## 2017-06-13 NOTE — Assessment & Plan Note (Signed)
Wt Readings from Last 3 Encounters:  06/06/17 249 lb (112.9 kg)  05/23/17 245 lb (111.1 kg)  04/25/17 252 lb (114.3 kg)

## 2017-06-14 ENCOUNTER — Ambulatory Visit (INDEPENDENT_AMBULATORY_CARE_PROVIDER_SITE_OTHER): Payer: BLUE CROSS/BLUE SHIELD | Admitting: Family Medicine

## 2017-06-20 ENCOUNTER — Ambulatory Visit (INDEPENDENT_AMBULATORY_CARE_PROVIDER_SITE_OTHER): Payer: BLUE CROSS/BLUE SHIELD | Admitting: Family Medicine

## 2017-06-20 VITALS — BP 120/88 | HR 108 | Temp 98.0°F | Ht 64.0 in | Wt 244.0 lb

## 2017-06-20 DIAGNOSIS — E559 Vitamin D deficiency, unspecified: Secondary | ICD-10-CM

## 2017-06-20 DIAGNOSIS — F419 Anxiety disorder, unspecified: Secondary | ICD-10-CM

## 2017-06-20 DIAGNOSIS — R7303 Prediabetes: Secondary | ICD-10-CM

## 2017-06-20 DIAGNOSIS — Z6841 Body Mass Index (BMI) 40.0 and over, adult: Secondary | ICD-10-CM

## 2017-06-20 MED ORDER — CALCITRIOL 0.25 MCG PO CAPS: 0.2500 ug | ORAL_CAPSULE | Freq: Two times a day (BID) | ORAL | 0 refills | Status: DC

## 2017-06-20 MED ORDER — METFORMIN HCL 500 MG PO TABS: 500.0000 mg | ORAL_TABLET | Freq: Every day | ORAL | 0 refills | Status: DC

## 2017-06-20 NOTE — Progress Notes (Signed)
Office: [phone]  /  Fax: [phone]   HPI:   Chief Complaint: OBESITY Tiffany Medina is here to discuss her progress with her obesity treatment plan. She is on the Category 2 plan either/or follow the Pescatarian eating plan and is following her eating plan approximately 80 % of the time. She states she is exercising leg lifts and arm lifts for 10 minutes 4 times per week. Tiffany Medina continues to do well with weight loss on the category 3 plan, but she has extra stress and temptations, especially at work. Her weight is 244 lb (110.7 kg) today and has had a weight loss of 1 pound over a period of 3 weeks since her last visit. She has lost 18 lbs since starting treatment with [place].  Vitamin D deficiency [name] has a diagnosis of vitamin D deficiency. [name] is on Calcitriol, she didn't tolerate ergocalciferol and she still notes fatigue but denies nausea, vomiting or muscle weakness.  Pre-Diabetes [name] has a diagnosis of pre-diabetes based on her elevated Hgb A1c and was informed this puts her at greater risk of developing diabetes. [name] is stable on metformin currently and she continues to work on diet and exercise to decrease risk of diabetes. She denies nausea or hypoglycemia.  Anxiety  [name] notes her mood has decreased and she has been feeling overwhelmed recently with work and family. She is starting to dread going to work. [name] struggles with emotional eating and using food for comfort to the extent that it is negatively impacting her health. She often snacks when she is not hungry. [name] sometimes feels she is out of control and then feels guilty that she made poor food choices. She has been working on behavior modification techniques to help reduce her emotional eating and has been somewhat successful. She shows no sign of suicidal or homicidal ideations.  ALLERGIES: Allergies  Allergen Reactions  . Dihydrotachysterol Hives, Itching and Rash  . Vitamin D Analogs  Hives, Itching and Rash    Flushing    MEDICATIONS: Current Outpatient Medications on File Prior to Visit  Medication Sig Dispense Refill  . Albuterol Sulfate (PROAIR RESPICLICK) 108 (90 Base) MCG/ACT AEPB Inhale 1-2 puffs into the lungs 4 (four) times daily as needed. 3 each 0  . ALPRAZolam (XANAX) 0.5 MG tablet TAKE 1 OR 2 TABLETS BY MOUTH TWICE DAILY AS NEEDED FOR insomnia, anxiety 90 tablet 2  . Armodafinil 200 MG TABS Take 1 tablet by mouth daily. 90 tablet 1  . aspirin (GOODSENSE ASPIRIN) 325 MG tablet Take 1 tablet (325 mg total) by mouth 2 (two) times daily. 100 tablet 3  . Black Cohosh 540 MG CAPS 1 po qhs 90 capsule 3  . calcitRIOL (ROCALTROL) 0.25 MCG capsule Take 1 capsule (0.25 mcg total) 2 (two) times daily by mouth. 60 capsule 0  . Cyanocobalamin 1000 MCG SUBL Take 1 tablet (1,000 mcg total) by mouth daily. 90 each 3  . diphenhydrAMINE (BENADRYL) 25 MG tablet Take 1 tablet (25 mg total) by mouth every 6 (six) hours as needed for allergies. 100 tablet 3  . gabapentin (NEURONTIN) 300 MG capsule TAKE 1 TO 2 CAPSULES AT BEDTIME FOR NERVE PAIN 270 capsule 3  . levothyroxine (SYNTHROID, LEVOTHROID) 100 MCG tablet Take 1 tablet (100 mcg total) by mouth daily before breakfast. 90 tablet 3  . loratadine (CLARITIN) 10 MG tablet Take 1 tablet (10 mg total) by mouth daily. 90 tablet 0  . metFORMIN (GLUCOPHAGE) 500 MG tablet Take 1 tablet (500 mg total)  daily with breakfast by mouth. 30 tablet 0  . pantoprazole (PROTONIX) 40 MG tablet Take 1 tablet (40 mg total) by mouth 2 (two) times daily. 60 tablet 11  . ranitidine (ZANTAC) 150 MG tablet Take 1 tablet (150 mg total) by mouth daily. 90 tablet 3  . traMADol (ULTRAM) 50 MG tablet Take 1 tablet (50 mg total) by mouth 2 (two) times daily. 180 tablet 1   No current facility-administered medications on file prior to visit.     PAST MEDICAL HISTORY: Past Medical History:  Diagnosis Date  . Ankle pain   . Anxiety   . Asthma   . Back pain    . Chest pain   . Chronic fatigue syndrome   . Constipation   . COPD (chronic obstructive pulmonary disease) (HCC)   . Depression   . Discoid lupus   . DVT (deep venous thrombosis) (HCC)   . Dyspnea   . Family history of breast cancer   . Family history of colon cancer   . GERD (gastroesophageal reflux disease)   . Hot flashes   . Hypothyroidism   . Insomnia   . Leg edema   . Lupus   . Neuropathy   . Obesity   . Sciatica   . Sleep apnea     PAST SURGICAL HISTORY: Past Surgical History:  Procedure Laterality Date  . ABDOMINAL HYSTERECTOMY    . APPENDECTOMY    . KNEE ARTHROSCOPY W/ MENISCAL REPAIR    . TUBAL LIGATION  1987  . VEIN LIGATION AND STRIPPING      SOCIAL HISTORY: Social History   Tobacco Use  . Smoking status: Current Every Day Smoker    Packs/day: 1.00    Types: Cigarettes  . Smokeless tobacco: Never Used  Substance Use Topics  . Alcohol use: No    Alcohol/week: 0.0 oz  . Drug use: No    FAMILY HISTORY: Family History  Problem Relation Age of Onset  . Breast cancer Mother [id number]       bilateral breast cancer  . Hodgkin's lymphoma Father        dx in his 6140s  . Lung cancer Father [id number]  . Lung cancer Sister 1639       adenocarcinoma (also a smoker)  . Lupus Maternal Uncle   . Colon cancer Maternal Uncle        dx in his 9350s  . Cancer Paternal Uncle        2 paternal uncles with cancer NOS  . Diabetes Maternal Grandmother   . Colon cancer Maternal Grandfather        late 4780s  . Diabetes Paternal Grandmother   . Colon cancer Paternal Grandfather [id number]  . Breast cancer Sister [id number]  . Cancer Other   . Diabetes Other   . Mental illness Other     ROS: Review of Systems  Constitutional: Positive for malaise/fatigue and weight loss.  Gastrointestinal: Negative for nausea and vomiting.  Musculoskeletal:       Negative muscle weakness  Endo/Heme/Allergies:       Negative hypoglycemia  Psychiatric/Behavioral: Negative for suicidal ideas. The patient is  nervous/anxious (anxiety).        Positive stress    PHYSICAL EXAM: Blood pressure 120/88, pulse (!) 108, temperature 98 F (36.7 C), temperature source Oral, height 5\' 4"  (1.626 m), weight 244 lb (110.7 kg), SpO2 97 %. Body mass index is 41.88 kg/m. Physical Exam  Constitutional: She is oriented to person, place, and time.  She appears well-developed and well-nourished.  Cardiovascular: Normal rate.  Pulmonary/Chest: Effort normal.  Musculoskeletal: Normal range of motion.  Neurological: She is oriented to person, place, and time.  Skin: Skin is warm and dry.  Vitals reviewed.   RECENT LABS AND TESTS: BMET    Component Value Date/Time   NA 141 04/11/2017 1040   K 4.5 04/11/2017 1040   CL 104 04/11/2017 1040   CO2 22 04/11/2017 1040   GLUCOSE 95 04/11/2017 1040   GLUCOSE 112 (H) 12/02/2016 1756   BUN 15 04/11/2017 1040   CREATININE 0.57 04/11/2017 1040   CALCIUM 9.3 04/11/2017 1040   GFRNONAA 107 04/11/2017 1040   GFRAA 123 04/11/2017 1040   Lab Results  Component Value Date   HGBA1C 6.0 (H) 04/11/2017   Lab Results  Component Value Date   INSULIN 23.7 04/11/2017   CBC    Component Value Date/Time   WBC 7.4 04/11/2017 1040   WBC 6.0 12/02/2016 1756   RBC 4.51 04/11/2017 1040   RBC 4.20 12/02/2016 1756   HGB 13.9 04/11/2017 1040   HGB 14.4 12/25/2010 1504   HCT 41.6 04/11/2017 1040   HCT 41.5 12/25/2010 1504   PLT 316 12/02/2016 1756   PLT 329 12/25/2010 1504   MCV 92 04/11/2017 1040   MCV 94.7 12/25/2010 1504   MCH 30.8 04/11/2017 1040   MCH 31.7 12/02/2016 1756   MCHC 33.4 04/11/2017 1040   MCHC 33.3 12/02/2016 1756   RDW 13.6 04/11/2017 1040   RDW 13.0 12/25/2010 1504   LYMPHSABS 1.2 04/11/2017 1040   LYMPHSABS 1.7 12/25/2010 1504   MONOABS 0.5 12/02/2016 1756   MONOABS 0.8 12/25/2010 1504   EOSABS 0.2 04/11/2017 1040   BASOSABS 0.0 04/11/2017 1040   BASOSABS 0.0 12/25/2010 1504   Iron/TIBC/Ferritin/ %Sat No results found for: IRON, TIBC,  FERRITIN, IRONPCTSAT Lipid Panel     Component Value Date/Time   CHOL 184 04/11/2017 1040   TRIG 139 04/11/2017 1040   HDL 54 04/11/2017 1040   CHOLHDL 3 03/26/2016 1704   VLDL 23.0 03/26/2016 1704   LDLCALC 102 (H) 04/11/2017 1040   Hepatic Function Panel     Component Value Date/Time   PROT 6.7 04/11/2017 1040   ALBUMIN 4.2 04/11/2017 1040   AST 24 04/11/2017 1040   ALT 25 04/11/2017 1040   ALKPHOS 90 04/11/2017 1040   BILITOT <0.2 04/11/2017 1040   BILIDIR 0.0 03/26/2016 1704      Component Value Date/Time   TSH 0.399 (L) 04/11/2017 1040   TSH 0.58 06/18/2016 1153   TSH 0.33 (L) 03/26/2016 1704    ASSESSMENT AND PLAN: Prediabetes - Plan: metFORMIN (GLUCOPHAGE) 500 MG tablet  Vitamin D deficiency - Plan: calcitRIOL (ROCALTROL) 0.25 MCG capsule  Anxiety  Class 3 severe obesity with serious comorbidity and body mass index (BMI) of 40.0 to 44.9 in adult, unspecified obesity type (HCC)  PLAN:  Vitamin D Deficiency Cala BradfordKimberly was informed that low vitamin D levels contributes to fatigue and are associated with obesity, breast, and colon cancer. She agrees to continue to continue Calcitriol 0.25 mcg bid #60 with no refills. We will recheck labs in 1 month and will follow up for routine testing of vitamin D, at least 2-3 times per year. She was informed of the risk of over-replacement of vitamin D and agrees to not increase her dose unless he discusses this with us first. Cala BradfordKimberly agrees to follow up with our clinic in 3 weeks.  Pre-Diabetes Cala BradfordKimberly will continue  to work on weight loss, exercise, and decreasing simple carbohydrates in her diet to help decrease the risk of diabetes. We dicussed metformin including benefits and risks. She was informed that eating too many simple carbohydrates or too many calories at one sitting increases the likelihood of GI side effects. Serene requested metformin for now and a prescription was written today for 1 month refill. We will recheck  labs in 1 month and Lynise agreed to follow up with [place] as directed to monitor her progress.  Anxiety  We discussed behavior modification techniques today to help Porcha decrease stress eating and manage anxiety before she feels overwhelmed. She has agreed to try these techniques and follow up as directed.  Obesity Desa is currently in the action stage of change. As such, her goal is to continue with weight loss efforts She has agreed to follow the Category 3 plan Alease has been instructed to work up to a goal of 150 minutes of combined cardio and strengthening exercise per week for weight loss and overall health benefits. We discussed the following Behavioral Modification Strategies today: dealing with family or coworker sabotage and travel eating strategies   Kadynce has agreed to follow up with our clinic in 3 weeks. She was informed of the importance of frequent follow up visits to maximize her success with intensive lifestyle modifications for her multiple health conditions.  I, [place] Crane, am acting as transcriptionist for Quillian Quince, MD  I have reviewed the above documentation for accuracy and completeness, and I agree with the above. -Quillian Quince, MD    OBESITY BEHAVIORAL INTERVENTION VISIT  Today's visit was # 4 out of 22.  Starting weight: 262 lbs Starting date: 04/11/17 Today's weight : 244 lbs Today's date: 06/20/2017 Total lbs lost to date: 6 (Patients must lose 7 lbs in the first 6 months to continue with counseling)   ASK: We discussed the diagnosis of obesity with [name] today and Evalene agreed to give [place] permission to discuss obesity behavioral modification therapy today.  ASSESS: Eriko has the diagnosis of obesity and her BMI today is 41.86 Beatris is in the action stage of change   ADVISE: Aryona was educated on the multiple health risks of obesity as well as the benefit of weight loss to improve her health. She was advised  of the need for long term treatment and the importance of lifestyle modifications.  AGREE: Multiple dietary modification options and treatment options were discussed and  Dayne agreed to follow the Category 3 plan We discussed the following Behavioral Modification Strategies today: dealing with family or coworker sabotage and travel eating strategies

## 2017-07-05 DIAGNOSIS — J029 Acute pharyngitis, unspecified: Secondary | ICD-10-CM | POA: Diagnosis not present

## 2017-07-05 DIAGNOSIS — R05 Cough: Secondary | ICD-10-CM | POA: Diagnosis not present

## 2017-07-05 DIAGNOSIS — R52 Pain, unspecified: Secondary | ICD-10-CM | POA: Diagnosis not present

## 2017-07-18 ENCOUNTER — Ambulatory Visit (INDEPENDENT_AMBULATORY_CARE_PROVIDER_SITE_OTHER): Payer: BLUE CROSS/BLUE SHIELD | Admitting: Family Medicine

## 2017-07-18 ENCOUNTER — Encounter (INDEPENDENT_AMBULATORY_CARE_PROVIDER_SITE_OTHER): Payer: Self-pay

## 2017-07-25 ENCOUNTER — Other Ambulatory Visit (INDEPENDENT_AMBULATORY_CARE_PROVIDER_SITE_OTHER): Payer: Self-pay

## 2017-07-25 ENCOUNTER — Telehealth (INDEPENDENT_AMBULATORY_CARE_PROVIDER_SITE_OTHER): Payer: Self-pay | Admitting: Family Medicine

## 2017-07-25 DIAGNOSIS — E559 Vitamin D deficiency, unspecified: Secondary | ICD-10-CM

## 2017-07-25 DIAGNOSIS — R7303 Prediabetes: Secondary | ICD-10-CM

## 2017-07-25 MED ORDER — METFORMIN HCL 500 MG PO TABS: 500.0000 mg | ORAL_TABLET | Freq: Every day | ORAL | 0 refills | Status: DC

## 2017-07-25 MED ORDER — CALCITRIOL 0.25 MCG PO CAPS: 0.2500 ug | ORAL_CAPSULE | Freq: Two times a day (BID) | ORAL | 0 refills | Status: DC

## 2017-07-25 NOTE — Telephone Encounter (Signed)
Prescription was sent in for a 2 week supply per Dr Dalbert GarnetBeasley. Kayvan Hoefling, CMA

## 2017-07-25 NOTE — Telephone Encounter (Signed)
Correction to note sent earlier on 07/25/17.  Patient's name is not [name].  Patient's name is Tiffany Medina.

## 2017-08-08 ENCOUNTER — Ambulatory Visit (INDEPENDENT_AMBULATORY_CARE_PROVIDER_SITE_OTHER): Payer: BLUE CROSS/BLUE SHIELD | Admitting: Family Medicine

## 2017-08-08 ENCOUNTER — Other Ambulatory Visit: Payer: Self-pay | Admitting: Internal Medicine

## 2017-08-08 VITALS — BP 116/76 | HR 89 | Temp 97.7°F | Ht 64.0 in | Wt 239.0 lb

## 2017-08-08 DIAGNOSIS — E559 Vitamin D deficiency, unspecified: Secondary | ICD-10-CM | POA: Diagnosis not present

## 2017-08-08 DIAGNOSIS — R7303 Prediabetes: Secondary | ICD-10-CM | POA: Diagnosis not present

## 2017-08-08 DIAGNOSIS — Z6841 Body Mass Index (BMI) 40.0 and over, adult: Secondary | ICD-10-CM | POA: Diagnosis not present

## 2017-08-08 DIAGNOSIS — Z9189 Other specified personal risk factors, not elsewhere classified: Secondary | ICD-10-CM

## 2017-08-08 MED ORDER — CALCITRIOL 0.25 MCG PO CAPS
0.2500 ug | ORAL_CAPSULE | Freq: Two times a day (BID) | ORAL | 0 refills | Status: DC
Start: 2017-08-08 — End: 2017-08-29

## 2017-08-08 MED ORDER — METFORMIN HCL 500 MG PO TABS: 500.0000 mg | ORAL_TABLET | Freq: Every day | ORAL | 0 refills | Status: DC

## 2017-08-08 NOTE — Progress Notes (Signed)
Office: [phone]  /  Fax: [phone]   HPI:   Chief Complaint: OBESITY Tiffany Medina is here to discuss her progress with her obesity treatment plan. She is on the Category 3 plan and is following her eating plan approximately 80 % of the time. She states she is walking for 15 minutes 3-4 times per week. Tiffany Medina did well over Christmas. She didn't let food touch significant portion control over the holidays. She is interested in other options for breakfast. She is missing bananas and getting bored with greek yogurt.  Her weight is 239 lb (108.4 kg) today and has had a weight loss of 5 pounds over a period of 7 weeks since her last visit. She has lost 23 lbs since starting treatment with [place].  Pre-Diabetes Tiffany Medina has a diagnosis of pre-diabetes based on her elevated Hgb A1c and was informed this puts her at greater risk of developing diabetes. Last A1c of 6.0. She is taking metformin currently and continues to work on diet and exercise to decrease risk of diabetes. She denies polyphagia, nausea, or hypoglycemia.  At risk for diabetes Tiffany Medina is at higher than average risk for developing diabetes due to her obesity and pre-diabetes. She currently denies polyuria or polydipsia.  Vitamin D deficiency Tiffany Medina has a diagnosis of vitamin D deficiency. She is on Calcitriol, she notes fatigue and denies nausea, vomiting or muscle weakness.  ALLERGIES: Allergies  Allergen Reactions  . Dihydrotachysterol Hives, Itching and Rash  . Vitamin D Analogs Hives, Itching and Rash    Flushing    MEDICATIONS: Current Outpatient Medications on File Prior to Visit  Medication Sig Dispense Refill  . Albuterol Sulfate (PROAIR RESPICLICK) 108 (90 Base) MCG/ACT AEPB Inhale 1-2 puffs into the lungs 4 (four) times daily as needed. 3 each 0  . ALPRAZolam (XANAX) 0.5 MG tablet TAKE 1 OR 2 TABLETS BY MOUTH TWICE DAILY AS NEEDED FOR insomnia, anxiety 90 tablet 2  . Armodafinil 200 MG TABS Take 1 tablet by  mouth daily. 90 tablet 1  . aspirin (GOODSENSE ASPIRIN) 325 MG tablet Take 1 tablet (325 mg total) by mouth 2 (two) times daily. 100 tablet 3  . Black Cohosh 540 MG CAPS 1 po qhs 90 capsule 3  . calcitRIOL (ROCALTROL) 0.25 MCG capsule Take 1 capsule (0.25 mcg total) by mouth 2 (two) times daily. 28 capsule 0  . Cyanocobalamin 1000 MCG SUBL Take 1 tablet (1,000 mcg total) by mouth daily. 90 each 3  . diphenhydrAMINE (BENADRYL) 25 MG tablet Take 1 tablet (25 mg total) by mouth every 6 (six) hours as needed for allergies. 100 tablet 3  . gabapentin (NEURONTIN) 300 MG capsule TAKE 1 TO 2 CAPSULES AT BEDTIME FOR NERVE PAIN 270 capsule 3  . levothyroxine (SYNTHROID, LEVOTHROID) 100 MCG tablet Take 1 tablet (100 mcg total) by mouth daily before breakfast. 90 tablet 3  . loratadine (CLARITIN) 10 MG tablet Take 1 tablet (10 mg total) by mouth daily. 90 tablet 0  . metFORMIN (GLUCOPHAGE) 500 MG tablet Take 1 tablet (500 mg total) by mouth daily with breakfast. 14 tablet 0  . pantoprazole (PROTONIX) 40 MG tablet Take 1 tablet (40 mg total) by mouth 2 (two) times daily. 60 tablet 11  . ranitidine (ZANTAC) 150 MG tablet Take 1 tablet (150 mg total) by mouth daily. 90 tablet 3  . traMADol (ULTRAM) 50 MG tablet Take 1 tablet (50 mg total) by mouth 2 (two) times daily. 180 tablet 1   No current facility-administered medications  on file prior to visit.     PAST MEDICAL HISTORY: Past Medical History:  Diagnosis Date  . Ankle pain   . Anxiety   . Asthma   . Back pain   . Chest pain   . Chronic fatigue syndrome   . Constipation   . COPD (chronic obstructive pulmonary disease) (HCC)   . Depression   . Discoid lupus   . DVT (deep venous thrombosis) (HCC)   . Dyspnea   . Family history of breast cancer   . Family history of colon cancer   . GERD (gastroesophageal reflux disease)   . Hot flashes   . Hypothyroidism   . Insomnia   . Leg edema   . Lupus   . Neuropathy   . Obesity   . Sciatica   .  Sleep apnea     PAST SURGICAL HISTORY: Past Surgical History:  Procedure Laterality Date  . ABDOMINAL HYSTERECTOMY    . APPENDECTOMY    . KNEE ARTHROSCOPY W/ MENISCAL REPAIR    . TUBAL LIGATION  1987  . VEIN LIGATION AND STRIPPING      SOCIAL HISTORY: Social History   Tobacco Use  . Smoking status: Current Every Day Smoker    Packs/day: 1.00    Types: Cigarettes  . Smokeless tobacco: Never Used  Substance Use Topics  . Alcohol use: No    Alcohol/week: 0.0 oz  . Drug use: No    FAMILY HISTORY: Family History  Problem Relation Age of Onset  . Breast cancer Mother 5045       bilateral breast cancer  . Hodgkin's lymphoma Father        dx in his 6040s  . Lung cancer Father 7774  . Lung cancer Sister 5039       adenocarcinoma (also a smoker)  . Lupus Maternal Uncle   . Colon cancer Maternal Uncle        dx in his 4550s  . Cancer Paternal Uncle        2 paternal uncles with cancer NOS  . Diabetes Maternal Grandmother   . Colon cancer Maternal Grandfather        late 1780s  . Diabetes Paternal Grandmother   . Colon cancer Paternal Grandfather [id number]  . Breast cancer Sister 5065  . Cancer Other   . Diabetes Other   . Mental illness Other     ROS: Review of Systems  Constitutional: Positive for malaise/fatigue and weight loss.  Gastrointestinal: Negative for nausea and vomiting.  Genitourinary: Negative for frequency.  Musculoskeletal:       Negative muscle weakness  Endo/Heme/Allergies: Negative for polydipsia.       Negative polyphagia Negative hypoglycemia    PHYSICAL EXAM: Blood pressure 116/76, pulse 89, temperature 97.7 F (36.5 C), temperature source Oral, height 5\' 4"  (1.626 m), weight 239 lb (108.4 kg), SpO2 97 %. Body mass index is 41.02 kg/m. Physical Exam  Constitutional: She is oriented to person, place, and time. She appears well-developed and well-nourished.  Cardiovascular: Normal rate.  Pulmonary/Chest: Effort normal.  Musculoskeletal: Normal range of  motion.  Neurological: She is oriented to person, place, and time.  Skin: Skin is warm and dry.  Psychiatric: She has a normal mood and affect. Her behavior is normal.  Vitals reviewed.   RECENT LABS AND TESTS: BMET    Component Value Date/Time   NA 141 04/11/2017 1040   K 4.5 04/11/2017 1040   CL 104 04/11/2017 1040   CO2 22 04/11/2017  1040   GLUCOSE 95 04/11/2017 1040   GLUCOSE 112 (H) 12/02/2016 1756   BUN 15 04/11/2017 1040   CREATININE 0.57 04/11/2017 1040   CALCIUM 9.3 04/11/2017 1040   GFRNONAA 107 04/11/2017 1040   GFRAA 123 04/11/2017 1040   Lab Results  Component Value Date   HGBA1C 6.0 (H) 04/11/2017   Lab Results  Component Value Date   INSULIN 23.7 04/11/2017   CBC    Component Value Date/Time   WBC 7.4 04/11/2017 1040   WBC 6.0 12/02/2016 1756   RBC 4.51 04/11/2017 1040   RBC 4.20 12/02/2016 1756   HGB 13.9 04/11/2017 1040   HGB 14.4 12/25/2010 1504   HCT 41.6 04/11/2017 1040   HCT 41.5 12/25/2010 1504   PLT 316 12/02/2016 1756   PLT 329 12/25/2010 1504   MCV 92 04/11/2017 1040   MCV 94.7 12/25/2010 1504   MCH 30.8 04/11/2017 1040   MCH 31.7 12/02/2016 1756   MCHC 33.4 04/11/2017 1040   MCHC 33.3 12/02/2016 1756   RDW 13.6 04/11/2017 1040   RDW 13.0 12/25/2010 1504   LYMPHSABS 1.2 04/11/2017 1040   LYMPHSABS 1.7 12/25/2010 1504   MONOABS 0.5 12/02/2016 1756   MONOABS 0.8 12/25/2010 1504   EOSABS 0.2 04/11/2017 1040   BASOSABS 0.0 04/11/2017 1040   BASOSABS 0.0 12/25/2010 1504   Iron/TIBC/Ferritin/ %Sat No results found for: IRON, TIBC, FERRITIN, IRONPCTSAT Lipid Panel     Component Value Date/Time   CHOL 184 04/11/2017 1040   TRIG 139 04/11/2017 1040   HDL 54 04/11/2017 1040   CHOLHDL 3 03/26/2016 1704   VLDL 23.0 03/26/2016 1704   LDLCALC 102 (H) 04/11/2017 1040   Hepatic Function Panel     Component Value Date/Time   PROT 6.7 04/11/2017 1040   ALBUMIN 4.2 04/11/2017 1040   AST 24 04/11/2017 1040   ALT 25 04/11/2017 1040    ALKPHOS 90 04/11/2017 1040   BILITOT <0.2 04/11/2017 1040   BILIDIR 0.0 03/26/2016 1704      Component Value Date/Time   TSH 0.399 (L) 04/11/2017 1040   TSH 0.58 06/18/2016 1153   TSH 0.33 (L) 03/26/2016 1704  Results for KORINA, TRETTER (MRN [id number]) as of 08/08/2017 09:05  Ref. Range 04/11/2017 10:40  Vitamin D, 25-Hydroxy Latest Ref Range: 30.0 - 100.0 ng/mL 21.2 (L)    ASSESSMENT AND PLAN: Prediabetes - Plan: Comprehensive metabolic panel, Hemoglobin A1c, Insulin, random, Lipid Panel With LDL/HDL Ratio, metFORMIN (GLUCOPHAGE) 500 MG tablet  Vitamin D deficiency - Plan: VITAMIN D 25 Hydroxy (Vit-D Deficiency, Fractures), TSH, calcitRIOL (ROCALTROL) 0.25 MCG capsule  At risk for diabetes mellitus  Class 3 severe obesity with serious comorbidity and body mass index (BMI) of 40.0 to 44.9 in adult, unspecified obesity type (HCC)  PLAN:  Pre-Diabetes Tiffany Medina will continue to work on weight loss, exercise, and decreasing simple carbohydrates in her diet to help decrease the risk of diabetes. We dicussed metformin including benefits and risks. She was informed that eating too many simple carbohydrates or too many calories at one sitting increases the likelihood of GI side effects. Tiffany Medina agrees to continue taking metformin 500 mg daily with breakfast #30 with no refills. W will check Hgb A1c and insulin and Tiffany Medina agrees to follow up with our clinic in 2 weeks as directed to monitor her progress.  Diabetes risk counselling Tiffany Medina was given extended (15 minutes) diabetes prevention counseling today. She is 52 y.o. female and has risk factors for diabetes including obesity and pre-diabetes.  We discussed intensive lifestyle modifications today with an emphasis on weight loss as well as increasing exercise and decreasing simple carbohydrates in her diet.  Vitamin D Deficiency Tiffany Medina was informed that low vitamin D levels contributes to fatigue and are associated with obesity, breast,  and colon cancer. Tiffany Medina agrees to continue taking Calcitriol 0.25 mcg PO BID #60 with no refills. She will follow up for routine testing of vitamin D, at least 2-3 times per year. She was informed of the risk of over-replacement of vitamin D and agrees to not increase her dose unless she discusses this with [place] first. Tiffany Medina agrees to follow up with our clinic in 2 weeks.  Obesity Tiffany Medina is currently in the action stage of change. As such, her goal is to continue with weight loss efforts She has agreed to follow the Category 3 plan + other breakfast options Tiffany Medina has been instructed to work up to a goal of 150 minutes of combined cardio and strengthening exercise per week for weight loss and overall health benefits. We discussed the following Behavioral Modification Strategies today: increasing lean protein intake and work on meal planning and easy cooking plans   Tiffany Medina has agreed to follow up with our clinic in 2 weeks. She was informed of the importance of frequent follow up visits to maximize her success with intensive lifestyle modifications for her multiple health conditions.   OBESITY BEHAVIORAL INTERVENTION VISIT  Today's visit was # 5 out of 22.  Starting weight: 262 lbs Starting date: 04/11/17 Today's weight : 239 lbs  Today's date: 08/08/2017 Total lbs lost to date: 23 (Patients must lose 7 lbs in the first 6 months to continue with counseling)   ASK: We discussed the diagnosis of obesity with [name] today and Jasimine agreed to give [place] permission to discuss obesity behavioral modification therapy today.  ASSESS: Tiffany Medina has the diagnosis of obesity and her BMI today is 11 Tiffany Medina is in the action stage of change   ADVISE: Tiffany Medina was educated on the multiple health risks of obesity as well as the benefit of weight loss to improve her health. She was advised of the need for long term treatment and the importance of lifestyle  modifications.  AGREE: Multiple dietary modification options and treatment options were discussed and  Tiffany Medina agreed to the above obesity treatment plan.  I, Burt Knack, am acting as transcriptionist for Tiffany Quince, MD  I have reviewed the above documentation for accuracy and completeness, and I agree with the above. -Tiffany Quince, MD

## 2017-08-09 LAB — LIPID PANEL WITH LDL/HDL RATIO
Cholesterol, Total: 164 mg/dL (ref 100–199)
HDL: 46 mg/dL (ref >39)
LDL Calculated: 97 mg/dL (ref 0–99)
LDl/HDL Ratio: 2.1 ratio (ref 0.0–3.2)
Triglycerides: 106 mg/dL (ref 0–149)
VLDL Cholesterol Cal: 21 mg/dL (ref 5–40)

## 2017-08-09 LAB — COMPREHENSIVE METABOLIC PANEL
ALK PHOS: 80 IU/L (ref 39–117)
ALT: 21 IU/L (ref 0–32)
AST: 16 IU/L (ref 0–40)
Albumin/Globulin Ratio: 2 (ref 1.2–2.2)
Albumin: 4.4 g/dL (ref 3.5–5.5)
BILIRUBIN TOTAL: 0.2 mg/dL (ref 0.0–1.2)
BUN/Creatinine Ratio: 24 — ABNORMAL HIGH (ref 9–23)
BUN: 17 mg/dL (ref 6–24)
CHLORIDE: 98 mmol/L (ref 96–106)
CO2: 26 mmol/L (ref 20–29)
CREATININE: 0.72 mg/dL (ref 0.57–1.00)
Calcium: 9.6 mg/dL (ref 8.7–10.2)
GFR calc Af Amer: 111 mL/min/{1.73_m2} (ref >59)
GFR calc non Af Amer: 97 mL/min/{1.73_m2} (ref >59)
GLOBULIN, TOTAL: 2.2 g/dL (ref 1.5–4.5)
Glucose: 87 mg/dL (ref 65–99)
Potassium: 5.2 mmol/L (ref 3.5–5.2)
SODIUM: 139 mmol/L (ref 134–144)
Total Protein: 6.6 g/dL (ref 6.0–8.5)

## 2017-08-09 LAB — HEMOGLOBIN A1C
ESTIMATED AVERAGE GLUCOSE: 117 mg/dL
HEMOGLOBIN A1C: 5.7 % — AB (ref 4.8–5.6)

## 2017-08-09 LAB — INSULIN, RANDOM: INSULIN: 11.5 u[IU]/mL (ref 2.6–24.9)

## 2017-08-09 LAB — VITAMIN D 25 HYDROXY (VIT D DEFICIENCY, FRACTURES): VIT D 25 HYDROXY: 28.5 ng/mL — AB (ref 30.0–100.0)

## 2017-08-09 LAB — TSH: TSH: 0.6 u[IU]/mL (ref 0.450–4.500)

## 2017-08-22 ENCOUNTER — Ambulatory Visit (INDEPENDENT_AMBULATORY_CARE_PROVIDER_SITE_OTHER): Payer: BLUE CROSS/BLUE SHIELD | Admitting: Family Medicine

## 2017-08-22 ENCOUNTER — Encounter (INDEPENDENT_AMBULATORY_CARE_PROVIDER_SITE_OTHER): Payer: Self-pay

## 2017-08-29 ENCOUNTER — Ambulatory Visit (INDEPENDENT_AMBULATORY_CARE_PROVIDER_SITE_OTHER): Payer: BLUE CROSS/BLUE SHIELD | Admitting: Family Medicine

## 2017-08-29 VITALS — BP 106/72 | HR 112 | Temp 98.1°F | Ht 64.0 in | Wt 234.0 lb

## 2017-08-29 DIAGNOSIS — Z6841 Body Mass Index (BMI) 40.0 and over, adult: Secondary | ICD-10-CM | POA: Diagnosis not present

## 2017-08-29 DIAGNOSIS — Z9189 Other specified personal risk factors, not elsewhere classified: Secondary | ICD-10-CM

## 2017-08-29 DIAGNOSIS — R7303 Prediabetes: Secondary | ICD-10-CM | POA: Diagnosis not present

## 2017-08-29 DIAGNOSIS — E559 Vitamin D deficiency, unspecified: Secondary | ICD-10-CM

## 2017-08-29 MED ORDER — METFORMIN HCL 500 MG PO TABS: 500.0000 mg | ORAL_TABLET | Freq: Every day | ORAL | 0 refills | Status: DC

## 2017-08-29 MED ORDER — CALCITRIOL 0.25 MCG PO CAPS
0.2500 ug | ORAL_CAPSULE | Freq: Two times a day (BID) | ORAL | 0 refills | Status: DC
Start: 2017-08-29 — End: 2017-10-11

## 2017-08-29 NOTE — Progress Notes (Signed)
Office: [phone]  /  Fax: [phone]   HPI:   Chief Complaint: OBESITY Tiffany Medina is here to discuss her progress with her obesity treatment plan. She is on the  follow the Category 3 plan with additional breakfast options and is following her eating plan approximately 80 % of the time. She states she is not exercising. Tiffany Medina is liking other breakfast options. She is interested in oatmeal as a snack option. She is eating penne pasta with sugar free sauce.  Her weight is 234 lb (106.1 kg) today and has had a weight loss of 5 pounds over a period of 3 weeks since her last visit. She has lost 28 lbs since starting treatment with [place].  Pre-Diabetes Tiffany Medina has a diagnosis of prediabetes based on her elevated HgA1c of 5.7 and insulin of 11.5 and was informed this puts her at greater risk of developing diabetes. She is taking metformin currently and continues to work on diet and exercise to decrease risk of diabetes. She denies nausea or hypoglycemia.  Vitamin D deficiency Tiffany Medina has a diagnosis of vitamin D deficiency. She is currently taking calcitriol and denies nausea, vomiting or muscle weakness.   Ref. Range 08/08/2017 08:44  Vitamin D, 25-Hydroxy Latest Ref Range: 30.0 - 100.0 ng/mL 28.5 (L)   At risk for diabetes Tiffany Medina is at higher than averagerisk for developing diabetes due to her obesity. She currently denies polyuria or polydipsia.  ALLERGIES: Allergies  Allergen Reactions  . Dihydrotachysterol Hives, Itching and Rash  . Vitamin D Analogs Hives, Itching and Rash    Flushing    MEDICATIONS: Current Outpatient Medications on File Prior to Visit  Medication Sig Dispense Refill  . Albuterol Sulfate (PROAIR RESPICLICK) 108 (90 Base) MCG/ACT AEPB Inhale 1-2 puffs into the lungs 4 (four) times daily as needed. 3 each 0  . ALPRAZolam (XANAX) 0.5 MG tablet TAKE 1 OR 2 TABLETS BY MOUTH TWICE DAILY AS NEEDED FOR insomnia, anxiety 90 tablet 2  . Armodafinil 200 MG TABS Take  1 tablet by mouth daily. 90 tablet 1  . aspirin (GOODSENSE ASPIRIN) 325 MG tablet Take 1 tablet (325 mg total) by mouth 2 (two) times daily. 100 tablet 3  . Black Cohosh 540 MG CAPS 1 po qhs 90 capsule 3  . Cyanocobalamin 1000 MCG SUBL Take 1 tablet (1,000 mcg total) by mouth daily. 90 each 3  . diphenhydrAMINE (BENADRYL) 25 MG tablet Take 1 tablet (25 mg total) by mouth every 6 (six) hours as needed for allergies. 100 tablet 3  . gabapentin (NEURONTIN) 300 MG capsule TAKE 1 TO 2 CAPSULES AT BEDTIME FOR NERVE PAIN 270 capsule 3  . levothyroxine (SYNTHROID, LEVOTHROID) 100 MCG tablet Take 1 tablet (100 mcg total) by mouth daily before breakfast. 90 tablet 3  . loratadine (CLARITIN) 10 MG tablet Take 1 tablet (10 mg total) by mouth daily. 90 tablet 1  . pantoprazole (PROTONIX) 40 MG tablet Take 1 tablet (40 mg total) by mouth 2 (two) times daily. 60 tablet 11  . ranitidine (ZANTAC) 150 MG tablet Take 1 tablet (150 mg total) by mouth daily. 90 tablet 3  . traMADol (ULTRAM) 50 MG tablet Take 1 tablet (50 mg total) by mouth 2 (two) times daily. 180 tablet 1   No current facility-administered medications on file prior to visit.     PAST MEDICAL HISTORY: Past Medical History:  Diagnosis Date  . Ankle pain   . Anxiety   . Asthma   . Back pain   .  Chest pain   . Chronic fatigue syndrome   . Constipation   . COPD (chronic obstructive pulmonary disease) (HCC)   . Depression   . Discoid lupus   . DVT (deep venous thrombosis) (HCC)   . Dyspnea   . Family history of breast cancer   . Family history of colon cancer   . GERD (gastroesophageal reflux disease)   . Hot flashes   . Hypothyroidism   . Insomnia   . Leg edema   . Lupus   . Neuropathy   . Obesity   . Sciatica   . Sleep apnea     PAST SURGICAL HISTORY: Past Surgical History:  Procedure Laterality Date  . ABDOMINAL HYSTERECTOMY    . APPENDECTOMY    . KNEE ARTHROSCOPY W/ MENISCAL REPAIR    . TUBAL LIGATION  1987  . VEIN  LIGATION AND STRIPPING      SOCIAL HISTORY: Social History   Tobacco Use  . Smoking status: Current Every Day Smoker    Packs/day: 1.00    Types: Cigarettes  . Smokeless tobacco: Never Used  Substance Use Topics  . Alcohol use: No    Alcohol/week: 0.0 oz  . Drug use: No    FAMILY HISTORY: Family History  Problem Relation Age of Onset  . Breast cancer Mother [id number]       bilateral breast cancer  . Hodgkin's lymphoma Father        dx in his 3840s  . Lung cancer Father 7474  . Lung cancer Sister 5239       adenocarcinoma (also a smoker)  . Lupus Maternal Uncle   . Colon cancer Maternal Uncle        dx in his 3550s  . Cancer Paternal Uncle        2 paternal uncles with cancer NOS  . Diabetes Maternal Grandmother   . Colon cancer Maternal Grandfather        late 6680s  . Diabetes Paternal Grandmother   . Colon cancer Paternal Grandfather [id number]  . Breast cancer Sister 465  . Cancer Other   . Diabetes Other   . Mental illness Other     ROS: Review of Systems  Constitutional: Positive for weight loss.  Gastrointestinal: Negative for nausea and vomiting.  Musculoskeletal:       Negative for muscle weakness  Endo/Heme/Allergies: Negative for polydipsia.       Negative for polyuria  Negative for hypoglycemia     PHYSICAL EXAM: Blood pressure 106/72, pulse (!) 112, temperature 98.1 F (36.7 C), temperature source Oral, height 5\' 4"  (1.626 m), weight 234 lb (106.1 kg), SpO2 95 %. Body mass index is 40.17 kg/m. Physical Exam  Constitutional: She is oriented to person, place, and time. She appears well-developed and well-nourished.  HENT:  Head: Normocephalic.  Eyes: EOM are normal.  Neck: Normal range of motion.  Cardiovascular: Normal rate.  Pulmonary/Chest: Effort normal.  Musculoskeletal: Normal range of motion.  Neurological: She is alert and oriented to person, place, and time.  Skin: Skin is warm and dry.  Psychiatric: She has a normal mood and affect. Her behavior is  normal.  Vitals reviewed.   RECENT LABS AND TESTS: BMET    Component Value Date/Time   NA 139 08/08/2017 0844   K 5.2 08/08/2017 0844   CL 98 08/08/2017 0844   CO2 26 08/08/2017 0844   GLUCOSE 87 08/08/2017 0844   GLUCOSE 112 (H) 12/02/2016 1756   BUN 17 08/08/2017 0844  CREATININE 0.72 08/08/2017 0844   CALCIUM 9.6 08/08/2017 0844   GFRNONAA 97 08/08/2017 0844   GFRAA 111 08/08/2017 0844   Lab Results  Component Value Date   HGBA1C 5.7 (H) 08/08/2017   HGBA1C 6.0 (H) 04/11/2017   Lab Results  Component Value Date   INSULIN 11.5 08/08/2017   INSULIN 23.7 04/11/2017   CBC    Component Value Date/Time   WBC 7.4 04/11/2017 1040   WBC 6.0 12/02/2016 1756   RBC 4.51 04/11/2017 1040   RBC 4.20 12/02/2016 1756   HGB 13.9 04/11/2017 1040   HGB 14.4 12/25/2010 1504   HCT 41.6 04/11/2017 1040   HCT 41.5 12/25/2010 1504   PLT 316 12/02/2016 1756   PLT 329 12/25/2010 1504   MCV 92 04/11/2017 1040   MCV 94.7 12/25/2010 1504   MCH 30.8 04/11/2017 1040   MCH 31.7 12/02/2016 1756   MCHC 33.4 04/11/2017 1040   MCHC 33.3 12/02/2016 1756   RDW 13.6 04/11/2017 1040   RDW 13.0 12/25/2010 1504   LYMPHSABS 1.2 04/11/2017 1040   LYMPHSABS 1.7 12/25/2010 1504   MONOABS 0.5 12/02/2016 1756   MONOABS 0.8 12/25/2010 1504   EOSABS 0.2 04/11/2017 1040   BASOSABS 0.0 04/11/2017 1040   BASOSABS 0.0 12/25/2010 1504   Iron/TIBC/Ferritin/ %Sat No results found for: IRON, TIBC, FERRITIN, IRONPCTSAT Lipid Panel     Component Value Date/Time   CHOL 164 08/08/2017 0844   TRIG 106 08/08/2017 0844   HDL 46 08/08/2017 0844   CHOLHDL 3 03/26/2016 1704   VLDL 23.0 03/26/2016 1704   LDLCALC 97 08/08/2017 0844   Hepatic Function Panel     Component Value Date/Time   PROT 6.6 08/08/2017 0844   ALBUMIN 4.4 08/08/2017 0844   AST 16 08/08/2017 0844   ALT 21 08/08/2017 0844   ALKPHOS 80 08/08/2017 0844   BILITOT 0.2 08/08/2017 0844   BILIDIR 0.0 03/26/2016 1704      Component Value  Date/Time   TSH 0.600 08/08/2017 0844   TSH 0.399 (L) 04/11/2017 1040   TSH 0.58 06/18/2016 1153     Ref. Range 08/08/2017 08:44  Vitamin D, 25-Hydroxy Latest Ref Range: 30.0 - 100.0 ng/mL 28.5 (L)   ASSESSMENT AND PLAN: Prediabetes - Plan: metFORMIN (GLUCOPHAGE) 500 MG tablet  Vitamin D deficiency - Plan: calcitRIOL (ROCALTROL) 0.25 MCG capsule  At risk for diabetes mellitus  Class 3 severe obesity with serious comorbidity and body mass index (BMI) of 40.0 to 44.9 in adult, unspecified obesity type (HCC)  PLAN: Pre-Diabetes Selda will continue to work on weight loss, exercise, and decreasing simple carbohydrates in her diet to help decrease the risk of diabetes. We dicussed metformin including benefits and risks. She was informed that eating too many simple carbohydrates or too many calories at one sitting increases the likelihood of GI side effects. [place] agrees to continue metformin 500 mg qd #30 with no refills. Riyah agreed to follow up with [place] as directed to monitor her progress.  Vitamin D Deficiency Kynsley was informed that low vitamin D levels contributes to fatigue and are associated with obesity, breast, and colon cancer. She agrees to continue to take prescription calcitriol 0.25 mcg BID #60 with no refills and will follow up for routine testing of vitamin D, at least 2-3 times per year. She was informed of the risk of over-replacement of vitamin D and agrees to not increase her dose unless she discusses this with [place] first. Kayelynn agreed to follow up in our  clinic in 2 weeks.   Diabetes risk counseling Dulce was given extended (15 minutes) diabetes prevention counseling today. She is 52 y.o. female and has risk factors for diabetes including obesity. We discussed intensive lifestyle modifications today with an emphasis on weight loss as well as increasing exercise and decreasing simple carbohydrates in her diet.  Obesity Dorrene is currently in the action stage  of change. As such, her goal is to continue with weight loss efforts She has agreed to follow the Category 3 plan with additional breakfast options.  Tomasa has been instructed to work up to a goal of 150 minutes of combined cardio and strengthening exercise per week for weight loss and overall health benefits. We discussed the following Behavioral Modification Stratagies today: increasing lean protein intake, work on meal planning and easy cooking plans, and better snacking choices.    Allisa has agreed to follow up with our clinic in 2 weeks. She was informed of the importance of frequent follow up visits to maximize her success with intensive lifestyle modifications for her multiple health conditions.   OBESITY BEHAVIORAL INTERVENTION VISIT  Today's visit was # 6 out of 22.  Starting weight: 262 lbs Starting date: 04/11/17 Today's weight : 234 lbs Today's date: 08/29/2017 Total lbs lost to date: 8 (Patients must lose 7 lbs in the first 6 months to continue with counseling)   ASK: We discussed the diagnosis of obesity with [name] today and Chevonne agreed to give [place] permission to discuss obesity behavioral modification therapy today.  ASSESS: Ineze has the diagnosis of obesity and her BMI today is 40.17 Dorris is in the action stage of change   ADVISE: Janith was educated on the multiple health risks of obesity as well as the benefit of weight loss to improve her health. She was advised of the need for long term treatment and the importance of lifestyle modifications.  AGREE: Multiple dietary modification options and treatment options were discussed and  Ceili agreed to the above obesity treatment plan.  I, Jeralene Peters, am acting as transcriptionist for Debbra Riding, MD  I have reviewed the above documentation for accuracy and completeness, and I agree with the above. - Debbra Riding, MD

## 2017-09-05 ENCOUNTER — Encounter: Payer: Self-pay | Admitting: Internal Medicine

## 2017-09-05 ENCOUNTER — Ambulatory Visit: Payer: BLUE CROSS/BLUE SHIELD | Admitting: Internal Medicine

## 2017-09-05 ENCOUNTER — Telehealth: Payer: Self-pay | Admitting: Internal Medicine

## 2017-09-05 DIAGNOSIS — Z716 Tobacco abuse counseling: Secondary | ICD-10-CM | POA: Diagnosis not present

## 2017-09-05 DIAGNOSIS — G9332 Myalgic encephalomyelitis/chronic fatigue syndrome: Secondary | ICD-10-CM

## 2017-09-05 DIAGNOSIS — G8929 Other chronic pain: Secondary | ICD-10-CM | POA: Diagnosis not present

## 2017-09-05 DIAGNOSIS — E559 Vitamin D deficiency, unspecified: Secondary | ICD-10-CM

## 2017-09-05 DIAGNOSIS — L930 Discoid lupus erythematosus: Secondary | ICD-10-CM | POA: Diagnosis not present

## 2017-09-05 DIAGNOSIS — R000 Tachycardia, unspecified: Secondary | ICD-10-CM | POA: Diagnosis not present

## 2017-09-05 DIAGNOSIS — R5382 Chronic fatigue, unspecified: Secondary | ICD-10-CM

## 2017-09-05 DIAGNOSIS — R7303 Prediabetes: Secondary | ICD-10-CM | POA: Diagnosis not present

## 2017-09-05 DIAGNOSIS — M5440 Lumbago with sciatica, unspecified side: Secondary | ICD-10-CM | POA: Diagnosis not present

## 2017-09-05 DIAGNOSIS — Z6841 Body Mass Index (BMI) 40.0 and over, adult: Secondary | ICD-10-CM

## 2017-09-05 MED ORDER — ALPRAZOLAM 0.5 MG PO TABS: ORAL_TABLET | ORAL | 2 refills | Status: DC

## 2017-09-05 MED ORDER — TRAMADOL HCL 50 MG PO TABS: 50.0000 mg | ORAL_TABLET | Freq: Two times a day (BID) | ORAL | 1 refills | Status: DC

## 2017-09-05 MED ORDER — ARMODAFINIL 200 MG PO TABS: 1.0000 | ORAL_TABLET | Freq: Every day | ORAL | 1 refills | Status: DC

## 2017-09-05 MED ORDER — BUPROPION HCL ER (XL) 150 MG PO TB24: 150.0000 mg | ORAL_TABLET | Freq: Every day | ORAL | 5 refills | Status: DC

## 2017-09-05 MED ORDER — PANTOPRAZOLE SODIUM 40 MG PO TBEC: 40.0000 mg | DELAYED_RELEASE_TABLET | Freq: Two times a day (BID) | ORAL | 11 refills | Status: DC

## 2017-09-05 NOTE — Telephone Encounter (Signed)
Ok to ref zantac x 12 mo thx

## 2017-09-05 NOTE — Assessment & Plan Note (Signed)
Much better off ADD meds

## 2017-09-05 NOTE — Assessment & Plan Note (Signed)
Tramadol prn ° Potential benefits of a long term opioids use as well as potential risks (i.e. addiction risk, apnea etc) and complications (i.e. Somnolence, constipation and others) were explained to the patient and were aknowledged. ° ° °

## 2017-09-05 NOTE — Assessment & Plan Note (Signed)
Off Plaquenil or MTX per dermatology to control rash - d/c'd

## 2017-09-05 NOTE — Assessment & Plan Note (Signed)
Metformin 

## 2017-09-05 NOTE — Assessment & Plan Note (Signed)
Metformin - Dr Dalbert GarnetBeasley

## 2017-09-05 NOTE — Telephone Encounter (Signed)
Pt came back by the front desk after her appt this morning and stated she forgot to ask for her Zantac refill.  Please send to her pharmacy.

## 2017-09-05 NOTE — Assessment & Plan Note (Signed)
Armodafinil

## 2017-09-05 NOTE — Progress Notes (Signed)
Subjective:  Patient ID: Tiffany Medina, female    DOB: Apr 06, 1966  Age: 52 y.o. MRN: [id number]  CC: No chief complaint on file.   HPI Tiffany Medina presents for anxiety, fatigue, GERD, arthritis f/u Cont to smoke - Wellbutrin helped in the past  Outpatient Medications Prior to Visit  Medication Sig Dispense Refill  . Albuterol Sulfate (PROAIR RESPICLICK) 108 (90 Base) MCG/ACT AEPB Inhale 1-2 puffs into the lungs 4 (four) times daily as needed. 3 each 0  . ALPRAZolam (XANAX) 0.5 MG tablet TAKE 1 OR 2 TABLETS BY MOUTH TWICE DAILY AS NEEDED FOR insomnia, anxiety 90 tablet 2  . Armodafinil 200 MG TABS Take 1 tablet by mouth daily. 90 tablet 1  . aspirin (GOODSENSE ASPIRIN) 325 MG tablet Take 1 tablet (325 mg total) by mouth 2 (two) times daily. 100 tablet 3  . Black Cohosh 540 MG CAPS 1 po qhs 90 capsule 3  . calcitRIOL (ROCALTROL) 0.25 MCG capsule Take 1 capsule (0.25 mcg total) by mouth 2 (two) times daily. 60 capsule 0  . Cyanocobalamin 1000 MCG SUBL Take 1 tablet (1,000 mcg total) by mouth daily. 90 each 3  . diphenhydrAMINE (BENADRYL) 25 MG tablet Take 1 tablet (25 mg total) by mouth every 6 (six) hours as needed for allergies. 100 tablet 3  . gabapentin (NEURONTIN) 300 MG capsule TAKE 1 TO 2 CAPSULES AT BEDTIME FOR NERVE PAIN 270 capsule 3  . levothyroxine (SYNTHROID, LEVOTHROID) 100 MCG tablet Take 1 tablet (100 mcg total) by mouth daily before breakfast. 90 tablet 3  . loratadine (CLARITIN) 10 MG tablet Take 1 tablet (10 mg total) by mouth daily. 90 tablet 1  . metFORMIN (GLUCOPHAGE) 500 MG tablet Take 1 tablet (500 mg total) by mouth daily with breakfast. 30 tablet 0  . pantoprazole (PROTONIX) 40 MG tablet Take 1 tablet (40 mg total) by mouth 2 (two) times daily. 60 tablet 11  . ranitidine (ZANTAC) 150 MG tablet Take 1 tablet (150 mg total) by mouth daily. 90 tablet 3  . traMADol (ULTRAM) 50 MG tablet Take 1 tablet (50 mg total) by mouth 2 (two) times daily. 180 tablet 1   No  facility-administered medications prior to visit.     ROS Review of Systems  Constitutional: Positive for fatigue. Negative for activity change, appetite change, chills and unexpected weight change.  HENT: Negative for congestion, mouth sores and sinus pressure.   Eyes: Negative for visual disturbance.  Respiratory: Negative for cough and chest tightness.   Gastrointestinal: Negative for abdominal pain and nausea.  Genitourinary: Negative for difficulty urinating, frequency and vaginal pain.  Musculoskeletal: Positive for arthralgias and back pain. Negative for gait problem.  Skin: Negative for pallor and rash.  Neurological: Negative for dizziness, tremors, weakness, numbness and headaches.  Psychiatric/Behavioral: Positive for dysphoric mood and sleep disturbance. Negative for confusion and suicidal ideas. The patient is nervous/anxious.     Objective:  BP 108/76 (BP Location: Left Arm, Patient Position: Sitting, Cuff Size: Large)   Pulse 91   Temp 98 F (36.7 C) (Oral)   Ht 5\' 4"  (1.626 m)   Wt 240 lb (108.9 kg)   SpO2 99%   BMI 41.20 kg/m   BP Readings from Last 3 Encounters:  09/05/17 108/76  08/29/17 106/72  08/08/17 116/76    Wt Readings from Last 3 Encounters:  09/05/17 240 lb (108.9 kg)  08/29/17 234 lb (106.1 kg)  08/08/17 239 lb (108.4 kg)    Physical Exam  Constitutional: She  appears well-developed. No distress.  HENT:  Head: Normocephalic.  Right Ear: External ear normal.  Left Ear: External ear normal.  Nose: Nose normal.  Mouth/Throat: Oropharynx is clear and moist.  Eyes: Conjunctivae are normal. Pupils are equal, round, and reactive to light. Right eye exhibits no discharge. Left eye exhibits no discharge.  Neck: Normal range of motion. Neck supple. No JVD present. No tracheal deviation present. No thyromegaly present.  Cardiovascular: Normal rate, regular rhythm and normal heart sounds.  Pulmonary/Chest: No stridor. No respiratory distress. She has  no wheezes.  Abdominal: Soft. Bowel sounds are normal. She exhibits no distension and no mass. There is no tenderness. There is no rebound and no guarding.  Musculoskeletal: She exhibits tenderness. She exhibits no edema.  Lymphadenopathy:    She has no cervical adenopathy.  Neurological: She displays normal reflexes. No cranial nerve deficit. She exhibits normal muscle tone. Coordination normal.  Skin: No rash noted. No erythema.  Psychiatric: She has a normal mood and affect. Her behavior is normal. Judgment and thought content normal.    Lab Results  Component Value Date   WBC 7.4 04/11/2017   HGB 13.9 04/11/2017   HCT 41.6 04/11/2017   PLT 316 12/02/2016   GLUCOSE 87 08/08/2017   CHOL 164 08/08/2017   TRIG 106 08/08/2017   HDL 46 08/08/2017   LDLCALC 97 08/08/2017   ALT 21 08/08/2017   AST 16 08/08/2017   NA 139 08/08/2017   K 5.2 08/08/2017   CL 98 08/08/2017   CREATININE 0.72 08/08/2017   BUN 17 08/08/2017   CO2 26 08/08/2017   TSH 0.600 08/08/2017   INR 1.02 11/28/2014   HGBA1C 5.7 (H) 08/08/2017    US Venous Img Lower Unilateral Left  Result Date: 12/02/2016 CLINICAL DATA:  Initial evaluation for acute left leg swelling. EXAM: Left LOWER EXTREMITY VENOUS DOPPLER ULTRASOUND TECHNIQUE: Gray-scale sonography with graded compression, as well as color Doppler and duplex ultrasound were performed to evaluate the lower extremity deep venous systems from the level of the common femoral vein and including the common femoral, femoral, profunda femoral, popliteal and calf veins including the posterior tibial, peroneal and gastrocnemius veins when visible. The superficial great saphenous vein was also interrogated. Spectral Doppler was utilized to evaluate flow at rest and with distal augmentation maneuvers in the common femoral, femoral and popliteal veins. COMPARISON:  None. FINDINGS: Contralateral Common Femoral Vein: Respiratory phasicity is normal and symmetric with the  symptomatic side. No evidence of thrombus. Normal compressibility. Common Femoral Vein: No evidence of thrombus. Normal compressibility, respiratory phasicity and response to augmentation. Saphenofemoral Junction: No evidence of thrombus. Normal compressibility and flow on color Doppler imaging. Profunda Femoral Vein: No evidence of thrombus. Normal compressibility and flow on color Doppler imaging. Femoral Vein: No evidence of thrombus. Normal compressibility, respiratory phasicity and response to augmentation. Popliteal Vein: No evidence of thrombus. Normal compressibility, respiratory phasicity and response to augmentation. Calf Veins: No evidence of thrombus. Normal compressibility and flow on color Doppler imaging. Superficial Great Saphenous Vein: No evidence of thrombus. Normal compressibility and flow on color Doppler imaging. Venous Reflux:  None. Other Findings:  None. IMPRESSION: No evidence of DVT within the left lower extremity. Electronically Signed   By: Rise Mu M.D.   On: 12/02/2016 17:32    Assessment & Plan:   There are no diagnoses linked to this encounter. I am having Tiffany Medina maintain her diphenhydrAMINE, pantoprazole, Albuterol Sulfate, levothyroxine, Cyanocobalamin, ranitidine, aspirin, Black Cohosh, gabapentin, traMADol, Armodafinil,  ALPRAZolam, loratadine, metFORMIN, and calcitRIOL.  No orders of the defined types were placed in this encounter.    Follow-up: No Follow-up on file.  Sonda PrimesAlex Plotnikov, MD

## 2017-09-05 NOTE — Telephone Encounter (Signed)
#   90 with 3 refills sent 06/2017, pt should have refills at pharmacy

## 2017-09-05 NOTE — Assessment & Plan Note (Signed)
Cont to smoke - Wellbutrin helped in the past

## 2017-09-05 NOTE — Assessment & Plan Note (Signed)
Vit D 

## 2017-09-12 ENCOUNTER — Ambulatory Visit (INDEPENDENT_AMBULATORY_CARE_PROVIDER_SITE_OTHER): Payer: BLUE CROSS/BLUE SHIELD | Admitting: Family Medicine

## 2017-09-12 VITALS — BP 119/78 | HR 99 | Temp 97.6°F | Ht 64.0 in | Wt 232.0 lb

## 2017-09-12 DIAGNOSIS — R7303 Prediabetes: Secondary | ICD-10-CM

## 2017-09-12 DIAGNOSIS — Z9189 Other specified personal risk factors, not elsewhere classified: Secondary | ICD-10-CM | POA: Diagnosis not present

## 2017-09-12 DIAGNOSIS — Z6839 Body mass index (BMI) 39.0-39.9, adult: Secondary | ICD-10-CM

## 2017-09-12 MED ORDER — METFORMIN HCL 500 MG PO TABS: 500.0000 mg | ORAL_TABLET | Freq: Every day | ORAL | 0 refills | Status: DC

## 2017-09-12 NOTE — Progress Notes (Signed)
Office: [phone]  /  Fax: [phone]   HPI:   Chief Complaint: OBESITY Sereen is here to discuss her progress with her obesity treatment plan. She is on the Category 3 plan with additional breakfast options and is following her eating plan approximately 80 % of the time. She states she is exercising 0 minutes 0 times per week. Lusia continues to do well with weight loss. She has lost more than 10 % of her total body weight in less than 6 months. Hunger is controlled, but she is getting bored with her category 3 plan. Her weight is 232 lb (105.2 kg) today and has had a weight loss of 2 pounds over a period of 2 weeks since her last visit. She has lost 30 lbs since starting treatment with [place].  Pre-Diabetes Krystalle has a diagnosis of pre-diabetes based on her elevated Hgb A1c and was informed this puts her at greater risk of developing diabetes. Her A1c is improving with diet, exercise and metformin. She continues to work on diet and exercise to decrease risk of diabetes. She denies nausea, vomiting or hypoglycemia.  At risk for diabetes Connie is at higher than average risk for developing diabetes due to her obesity and pre-diabetes. She currently denies polyuria or polydipsia.  ALLERGIES: Allergies  Allergen Reactions  . Dihydrotachysterol Hives, Itching and Rash  . Vitamin D Analogs Hives, Itching and Rash    Flushing    MEDICATIONS: Current Outpatient Medications on File Prior to Visit  Medication Sig Dispense Refill  . Albuterol Sulfate (PROAIR RESPICLICK) 108 (90 Base) MCG/ACT AEPB Inhale 1-2 puffs into the lungs 4 (four) times daily as needed. 3 each 0  . ALPRAZolam (XANAX) 0.5 MG tablet TAKE 1 OR 2 TABLETS BY MOUTH TWICE DAILY AS NEEDED FOR insomnia, anxiety 90 tablet 2  . Armodafinil 200 MG TABS Take 1 tablet by mouth daily. 90 tablet 1  . aspirin (GOODSENSE ASPIRIN) 325 MG tablet Take 1 tablet (325 mg total) by mouth 2 (two) times daily. 100 tablet 3  . Black  Cohosh 540 MG CAPS 1 po qhs 90 capsule 3  . buPROPion (WELLBUTRIN XL) 150 MG 24 hr tablet Take 1 tablet (150 mg total) by mouth daily. 30 tablet 5  . calcitRIOL (ROCALTROL) 0.25 MCG capsule Take 1 capsule (0.25 mcg total) by mouth 2 (two) times daily. 60 capsule 0  . Cyanocobalamin 1000 MCG SUBL Take 1 tablet (1,000 mcg total) by mouth daily. 90 each 3  . diphenhydrAMINE (BENADRYL) 25 MG tablet Take 1 tablet (25 mg total) by mouth every 6 (six) hours as needed for allergies. 100 tablet 3  . gabapentin (NEURONTIN) 300 MG capsule TAKE 1 TO 2 CAPSULES AT BEDTIME FOR NERVE PAIN 270 capsule 3  . levothyroxine (SYNTHROID, LEVOTHROID) 100 MCG tablet Take 1 tablet (100 mcg total) by mouth daily before breakfast. 90 tablet 3  . loratadine (CLARITIN) 10 MG tablet Take 1 tablet (10 mg total) by mouth daily. 90 tablet 1  . metFORMIN (GLUCOPHAGE) 500 MG tablet Take 1 tablet (500 mg total) by mouth daily with breakfast. 30 tablet 0  . pantoprazole (PROTONIX) 40 MG tablet Take 1 tablet (40 mg total) by mouth 2 (two) times daily. 60 tablet 11  . ranitidine (ZANTAC) 150 MG tablet Take 1 tablet (150 mg total) by mouth daily. 90 tablet 3  . traMADol (ULTRAM) 50 MG tablet Take 1 tablet (50 mg total) by mouth 2 (two) times daily. 180 tablet 1   No current  facility-administered medications on file prior to visit.     PAST MEDICAL HISTORY: Past Medical History:  Diagnosis Date  . Ankle pain   . Anxiety   . Asthma   . Back pain   . Chest pain   . Chronic fatigue syndrome   . Constipation   . COPD (chronic obstructive pulmonary disease) (HCC)   . Depression   . Discoid lupus   . DVT (deep venous thrombosis) (HCC)   . Dyspnea   . Family history of breast cancer   . Family history of colon cancer   . GERD (gastroesophageal reflux disease)   . Hot flashes   . Hypothyroidism   . Insomnia   . Leg edema   . Lupus   . Neuropathy   . Obesity   . Sciatica   . Sleep apnea     PAST SURGICAL HISTORY: Past  Surgical History:  Procedure Laterality Date  . ABDOMINAL HYSTERECTOMY    . APPENDECTOMY    . KNEE ARTHROSCOPY W/ MENISCAL REPAIR    . TUBAL LIGATION  1987  . VEIN LIGATION AND STRIPPING      SOCIAL HISTORY: Social History   Tobacco Use  . Smoking status: Current Every Day Smoker    Packs/day: 1.00    Types: Cigarettes  . Smokeless tobacco: Never Used  Substance Use Topics  . Alcohol use: No    Alcohol/week: 0.0 oz  . Drug use: No    FAMILY HISTORY: Family History  Problem Relation Age of Onset  . Breast cancer Mother 30       bilateral breast cancer  . Hodgkin's lymphoma Father        dx in his 52s  . Lung cancer Father 8  . Lung cancer Sister 65       adenocarcinoma (also a smoker)  . Lupus Maternal Uncle   . Colon cancer Maternal Uncle        dx in his 65s  . Cancer Paternal Uncle        2 paternal uncles with cancer NOS  . Diabetes Maternal Grandmother   . Colon cancer Maternal Grandfather        late 24s  . Diabetes Paternal Grandmother   . Colon cancer Paternal Grandfather 67  . Breast cancer Sister 30  . Cancer Other   . Diabetes Other   . Mental illness Other     ROS: Review of Systems  Constitutional: Positive for weight loss.  Gastrointestinal: Negative for nausea and vomiting.  Genitourinary: Negative for frequency.  Endo/Heme/Allergies: Negative for polydipsia.       Negative for hypoglycemia    PHYSICAL EXAM: Blood pressure 119/78, pulse 99, temperature 97.6 F (36.4 C), temperature source Oral, height 5\' 4"  (1.626 m), weight 232 lb (105.2 kg), SpO2 93 %. Body mass index is 39.82 kg/m. Physical Exam  Constitutional: She is oriented to person, place, and time. She appears well-developed and well-nourished.  Cardiovascular: Normal rate.  Pulmonary/Chest: Effort normal.  Musculoskeletal: Normal range of motion.  Neurological: She is oriented to person, place, and time.  Skin: Skin is warm and dry.  Psychiatric: She has a normal mood and  affect. Her behavior is normal.  Vitals reviewed.   RECENT LABS AND TESTS: BMET    Component Value Date/Time   NA 139 08/08/2017 0844   K 5.2 08/08/2017 0844   CL 98 08/08/2017 0844   CO2 26 08/08/2017 0844   GLUCOSE 87 08/08/2017 0844   GLUCOSE 112 (H)  12/02/2016 1756   BUN 17 08/08/2017 0844   CREATININE 0.72 08/08/2017 0844   CALCIUM 9.6 08/08/2017 0844   GFRNONAA 97 08/08/2017 0844   GFRAA 111 08/08/2017 0844   Lab Results  Component Value Date   HGBA1C 5.7 (H) 08/08/2017   HGBA1C 6.0 (H) 04/11/2017   Lab Results  Component Value Date   INSULIN 11.5 08/08/2017   INSULIN 23.7 04/11/2017   CBC    Component Value Date/Time   WBC 7.4 04/11/2017 1040   WBC 6.0 12/02/2016 1756   RBC 4.51 04/11/2017 1040   RBC 4.20 12/02/2016 1756   HGB 13.9 04/11/2017 1040   HGB 14.4 12/25/2010 1504   HCT 41.6 04/11/2017 1040   HCT 41.5 12/25/2010 1504   PLT 316 12/02/2016 1756   PLT 329 12/25/2010 1504   MCV 92 04/11/2017 1040   MCV 94.7 12/25/2010 1504   MCH 30.8 04/11/2017 1040   MCH 31.7 12/02/2016 1756   MCHC 33.4 04/11/2017 1040   MCHC 33.3 12/02/2016 1756   RDW 13.6 04/11/2017 1040   RDW 13.0 12/25/2010 1504   LYMPHSABS 1.2 04/11/2017 1040   LYMPHSABS 1.7 12/25/2010 1504   MONOABS 0.5 12/02/2016 1756   MONOABS 0.8 12/25/2010 1504   EOSABS 0.2 04/11/2017 1040   BASOSABS 0.0 04/11/2017 1040   BASOSABS 0.0 12/25/2010 1504   Iron/TIBC/Ferritin/ %Sat No results found for: IRON, TIBC, FERRITIN, IRONPCTSAT Lipid Panel     Component Value Date/Time   CHOL 164 08/08/2017 0844   TRIG 106 08/08/2017 0844   HDL 46 08/08/2017 0844   CHOLHDL 3 03/26/2016 1704   VLDL 23.0 03/26/2016 1704   LDLCALC 97 08/08/2017 0844   Hepatic Function Panel     Component Value Date/Time   PROT 6.6 08/08/2017 0844   ALBUMIN 4.4 08/08/2017 0844   AST 16 08/08/2017 0844   ALT 21 08/08/2017 0844   ALKPHOS 80 08/08/2017 0844   BILITOT 0.2 08/08/2017 0844   BILIDIR 0.0 03/26/2016 1704        Component Value Date/Time   TSH 0.600 08/08/2017 0844   TSH 0.399 (L) 04/11/2017 1040   TSH 0.58 06/18/2016 1153    ASSESSMENT AND PLAN: Prediabetes - Plan: metFORMIN (GLUCOPHAGE) 500 MG tablet  At risk for diabetes mellitus  Class 2 severe obesity with serious comorbidity and body mass index (BMI) of 39.0 to 39.9 in adult, unspecified obesity type (HCC)  PLAN:  Pre-Diabetes Cala BradfordKimberly will continue to work on weight loss, exercise, and decreasing simple carbohydrates in her diet to help decrease the risk of diabetes. We dicussed metformin including benefits and risks. She was informed that eating too many simple carbohydrates or too many calories at one sitting increases the likelihood of GI side effects. Cala BradfordKimberly agrees to continue metformin for now and a prescription was written today for 1 month refill. We will recheck labs in 2 months and Cala BradfordKimberly agreed to follow up with us as directed to monitor her progress.  Diabetes risk counseling Cala BradfordKimberly was given extended (15 minutes) diabetes prevention counseling today. She is 52 y.o. female and has risk factors for diabetes including obesity and pre-diabetes. We discussed intensive lifestyle modifications today with an emphasis on weight loss as well as increasing exercise and decreasing simple carbohydrates in her diet.  Obesity Cala BradfordKimberly is currently in the action stage of change. As such, her goal is to continue with weight loss efforts She has agreed to change to the Pescatarian eating plan plus 200 calories Cala BradfordKimberly has been instructed to  work up to a goal of 150 minutes of combined cardio and strengthening exercise per week for weight loss and overall health benefits. We discussed the following Behavioral Modification Strategies today: increasing lean protein intake, decreasing simple carbohydrates  and work on meal planning and easy cooking plans  [place] has agreed to follow up with our clinic in 3 weeks. She was informed of  the importance of frequent follow up visits to maximize her success with intensive lifestyle modifications for her multiple health conditions.   OBESITY BEHAVIORAL INTERVENTION VISIT  Today's visit was # 7 out of 22.  Starting weight: 262 lbs Starting date: 04/11/17 Today's weight : 232 lbs Today's date: 09/12/2017 Total lbs lost to date: 30 (Patients must lose 7 lbs in the first 6 months to continue with counseling)   ASK: We discussed the diagnosis of obesity with [name] today and Angell agreed to give [place] permission to discuss obesity behavioral modification therapy today.  ASSESS: Brynnlee has the diagnosis of obesity and her BMI today is 39.8 Shweta is in the action stage of change   ADVISE: Wenda was educated on the multiple health risks of obesity as well as the benefit of weight loss to improve her health. She was advised of the need for long term treatment and the importance of lifestyle modifications.  AGREE: Multiple dietary modification options and treatment options were discussed and  Mitra agreed to the above obesity treatment plan.  I, [place] Crane, am acting as transcriptionist for Quillian Quince, MD  I have reviewed the above documentation for accuracy and completeness, and I agree with the above. -Quillian Quince, MD

## 2017-10-03 ENCOUNTER — Ambulatory Visit (INDEPENDENT_AMBULATORY_CARE_PROVIDER_SITE_OTHER): Payer: BLUE CROSS/BLUE SHIELD | Admitting: Family Medicine

## 2017-10-06 ENCOUNTER — Other Ambulatory Visit (INDEPENDENT_AMBULATORY_CARE_PROVIDER_SITE_OTHER): Payer: Self-pay | Admitting: Family Medicine

## 2017-10-06 DIAGNOSIS — E559 Vitamin D deficiency, unspecified: Secondary | ICD-10-CM

## 2017-10-11 ENCOUNTER — Ambulatory Visit (INDEPENDENT_AMBULATORY_CARE_PROVIDER_SITE_OTHER): Payer: BLUE CROSS/BLUE SHIELD | Admitting: Family Medicine

## 2017-10-11 VITALS — BP 121/79 | HR 100 | Temp 98.0°F | Ht 64.0 in | Wt 231.0 lb

## 2017-10-11 DIAGNOSIS — Z9189 Other specified personal risk factors, not elsewhere classified: Secondary | ICD-10-CM

## 2017-10-11 DIAGNOSIS — E559 Vitamin D deficiency, unspecified: Secondary | ICD-10-CM

## 2017-10-11 DIAGNOSIS — Z6839 Body mass index (BMI) 39.0-39.9, adult: Secondary | ICD-10-CM

## 2017-10-11 MED ORDER — CALCITRIOL 0.25 MCG PO CAPS: 0.2500 ug | ORAL_CAPSULE | Freq: Two times a day (BID) | ORAL | 0 refills | Status: DC

## 2017-10-12 NOTE — Progress Notes (Signed)
Office: [phone]  /  Fax: [phone]   HPI:   Chief Complaint: OBESITY Tiffany Medina is here to discuss her progress with her obesity treatment plan. She is on the  follow the Pescatarian eating plan and is following her eating plan approximately 75 % of the time. She states she is exercising 0 minutes 0 times per week. Tiffany Medina continues to do well with weight loss, even with recent job change and getting used to the new schedule and routine. She is combing our vegetarian plan and category 3 meal plan and is making sure to increase lean protein intake. Her weight is 231 lb (104.8 kg) today and has had a weight loss of 1 pound over a period of 4 weeks since her last visit. She has lost 31 lbs since starting treatment with [place].  Vitamin D deficiency Tiffany Medina has a diagnosis of vitamin D deficiency. She was put on calcitriol and denies nausea, vomiting or muscle weakness.  At risk for osteopenia and osteoporosis Tiffany Medina is at higher risk of osteopenia and osteoporosis due to vitamin D deficiency.   ALLERGIES: Allergies  Allergen Reactions  . Dihydrotachysterol Hives, Itching and Rash  . Vitamin D Analogs Hives, Itching and Rash    Flushing    MEDICATIONS: Current Outpatient Medications on File Prior to Visit  Medication Sig Dispense Refill  . Albuterol Sulfate (PROAIR RESPICLICK) 108 (90 Base) MCG/ACT AEPB Inhale 1-2 puffs into the lungs 4 (four) times daily as needed. 3 each 0  . ALPRAZolam (XANAX) 0.5 MG tablet TAKE 1 OR 2 TABLETS BY MOUTH TWICE DAILY AS NEEDED FOR insomnia, anxiety 90 tablet 2  . Armodafinil 200 MG TABS Take 1 tablet by mouth daily. 90 tablet 1  . aspirin (GOODSENSE ASPIRIN) 325 MG tablet Take 1 tablet (325 mg total) by mouth 2 (two) times daily. 100 tablet 3  . Black Cohosh 540 MG CAPS 1 po qhs 90 capsule 3  . buPROPion (WELLBUTRIN XL) 150 MG 24 hr tablet Take 1 tablet (150 mg total) by mouth daily. 30 tablet 5  . Cyanocobalamin 1000 MCG SUBL Take 1 tablet  (1,000 mcg total) by mouth daily. 90 each 3  . diphenhydrAMINE (BENADRYL) 25 MG tablet Take 1 tablet (25 mg total) by mouth every 6 (six) hours as needed for allergies. 100 tablet 3  . gabapentin (NEURONTIN) 300 MG capsule TAKE 1 TO 2 CAPSULES AT BEDTIME FOR NERVE PAIN 270 capsule 3  . levothyroxine (SYNTHROID, LEVOTHROID) 100 MCG tablet Take 1 tablet (100 mcg total) by mouth daily before breakfast. 90 tablet 3  . loratadine (CLARITIN) 10 MG tablet Take 1 tablet (10 mg total) by mouth daily. 90 tablet 1  . metFORMIN (GLUCOPHAGE) 500 MG tablet Take 1 tablet (500 mg total) by mouth daily with breakfast. 30 tablet 0  . pantoprazole (PROTONIX) 40 MG tablet Take 1 tablet (40 mg total) by mouth 2 (two) times daily. 60 tablet 11  . ranitidine (ZANTAC) 150 MG tablet Take 1 tablet (150 mg total) by mouth daily. 90 tablet 3  . traMADol (ULTRAM) 50 MG tablet Take 1 tablet (50 mg total) by mouth 2 (two) times daily. 180 tablet 1   No current facility-administered medications on file prior to visit.     PAST MEDICAL HISTORY: Past Medical History:  Diagnosis Date  . Ankle pain   . Anxiety   . Asthma   . Back pain   . Chest pain   . Chronic fatigue syndrome   . Constipation   .  COPD (chronic obstructive pulmonary disease) (HCC)   . Depression   . Discoid lupus   . DVT (deep venous thrombosis) (HCC)   . Dyspnea   . Family history of breast cancer   . Family history of colon cancer   . GERD (gastroesophageal reflux disease)   . Hot flashes   . Hypothyroidism   . Insomnia   . Leg edema   . Lupus   . Neuropathy   . Obesity   . Sciatica   . Sleep apnea     PAST SURGICAL HISTORY: Past Surgical History:  Procedure Laterality Date  . ABDOMINAL HYSTERECTOMY    . APPENDECTOMY    . KNEE ARTHROSCOPY W/ MENISCAL REPAIR    . TUBAL LIGATION  1987  . VEIN LIGATION AND STRIPPING      SOCIAL HISTORY: Social History   Tobacco Use  . Smoking status: Current Every Day Smoker    Packs/day: 1.00      Types: Cigarettes  . Smokeless tobacco: Never Used  Substance Use Topics  . Alcohol use: No    Alcohol/week: 0.0 oz  . Drug use: No    FAMILY HISTORY: Family History  Problem Relation Age of Onset  . Breast cancer Mother 6       bilateral breast cancer  . Hodgkin's lymphoma Father        dx in his 53s  . Lung cancer Father 35  . Lung cancer Sister 30       adenocarcinoma (also a smoker)  . Lupus Maternal Uncle   . Colon cancer Maternal Uncle        dx in his 33s  . Cancer Paternal Uncle        2 paternal uncles with cancer NOS  . Diabetes Maternal Grandmother   . Colon cancer Maternal Grandfather        late 25s  . Diabetes Paternal Grandmother   . Colon cancer Paternal Grandfather 33  . Breast cancer Sister 9  . Cancer Other   . Diabetes Other   . Mental illness Other     ROS: Review of Systems  Constitutional: Positive for weight loss.  Gastrointestinal: Negative for nausea and vomiting.  Musculoskeletal:       Negative for muscle weakness    PHYSICAL EXAM: Blood pressure 121/79, pulse 100, temperature 98 F (36.7 C), temperature source Oral, height 5\' 4"  (1.626 m), weight 231 lb (104.8 kg), SpO2 96 %. Body mass index is 39.65 kg/m. Physical Exam  Constitutional: She is oriented to person, place, and time. She appears well-developed and well-nourished.  Cardiovascular: Normal rate.  Pulmonary/Chest: Effort normal.  Musculoskeletal: Normal range of motion.  Neurological: She is oriented to person, place, and time.  Skin: Skin is warm and dry.  Psychiatric: She has a normal mood and affect. Her behavior is normal.  Vitals reviewed.   RECENT LABS AND TESTS: BMET    Component Value Date/Time   NA 139 08/08/2017 0844   K 5.2 08/08/2017 0844   CL 98 08/08/2017 0844   CO2 26 08/08/2017 0844   GLUCOSE 87 08/08/2017 0844   GLUCOSE 112 (H) 12/02/2016 1756   BUN 17 08/08/2017 0844   CREATININE 0.72 08/08/2017 0844   CALCIUM 9.6 08/08/2017 0844    GFRNONAA 97 08/08/2017 0844   GFRAA 111 08/08/2017 0844   Lab Results  Component Value Date   HGBA1C 5.7 (H) 08/08/2017   HGBA1C 6.0 (H) 04/11/2017   Lab Results  Component Value Date  INSULIN 11.5 08/08/2017   INSULIN 23.7 04/11/2017   CBC    Component Value Date/Time   WBC 7.4 04/11/2017 1040   WBC 6.0 12/02/2016 1756   RBC 4.51 04/11/2017 1040   RBC 4.20 12/02/2016 1756   HGB 13.9 04/11/2017 1040   HGB 14.4 12/25/2010 1504   HCT 41.6 04/11/2017 1040   HCT 41.5 12/25/2010 1504   PLT 316 12/02/2016 1756   PLT 329 12/25/2010 1504   MCV 92 04/11/2017 1040   MCV 94.7 12/25/2010 1504   MCH 30.8 04/11/2017 1040   MCH 31.7 12/02/2016 1756   MCHC 33.4 04/11/2017 1040   MCHC 33.3 12/02/2016 1756   RDW 13.6 04/11/2017 1040   RDW 13.0 12/25/2010 1504   LYMPHSABS 1.2 04/11/2017 1040   LYMPHSABS 1.7 12/25/2010 1504   MONOABS 0.5 12/02/2016 1756   MONOABS 0.8 12/25/2010 1504   EOSABS 0.2 04/11/2017 1040   BASOSABS 0.0 04/11/2017 1040   BASOSABS 0.0 12/25/2010 1504   Iron/TIBC/Ferritin/ %Sat No results found for: IRON, TIBC, FERRITIN, IRONPCTSAT Lipid Panel     Component Value Date/Time   CHOL 164 08/08/2017 0844   TRIG 106 08/08/2017 0844   HDL 46 08/08/2017 0844   CHOLHDL 3 03/26/2016 1704   VLDL 23.0 03/26/2016 1704   LDLCALC 97 08/08/2017 0844   Hepatic Function Panel     Component Value Date/Time   PROT 6.6 08/08/2017 0844   ALBUMIN 4.4 08/08/2017 0844   AST 16 08/08/2017 0844   ALT 21 08/08/2017 0844   ALKPHOS 80 08/08/2017 0844   BILITOT 0.2 08/08/2017 0844   BILIDIR 0.0 03/26/2016 1704      Component Value Date/Time   TSH 0.600 08/08/2017 0844   TSH 0.399 (L) 04/11/2017 1040   TSH 0.58 06/18/2016 1153   Results for URA, YINGLING (MRN [id number]) as of 10/12/2017 09:39  Ref. Range 08/08/2017 08:44  Vitamin D, 25-Hydroxy Latest Ref Range: 30.0 - 100.0 ng/mL 28.5 (L)   ASSESSMENT AND PLAN: Vitamin D deficiency - Plan: calcitRIOL (ROCALTROL) 0.25 MCG  capsule  At risk for osteoporosis  Class 2 severe obesity with serious comorbidity and body mass index (BMI) of 39.0 to 39.9 in adult, unspecified obesity type (HCC)  PLAN:  Vitamin D Deficiency Tiffany Medina was informed that low vitamin D levels contributes to fatigue and are associated with obesity, breast, and colon cancer. She agrees to continue to take calcitriol 0.25 mcg 2 times daily #60 with no refills and we will recheck labs in 4 to 6 weeks. She will follow up for routine testing of vitamin D, at least 2-3 times per year. She was informed of the risk of over-replacement of vitamin D and agrees to not increase her dose unless she discusses this with [place] first. Tiffany Medina agrees to follow up with our clinic in 3 weeks.  At risk for osteopenia and osteoporosis Tiffany Medina is at risk for osteopenia and osteoporosis due to her vitamin D deficiency. She was encouraged to take her vitamin D and follow her higher calcium diet and increase strengthening exercise to help strengthen her bones and decrease her risk of osteopenia and osteoporosis.  Obesity Tiffany Medina is currently in the action stage of change. As such, her goal is to continue with weight loss efforts She has agreed to follow the Category 3 plan and follow our protein rich vegetarian plan Tiffany Medina has been instructed to work up to a goal of 150 minutes of combined cardio and strengthening exercise per week for weight loss and overall health  benefits. We discussed the following Behavioral Modification Strategies today: increasing lean protein intake, decreasing simple carbohydrates  and work on meal planning and easy cooking plans  Tiffany Medina has agreed to follow up with our clinic in 3 weeks. She was informed of the importance of frequent follow up visits to maximize her success with intensive lifestyle modifications for her multiple health conditions.   OBESITY BEHAVIORAL INTERVENTION VISIT  Today's visit was # 8 out of 22.  Starting  weight: 262 lbs Starting date: 04/11/17 Today's weight : 231 lbs Today's date: 10/11/2017 Total lbs lost to date: 7031 (Patients must lose 7 lbs in the first 6 months to continue with counseling)   ASK: We discussed the diagnosis of obesity with Tiffany Medina today and Tiffany Medina agreed to give us permission to discuss obesity behavioral modification therapy today.  ASSESS: Tiffany Medina has the diagnosis of obesity and her BMI today is 39.63 Tiffany Medina is in the action stage of change   ADVISE: Tiffany Medina was educated on the multiple health risks of obesity as well as the benefit of weight loss to improve her health. She was advised of the need for long term treatment and the importance of lifestyle modifications.  AGREE: Multiple dietary modification options and treatment options were discussed and  Tiffany Medina agreed to the above obesity treatment plan.  I, [place] CraneJoanne Murray, am acting as transcriptionist for Quillian Quincearen Beasley, MD  I have reviewed the above documentation for accuracy and completeness, and I agree with the above. -Quillian Quincearen Beasley, MD

## 2017-10-13 ENCOUNTER — Encounter: Payer: Self-pay | Admitting: Family Medicine

## 2017-10-13 ENCOUNTER — Ambulatory Visit: Payer: BLUE CROSS/BLUE SHIELD | Admitting: Family Medicine

## 2017-10-13 ENCOUNTER — Ambulatory Visit (INDEPENDENT_AMBULATORY_CARE_PROVIDER_SITE_OTHER)
Admission: RE | Admit: 2017-10-13 | Discharge: 2017-10-13 | Disposition: A | Discharge status: Home / Self Care | Payer: BLUE CROSS/BLUE SHIELD | Source: Ambulatory Visit | Attending: Family Medicine | Admitting: Family Medicine

## 2017-10-13 VITALS — BP 134/80 | HR 94 | Temp 98.4°F | Ht 64.0 in | Wt 231.0 lb

## 2017-10-13 DIAGNOSIS — S61210A Laceration without foreign body of right index finger without damage to nail, initial encounter: Secondary | ICD-10-CM | POA: Diagnosis not present

## 2017-10-13 DIAGNOSIS — Z23 Encounter for immunization: Secondary | ICD-10-CM

## 2017-10-13 DIAGNOSIS — M79644 Pain in right finger(s): Secondary | ICD-10-CM

## 2017-10-13 DIAGNOSIS — K1379 Other lesions of oral mucosa: Secondary | ICD-10-CM

## 2017-10-13 DIAGNOSIS — S6991XA Unspecified injury of right wrist, hand and finger(s), initial encounter: Secondary | ICD-10-CM | POA: Diagnosis not present

## 2017-10-13 DIAGNOSIS — M7989 Other specified soft tissue disorders: Secondary | ICD-10-CM | POA: Diagnosis not present

## 2017-10-13 MED ORDER — FLUCONAZOLE 150 MG PO TABS: 150.0000 mg | ORAL_TABLET | Freq: Once | ORAL | 0 refills | Status: AC

## 2017-10-13 MED ORDER — CEPHALEXIN 500 MG PO CAPS: 500.0000 mg | ORAL_CAPSULE | Freq: Three times a day (TID) | ORAL | 0 refills | Status: DC

## 2017-10-13 NOTE — Progress Notes (Signed)
Tiffany Medina - 52 y.o. female MRN [id number]  Date of birth: [birth year]  SUBJECTIVE:  Including CC & ROS.  Chief Complaint  Patient presents with  . Finger Injury  . Sore in mouth    Tiffany Medina is a 52 y.o. female that is presenting with finger injury and mouth sore.   She slammed her finger in the car door this morning. Admits to bruising and swelling. She can still move her finger. The cut is on her index finger obliquely on the palmer fingertip. This occurred this morning. She placed steri-strips on her finger. Having localized pain that is severe in nature. Unsure of the last time she received a tetanus vaccination.   Mouth sores located the roof of her mouth. Admits to difficulty swallowing. Present for two days. Denis fevers.   Review of Systems  Constitutional: Negative for fever.  HENT: Positive for sore throat.   Cardiovascular: Negative for chest pain.  Gastrointestinal: Negative for abdominal pain.  Musculoskeletal: Negative for gait problem.  Skin: Positive for wound.  Allergic/Immunologic: Negative for immunocompromised state.  Neurological: Negative for weakness.  Hematological: Negative for adenopathy.  Psychiatric/Behavioral: Negative for agitation.    HISTORY: Past Medical, Surgical, Social, and Family History Reviewed & Updated per EMR.   Pertinent Historical Findings include:  Past Medical History:  Diagnosis Date  . Ankle pain   . Anxiety   . Asthma   . Back pain   . Chest pain   . Chronic fatigue syndrome   . Constipation   . COPD (chronic obstructive pulmonary disease) (HCC)   . Depression   . Discoid lupus   . DVT (deep venous thrombosis) (HCC)   . Dyspnea   . Family history of breast cancer   . Family history of colon cancer   . GERD (gastroesophageal reflux disease)   . Hot flashes   . Hypothyroidism   . Insomnia   . Leg edema   . Lupus (HCC)   . Neuropathy   . Obesity   . Sciatica   . Sleep apnea     Past Surgical History:    Procedure Laterality Date  . ABDOMINAL HYSTERECTOMY    . APPENDECTOMY    . KNEE ARTHROSCOPY W/ MENISCAL REPAIR    . TUBAL LIGATION  1987  . VEIN LIGATION AND STRIPPING      Allergies  Allergen Reactions  . Dihydrotachysterol Hives, Itching and Rash  . Vitamin D Analogs Hives, Itching and Rash    Flushing    Family History  Problem Relation Age of Onset  . Breast cancer Mother 6       bilateral breast cancer  . Hodgkin's lymphoma Father        dx in his 26s  . Lung cancer Father 31  . Lung cancer Sister 63       adenocarcinoma (also a smoker)  . Lupus Maternal Uncle   . Colon cancer Maternal Uncle        dx in his 91s  . Cancer Paternal Uncle        2 paternal uncles with cancer NOS  . Diabetes Maternal Grandmother   . Colon cancer Maternal Grandfather        late 30s  . Diabetes Paternal Grandmother   . Colon cancer Paternal Grandfather 70  . Breast cancer Sister 35  . Cancer Other   . Diabetes Other   . Mental illness Other      Social History   Socioeconomic History  .  Marital status: Married    Spouse name: Toby  . Number of children: 1  . Years of education: College  . Highest education level: Not on file  Occupational History  . 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 Financial resource strain: Not on file  . Food insecurity:    Worry: Not on file    Inability: Not on file  . Transportation needs:    Medical: Not on file    Non-medical: Not on file  Tobacco Use  . Smoking status: Current Every Day Smoker    Packs/day: 1.00    Types: Cigarettes  . Smokeless tobacco: Never Used  Substance and Sexual Activity  . Alcohol use: No    Alcohol/week: 0.0 oz  . Drug use: No  . Sexual activity: Yes  Lifestyle  . Physical activity:    Days per week: Not on file    Minutes per session: Not on file  . Stress: Not on file  Relationships  . Social connections:    Talks on phone: Not on file    Gets together: Not on file     Attends religious service: Not on file    Active member of club or organization: Not on file    Attends meetings of clubs or organizations: Not on file    Relationship status: Not on file  . Intimate partner violence:    Fear of current or ex partner: Not on file    Emotionally abused: Not on file    Physically abused: Not on file    Forced sexual activity: Not on file  Other Topics Concern  . Not on file  Social History Narrative   Drinks 2 cups of coffee a day and 2 Dt Mt Dews     PHYSICAL EXAM:  VS: BP 134/80 (BP Location: Left Arm, Patient Position: Sitting, Cuff Size: Large)   Pulse 94   Temp 98.4 F (36.9 C) (Oral)   Ht 5\' 4"  (1.626 m)   Wt 231 lb (104.8 kg)   SpO2 95%   BMI 39.65 kg/m  Physical Exam Gen: NAD, alert, cooperative with exam, well-appearing ENT: normal lips, normal nasal mucosa,  Eye: normal EOM, normal conjunctiva and lids CV:  no edema, +2 pedal pulses   Resp: no accessory muscle use, non-labored,  Skin: no rashes, no areas of induration  Neuro: normal tone, normal sensation to touch Psych:  normal insight, alert and oriented MSK:  Right hand:  Oblique laceration roughly 1 cm in length on the distal palmer index finger  Normal flexion and extension of the index finger  Neurovascularly intact   Laceration Procedure Note:   Procedure Name: Laceration Repair Indication: Reduce risk of infection Location: right index distal phalanx  Pre-Procedure Diagnosis: Laceration Post-Procedure Diagnosis: Repaired Laceration Informed consent was obtained before procedure started. PROCEDURE: The appropriate timeout was taken. The area was prepped and draped in the usual sterile fashion. Local anesthesia was achieved using 5cc of  Lidocaine 1% /without epinephrine. The wound was copiously irrigated. 1 3-0 Nylon interrupted suture were placed. 3 6-0 Nylon interrupted sutures were placed   Estimated blood loss was less than 0.5 mL. A dressing was applied to the  area and anticipatory guidance, as well as standard post-procedure care, was explained. Return precautions are given. The patient tolerated the procedure well without complications. Follow-up visit set for suture removal and evaluation of the laceration.     ASSESSMENT & PLAN:   Mouth sores  No abnormalities of the mouth - continue to monitor.   Laceration of right index finger without foreign body without damage to nail Slammed her finger in the door.  - 1 3-0 and 3 6-0 placed  - xray  - tetanus provided  - keflex for 3 days  - follow up in 6 days

## 2017-10-13 NOTE — Patient Instructions (Signed)
Please follow up with me in one week.    Laceration Care, Adult A laceration is a cut that goes through all layers of the skin. The cut also goes into the tissue that is right under the skin. Some cuts heal on their own. Others need to be closed with stitches (sutures), staples, skin adhesive strips, or wound glue. Taking care of your cut lowers your risk of infection and helps your cut to heal better. How to take care of your cut For stitches or staples:  Keep the wound clean and dry.  If you were given a bandage (dressing), you should change it at least one time per day or as told by your doctor. You should also change it if it gets wet or dirty.  Keep the wound completely dry for the first 24 hours or as told by your doctor. After that time, you may take a shower or a bath. However, make sure that the wound is not soaked in water until after the stitches or staples have been removed.  Clean the wound one time each day or as told by your doctor: ? Wash the wound with soap and water. ? Rinse the wound with water until all of the soap comes off. ? Pat the wound dry with a clean towel. Do not rub the wound.  After you clean the wound, put a thin layer of antibiotic ointment on it as told by your doctor. This ointment: ? Helps to prevent infection. ? Keeps the bandage from sticking to the wound.  Have your stitches or staples removed as told by your doctor. If your doctor used skin adhesive strips:  Keep the wound clean and dry.  If you were given a bandage, you should change it at least one time per day or as told by your doctor. You should also change it if it gets dirty or wet.  Do not get the skin adhesive strips wet. You can take a shower or a bath, but be careful to keep the wound dry.  If the wound gets wet, pat it dry with a clean towel. Do not rub the wound.  Skin adhesive strips fall off on their own. You can trim the strips as the wound heals. Do not remove any strips that  are still stuck to the wound. They will fall off after a while. If your doctor used wound glue:  Try to keep your wound dry, but you may briefly wet it in the shower or bath. Do not soak the wound in water, such as by swimming.  After you take a shower or a bath, gently pat the wound dry with a clean towel. Do not rub the wound.  Do not do any activities that will make you really sweaty until the skin glue has fallen off on its own.  Do not apply liquid, cream, or ointment medicine to your wound while the skin glue is still on.  If you were given a bandage, you should change it at least one time per day or as told by your doctor. You should also change it if it gets dirty or wet.  If a bandage is placed over the wound, do not let the tape for the bandage touch the skin glue.  Do not pick at the glue. The skin glue usually stays on for 5-10 days. Then, it falls off of the skin. General Instructions  To help prevent scarring, make sure to cover your wound with sunscreen whenever you are  outside after stitches are removed, after adhesive strips are removed, or when wound glue stays in place and the wound is healed. Make sure to wear a sunscreen of at least 30 SPF.  Take over-the-counter and prescription medicines only as told by your doctor.  If you were given antibiotic medicine or ointment, take or apply it as told by your doctor. Do not stop using the antibiotic even if your wound is getting better.  Do not scratch or pick at the wound.  Keep all follow-up visits as told by your doctor. This is important.  Check your wound every day for signs of infection. Watch for: ? Redness, swelling, or pain. ? Fluid, blood, or pus.  Raise (elevate) the injured area above the level of your heart while you are sitting or lying down, if possible. Get help if:  You got a tetanus shot and you have any of these problems at the injection site: ? Swelling. ? Very bad  pain. ? Redness. ? Bleeding.  You have a fever.  A wound that was closed breaks open.  You notice a bad smell coming from your wound or your bandage.  You notice something coming out of the wound, such as wood or glass.  Medicine does not help your pain.  You have more redness, swelling, or pain at the site of your wound.  You have fluid, blood, or pus coming from your wound.  You notice a change in the color of your skin near your wound.  You need to change the bandage often because fluid, blood, or pus is coming from the wound.  You start to have a new rash.  You start to have numbness around the wound. Get help right away if:  You have very bad swelling around the wound.  Your pain suddenly gets worse and is very bad.  You notice painful lumps near the wound or on skin that is anywhere on your body.  You have a red streak going away from your wound.  The wound is on your hand or foot and you cannot move a finger or toe like you usually can.  The wound is on your hand or foot and you notice that your fingers or toes look pale or bluish. This information is not intended to replace advice given to you by your health care provider. Make sure you discuss any questions you have with your health care provider. Document Released: 12/08/2007 Document Revised: 11/27/2015 Document Reviewed: 06/17/2014 Elsevier Interactive Patient Education  [employer]2018 [employer]

## 2017-10-14 DIAGNOSIS — K1379 Other lesions of oral mucosa: Secondary | ICD-10-CM | POA: Insufficient documentation

## 2017-10-14 DIAGNOSIS — S61210A Laceration without foreign body of right index finger without damage to nail, initial encounter: Secondary | ICD-10-CM | POA: Insufficient documentation

## 2017-10-14 NOTE — Assessment & Plan Note (Addendum)
Slammed her finger in the door.  - 1 3-0 and 3 6-0 placed  - xray  - tetanus provided  - keflex for 3 days  - follow up in 6 days

## 2017-10-14 NOTE — Assessment & Plan Note (Signed)
No abnormalities of the mouth - continue to monitor.

## 2017-10-19 ENCOUNTER — Ambulatory Visit: Payer: BLUE CROSS/BLUE SHIELD | Admitting: Family Medicine

## 2017-10-19 ENCOUNTER — Encounter: Payer: Self-pay | Admitting: Family Medicine

## 2017-10-19 DIAGNOSIS — S61210D Laceration without foreign body of right index finger without damage to nail, subsequent encounter: Secondary | ICD-10-CM

## 2017-10-19 NOTE — Progress Notes (Signed)
Tiffany Medina - 52 y.o. female MRN [id number]  Date of birth: 1966-03-16  SUBJECTIVE:  Including CC & ROS.  Chief Complaint  Patient presents with  . Follow-up    Tiffany Medina is a 52 y.o. female that is here today for right finger laceration. She completed a course of Keflex. She has been keeping in a splint. Denies any redness or increased pain.   Independent review of right index finger xray from 4/11 showed no fracture.   Review of Systems  Constitutional: Negative for fever.  HENT: Negative for congestion.   Respiratory: Negative for cough.   Skin: Positive for wound.    HISTORY: Past Medical, Surgical, Social, and Family History Reviewed & Updated per EMR.   Pertinent Historical Findings include:  Past Medical History:  Diagnosis Date  . Ankle pain   . Anxiety   . Asthma   . Back pain   . Chest pain   . Chronic fatigue syndrome   . Constipation   . COPD (chronic obstructive pulmonary disease) (HCC)   . Depression   . Discoid lupus   . DVT (deep venous thrombosis) (HCC)   . Dyspnea   . Family history of breast cancer   . Family history of colon cancer   . GERD (gastroesophageal reflux disease)   . Hot flashes   . Hypothyroidism   . Insomnia   . Leg edema   . Lupus (HCC)   . Neuropathy   . Obesity   . Sciatica   . Sleep apnea     Past Surgical History:  Procedure Laterality Date  . ABDOMINAL HYSTERECTOMY    . APPENDECTOMY    . KNEE ARTHROSCOPY W/ MENISCAL REPAIR    . TUBAL LIGATION  1987  . VEIN LIGATION AND STRIPPING      Allergies  Allergen Reactions  . Dihydrotachysterol Hives, Itching and Rash  . Vitamin D Analogs Hives, Itching and Rash    Flushing    Family History  Problem Relation Age of Onset  . Breast cancer Mother 2645       bilateral breast cancer  . Hodgkin's lymphoma Father        dx in his 3240s  . Lung cancer Father 2474  . Lung cancer Sister 6539       adenocarcinoma (also a smoker)  . Lupus Maternal Uncle   . Colon cancer  Maternal Uncle        dx in his 5550s  . Cancer Paternal Uncle        2 paternal uncles with cancer NOS  . Diabetes Maternal Grandmother   . Colon cancer Maternal Grandfather        late 7080s  . Diabetes Paternal Grandmother   . Colon cancer Paternal Grandfather 4360  . Breast cancer Sister 7665  . Cancer Other   . Diabetes Other   . Mental illness Other      Social History   Socioeconomic History  . Marital status: Married    Spouse name: Toby  . Number of children: 1  . Years of education: College  . Highest education level: Not on file  Occupational History  . 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 Financial resource strain: Not on file  . Food insecurity:    Worry: Not on file    Inability: Not on file  . Transportation needs:    Medical: Not on file    Non-medical: Not on file  Tobacco Use  .  Smoking status: Current Every Day Smoker    Packs/day: 1.00    Types: Cigarettes  . Smokeless tobacco: Never Used  Substance and Sexual Activity  . Alcohol use: No    Alcohol/week: 0.0 oz  . Drug use: No  . Sexual activity: Yes  Lifestyle  . Physical activity:    Days per week: Not on file    Minutes per session: Not on file  . Stress: Not on file  Relationships  . Social connections:    Talks on phone: Not on file    Gets together: Not on file    Attends religious service: Not on file    Active member of club or organization: Not on file    Attends meetings of clubs or organizations: Not on file    Relationship status: Not on file  . Intimate partner violence:    Fear of current or ex partner: Not on file    Emotionally abused: Not on file    Physically abused: Not on file    Forced sexual activity: Not on file  Other Topics Concern  . Not on file  Social History Narrative   Drinks 2 cups of coffee a day and 2 Dt Mt Dews     PHYSICAL EXAM:  VS: BP 124/68 (BP Location: Left Arm, Patient Position: Sitting, Cuff Size: Normal)   Pulse 76    Temp 98.1 F (36.7 C) (Oral)   Ht 5\' 4"  (1.626 m)   Wt 231 lb (104.8 kg)   SpO2 97%   BMI 39.65 kg/m  Physical Exam Gen: NAD, alert, cooperative with exam, well-appearing ENT: normal lips, normal nasal mucosa,  Eye: normal EOM, normal conjunctiva and lids CV:  no edema, +2 pedal pulses   Resp: no accessory muscle use, non-labored,  Skin: no rashes, no areas of induration  Neuro: normal tone, normal sensation to touch Psych:  normal insight, alert and oriented MSK:  Right Finger:  Healed laceration of volar aspect  Limited flexion and extension actively of DIP joint  Has fuller ROM passively in DIP  Neurovascularly intact.      ASSESSMENT & PLAN:   Laceration of right index finger without foreign body without damage to nail Removed stitches today. Will discontinue splint and can start passive range of motion for next week. Can start active ROM after that.

## 2017-10-19 NOTE — Assessment & Plan Note (Signed)
Removed stitches today. Will discontinue splint and can start passive range of motion for next week. Can start active ROM after that.

## 2017-11-03 ENCOUNTER — Ambulatory Visit (INDEPENDENT_AMBULATORY_CARE_PROVIDER_SITE_OTHER): Payer: BLUE CROSS/BLUE SHIELD | Admitting: Family Medicine

## 2017-11-03 VITALS — BP 111/75 | HR 94 | Temp 97.9°F | Ht 64.0 in | Wt 226.0 lb

## 2017-11-03 DIAGNOSIS — Z9189 Other specified personal risk factors, not elsewhere classified: Secondary | ICD-10-CM

## 2017-11-03 DIAGNOSIS — E559 Vitamin D deficiency, unspecified: Secondary | ICD-10-CM

## 2017-11-03 DIAGNOSIS — R7303 Prediabetes: Secondary | ICD-10-CM | POA: Diagnosis not present

## 2017-11-03 DIAGNOSIS — Z6838 Body mass index (BMI) 38.0-38.9, adult: Secondary | ICD-10-CM

## 2017-11-03 MED ORDER — CALCITRIOL 0.25 MCG PO CAPS: 0.2500 ug | ORAL_CAPSULE | Freq: Two times a day (BID) | ORAL | 0 refills | Status: DC

## 2017-11-03 MED ORDER — METFORMIN HCL 500 MG PO TABS: 500.0000 mg | ORAL_TABLET | Freq: Every day | ORAL | 0 refills | Status: DC

## 2017-11-07 DIAGNOSIS — K529 Noninfective gastroenteritis and colitis, unspecified: Secondary | ICD-10-CM | POA: Diagnosis not present

## 2017-11-07 NOTE — Progress Notes (Signed)
Office: [phone]  /  Fax: [phone]   HPI:   Chief Complaint: OBESITY Tiffany Medina is here to discuss her progress with her obesity treatment plan. She is on the Category 3 plan and is following her eating plan approximately 25 % of the time. She states she is exercising 0 minutes 0 times per week. Tiffany Medina has been very busy with traveling and caring for her mother in law. She is not following her plan closely, but she is trying to be mindful and is mostly controlling her portions and making smarter food choices. Her weight is 226 lb (102.5 kg) today and has had a weight loss of 5 pounds over a period of 3 weeks since her last visit. She has lost 36 lbs since starting treatment with [place].  Vitamin D deficiency Tiffany Medina has a diagnosis of vitamin D deficiency. She is stable on vit D and denies nausea, vomiting or muscle weakness.  Pre-Diabetes Tiffany Medina has a diagnosis of pre-diabetes based on her elevated Hgb A1c and was informed this puts her at greater risk of developing diabetes. She is stable on metformin and continues to work on diet and exercise to decrease risk of diabetes. She denies nausea or hypoglycemia.  At risk for diabetes Tiffany Medina is at higher than average risk for developing diabetes due to her obesity and pre-diabetes. She currently denies polyuria or polydipsia.  ALLERGIES: Allergies  Allergen Reactions  . Dihydrotachysterol Hives, Itching and Rash  . Vitamin D Analogs Hives, Itching and Rash    Flushing    MEDICATIONS: Current Outpatient Medications on File Prior to Visit  Medication Sig Dispense Refill  . Albuterol Sulfate (PROAIR RESPICLICK) 108 (90 Base) MCG/ACT AEPB Inhale 1-2 puffs into the lungs 4 (four) times daily as needed. 3 each 0  . ALPRAZolam (XANAX) 0.5 MG tablet TAKE 1 OR 2 TABLETS BY MOUTH TWICE DAILY AS NEEDED FOR insomnia, anxiety 90 tablet 2  . Armodafinil 200 MG TABS Take 1 tablet by mouth daily. 90 tablet 1  . aspirin (GOODSENSE ASPIRIN)  325 MG tablet Take 1 tablet (325 mg total) by mouth 2 (two) times daily. 100 tablet 3  . Black Cohosh 540 MG CAPS 1 po qhs 90 capsule 3  . buPROPion (WELLBUTRIN XL) 150 MG 24 hr tablet Take 1 tablet (150 mg total) by mouth daily. 30 tablet 5  . Cyanocobalamin 1000 MCG SUBL Take 1 tablet (1,000 mcg total) by mouth daily. 90 each 3  . diphenhydrAMINE (BENADRYL) 25 MG tablet Take 1 tablet (25 mg total) by mouth every 6 (six) hours as needed for allergies. 100 tablet 3  . gabapentin (NEURONTIN) 300 MG capsule TAKE 1 TO 2 CAPSULES AT BEDTIME FOR NERVE PAIN 270 capsule 3  . levothyroxine (SYNTHROID, LEVOTHROID) 100 MCG tablet Take 1 tablet (100 mcg total) by mouth daily before breakfast. 90 tablet 3  . loratadine (CLARITIN) 10 MG tablet Take 1 tablet (10 mg total) by mouth daily. 90 tablet 1  . ranitidine (ZANTAC) 150 MG tablet Take 1 tablet (150 mg total) by mouth daily. 90 tablet 3  . traMADol (ULTRAM) 50 MG tablet Take 1 tablet (50 mg total) by mouth 2 (two) times daily. 180 tablet 1   No current facility-administered medications on file prior to visit.     PAST MEDICAL HISTORY: Past Medical History:  Diagnosis Date  . Ankle pain   . Anxiety   . Asthma   . Back pain   . Chest pain   . Chronic fatigue syndrome   .  Constipation   . COPD (chronic obstructive pulmonary disease) (HCC)   . Depression   . Discoid lupus   . DVT (deep venous thrombosis) (HCC)   . Dyspnea   . Family history of breast cancer   . Family history of colon cancer   . GERD (gastroesophageal reflux disease)   . Hot flashes   . Hypothyroidism   . Insomnia   . Leg edema   . Lupus (HCC)   . Neuropathy   . Obesity   . Sciatica   . Sleep apnea     PAST SURGICAL HISTORY: Past Surgical History:  Procedure Laterality Date  . ABDOMINAL HYSTERECTOMY    . APPENDECTOMY    . KNEE ARTHROSCOPY W/ MENISCAL REPAIR    . TUBAL LIGATION  1987  . VEIN LIGATION AND STRIPPING      SOCIAL HISTORY: Social History    Tobacco Use  . Smoking status: Current Every Day Smoker    Packs/day: 1.00    Types: Cigarettes  . Smokeless tobacco: Never Used  Substance Use Topics  . Alcohol use: No    Alcohol/week: 0.0 oz  . Drug use: No    FAMILY HISTORY: Family History  Problem Relation Age of Onset  . Breast cancer Mother 45       bilateral breast cancer  . Hodgkin's lymphoma Father        dx in his 60s  . Lung cancer Father 37  . Lung cancer Sister 24       adenocarcinoma (also a smoker)  . Lupus Maternal Uncle   . Colon cancer Maternal Uncle        dx in his 56s  . Cancer Paternal Uncle        2 paternal uncles with cancer NOS  . Diabetes Maternal Grandmother   . Colon cancer Maternal Grandfather        late 72s  . Diabetes Paternal Grandmother   . Colon cancer Paternal Grandfather 64  . Breast cancer Sister 32  . Cancer Other   . Diabetes Other   . Mental illness Other     ROS: Review of Systems  Constitutional: Positive for weight loss.  Gastrointestinal: Negative for nausea and vomiting.  Genitourinary: Negative for frequency.  Musculoskeletal:       Negative for muscle weakness  Endo/Heme/Allergies: Negative for polydipsia.       Negative for hypoglycemia    PHYSICAL EXAM: Blood pressure 111/75, pulse 94, temperature 97.9 F (36.6 C), temperature source Oral, height  (1.626 m), weight 226 lb (102.5 kg), SpO2 97 %. Body mass index is 38.79 kg/m. Physical Exam  Constitutional: She is oriented to person, place, and time. She appears well-developed and well-nourished.  Cardiovascular: Normal rate.  Pulmonary/Chest: Effort normal.  Musculoskeletal: Normal range of motion.  Neurological: She is oriented to person, place, and time.  Skin: Skin is warm and dry.  Psychiatric: She has a normal mood and affect. Her behavior is normal.  Vitals reviewed.   RECENT LABS AND TESTS: BMET    Component Value Date/Time   NA 139 08/08/2017 0844   K 5.2 08/08/2017 0844   CL 98  08/08/2017 0844   CO2 26 08/08/2017 0844   GLUCOSE 87 08/08/2017 0844   GLUCOSE 112 (H) 12/02/2016 1756   BUN 17 08/08/2017 0844   CREATININE 0.72 08/08/2017 0844   CALCIUM 9.6 08/08/2017 0844   GFRNONAA 97 08/08/2017 0844   GFRAA 111 08/08/2017 0844   Lab Results  Component  Value Date   HGBA1C 5.7 (H) 08/08/2017   HGBA1C 6.0 (H) 04/11/2017   Lab Results  Component Value Date   INSULIN 11.5 08/08/2017   INSULIN 23.7 04/11/2017   CBC    Component Value Date/Time   WBC 7.4 04/11/2017 1040   WBC 6.0 12/02/2016 1756   RBC 4.51 04/11/2017 1040   RBC 4.20 12/02/2016 1756   HGB 13.9 04/11/2017 1040   HGB 14.4 12/25/2010 1504   HCT 41.6 04/11/2017 1040   HCT 41.5 12/25/2010 1504   PLT 316 12/02/2016 1756   PLT 329 12/25/2010 1504   MCV 92 04/11/2017 1040   MCV 94.7 12/25/2010 1504   MCH 30.8 04/11/2017 1040   MCH 31.7 12/02/2016 1756   MCHC 33.4 04/11/2017 1040   MCHC 33.3 12/02/2016 1756   RDW 13.6 04/11/2017 1040   RDW 13.0 12/25/2010 1504   LYMPHSABS 1.2 04/11/2017 1040   LYMPHSABS 1.7 12/25/2010 1504   MONOABS 0.5 12/02/2016 1756   MONOABS 0.8 12/25/2010 1504   EOSABS 0.2 04/11/2017 1040   BASOSABS 0.0 04/11/2017 1040   BASOSABS 0.0 12/25/2010 1504   Iron/TIBC/Ferritin/ %Sat No results found for: IRON, TIBC, FERRITIN, IRONPCTSAT Lipid Panel     Component Value Date/Time   CHOL 164 08/08/2017 0844   TRIG 106 08/08/2017 0844   HDL 46 08/08/2017 0844   CHOLHDL 3 03/26/2016 1704   VLDL 23.0 03/26/2016 1704   LDLCALC 97 08/08/2017 0844   Hepatic Function Panel     Component Value Date/Time   PROT 6.6 08/08/2017 0844   ALBUMIN 4.4 08/08/2017 0844   AST 16 08/08/2017 0844   ALT 21 08/08/2017 0844   ALKPHOS 80 08/08/2017 0844   BILITOT 0.2 08/08/2017 0844   BILIDIR 0.0 03/26/2016 1704      Component Value Date/Time   TSH 0.600 08/08/2017 0844   TSH 0.399 (L) 04/11/2017 1040   TSH 0.58 06/18/2016 1153   Results for THEO, REITHER (MRN [id number]) as of  11/07/2017 08:27  Ref. Range 08/08/2017 08:44  Vitamin D, 25-Hydroxy Latest Ref Range: 30.0 - 100.0 ng/mL 28.5 (L)   ASSESSMENT AND PLAN: Prediabetes - Plan: metFORMIN (GLUCOPHAGE) 500 MG tablet  Vitamin D deficiency - Plan: calcitRIOL (ROCALTROL) 0.25 MCG capsule  At risk for diabetes mellitus  Class 2 severe obesity with serious comorbidity and body mass index (BMI) of 38.0 to 38.9 in adult, unspecified obesity type (HCC)  PLAN:  Vitamin D Deficiency Falan was informed that low vitamin D levels contributes to fatigue and are associated with obesity, breast, and colon cancer. She agrees to continue to take prescription Calcitriol 0.25 mcg 2 times daily #60 with no refills and will follow up for routine testing of vitamin D, at least 2-3 times per year. She was informed of the risk of over-replacement of vitamin D and agrees to not increase her dose unless she discusses this with [place] first. Carroll agrees to follow up with our clinic in 3 to 4 weeks.  Pre-Diabetes Wilba will continue to work on weight loss, exercise, and decreasing simple carbohydrates in her diet to help decrease the risk of diabetes. We dicussed metformin including benefits and risks. She was informed that eating too many simple carbohydrates or too many calories at one sitting increases the likelihood of GI side effects. Tyshana requested metformin for now and a prescription was written today for 1 month refill. Navil agreed to follow up with [place] as directed to monitor her progress.  Diabetes risk counseling Kamela was given  extended (15 minutes) diabetes prevention counseling today. She is 52 y.o. female and has risk factors for diabetes including obesity, and pre-diabetes. We discussed intensive lifestyle modifications today with an emphasis on weight loss as well as increasing exercise and decreasing simple carbohydrates in her diet.  Obesity Jodie is currently in the action stage of change. As such, her  goal is to continue with weight loss efforts She has agreed to portion control better and make smarter food choices, such as increase vegetables and decrease simple carbohydrates  Tramya has been instructed to work up to a goal of 150 minutes of combined cardio and strengthening exercise per week for weight loss and overall health benefits. We discussed the following Behavioral Modification Strategies today: increase H2O intake, increasing lean protein intake and increasing vegetables  Ezri has agreed to follow up with our clinic in 3 to 4 weeks. She was informed of the importance of frequent follow up visits to maximize her success with intensive lifestyle modifications for her multiple health conditions.   OBESITY BEHAVIORAL INTERVENTION VISIT  Today's visit was # 9 out of 22.  Starting weight: 262 lbs Starting date: 04/11/17 Today's weight : 226 lbs Today's date: 11/03/2017 Total lbs lost to date: 70 (Patients must lose 7 lbs in the first 6 months to continue with counseling)   ASK: We discussed the diagnosis of obesity with [name] today and Tashae agreed to give [place] permission to discuss obesity behavioral modification therapy today.  ASSESS: Sherrise has the diagnosis of obesity and her BMI today is 38.77 Milynn is in the action stage of change   ADVISE: Marcianne was educated on the multiple health risks of obesity as well as the benefit of weight loss to improve her health. She was advised of the need for long term treatment and the importance of lifestyle modifications.  AGREE: Multiple dietary modification options and treatment options were discussed and  Katheleen agreed to the above obesity treatment plan.  I, [place] Crane, am acting as transcriptionist for Quillian Quince, MD  I have reviewed the above documentation for accuracy and completeness, and I agree with the above. -Quillian Quince, MD

## 2017-11-30 ENCOUNTER — Ambulatory Visit (INDEPENDENT_AMBULATORY_CARE_PROVIDER_SITE_OTHER): Payer: BLUE CROSS/BLUE SHIELD | Admitting: Family Medicine

## 2017-11-30 VITALS — BP 122/74 | HR 92 | Temp 98.2°F | Ht 64.0 in | Wt 227.0 lb

## 2017-11-30 DIAGNOSIS — R7303 Prediabetes: Secondary | ICD-10-CM

## 2017-11-30 DIAGNOSIS — Z9189 Other specified personal risk factors, not elsewhere classified: Secondary | ICD-10-CM | POA: Diagnosis not present

## 2017-11-30 DIAGNOSIS — Z6839 Body mass index (BMI) 39.0-39.9, adult: Secondary | ICD-10-CM | POA: Diagnosis not present

## 2017-11-30 MED ORDER — METFORMIN HCL 500 MG PO TABS: 500.0000 mg | ORAL_TABLET | Freq: Every day | ORAL | 0 refills | Status: DC

## 2017-11-30 NOTE — Progress Notes (Signed)
Office: [phone]  /  Fax: [phone]   HPI:   Chief Complaint: OBESITY Tiffany Medina is here to discuss her progress with her obesity treatment plan. She is on the portion control better and make smarter food choices, such as increase vegetables and decrease simple carbohydrates and is following her eating plan approximately 70 % of the time. She states she is exercising 0 minutes 0 times per week. Tiffany Medina has had increase stress with family emergencies and with new job. She had some stress eating but did well maintaining weight.  Her weight is 227 lb (103 kg) today and has gained 1 pound since her last visit. She has lost 35 lbs since starting treatment with [place].  Pre-Diabetes Tiffany Medina has a diagnosis of pre-diabetes based on her elevated Hgb A1c and was informed this puts her at greater risk of developing diabetes. She is working on diet but notes some increased simple carbohydrate with some increased emotional eating. She is taking metformin currently and continues to work on diet and exercise to decrease risk of diabetes. She denies nausea, vomiting, or hypoglycemia.  At risk for diabetes Tiffany Medina is at higher than average risk for developing diabetes due to her obesity and pre-diabetes. She currently denies polyuria or polydipsia.  ALLERGIES: Allergies  Allergen Reactions  . Dihydrotachysterol Hives, Itching and Rash  . Vitamin D Analogs Hives, Itching and Rash    Flushing    MEDICATIONS: Current Outpatient Medications on File Prior to Visit  Medication Sig Dispense Refill  . Albuterol Sulfate (PROAIR RESPICLICK) 108 (90 Base) MCG/ACT AEPB Inhale 1-2 puffs into the lungs 4 (four) times daily as needed. 3 each 0  . ALPRAZolam (XANAX) 0.5 MG tablet TAKE 1 OR 2 TABLETS BY MOUTH TWICE DAILY AS NEEDED FOR insomnia, anxiety 90 tablet 2  . Armodafinil 200 MG TABS Take 1 tablet by mouth daily. 90 tablet 1  . aspirin (GOODSENSE ASPIRIN) 325 MG tablet Take 1 tablet (325 mg total) by  mouth 2 (two) times daily. 100 tablet 3  . Black Cohosh 540 MG CAPS 1 po qhs 90 capsule 3  . buPROPion (WELLBUTRIN XL) 150 MG 24 hr tablet Take 1 tablet (150 mg total) by mouth daily. 30 tablet 5  . calcitRIOL (ROCALTROL) 0.25 MCG capsule Take 1 capsule (0.25 mcg total) by mouth 2 (two) times daily. 60 capsule 0  . Cyanocobalamin 1000 MCG SUBL Take 1 tablet (1,000 mcg total) by mouth daily. 90 each 3  . diphenhydrAMINE (BENADRYL) 25 MG tablet Take 1 tablet (25 mg total) by mouth every 6 (six) hours as needed for allergies. 100 tablet 3  . gabapentin (NEURONTIN) 300 MG capsule TAKE 1 TO 2 CAPSULES AT BEDTIME FOR NERVE PAIN 270 capsule 3  . levothyroxine (SYNTHROID, LEVOTHROID) 100 MCG tablet Take 1 tablet (100 mcg total) by mouth daily before breakfast. 90 tablet 3  . loratadine (CLARITIN) 10 MG tablet Take 1 tablet (10 mg total) by mouth daily. 90 tablet 1  . metFORMIN (GLUCOPHAGE) 500 MG tablet Take 1 tablet (500 mg total) by mouth daily with breakfast. 30 tablet 0  . ranitidine (ZANTAC) 150 MG tablet Take 1 tablet (150 mg total) by mouth daily. 90 tablet 3  . traMADol (ULTRAM) 50 MG tablet Take 1 tablet (50 mg total) by mouth 2 (two) times daily. 180 tablet 1   No current facility-administered medications on file prior to visit.     PAST MEDICAL HISTORY: Past Medical History:  Diagnosis Date  . Ankle pain   .  Anxiety   . Asthma   . Back pain   . Chest pain   . Chronic fatigue syndrome   . Constipation   . COPD (chronic obstructive pulmonary disease) (HCC)   . Depression   . Discoid lupus   . DVT (deep venous thrombosis) (HCC)   . Dyspnea   . Family history of breast cancer   . Family history of colon cancer   . GERD (gastroesophageal reflux disease)   . Hot flashes   . Hypothyroidism   . Insomnia   . Leg edema   . Lupus (HCC)   . Neuropathy   . Obesity   . Sciatica   . Sleep apnea     PAST SURGICAL HISTORY: Past Surgical History:  Procedure Laterality Date  .  ABDOMINAL HYSTERECTOMY    . APPENDECTOMY    . KNEE ARTHROSCOPY W/ MENISCAL REPAIR    . TUBAL LIGATION  1987  . VEIN LIGATION AND STRIPPING      SOCIAL HISTORY: Social History   Tobacco Use  . Smoking status: Current Every Day Smoker    Packs/day: 1.00    Types: Cigarettes  . Smokeless tobacco: Never Used  Substance Use Topics  . Alcohol use: No    Alcohol/week: 0.0 oz  . Drug use: No    FAMILY HISTORY: Family History  Problem Relation Age of Onset  . Breast cancer Mother 60       bilateral breast cancer  . Hodgkin's lymphoma Father        dx in his 92s  . Lung cancer Father 32  . Lung cancer Sister 60       adenocarcinoma (also a smoker)  . Lupus Maternal Uncle   . Colon cancer Maternal Uncle        dx in his 85s  . Cancer Paternal Uncle        2 paternal uncles with cancer NOS  . Diabetes Maternal Grandmother   . Colon cancer Maternal Grandfather        late 5s  . Diabetes Paternal Grandmother   . Colon cancer Paternal Grandfather 47  . Breast cancer Sister 32  . Cancer Other   . Diabetes Other   . Mental illness Other     ROS: Review of Systems  Constitutional: Negative for weight loss.  Gastrointestinal: Negative for nausea and vomiting.  Genitourinary: Negative for frequency.  Endo/Heme/Allergies: Negative for polydipsia.       Negative hypoglycemia    PHYSICAL EXAM: Blood pressure 122/74, pulse 92, temperature 98.2 F (36.8 C), temperature source Oral, height  (1.626 m), weight 227 lb (103 kg), SpO2 96 %. Body mass index is 38.96 kg/m. Physical Exam  Constitutional: She is oriented to person, place, and time. She appears well-developed and well-nourished.  Cardiovascular: Normal rate.  Pulmonary/Chest: Effort normal.  Musculoskeletal: Normal range of motion.  Neurological: She is oriented to person, place, and time.  Skin: Skin is warm and dry.  Psychiatric: She has a normal mood and affect. Her behavior is normal.  Vitals  reviewed.   RECENT LABS AND TESTS: BMET    Component Value Date/Time   NA 139 08/08/2017 0844   K 5.2 08/08/2017 0844   CL 98 08/08/2017 0844   CO2 26 08/08/2017 0844   GLUCOSE 87 08/08/2017 0844   GLUCOSE 112 (H) 12/02/2016 1756   BUN 17 08/08/2017 0844   CREATININE 0.72 08/08/2017 0844   CALCIUM 9.6 08/08/2017 0844   GFRNONAA 97 08/08/2017 0844  GFRAA 111 08/08/2017 0844   Lab Results  Component Value Date   HGBA1C 5.7 (H) 08/08/2017   HGBA1C 6.0 (H) 04/11/2017   Lab Results  Component Value Date   INSULIN 11.5 08/08/2017   INSULIN 23.7 04/11/2017   CBC    Component Value Date/Time   WBC 7.4 04/11/2017 1040   WBC 6.0 12/02/2016 1756   RBC 4.51 04/11/2017 1040   RBC 4.20 12/02/2016 1756   HGB 13.9 04/11/2017 1040   HGB 14.4 12/25/2010 1504   HCT 41.6 04/11/2017 1040   HCT 41.5 12/25/2010 1504   PLT 316 12/02/2016 1756   PLT 329 12/25/2010 1504   MCV 92 04/11/2017 1040   MCV 94.7 12/25/2010 1504   MCH 30.8 04/11/2017 1040   MCH 31.7 12/02/2016 1756   MCHC 33.4 04/11/2017 1040   MCHC 33.3 12/02/2016 1756   RDW 13.6 04/11/2017 1040   RDW 13.0 12/25/2010 1504   LYMPHSABS 1.2 04/11/2017 1040   LYMPHSABS 1.7 12/25/2010 1504   MONOABS 0.5 12/02/2016 1756   MONOABS 0.8 12/25/2010 1504   EOSABS 0.2 04/11/2017 1040   BASOSABS 0.0 04/11/2017 1040   BASOSABS 0.0 12/25/2010 1504   Iron/TIBC/Ferritin/ %Sat No results found for: IRON, TIBC, FERRITIN, IRONPCTSAT Lipid Panel     Component Value Date/Time   CHOL 164 08/08/2017 0844   TRIG 106 08/08/2017 0844   HDL 46 08/08/2017 0844   CHOLHDL 3 03/26/2016 1704   VLDL 23.0 03/26/2016 1704   LDLCALC 97 08/08/2017 0844   Hepatic Function Panel     Component Value Date/Time   PROT 6.6 08/08/2017 0844   ALBUMIN 4.4 08/08/2017 0844   AST 16 08/08/2017 0844   ALT 21 08/08/2017 0844   ALKPHOS 80 08/08/2017 0844   BILITOT 0.2 08/08/2017 0844   BILIDIR 0.0 03/26/2016 1704      Component Value Date/Time   TSH  0.600 08/08/2017 0844   TSH 0.399 (L) 04/11/2017 1040   TSH 0.58 06/18/2016 1153    ASSESSMENT AND PLAN: Prediabetes - Plan: metFORMIN (GLUCOPHAGE) 500 MG tablet  At risk for diabetes mellitus  Class 2 severe obesity with serious comorbidity and body mass index (BMI) of 39.0 to 39.9 in adult, unspecified obesity type (HCC)  PLAN:  Pre-Diabetes Tiffany Medina will continue to work on weight loss, exercise, and decreasing simple carbohydrates in her diet to help decrease the risk of diabetes. We dicussed metformin including benefits and risks. She was informed that eating too many simple carbohydrates or too many calories at one sitting increases the likelihood of GI side effects. Tiffany Medina agrees to continue taking metformin 500 mg q AM #30 and we will refill for 1 month. Tiffany Medina agrees to follow up with our clinic in 4 weeks as directed to monitor her progress.  Diabetes risk counselling Tiffany Medina was given extended (15 minutes) diabetes prevention counseling today. She is 52 y.o. female and has risk factors for diabetes including obesity and pre-diabetes. We discussed intensive lifestyle modifications today with an emphasis on weight loss as well as increasing exercise and decreasing simple carbohydrates in her diet.  Obesity Tiffany Medina is currently in the action stage of change. As such, her goal is to maintain weight for now. Tiffany Medina's goal is to maintain weight over next month. She has agreed to portion control better and make smarter food choices, such as increase vegetables and decrease simple carbohydrates  Tiffany Medina has been instructed to work up to a goal of 150 minutes of combined cardio and strengthening exercise per week  for weight loss and overall health benefits. We discussed the following Behavioral Modification Strategies today: increasing lean protein intake, decreasing simple carbohydrates , work on meal planning and easy cooking plans and emotional eating strategies   Tiffany Medina  has agreed to follow up with our clinic in 4 weeks. She was informed of the importance of frequent follow up visits to maximize her success with intensive lifestyle modifications for her multiple health conditions.   OBESITY BEHAVIORAL INTERVENTION VISIT  Today's visit was # 10 out of 22.  Starting weight: 262 lbs Starting date: 04/11/17 Today's weight : 227lbs Today's date: 11/30/2017 Total lbs lost to date: 77 (Patients must lose 7 lbs in the first 6 months to continue with counseling)   ASK: We discussed the diagnosis of obesity with Tiffany Medina today and Tiffany Medina agreed to give [place] permission to discuss obesity behavioral modification therapy today.  ASSESS: Tiffany Medina has the diagnosis of obesity and her BMI today is 38.95 Tiffany Medina is in the action stage of change   ADVISE: Tiffany Medina was educated on the multiple health risks of obesity as well as the benefit of weight loss to improve her health. She was advised of the need for long term treatment and the importance of lifestyle modifications.  AGREE: Multiple dietary modification options and treatment options were discussed and  Tiffany Medina agreed to the above obesity treatment plan.  I, Burt Knack, am acting as transcriptionist for Quillian Quince, MD  I have reviewed the above documentation for accuracy and completeness, and I agree with the above. -Quillian Quince, MD

## 2017-12-05 ENCOUNTER — Ambulatory Visit: Payer: BLUE CROSS/BLUE SHIELD | Admitting: Internal Medicine

## 2017-12-12 ENCOUNTER — Other Ambulatory Visit (INDEPENDENT_AMBULATORY_CARE_PROVIDER_SITE_OTHER): Payer: Self-pay | Admitting: Family Medicine

## 2017-12-12 DIAGNOSIS — E559 Vitamin D deficiency, unspecified: Secondary | ICD-10-CM

## 2017-12-28 ENCOUNTER — Ambulatory Visit: Payer: BLUE CROSS/BLUE SHIELD | Admitting: Internal Medicine

## 2017-12-28 ENCOUNTER — Encounter: Payer: Self-pay | Admitting: Internal Medicine

## 2017-12-28 ENCOUNTER — Other Ambulatory Visit: Payer: Self-pay | Admitting: Internal Medicine

## 2017-12-28 DIAGNOSIS — E038 Other specified hypothyroidism: Secondary | ICD-10-CM | POA: Diagnosis not present

## 2017-12-28 DIAGNOSIS — G2581 Restless legs syndrome: Secondary | ICD-10-CM | POA: Diagnosis not present

## 2017-12-28 DIAGNOSIS — E559 Vitamin D deficiency, unspecified: Secondary | ICD-10-CM

## 2017-12-28 DIAGNOSIS — K2100 Gastro-esophageal reflux disease with esophagitis, without bleeding: Secondary | ICD-10-CM

## 2017-12-28 MED ORDER — RANITIDINE HCL 150 MG PO TABS: 150.0000 mg | ORAL_TABLET | Freq: Two times a day (BID) | ORAL | 3 refills | Status: DC

## 2017-12-28 MED ORDER — ALBUTEROL SULFATE 108 (90 BASE) MCG/ACT IN AEPB: 1.0000 | INHALATION_SPRAY | Freq: Four times a day (QID) | RESPIRATORY_TRACT | 0 refills | Status: DC | PRN

## 2017-12-28 MED ORDER — ALPRAZOLAM 0.5 MG PO TABS: ORAL_TABLET | ORAL | 2 refills | Status: DC

## 2017-12-28 MED ORDER — BUPROPION HCL ER (XL) 150 MG PO TB24: 150.0000 mg | ORAL_TABLET | Freq: Every day | ORAL | 5 refills | Status: DC

## 2017-12-28 MED ORDER — TRAMADOL HCL 50 MG PO TABS: 50.0000 mg | ORAL_TABLET | Freq: Two times a day (BID) | ORAL | 1 refills | Status: DC

## 2017-12-28 MED ORDER — LORATADINE 10 MG PO TABS: 10.0000 mg | ORAL_TABLET | Freq: Every day | ORAL | 1 refills | Status: DC

## 2017-12-28 MED ORDER — BUPROPION HCL ER (XL) 150 MG PO TB24
150.0000 mg | ORAL_TABLET | Freq: Every day | ORAL | 5 refills | Status: DC
Start: 2017-12-28 — End: 2017-12-28

## 2017-12-28 MED ORDER — ARMODAFINIL 200 MG PO TABS: 1.0000 | ORAL_TABLET | Freq: Every day | ORAL | 1 refills | Status: DC

## 2017-12-28 NOTE — Assessment & Plan Note (Signed)
On Tramadol, Requip, Gabapentin

## 2017-12-28 NOTE — Assessment & Plan Note (Signed)
Protonix bid Zantac bid 

## 2017-12-28 NOTE — Assessment & Plan Note (Signed)
Wt Readings from Last 3 Encounters:  12/28/17 229 lb (103.9 kg)  11/30/17 227 lb (103 kg)  11/03/17 226 lb (102.5 kg)

## 2017-12-28 NOTE — Telephone Encounter (Signed)
Check [place] registry last filled 12/07/2017../[facility]  

## 2017-12-28 NOTE — Assessment & Plan Note (Signed)
Vit D 

## 2017-12-28 NOTE — Progress Notes (Signed)
Subjective:  Patient ID: Tiffany Medina, female    DOB: 12-Mar-1966  Age: 52 y.o. MRN: [id number]  CC: No chief complaint on file.   HPI Tiffany Medina presents for restless legs, ADD, obesity - lost 40 lbs  Outpatient Medications Prior to Visit  Medication Sig Dispense Refill  . Albuterol Sulfate (PROAIR RESPICLICK) 108 (90 Base) MCG/ACT AEPB Inhale 1-2 puffs into the lungs 4 (four) times daily as needed. 3 each 0  . ALPRAZolam (XANAX) 0.5 MG tablet TAKE 1 OR 2 TABLETS BY MOUTH TWICE DAILY AS NEEDED FOR insomnia, anxiety 90 tablet 2  . Armodafinil 200 MG TABS Take 1 tablet by mouth daily. 90 tablet 1  . aspirin (GOODSENSE ASPIRIN) 325 MG tablet Take 1 tablet (325 mg total) by mouth 2 (two) times daily. 100 tablet 3  . Black Cohosh 540 MG CAPS 1 po qhs 90 capsule 3  . buPROPion (WELLBUTRIN XL) 150 MG 24 hr tablet Take 1 tablet (150 mg total) by mouth daily. 30 tablet 5  . calcitRIOL (ROCALTROL) 0.25 MCG capsule Take 1 capsule by mouth 2 times daily. 60 capsule 0  . Cyanocobalamin 1000 MCG SUBL Take 1 tablet (1,000 mcg total) by mouth daily. 90 each 3  . diphenhydrAMINE (BENADRYL) 25 MG tablet Take 1 tablet (25 mg total) by mouth every 6 (six) hours as needed for allergies. 100 tablet 3  . gabapentin (NEURONTIN) 300 MG capsule TAKE 1 TO 2 CAPSULES AT BEDTIME FOR NERVE PAIN 270 capsule 3  . levothyroxine (SYNTHROID, LEVOTHROID) 100 MCG tablet Take 1 tablet (100 mcg total) by mouth daily before breakfast. 90 tablet 3  . loratadine (CLARITIN) 10 MG tablet Take 1 tablet (10 mg total) by mouth daily. 90 tablet 1  . metFORMIN (GLUCOPHAGE) 500 MG tablet Take 1 tablet (500 mg total) by mouth daily with breakfast. 30 tablet 0  . ranitidine (ZANTAC) 150 MG tablet Take 1 tablet (150 mg total) by mouth daily. 90 tablet 3  . traMADol (ULTRAM) 50 MG tablet Take 1 tablet (50 mg total) by mouth 2 (two) times daily. 180 tablet 1   No facility-administered medications prior to visit.     ROS: Review of  Systems  Constitutional: Negative for activity change, appetite change, chills, fatigue and unexpected weight change.  HENT: Negative for congestion, mouth sores and sinus pressure.   Eyes: Negative for visual disturbance.  Respiratory: Negative for cough and chest tightness.   Gastrointestinal: Negative for abdominal pain and nausea.  Genitourinary: Negative for difficulty urinating, frequency and vaginal pain.  Musculoskeletal: Positive for back pain and gait problem.  Skin: Negative for pallor and rash.  Neurological: Negative for dizziness, tremors, weakness, numbness and headaches.  Psychiatric/Behavioral: Negative for confusion, sleep disturbance and suicidal ideas. The patient is nervous/anxious.     Objective:  BP 118/78 (BP Location: Left Arm, Patient Position: Sitting, Cuff Size: Large)   Pulse 67   Temp 98.2 F (36.8 C) (Oral)   Ht 5\' 4"  (1.626 m)   Wt 229 lb (103.9 kg)   SpO2 96%   BMI 39.31 kg/m   BP Readings from Last 3 Encounters:  12/28/17 118/78  11/30/17 122/74  11/03/17 111/75    Wt Readings from Last 3 Encounters:  12/28/17 229 lb (103.9 kg)  11/30/17 227 lb (103 kg)  11/03/17 226 lb (102.5 kg)    Physical Exam  Constitutional: She appears well-developed. No distress.  HENT:  Head: Normocephalic.  Right Ear: External ear normal.  Left Ear: External ear  normal.  Nose: Nose normal.  Mouth/Throat: Oropharynx is clear and moist.  Eyes: Pupils are equal, round, and reactive to light. Conjunctivae are normal. Right eye exhibits no discharge. Left eye exhibits no discharge.  Neck: Normal range of motion. Neck supple. No JVD present. No tracheal deviation present. No thyromegaly present.  Cardiovascular: Normal rate, regular rhythm and normal heart sounds.  Pulmonary/Chest: No stridor. No respiratory distress. She has no wheezes.  Abdominal: Soft. Bowel sounds are normal. She exhibits no distension and no mass. There is no tenderness. There is no rebound  and no guarding.  Musculoskeletal: She exhibits no edema or tenderness.  Lymphadenopathy:    She has no cervical adenopathy.  Neurological: She displays normal reflexes. No cranial nerve deficit. She exhibits normal muscle tone. Coordination normal.  Skin: No rash noted. No erythema.  Psychiatric: She has a normal mood and affect. Her behavior is normal. Judgment and thought content normal.    Lab Results  Component Value Date   WBC 7.4 04/11/2017   HGB 13.9 04/11/2017   HCT 41.6 04/11/2017   PLT 316 12/02/2016   GLUCOSE 87 08/08/2017   CHOL 164 08/08/2017   TRIG 106 08/08/2017   HDL 46 08/08/2017   LDLCALC 97 08/08/2017   ALT 21 08/08/2017   AST 16 08/08/2017   NA 139 08/08/2017   K 5.2 08/08/2017   CL 98 08/08/2017   CREATININE 0.72 08/08/2017   BUN 17 08/08/2017   CO2 26 08/08/2017   TSH 0.600 08/08/2017   INR 1.02 11/28/2014   HGBA1C 5.7 (H) 08/08/2017    Dg Finger Index Right  Result Date: 10/14/2017 CLINICAL DATA:  Slammed finger in car door today. Swelling, and pain since. EXAM: RIGHT INDEX FINGER 2+V COMPARISON:  None. FINDINGS: No fracture.  No bone lesion. Joints are normally spaced and aligned.  No arthropathic changes. There is soft tissue swelling. Soft tissue air is seen along the proximal to mid aspect of the finger, mostly along the palmar surface. No radiopaque foreign body. IMPRESSION: 1. No fracture or dislocation. 2. Soft tissue swelling and soft tissue air, the latter consistent with a laceration. No radiopaque foreign body. Electronically Signed   By: Amie Portland M.D.   On: 10/14/2017 08:17    Assessment & Plan:   Diagnoses and all orders for this visit:  Gastroesophageal reflux disease with esophagitis  Other specified hypothyroidism  Vitamin D deficiency  Restless leg syndrome  Obesity, morbid (HCC)     No orders of the defined types were placed in this encounter.    Follow-up: No follow-ups on file.  Sonda Primes, MD

## 2017-12-28 NOTE — Assessment & Plan Note (Signed)
Levothroid 

## 2017-12-29 ENCOUNTER — Ambulatory Visit (INDEPENDENT_AMBULATORY_CARE_PROVIDER_SITE_OTHER): Payer: BLUE CROSS/BLUE SHIELD | Admitting: Family Medicine

## 2017-12-29 VITALS — BP 129/72 | HR 85 | Temp 98.3°F | Ht 64.0 in | Wt 226.0 lb

## 2017-12-29 DIAGNOSIS — E559 Vitamin D deficiency, unspecified: Secondary | ICD-10-CM

## 2017-12-29 DIAGNOSIS — Z9189 Other specified personal risk factors, not elsewhere classified: Secondary | ICD-10-CM

## 2017-12-29 DIAGNOSIS — Z6838 Body mass index (BMI) 38.0-38.9, adult: Secondary | ICD-10-CM

## 2017-12-29 DIAGNOSIS — R7303 Prediabetes: Secondary | ICD-10-CM | POA: Diagnosis not present

## 2017-12-29 MED ORDER — CALCITRIOL 0.25 MCG PO CAPS
0.2500 ug | ORAL_CAPSULE | Freq: Two times a day (BID) | ORAL | 0 refills | Status: DC
Start: 2017-12-29 — End: 2018-02-02

## 2017-12-29 MED ORDER — METFORMIN HCL 500 MG PO TABS: 500.0000 mg | ORAL_TABLET | Freq: Every day | ORAL | 0 refills | Status: DC

## 2017-12-29 NOTE — Progress Notes (Signed)
Office: [phone]  /  Fax: [phone]   HPI:   Chief Complaint: OBESITY Tiffany Medina is here to discuss her progress with her obesity treatment plan. She is on the portion control better and make smarter food choices plan and is following her eating plan approximately 75 % of the time. She states she is exercising 0 minutes 0 times per week. Tiffany Medina continues to do well with weight loss. She is planning meals ahead well and has strategies for dealing with emotional eating and work sabotage. Hunger is controlled. Her weight is 226 lb (102.5 kg) today and has had a weight loss of 1 pound over a period of 4 weeks since her last visit. She has lost 36 lbs since starting treatment with [place].  Pre-Diabetes Tiffany Medina has a diagnosis of prediabetes based on her elevated Hgb A1c and was informed this puts her at greater risk of developing diabetes. She is stable on metformin and she is doing well on her diet. Tiffany Medina continues to work on diet and exercise to decrease risk of diabetes. She denies nausea, vomiting or hypoglycemia.  At risk for diabetes Tiffany Medina is at higher than average risk for developing diabetes due to her obesity and pre-diabetes. She currently denies polyuria or polydipsia.  Vitamin D deficiency Tiffany Medina has a diagnosis of vitamin D deficiency. She is stable on vit D and denies nausea, vomiting or muscle weakness.  ALLERGIES: Allergies  Allergen Reactions  . Dihydrotachysterol Hives, Itching and Rash  . Vitamin D Analogs Hives, Itching and Rash    Flushing    MEDICATIONS: Current Outpatient Medications on File Prior to Visit  Medication Sig Dispense Refill  . Albuterol Sulfate (PROAIR RESPICLICK) 108 (90 Base) MCG/ACT AEPB Inhale 1-2 puffs into the lungs 4 (four) times daily as needed. 3 each 0  . ALPRAZolam (XANAX) 0.5 MG tablet TAKE 1 OR 2 TABLETS BY MOUTH TWICE DAILY AS NEEDED FOR insomnia, anxiety 90 tablet 2  . Armodafinil 200 MG TABS Take 1 tablet by mouth daily.  90 tablet 1  . aspirin (GOODSENSE ASPIRIN) 325 MG tablet Take 1 tablet (325 mg total) by mouth 2 (two) times daily. 100 tablet 3  . Black Cohosh 540 MG CAPS 1 po qhs 90 capsule 3  . buPROPion (WELLBUTRIN XL) 150 MG 24 hr tablet Take 1 tablet (150 mg total) by mouth daily. 30 tablet 5  . calcitRIOL (ROCALTROL) 0.25 MCG capsule Take 1 capsule by mouth 2 times daily. 60 capsule 0  . Cyanocobalamin 1000 MCG SUBL Take 1 tablet (1,000 mcg total) by mouth daily. 90 each 3  . diphenhydrAMINE (BENADRYL) 25 MG tablet Take 1 tablet (25 mg total) by mouth every 6 (six) hours as needed for allergies. 100 tablet 3  . gabapentin (NEURONTIN) 300 MG capsule TAKE 1 TO 2 CAPSULES AT BEDTIME FOR NERVE PAIN 270 capsule 3  . levothyroxine (SYNTHROID, LEVOTHROID) 100 MCG tablet Take 1 tablet (100 mcg total) by mouth daily before breakfast. 90 tablet 3  . loratadine (CLARITIN) 10 MG tablet Take 1 tablet (10 mg total) by mouth daily. 90 tablet 1  . metFORMIN (GLUCOPHAGE) 500 MG tablet Take 1 tablet (500 mg total) by mouth daily with breakfast. 30 tablet 0  . ranitidine (ZANTAC) 150 MG tablet Take 1 tablet (150 mg total) by mouth 2 (two) times daily. 180 tablet 3  . traMADol (ULTRAM) 50 MG tablet Take 1 tablet (50 mg total) by mouth 2 (two) times daily. 180 tablet 1   No current facility-administered medications  on file prior to visit.     PAST MEDICAL HISTORY: Past Medical History:  Diagnosis Date  . Ankle pain   . Anxiety   . Asthma   . Back pain   . Chest pain   . Chronic fatigue syndrome   . Constipation   . COPD (chronic obstructive pulmonary disease) (HCC)   . Depression   . Discoid lupus   . DVT (deep venous thrombosis) (HCC)   . Dyspnea   . Family history of breast cancer   . Family history of colon cancer   . GERD (gastroesophageal reflux disease)   . Hot flashes   . Hypothyroidism   . Insomnia   . Leg edema   . Lupus (HCC)   . Neuropathy   . Obesity   . Sciatica   . Sleep apnea     PAST  SURGICAL HISTORY: Past Surgical History:  Procedure Laterality Date  . ABDOMINAL HYSTERECTOMY    . APPENDECTOMY    . KNEE ARTHROSCOPY W/ MENISCAL REPAIR    . TUBAL LIGATION  1987  . VEIN LIGATION AND STRIPPING      SOCIAL HISTORY: Social History   Tobacco Use  . Smoking status: Current Every Day Smoker    Packs/day: 1.00    Types: Cigarettes  . Smokeless tobacco: Never Used  Substance Use Topics  . Alcohol use: No    Alcohol/week: 0.0 oz  . Drug use: No    FAMILY HISTORY: Family History  Problem Relation Age of Onset  . Breast cancer Mother 30       bilateral breast cancer  . Hodgkin's lymphoma Father        dx in his 64s  . Lung cancer Father 38  . Lung cancer Sister 34       adenocarcinoma (also a smoker)  . Lupus Maternal Uncle   . Colon cancer Maternal Uncle        dx in his 24s  . Cancer Paternal Uncle        2 paternal uncles with cancer NOS  . Diabetes Maternal Grandmother   . Colon cancer Maternal Grandfather        late 55s  . Diabetes Paternal Grandmother   . Colon cancer Paternal Grandfather 89  . Breast cancer Sister 50  . Cancer Other   . Diabetes Other   . Mental illness Other     ROS: Review of Systems  Constitutional: Positive for weight loss.  Gastrointestinal: Negative for nausea and vomiting.  Genitourinary: Negative for frequency.  Musculoskeletal:       Negative for muscle weakness  Endo/Heme/Allergies: Negative for polydipsia.       Negative for hypoglycemia    PHYSICAL EXAM: Blood pressure 129/72, pulse 85, temperature 98.3 F (36.8 C), temperature source Oral, height 5\' 4"  (1.626 m), weight 226 lb (102.5 kg), SpO2 95 %. Body mass index is 38.79 kg/m. Physical Exam  Constitutional: She is oriented to person, place, and time. She appears well-developed and well-nourished.  Cardiovascular: Normal rate.  Pulmonary/Chest: Effort normal.  Musculoskeletal: Normal range of motion.  Neurological: She is oriented to person, place,  and time.  Skin: Skin is warm and dry.  Psychiatric: She has a normal mood and affect. Her behavior is normal.  Vitals reviewed.   RECENT LABS AND TESTS: BMET    Component Value Date/Time   NA 139 08/08/2017 0844   K 5.2 08/08/2017 0844   CL 98 08/08/2017 0844   CO2 26 08/08/2017 0844  GLUCOSE 87 08/08/2017 0844   GLUCOSE 112 (H) 12/02/2016 1756   BUN 17 08/08/2017 0844   CREATININE 0.72 08/08/2017 0844   CALCIUM 9.6 08/08/2017 0844   GFRNONAA 97 08/08/2017 0844   GFRAA 111 08/08/2017 0844   Lab Results  Component Value Date   HGBA1C 5.7 (H) 08/08/2017   HGBA1C 6.0 (H) 04/11/2017   Lab Results  Component Value Date   INSULIN 11.5 08/08/2017   INSULIN 23.7 04/11/2017   CBC    Component Value Date/Time   WBC 7.4 04/11/2017 1040   WBC 6.0 12/02/2016 1756   RBC 4.51 04/11/2017 1040   RBC 4.20 12/02/2016 1756   HGB 13.9 04/11/2017 1040   HGB 14.4 12/25/2010 1504   HCT 41.6 04/11/2017 1040   HCT 41.5 12/25/2010 1504   PLT 316 12/02/2016 1756   PLT 329 12/25/2010 1504   MCV 92 04/11/2017 1040   MCV 94.7 12/25/2010 1504   MCH 30.8 04/11/2017 1040   MCH 31.7 12/02/2016 1756   MCHC 33.4 04/11/2017 1040   MCHC 33.3 12/02/2016 1756   RDW 13.6 04/11/2017 1040   RDW 13.0 12/25/2010 1504   LYMPHSABS 1.2 04/11/2017 1040   LYMPHSABS 1.7 12/25/2010 1504   MONOABS 0.5 12/02/2016 1756   MONOABS 0.8 12/25/2010 1504   EOSABS 0.2 04/11/2017 1040   BASOSABS 0.0 04/11/2017 1040   BASOSABS 0.0 12/25/2010 1504   Iron/TIBC/Ferritin/ %Sat No results found for: IRON, TIBC, FERRITIN, IRONPCTSAT Lipid Panel     Component Value Date/Time   CHOL 164 08/08/2017 0844   TRIG 106 08/08/2017 0844   HDL 46 08/08/2017 0844   CHOLHDL 3 03/26/2016 1704   VLDL 23.0 03/26/2016 1704   LDLCALC 97 08/08/2017 0844   Hepatic Function Panel     Component Value Date/Time   PROT 6.6 08/08/2017 0844   ALBUMIN 4.4 08/08/2017 0844   AST 16 08/08/2017 0844   ALT 21 08/08/2017 0844   ALKPHOS  80 08/08/2017 0844   BILITOT 0.2 08/08/2017 0844   BILIDIR 0.0 03/26/2016 1704      Component Value Date/Time   TSH 0.600 08/08/2017 0844   TSH 0.399 (L) 04/11/2017 1040   TSH 0.58 06/18/2016 1153   Results for DEUNDRA, FURBER (MRN [id number]) as of 12/29/2017 08:09  Ref. Range 08/08/2017 08:44  Vitamin D, 25-Hydroxy Latest Ref Range: 30.0 - 100.0 ng/mL 28.5 (L)   ASSESSMENT AND PLAN: Prediabetes - Plan: metFORMIN (GLUCOPHAGE) 500 MG tablet  Vitamin D deficiency - Plan: calcitRIOL (ROCALTROL) 0.25 MCG capsule  At risk for diabetes mellitus  Class 2 severe obesity with serious comorbidity and body mass index (BMI) of 38.0 to 38.9 in adult, unspecified obesity type (HCC)  PLAN:  Pre-Diabetes Lisabeth will continue to work on weight loss, exercise, and decreasing simple carbohydrates in her diet to help decrease the risk of diabetes. We dicussed metformin including benefits and risks. She was informed that eating too many simple carbohydrates or too many calories at one sitting increases the likelihood of GI side effects. Ray requested metformin for now and a prescription was written today for 1 month refill. We will recheck labs in 1 month and Brent agreed to follow up with [place] as directed to monitor her progress.  Diabetes risk counseling Kaleiyah was given extended (15 minutes) diabetes prevention counseling today. She is 52 y.o. female and has risk factors for diabetes including obesity and pre-diabetes. We discussed intensive lifestyle modifications today with an emphasis on weight loss as well as increasing exercise and  decreasing simple carbohydrates in her diet.  Vitamin D Deficiency Cala BradfordKimberly was informed that low vitamin D levels contributes to fatigue and are associated with obesity, breast, and colon cancer. She agrees to continue to take Calcitriol 0.25 mcg two times daily #60 with no refills and will follow up for routine testing of vitamin D, at least 2-3 times per year.  She was informed of the risk of over-replacement of vitamin D and agrees to not increase her dose unless she discusses this with us first. Cala BradfordKimberly agrees to follow up as directed.  Obesity Cala BradfordKimberly is currently in the action stage of change. As such, her goal is to continue with weight loss efforts She has agreed to follow the Category 3 plan Cala BradfordKimberly has been instructed to work up to a goal of 150 minutes of combined cardio and strengthening exercise per week for weight loss and overall health benefits. We discussed the following Behavioral Modification Strategies today: work on meal planning and easy cooking plans and dealing with family or coworker sabotage  Cala BradfordKimberly has agreed to follow up with our clinic in 3 to 4 weeks. She was informed of the importance of frequent follow up visits to maximize her success with intensive lifestyle modifications for her multiple health conditions.   OBESITY BEHAVIORAL INTERVENTION VISIT  Today's visit was # 11 out of 22.  Starting weight: 262 lbs Starting date: 04/11/17 Today's weight : 226 lbs Today's date: 12/29/2017 Total lbs lost to date: 4236 (Patients must lose 7 lbs in the first 6 months to continue with counseling)   ASK: We discussed the diagnosis of obesity with Denzil MagnusonKimberly Szwed today and Cala BradfordKimberly agreed to give us permission to discuss obesity behavioral modification therapy today.  ASSESS: Cala BradfordKimberly has the diagnosis of obesity and her BMI today is 38.77 Cala BradfordKimberly is in the action stage of change   ADVISE: Cala BradfordKimberly was educated on the multiple health risks of obesity as well as the benefit of weight loss to improve her health. She was advised of the need for long term treatment and the importance of lifestyle modifications.  AGREE: Multiple dietary modification options and treatment options were discussed and  Cala BradfordKimberly agreed to the above obesity treatment plan.  I, [place] CraneJoanne Murray, am acting as transcriptionist for Quillian Quincearen Demeco Ducksworth, MD  I  have reviewed the above documentation for accuracy and completeness, and I agree with the above. -Quillian Quincearen Malvika Tung, MD

## 2018-01-30 ENCOUNTER — Telehealth (INDEPENDENT_AMBULATORY_CARE_PROVIDER_SITE_OTHER): Payer: Self-pay | Admitting: Family Medicine

## 2018-01-30 ENCOUNTER — Encounter (INDEPENDENT_AMBULATORY_CARE_PROVIDER_SITE_OTHER): Payer: Self-pay

## 2018-01-30 ENCOUNTER — Ambulatory Visit (INDEPENDENT_AMBULATORY_CARE_PROVIDER_SITE_OTHER): Payer: BLUE CROSS/BLUE SHIELD | Admitting: Family Medicine

## 2018-01-30 NOTE — Telephone Encounter (Signed)
Patient missed her 7/29 fasting appt due to car trouble.  She is rescheduled to see Dr. Rinaldo RatelKadolph Thursday, 8/1.  Patient states she is out of her Vit D.  She uses [facility]. Thank you

## 2018-01-31 DIAGNOSIS — Z1212 Encounter for screening for malignant neoplasm of rectum: Secondary | ICD-10-CM | POA: Diagnosis not present

## 2018-01-31 DIAGNOSIS — Z801 Family history of malignant neoplasm of trachea, bronchus and lung: Secondary | ICD-10-CM | POA: Diagnosis not present

## 2018-01-31 DIAGNOSIS — Z01419 Encounter for gynecological examination (general) (routine) without abnormal findings: Secondary | ICD-10-CM | POA: Diagnosis not present

## 2018-01-31 DIAGNOSIS — Z803 Family history of malignant neoplasm of breast: Secondary | ICD-10-CM | POA: Diagnosis not present

## 2018-01-31 DIAGNOSIS — Z1231 Encounter for screening mammogram for malignant neoplasm of breast: Secondary | ICD-10-CM | POA: Diagnosis not present

## 2018-01-31 DIAGNOSIS — Z6839 Body mass index (BMI) 39.0-39.9, adult: Secondary | ICD-10-CM | POA: Diagnosis not present

## 2018-01-31 NOTE — Telephone Encounter (Signed)
Will refill this medication on her next office visit. Amoree Newlon, CMA

## 2018-02-02 ENCOUNTER — Ambulatory Visit (INDEPENDENT_AMBULATORY_CARE_PROVIDER_SITE_OTHER): Payer: BLUE CROSS/BLUE SHIELD | Admitting: Family Medicine

## 2018-02-02 VITALS — BP 121/71 | HR 85 | Temp 98.1°F | Ht 64.0 in | Wt 223.0 lb

## 2018-02-02 DIAGNOSIS — Z9189 Other specified personal risk factors, not elsewhere classified: Secondary | ICD-10-CM

## 2018-02-02 DIAGNOSIS — E559 Vitamin D deficiency, unspecified: Secondary | ICD-10-CM | POA: Diagnosis not present

## 2018-02-02 DIAGNOSIS — Z6838 Body mass index (BMI) 38.0-38.9, adult: Secondary | ICD-10-CM

## 2018-02-02 DIAGNOSIS — R7303 Prediabetes: Secondary | ICD-10-CM | POA: Diagnosis not present

## 2018-02-02 MED ORDER — CALCITRIOL 0.25 MCG PO CAPS
0.2500 ug | ORAL_CAPSULE | Freq: Two times a day (BID) | ORAL | 0 refills | Status: DC
Start: 2018-02-02 — End: 2018-03-24

## 2018-02-02 MED ORDER — METFORMIN HCL 500 MG PO TABS: 500.0000 mg | ORAL_TABLET | Freq: Every day | ORAL | 0 refills | Status: DC

## 2018-02-02 NOTE — Progress Notes (Signed)
Office: [phone]  /  Fax: [phone]   HPI:   Chief Complaint: OBESITY Tiffany Medina is here to discuss her progress with her obesity treatment plan. She is on the Category 3 plan and is following her eating plan approximately 75 % of the time. She states she is exercising 0 minutes 0 times per week. Tiffany Medina is trying to do her best to make good decisions because work has been very demanding.  Her weight is 223 lb (101.2 kg) today and has had a weight loss of 3 pounds over a period of 5 weeks since her last visit. She has lost 39 lbs since starting treatment with [place].  Pre-Diabetes Tiffany Medina has a diagnosis of pre-diabetes based on her elevated Hgb A1c and was informed this puts her at greater risk of developing diabetes. She is taking metformin currently, notes occasional carbohydrate cravings. She continues to work on diet and exercise to decrease risk of diabetes. She denies nausea or hypoglycemia.  At risk for diabetes Tiffany Medina is at higher than average risk for developing diabetes due to her obesity and pre-diabetes. She currently denies polyuria or polydipsia.  Vitamin D Deficiency Tiffany Medina has a diagnosis of vitamin D deficiency. She is currently taking calcitriol. She notes fatigue and denies nausea, vomiting or muscle weakness.  ALLERGIES: Allergies  Allergen Reactions  . Dihydrotachysterol Hives, Itching and Rash  . Vitamin D Analogs Hives, Itching and Rash    Flushing    MEDICATIONS: Current Outpatient Medications on File Prior to Visit  Medication Sig Dispense Refill  . Albuterol Sulfate (PROAIR RESPICLICK) 108 (90 Base) MCG/ACT AEPB Inhale 1-2 puffs into the lungs 4 (four) times daily as needed. 3 each 0  . ALPRAZolam (XANAX) 0.5 MG tablet TAKE 1 OR 2 TABLETS BY MOUTH TWICE DAILY AS NEEDED FOR insomnia, anxiety 90 tablet 2  . Armodafinil 200 MG TABS Take 1 tablet by mouth daily. 90 tablet 1  . aspirin (GOODSENSE ASPIRIN) 325 MG tablet Take 1 tablet (325 mg total) by  mouth 2 (two) times daily. 100 tablet 3  . Black Cohosh 540 MG CAPS 1 po qhs 90 capsule 3  . buPROPion (WELLBUTRIN XL) 150 MG 24 hr tablet Take 1 tablet (150 mg total) by mouth daily. 30 tablet 5  . Cyanocobalamin 1000 MCG SUBL Take 1 tablet (1,000 mcg total) by mouth daily. 90 each 3  . diphenhydrAMINE (BENADRYL) 25 MG tablet Take 1 tablet (25 mg total) by mouth every 6 (six) hours as needed for allergies. 100 tablet 3  . gabapentin (NEURONTIN) 300 MG capsule TAKE 1 TO 2 CAPSULES AT BEDTIME FOR NERVE PAIN 270 capsule 3  . levothyroxine (SYNTHROID, LEVOTHROID) 100 MCG tablet Take 1 tablet (100 mcg total) by mouth daily before breakfast. 90 tablet 3  . loratadine (CLARITIN) 10 MG tablet Take 1 tablet (10 mg total) by mouth daily. 90 tablet 1  . ranitidine (ZANTAC) 150 MG tablet Take 1 tablet (150 mg total) by mouth 2 (two) times daily. 180 tablet 3  . traMADol (ULTRAM) 50 MG tablet Take 1 tablet (50 mg total) by mouth 2 (two) times daily. 180 tablet 1   No current facility-administered medications on file prior to visit.     PAST MEDICAL HISTORY: Past Medical History:  Diagnosis Date  . Ankle pain   . Anxiety   . Asthma   . Back pain   . Chest pain   . Chronic fatigue syndrome   . Constipation   . COPD (chronic obstructive pulmonary disease) (  HCC)   . Depression   . Discoid lupus   . DVT (deep venous thrombosis) (HCC)   . Dyspnea   . Family history of breast cancer   . Family history of colon cancer   . GERD (gastroesophageal reflux disease)   . Hot flashes   . Hypothyroidism   . Insomnia   . Leg edema   . Lupus (HCC)   . Neuropathy   . Obesity   . Sciatica   . Sleep apnea     PAST SURGICAL HISTORY: Past Surgical History:  Procedure Laterality Date  . ABDOMINAL HYSTERECTOMY    . APPENDECTOMY    . KNEE ARTHROSCOPY W/ MENISCAL REPAIR    . TUBAL LIGATION  1987  . VEIN LIGATION AND STRIPPING      SOCIAL HISTORY: Social History   Tobacco Use  . Smoking status:  Current Every Day Smoker    Packs/day: 1.00    Types: Cigarettes  . Smokeless tobacco: Never Used  Substance Use Topics  . Alcohol use: No    Alcohol/week: 0.0 oz  . Drug use: No    FAMILY HISTORY: Family History  Problem Relation Age of Onset  . Breast cancer Mother 40       bilateral breast cancer  . Hodgkin's lymphoma Father        dx in his 38s  . Lung cancer Father 62  . Lung cancer Sister 42       adenocarcinoma (also a smoker)  . Lupus Maternal Uncle   . Colon cancer Maternal Uncle        dx in his 66s  . Cancer Paternal Uncle        2 paternal uncles with cancer NOS  . Diabetes Maternal Grandmother   . Colon cancer Maternal Grandfather        late 71s  . Diabetes Paternal Grandmother   . Colon cancer Paternal Grandfather 61  . Breast cancer Sister 81  . Cancer Other   . Diabetes Other   . Mental illness Other     ROS: Review of Systems  Constitutional: Positive for malaise/fatigue and weight loss.  Gastrointestinal: Negative for nausea and vomiting.  Genitourinary: Negative for frequency.  Musculoskeletal:       Negative muscle weakness  Endo/Heme/Allergies: Negative for polydipsia.       Negative hypoglycemia    PHYSICAL EXAM: Blood pressure 121/71, pulse 85, temperature 98.1 F (36.7 C), temperature source Oral, height 5\' 4"  (1.626 m), weight 223 lb (101.2 kg), SpO2 98 %. Body mass index is 38.28 kg/m. Physical Exam  Constitutional: She is oriented to person, place, and time. She appears well-developed and well-nourished.  Cardiovascular: Normal rate.  Pulmonary/Chest: Effort normal.  Musculoskeletal: Normal range of motion.  Neurological: She is oriented to person, place, and time.  Skin: Skin is warm and dry.  Psychiatric: She has a normal mood and affect. Her behavior is normal.  Vitals reviewed.   RECENT LABS AND TESTS: BMET    Component Value Date/Time   NA 139 08/08/2017 0844   K 5.2 08/08/2017 0844   CL 98 08/08/2017 0844   CO2 26  08/08/2017 0844   GLUCOSE 87 08/08/2017 0844   GLUCOSE 112 (H) 12/02/2016 1756   BUN 17 08/08/2017 0844   CREATININE 0.72 08/08/2017 0844   CALCIUM 9.6 08/08/2017 0844   GFRNONAA 97 08/08/2017 0844   GFRAA 111 08/08/2017 0844   Lab Results  Component Value Date   HGBA1C 5.7 (H) 08/08/2017  HGBA1C 6.0 (H) 04/11/2017   Lab Results  Component Value Date   INSULIN 11.5 08/08/2017   INSULIN 23.7 04/11/2017   CBC    Component Value Date/Time   WBC 7.4 04/11/2017 1040   WBC 6.0 12/02/2016 1756   RBC 4.51 04/11/2017 1040   RBC 4.20 12/02/2016 1756   HGB 13.9 04/11/2017 1040   HGB 14.4 12/25/2010 1504   HCT 41.6 04/11/2017 1040   HCT 41.5 12/25/2010 1504   PLT 316 12/02/2016 1756   PLT 329 12/25/2010 1504   MCV 92 04/11/2017 1040   MCV 94.7 12/25/2010 1504   MCH 30.8 04/11/2017 1040   MCH 31.7 12/02/2016 1756   MCHC 33.4 04/11/2017 1040   MCHC 33.3 12/02/2016 1756   RDW 13.6 04/11/2017 1040   RDW 13.0 12/25/2010 1504   LYMPHSABS 1.2 04/11/2017 1040   LYMPHSABS 1.7 12/25/2010 1504   MONOABS 0.5 12/02/2016 1756   MONOABS 0.8 12/25/2010 1504   EOSABS 0.2 04/11/2017 1040   BASOSABS 0.0 04/11/2017 1040   BASOSABS 0.0 12/25/2010 1504   Iron/TIBC/Ferritin/ %Sat No results found for: IRON, TIBC, FERRITIN, IRONPCTSAT Lipid Panel     Component Value Date/Time   CHOL 164 08/08/2017 0844   TRIG 106 08/08/2017 0844   HDL 46 08/08/2017 0844   CHOLHDL 3 03/26/2016 1704   VLDL 23.0 03/26/2016 1704   LDLCALC 97 08/08/2017 0844   Hepatic Function Panel     Component Value Date/Time   PROT 6.6 08/08/2017 0844   ALBUMIN 4.4 08/08/2017 0844   AST 16 08/08/2017 0844   ALT 21 08/08/2017 0844   ALKPHOS 80 08/08/2017 0844   BILITOT 0.2 08/08/2017 0844   BILIDIR 0.0 03/26/2016 1704      Component Value Date/Time   TSH 0.600 08/08/2017 0844   TSH 0.399 (L) 04/11/2017 1040   TSH 0.58 06/18/2016 1153  Results for Tiffany MagnusonCREED, Tiffany Medina (MRN [id number]) as of 02/02/2018 13:10  Ref. Range  08/08/2017 08:44  Vitamin D, 25-Hydroxy Latest Ref Range: 30.0 - 100.0 ng/mL 28.5 (L)    ASSESSMENT AND PLAN: Prediabetes - Plan: Hemoglobin A1c, Insulin, random, T3, T4, free, TSH, metFORMIN (GLUCOPHAGE) 500 MG tablet  Vitamin D deficiency - Plan: VITAMIN D 25 Hydroxy (Vit-D Deficiency, Fractures), calcitRIOL (ROCALTROL) 0.25 MCG capsule  At risk for diabetes mellitus  Class 2 severe obesity with serious comorbidity and body mass index (BMI) of 38.0 to 38.9 in adult, unspecified obesity type (HCC)  PLAN:  Pre-Diabetes Tiffany Medina will continue to work on weight loss, exercise, and decreasing simple carbohydrates in her diet to help decrease the risk of diabetes. We dicussed metformin including benefits and risks. She was informed that eating too many simple carbohydrates or too many calories at one sitting increases the likelihood of GI side effects. Tiffany Medina agrees to continue taking metformin 500 mg PO q AM #30 and we will refill for 1 month. We will check labs today and Tiffany Medina agrees to follow up with our clinic in 2 weeks as directed to monitor her progress.  Diabetes risk counselling Tiffany Medina was given extended (15 minutes) diabetes prevention counseling today. She is 52 y.o. female and has risk factors for diabetes including obesity and pre-diabetes. We discussed intensive lifestyle modifications today with an emphasis on weight loss as well as increasing exercise and decreasing simple carbohydrates in her diet.  Vitamin D Deficiency Tiffany Medina was informed that low vitamin D levels contributes to fatigue and are associated with obesity, breast, and colon cancer. Tiffany Medina agrees to continue taking calcitriol  0.25 mcg PO BID #60 and we will refill for 1 month. She will follow up for routine testing of vitamin D, at least 2-3 times per year. She was informed of the risk of over-replacement of vitamin D and agrees to not increase her dose unless she discusses this with [place] first. We will check  labs today and Tiffany Medina agrees to follow up with our clinic in 2 weeks.  Obesity Tiffany Medina is currently in the action stage of change. As such, her goal is to continue with weight loss efforts She has agreed to follow the Category 3 plan and follow our protein rich vegetarian plan Tiffany Medina has been instructed to work up to a goal of 150 minutes of combined cardio and strengthening exercise per week or start activity 2-3 times per weeks for 5 minutes for weight loss and overall health benefits. We discussed the following Behavioral Modification Strategies today: increasing lean protein intake, increasing vegetables, work on meal planning and easy cooking plans, and planning for success   Kiyona has agreed to follow up with our clinic in 2 weeks. She was informed of the importance of frequent follow up visits to maximize her success with intensive lifestyle modifications for her multiple health conditions.   OBESITY BEHAVIORAL INTERVENTION VISIT  Today's visit was # 12 out of 22.  Starting weight: 262 lbs Starting date: 04/11/17 Today's weight : 223 lbs  Today's date: 02/02/2018 Total lbs lost to date: 82    ASK: We discussed the diagnosis of obesity with Tiffany Medina today and Talesha agreed to give [place] permission to discuss obesity behavioral modification therapy today.  ASSESS: Tiffany Medina has the diagnosis of obesity and her BMI today is 38.26 Tiffany Medina is in the action stage of change   ADVISE: Tiffany Medina was educated on the multiple health risks of obesity as well as the benefit of weight loss to improve her health. She was advised of the need for long term treatment and the importance of lifestyle modifications.  AGREE: Multiple dietary modification options and treatment options were discussed and  Tiffany Medina agreed to the above obesity treatment plan.  I, Burt Knack, am acting as transcriptionist for Debbra Riding, MD  I have reviewed the above documentation for accuracy  and completeness, and I agree with the above. - Debbra Riding, MD

## 2018-02-03 LAB — T3: T3, Total: 135 ng/dL (ref 71–180)

## 2018-02-03 LAB — HEMOGLOBIN A1C
ESTIMATED AVERAGE GLUCOSE: 114 mg/dL
HEMOGLOBIN A1C: 5.6 % (ref 4.8–5.6)

## 2018-02-03 LAB — VITAMIN D 25 HYDROXY (VIT D DEFICIENCY, FRACTURES): VIT D 25 HYDROXY: 25.2 ng/mL — AB (ref 30.0–100.0)

## 2018-02-03 LAB — T4, FREE: Free T4: 1.48 ng/dL (ref 0.82–1.77)

## 2018-02-03 LAB — INSULIN, RANDOM: INSULIN: 12.3 u[IU]/mL (ref 2.6–24.9)

## 2018-02-03 LAB — TSH: TSH: 0.514 u[IU]/mL (ref 0.450–4.500)

## 2018-02-27 ENCOUNTER — Ambulatory Visit (INDEPENDENT_AMBULATORY_CARE_PROVIDER_SITE_OTHER): Payer: Self-pay | Admitting: Family Medicine

## 2018-03-08 ENCOUNTER — Other Ambulatory Visit (INDEPENDENT_AMBULATORY_CARE_PROVIDER_SITE_OTHER): Payer: Self-pay | Admitting: Family Medicine

## 2018-03-08 ENCOUNTER — Encounter (INDEPENDENT_AMBULATORY_CARE_PROVIDER_SITE_OTHER): Payer: Self-pay

## 2018-03-08 ENCOUNTER — Telehealth (INDEPENDENT_AMBULATORY_CARE_PROVIDER_SITE_OTHER): Payer: Self-pay | Admitting: Family Medicine

## 2018-03-08 DIAGNOSIS — E559 Vitamin D deficiency, unspecified: Secondary | ICD-10-CM

## 2018-03-08 DIAGNOSIS — R7303 Prediabetes: Secondary | ICD-10-CM

## 2018-03-08 NOTE — Telephone Encounter (Signed)
Sent the patient a my chart message. April, CMA

## 2018-03-08 NOTE — Telephone Encounter (Signed)
Pt called 9/4 to r/s appt for 9/10 due to work issues; needs refill on Metformin and Vitamin D. drh

## 2018-03-14 ENCOUNTER — Encounter (INDEPENDENT_AMBULATORY_CARE_PROVIDER_SITE_OTHER): Payer: Self-pay

## 2018-03-14 ENCOUNTER — Ambulatory Visit (INDEPENDENT_AMBULATORY_CARE_PROVIDER_SITE_OTHER): Payer: Self-pay | Admitting: Family Medicine

## 2018-03-16 DIAGNOSIS — Z809 Family history of malignant neoplasm, unspecified: Secondary | ICD-10-CM | POA: Diagnosis not present

## 2018-03-24 ENCOUNTER — Encounter: Payer: Self-pay | Admitting: Internal Medicine

## 2018-03-24 ENCOUNTER — Ambulatory Visit: Payer: BLUE CROSS/BLUE SHIELD | Admitting: Internal Medicine

## 2018-03-24 VITALS — BP 124/72 | HR 87 | Temp 98.0°F | Ht 64.0 in | Wt 236.0 lb

## 2018-03-24 DIAGNOSIS — G4733 Obstructive sleep apnea (adult) (pediatric): Secondary | ICD-10-CM

## 2018-03-24 DIAGNOSIS — K2100 Gastro-esophageal reflux disease with esophagitis, without bleeding: Secondary | ICD-10-CM

## 2018-03-24 DIAGNOSIS — E559 Vitamin D deficiency, unspecified: Secondary | ICD-10-CM

## 2018-03-24 DIAGNOSIS — Z6838 Body mass index (BMI) 38.0-38.9, adult: Secondary | ICD-10-CM

## 2018-03-24 DIAGNOSIS — L930 Discoid lupus erythematosus: Secondary | ICD-10-CM

## 2018-03-24 DIAGNOSIS — Z716 Tobacco abuse counseling: Secondary | ICD-10-CM

## 2018-03-24 DIAGNOSIS — E038 Other specified hypothyroidism: Secondary | ICD-10-CM | POA: Diagnosis not present

## 2018-03-24 MED ORDER — LEVOTHYROXINE SODIUM 100 MCG PO TABS: 100.0000 ug | ORAL_TABLET | Freq: Every day | ORAL | 3 refills | Status: DC

## 2018-03-24 MED ORDER — CALCITRIOL 0.25 MCG PO CAPS: 0.2500 ug | ORAL_CAPSULE | Freq: Two times a day (BID) | ORAL | 0 refills | Status: DC

## 2018-03-24 MED ORDER — ARMODAFINIL 200 MG PO TABS
1.0000 | ORAL_TABLET | Freq: Every day | ORAL | 1 refills | Status: DC
Start: 2018-03-24 — End: 2018-06-22

## 2018-03-24 MED ORDER — LORATADINE 10 MG PO TABS: 10.0000 mg | ORAL_TABLET | Freq: Every day | ORAL | 1 refills | Status: DC

## 2018-03-24 MED ORDER — CYANOCOBALAMIN 1000 MCG SL SUBL
1.0000 | SUBLINGUAL_TABLET | Freq: Every day | SUBLINGUAL | 3 refills | Status: DC
Start: 2018-03-24 — End: 2018-08-21

## 2018-03-24 MED ORDER — ASPIRIN 325 MG PO TABS: 325.0000 mg | ORAL_TABLET | Freq: Two times a day (BID) | ORAL | 3 refills | Status: DC

## 2018-03-24 MED ORDER — BLACK COHOSH 540 MG PO CAPS: ORAL_CAPSULE | ORAL | 3 refills | Status: DC

## 2018-03-24 MED ORDER — TRAMADOL HCL 50 MG PO TABS: 50.0000 mg | ORAL_TABLET | Freq: Two times a day (BID) | ORAL | 1 refills | Status: DC

## 2018-03-24 MED ORDER — ALPRAZOLAM 0.5 MG PO TABS: ORAL_TABLET | ORAL | 2 refills | Status: DC

## 2018-03-24 NOTE — Assessment & Plan Note (Signed)
Levothroid 

## 2018-03-24 NOTE — Assessment & Plan Note (Signed)
Worse  Keep ROV w/Dr Dalbert GarnetBeasley

## 2018-03-24 NOTE — Assessment & Plan Note (Signed)
Chronic. 

## 2018-03-24 NOTE — Assessment & Plan Note (Signed)
Protonix bid Zantac bid

## 2018-03-24 NOTE — Assessment & Plan Note (Signed)
Stable

## 2018-03-24 NOTE — Assessment & Plan Note (Signed)
Provigyl CPAP

## 2018-03-24 NOTE — Progress Notes (Signed)
Subjective:  Patient ID: Tiffany Medina, female    DOB: 1965/09/09  Age: 52 y.o. MRN: [id number]  CC: No chief complaint on file.   HPI Tiffany Medina presents for ADD, DM, OA, HTN f/u  Outpatient Medications Prior to Visit  Medication Sig Dispense Refill  . Albuterol Sulfate (PROAIR RESPICLICK) 108 (90 Base) MCG/ACT AEPB Inhale 1-2 puffs into the lungs 4 (four) times daily as needed. 3 each 0  . ALPRAZolam (XANAX) 0.5 MG tablet TAKE 1 OR 2 TABLETS BY MOUTH TWICE DAILY AS NEEDED FOR insomnia, anxiety 90 tablet 2  . Armodafinil 200 MG TABS Take 1 tablet by mouth daily. 90 tablet 1  . aspirin (GOODSENSE ASPIRIN) 325 MG tablet Take 1 tablet (325 mg total) by mouth 2 (two) times daily. 100 tablet 3  . Black Cohosh 540 MG CAPS 1 po qhs 90 capsule 3  . buPROPion (WELLBUTRIN XL) 150 MG 24 hr tablet Take 1 tablet (150 mg total) by mouth daily. 30 tablet 5  . calcitRIOL (ROCALTROL) 0.25 MCG capsule Take 1 capsule (0.25 mcg total) by mouth 2 (two) times daily. 60 capsule 0  . Cyanocobalamin 1000 MCG SUBL Take 1 tablet (1,000 mcg total) by mouth daily. 90 each 3  . diphenhydrAMINE (BENADRYL) 25 MG tablet Take 1 tablet (25 mg total) by mouth every 6 (six) hours as needed for allergies. 100 tablet 3  . gabapentin (NEURONTIN) 300 MG capsule TAKE 1 TO 2 CAPSULES AT BEDTIME FOR NERVE PAIN 270 capsule 3  . levothyroxine (SYNTHROID, LEVOTHROID) 100 MCG tablet Take 1 tablet (100 mcg total) by mouth daily before breakfast. 90 tablet 3  . loratadine (CLARITIN) 10 MG tablet Take 1 tablet (10 mg total) by mouth daily. 90 tablet 1  . metFORMIN (GLUCOPHAGE) 500 MG tablet Take 1 tablet (500 mg total) by mouth daily with breakfast. 30 tablet 0  . ranitidine (ZANTAC) 150 MG tablet Take 1 tablet (150 mg total) by mouth 2 (two) times daily. 180 tablet 3  . traMADol (ULTRAM) 50 MG tablet Take 1 tablet (50 mg total) by mouth 2 (two) times daily. 180 tablet 1   No facility-administered medications prior to visit.      ROS: Review of Systems  Constitutional: Positive for fatigue and unexpected weight change. Negative for activity change, appetite change and chills.  HENT: Negative for congestion, mouth sores and sinus pressure.   Eyes: Negative for visual disturbance.  Respiratory: Negative for cough and chest tightness.   Gastrointestinal: Negative for abdominal pain and nausea.  Genitourinary: Negative for difficulty urinating, frequency and vaginal pain.  Musculoskeletal: Positive for arthralgias. Negative for back pain and gait problem.  Skin: Negative for pallor and rash.  Neurological: Negative for dizziness, tremors, weakness, numbness and headaches.  Psychiatric/Behavioral: Positive for dysphoric mood. Negative for confusion, sleep disturbance and suicidal ideas. The patient is nervous/anxious.     Objective:  BP 124/72 (BP Location: Left Arm, Patient Position: Sitting, Cuff Size: Large)   Pulse 87   Temp 98 F (36.7 C) (Oral)   Ht 5\' 4"  (1.626 m)   Wt 236 lb (107 kg)   SpO2 99%   BMI 40.51 kg/m   BP Readings from Last 3 Encounters:  03/24/18 124/72  02/02/18 121/71  12/29/17 129/72    Wt Readings from Last 3 Encounters:  03/24/18 236 lb (107 kg)  02/02/18 223 lb (101.2 kg)  12/29/17 226 lb (102.5 kg)    Physical Exam  Constitutional: She appears well-developed. No distress.  HENT:  Head: Normocephalic.  Right Ear: External ear normal.  Left Ear: External ear normal.  Nose: Nose normal.  Mouth/Throat: Oropharynx is clear and moist.  Eyes: Pupils are equal, round, and reactive to light. Conjunctivae are normal. Right eye exhibits no discharge. Left eye exhibits no discharge.  Neck: Normal range of motion. Neck supple. No JVD present. No tracheal deviation present. No thyromegaly present.  Cardiovascular: Normal rate, regular rhythm and normal heart sounds.  Pulmonary/Chest: No stridor. No respiratory distress. She has no wheezes.  Abdominal: Soft. Bowel sounds are  normal. She exhibits no distension and no mass. There is no tenderness. There is no rebound and no guarding.  Musculoskeletal: She exhibits tenderness. She exhibits no edema.  Lymphadenopathy:    She has no cervical adenopathy.  Neurological: She displays normal reflexes. No cranial nerve deficit. She exhibits normal muscle tone. Coordination normal.  Skin: No rash noted. No erythema.  Psychiatric: She has a normal mood and affect. Her behavior is normal. Judgment and thought content normal.  R thumb nodules Obese  Lab Results  Component Value Date   WBC 7.4 04/11/2017   HGB 13.9 04/11/2017   HCT 41.6 04/11/2017   PLT 316 12/02/2016   GLUCOSE 87 08/08/2017   CHOL 164 08/08/2017   TRIG 106 08/08/2017   HDL 46 08/08/2017   LDLCALC 97 08/08/2017   ALT 21 08/08/2017   AST 16 08/08/2017   NA 139 08/08/2017   K 5.2 08/08/2017   CL 98 08/08/2017   CREATININE 0.72 08/08/2017   BUN 17 08/08/2017   CO2 26 08/08/2017   TSH 0.514 02/02/2018   INR 1.02 11/28/2014   HGBA1C 5.6 02/02/2018    Dg Finger Index Right  Result Date: 10/14/2017 CLINICAL DATA:  Slammed finger in car door today. Swelling, and pain since. EXAM: RIGHT INDEX FINGER 2+V COMPARISON:  None. FINDINGS: No fracture.  No bone lesion. Joints are normally spaced and aligned.  No arthropathic changes. There is soft tissue swelling. Soft tissue air is seen along the proximal to mid aspect of the finger, mostly along the palmar surface. No radiopaque foreign body. IMPRESSION: 1. No fracture or dislocation. 2. Soft tissue swelling and soft tissue air, the latter consistent with a laceration. No radiopaque foreign body. Electronically Signed   By: Amie Portland M.D.   On: 10/14/2017 08:17    Assessment & Plan:   There are no diagnoses linked to this encounter.   No orders of the defined types were placed in this encounter.    Follow-up: No follow-ups on file.  Sonda Primes, MD

## 2018-03-27 ENCOUNTER — Ambulatory Visit (INDEPENDENT_AMBULATORY_CARE_PROVIDER_SITE_OTHER): Payer: BLUE CROSS/BLUE SHIELD | Admitting: Family Medicine

## 2018-03-27 ENCOUNTER — Encounter (INDEPENDENT_AMBULATORY_CARE_PROVIDER_SITE_OTHER): Payer: Self-pay | Admitting: Family Medicine

## 2018-03-27 VITALS — BP 114/77 | HR 90 | Temp 98.2°F | Ht 64.0 in | Wt 232.0 lb

## 2018-03-27 DIAGNOSIS — Z9189 Other specified personal risk factors, not elsewhere classified: Secondary | ICD-10-CM

## 2018-03-27 DIAGNOSIS — E559 Vitamin D deficiency, unspecified: Secondary | ICD-10-CM | POA: Diagnosis not present

## 2018-03-27 DIAGNOSIS — R7303 Prediabetes: Secondary | ICD-10-CM | POA: Diagnosis not present

## 2018-03-27 DIAGNOSIS — Z6839 Body mass index (BMI) 39.0-39.9, adult: Secondary | ICD-10-CM

## 2018-03-27 MED ORDER — METFORMIN HCL 500 MG PO TABS: 500.0000 mg | ORAL_TABLET | Freq: Every day | ORAL | 0 refills | Status: DC

## 2018-03-27 MED ORDER — CALCITRIOL 0.25 MCG PO CAPS
0.2500 ug | ORAL_CAPSULE | Freq: Two times a day (BID) | ORAL | 0 refills | Status: DC
Start: 2018-03-27 — End: 2018-05-03

## 2018-03-28 NOTE — Progress Notes (Signed)
Office: [phone]  /  Fax: [phone]   HPI:   Chief Complaint: OBESITY Tiffany Medina is here to discuss her progress with her obesity treatment plan. She is on the  follow the Category 3 plan and follow our protein rich vegetarian plan and is following her eating plan approximately 65 % of the time. She states she is exercising 0 minutes 0 times per week. [name] last visit was approximately 2 months ago. She has been working very long hours and not concentrating on her health or eating. She has decreased time for meal planning and has increased comfort eating and snacking. Most of her weight gain is water weight.  Her weight is 232 lb (105.2 kg) today and has not lost weight since her last visit. She has lost 30 lbs since starting treatment with [place].  Pre-Diabetes Tiffany Medina has a diagnosis of prediabetes based on her elevated HgA1c and was informed this puts her at greater risk of developing diabetes. She is taking metformin currently and continues to work on diet and exercise to decrease risk of diabetes. She denies nausea, vomiting or hypoglycemia.She is struggling with the eating plan.   Vitamin D deficiency Tiffany Medina has a diagnosis of vitamin D deficiency. She is currently taking vit D but not yet at goal and denies nausea, vomiting or muscle weakness.  At risk for diabetes Tiffany Medina is at higher than averagerisk for developing diabetes due to her obesity. She currently denies polyuria or polydipsia.  ALLERGIES: Allergies  Allergen Reactions  . Dihydrotachysterol Hives, Itching and Rash  . Vitamin D Analogs Hives, Itching and Rash    Flushing    MEDICATIONS: Current Outpatient Medications on File Prior to Visit  Medication Sig Dispense Refill  . Albuterol Sulfate (PROAIR RESPICLICK) 108 (90 Base) MCG/ACT AEPB Inhale 1-2 puffs into the lungs 4 (four) times daily as needed. 3 each 0  . ALPRAZolam (XANAX) 0.5 MG tablet TAKE 1 OR 2 TABLETS BY MOUTH TWICE DAILY AS NEEDED FOR  insomnia, anxiety 90 tablet 2  . Armodafinil 200 MG TABS Take 1 tablet by mouth daily. 90 tablet 1  . aspirin (GOODSENSE ASPIRIN) 325 MG tablet Take 1 tablet (325 mg total) by mouth 2 (two) times daily. 100 tablet 3  . Black Cohosh 540 MG CAPS 1 po qhs 90 capsule 3  . buPROPion (WELLBUTRIN XL) 150 MG 24 hr tablet Take 1 tablet (150 mg total) by mouth daily. 30 tablet 5  . Cyanocobalamin 1000 MCG SUBL Take 1 tablet (1,000 mcg total) by mouth daily. 90 each 3  . diphenhydrAMINE (BENADRYL) 25 MG tablet Take 1 tablet (25 mg total) by mouth every 6 (six) hours as needed for allergies. 100 tablet 3  . gabapentin (NEURONTIN) 300 MG capsule TAKE 1 TO 2 CAPSULES AT BEDTIME FOR NERVE PAIN 270 capsule 3  . levothyroxine (SYNTHROID, LEVOTHROID) 100 MCG tablet Take 1 tablet (100 mcg total) by mouth daily before breakfast. 90 tablet 3  . loratadine (CLARITIN) 10 MG tablet Take 1 tablet (10 mg total) by mouth daily. 90 tablet 1  . ranitidine (ZANTAC) 150 MG tablet Take 1 tablet (150 mg total) by mouth 2 (two) times daily. 180 tablet 3  . traMADol (ULTRAM) 50 MG tablet Take 1 tablet (50 mg total) by mouth 2 (two) times daily. 180 tablet 1   No current facility-administered medications on file prior to visit.     PAST MEDICAL HISTORY: Past Medical History:  Diagnosis Date  . Ankle pain   . Anxiety   .  Asthma   . Back pain   . Chest pain   . Chronic fatigue syndrome   . Constipation   . COPD (chronic obstructive pulmonary disease) (HCC)   . Depression   . Discoid lupus   . DVT (deep venous thrombosis) (HCC)   . Dyspnea   . Family history of breast cancer   . Family history of colon cancer   . GERD (gastroesophageal reflux disease)   . Hot flashes   . Hypothyroidism   . Insomnia   . Leg edema   . Lupus (HCC)   . Neuropathy   . Obesity   . Sciatica   . Sleep apnea     PAST SURGICAL HISTORY: Past Surgical History:  Procedure Laterality Date  . ABDOMINAL HYSTERECTOMY    . APPENDECTOMY      . KNEE ARTHROSCOPY W/ MENISCAL REPAIR    . TUBAL LIGATION  1987  . VEIN LIGATION AND STRIPPING      SOCIAL HISTORY: Social History   Tobacco Use  . Smoking status: Current Every Day Smoker    Packs/day: 1.00    Types: Cigarettes  . Smokeless tobacco: Never Used  Substance Use Topics  . Alcohol use: No    Alcohol/week: 0.0 standard drinks  . Drug use: No    FAMILY HISTORY: Family History  Problem Relation Age of Onset  . Breast cancer Mother 7       bilateral breast cancer  . Hodgkin's lymphoma Father        dx in his 56s  . Lung cancer Father 77  . Lung cancer Sister 37       adenocarcinoma (also a smoker)  . Lupus Maternal Uncle   . Colon cancer Maternal Uncle        dx in his 57s  . Cancer Paternal Uncle        2 paternal uncles with cancer NOS  . Diabetes Maternal Grandmother   . Colon cancer Maternal Grandfather        late 107s  . Diabetes Paternal Grandmother   . Colon cancer Paternal Grandfather 75  . Breast cancer Sister 86  . Cancer Other   . Diabetes Other   . Mental illness Other     ROS: Review of Systems  Constitutional: Negative for weight loss.  Gastrointestinal: Negative for nausea and vomiting.  Musculoskeletal:       Negative for muscle weakness  Endo/Heme/Allergies: Negative for polydipsia.       Negative for polyuria Negative for hypoglycemia    PHYSICAL EXAM: Blood pressure 114/77, pulse 90, temperature 98.2 F (36.8 C), temperature source Oral, height 5\' 4"  (1.626 m), weight 232 lb (105.2 kg), SpO2 97 %. Body mass index is 39.82 kg/m. Physical Exam  Constitutional: She is oriented to person, place, and time. She appears well-developed and well-nourished.  HENT:  Head: Normocephalic.  Cardiovascular: Normal rate.  Pulmonary/Chest: Effort normal.  Musculoskeletal: Normal range of motion.  Neurological: She is oriented to person, place, and time.  Skin: Skin is warm and dry.  Psychiatric: She has a normal mood and affect. Her  behavior is normal.  Vitals reviewed.   RECENT LABS AND TESTS: BMET    Component Value Date/Time   NA 139 08/08/2017 0844   K 5.2 08/08/2017 0844   CL 98 08/08/2017 0844   CO2 26 08/08/2017 0844   GLUCOSE 87 08/08/2017 0844   GLUCOSE 112 (H) 12/02/2016 1756   BUN 17 08/08/2017 0844   CREATININE 0.72 08/08/2017  0844   CALCIUM 9.6 08/08/2017 0844   GFRNONAA 97 08/08/2017 0844   GFRAA 111 08/08/2017 0844   Lab Results  Component Value Date   HGBA1C 5.6 02/02/2018   HGBA1C 5.7 (H) 08/08/2017   HGBA1C 6.0 (H) 04/11/2017   Lab Results  Component Value Date   INSULIN 12.3 02/02/2018   INSULIN 11.5 08/08/2017   INSULIN 23.7 04/11/2017   CBC    Component Value Date/Time   WBC 7.4 04/11/2017 1040   WBC 6.0 12/02/2016 1756   RBC 4.51 04/11/2017 1040   RBC 4.20 12/02/2016 1756   HGB 13.9 04/11/2017 1040   HGB 14.4 12/25/2010 1504   HCT 41.6 04/11/2017 1040   HCT 41.5 12/25/2010 1504   PLT 316 12/02/2016 1756   PLT 329 12/25/2010 1504   MCV 92 04/11/2017 1040   MCV 94.7 12/25/2010 1504   MCH 30.8 04/11/2017 1040   MCH 31.7 12/02/2016 1756   MCHC 33.4 04/11/2017 1040   MCHC 33.3 12/02/2016 1756   RDW 13.6 04/11/2017 1040   RDW 13.0 12/25/2010 1504   LYMPHSABS 1.2 04/11/2017 1040   LYMPHSABS 1.7 12/25/2010 1504   MONOABS 0.5 12/02/2016 1756   MONOABS 0.8 12/25/2010 1504   EOSABS 0.2 04/11/2017 1040   BASOSABS 0.0 04/11/2017 1040   BASOSABS 0.0 12/25/2010 1504   Iron/TIBC/Ferritin/ %Sat No results found for: IRON, TIBC, FERRITIN, IRONPCTSAT Lipid Panel     Component Value Date/Time   CHOL 164 08/08/2017 0844   TRIG 106 08/08/2017 0844   HDL 46 08/08/2017 0844   CHOLHDL 3 03/26/2016 1704   VLDL 23.0 03/26/2016 1704   LDLCALC 97 08/08/2017 0844   Hepatic Function Panel     Component Value Date/Time   PROT 6.6 08/08/2017 0844   ALBUMIN 4.4 08/08/2017 0844   AST 16 08/08/2017 0844   ALT 21 08/08/2017 0844   ALKPHOS 80 08/08/2017 0844   BILITOT 0.2  08/08/2017 0844   BILIDIR 0.0 03/26/2016 1704      Component Value Date/Time   TSH 0.514 02/02/2018 0827   TSH 0.600 08/08/2017 0844   TSH 0.399 (L) 04/11/2017 1040    ASSESSMENT AND PLAN: Class 2 severe obesity with serious comorbidity and body mass index (BMI) of 39.0 to 39.9 in adult, unspecified obesity type (HCC)  Prediabetes - Plan: metFORMIN (GLUCOPHAGE) 500 MG tablet  Vitamin D deficiency - Plan: calcitRIOL (ROCALTROL) 0.25 MCG capsule  At risk for diabetes mellitus  PLAN: Pre-Diabetes Alima will continue to work on weight loss, exercise, and decreasing simple carbohydrates in her diet to help decrease the risk of diabetes. We dicussed metformin including benefits and risks. She was informed that eating too many simple carbohydrates or too many calories at one sitting increases the likelihood of GI side effects. Sharmila agrees to continue metformin #30 with no refills. Shaunae agreed to follow up with [place] as directed to monitor her progress.  Vitamin D Deficiency Nia was informed that low vitamin D levels contributes to fatigue and are associated with obesity, breast, and colon cancer. She agrees to continue to take prescription Vit D @50 ,000 IU every week #4 with no refills and will follow up for routine testing of vitamin D, at least 2-3 times per year. She was informed of the risk of over-replacement of vitamin D and agrees to not increase her dose unless she discusses this with [place] first. She agrees to follow up with our clinic as directed.   Diabetes risk counseling Cambri was given extended (15 minutes)  diabetes prevention counseling today. She is 52 y.o. female and has risk factors for diabetes including obesity. We discussed intensive lifestyle modifications today with an emphasis on weight loss as well as increasing exercise and decreasing simple carbohydrates in her diet.  Obesity Cala BradfordKimberly is not currently in the action stage of change. As such, her goal is  to maintain weight for now She has agreed to portion control better and make smarter food choices, such as increase vegetables and decrease simple carbohydrates . She is not ready to get back to a structured plan and we agreed to work on maintaining weight over the next month.  Cala BradfordKimberly has been instructed to work up to a goal of 150 minutes of combined cardio and strengthening exercise per week for weight loss and overall health benefits. We discussed the following Behavioral Modification Stratagies today: increasing lean protein intake, work on meal planning and easy cooking plans and emotional eating strategies and family/coworker sabotage.    Cala BradfordKimberly has agreed to follow up with our clinic in 4 weeks. She was informed of the importance of frequent follow up visits to maximize her success with intensive lifestyle modifications for her multiple health conditions.   OBESITY BEHAVIORAL INTERVENTION VISIT  Today's visit was # 13   Starting weight: 262 lb Starting date: 04/11/17 Today's weight : 232 lb Today's date: 03/27/18 Total lbs lost to date: 30 lb    ASK: We discussed the diagnosis of obesity with Denzil MagnusonKimberly York today and Cala BradfordKimberly agreed to give us permission to discuss obesity behavioral modification therapy today.  ASSESS: Cala BradfordKimberly has the diagnosis of obesity and her BMI today is 39.8 Cala BradfordKimberly is not in the action stage of change   ADVISE: Cala BradfordKimberly was educated on the multiple health risks of obesity as well as the benefit of weight loss to improve her health. She was advised of the need for long term treatment and the importance of lifestyle modifications to improve her current health and to decrease her risk of future health problems.  AGREE: Multiple dietary modification options and treatment options were discussed and  Cala BradfordKimberly agreed to follow the recommendations documented in the above note.  ARRANGE: Cala BradfordKimberly was educated on the importance of frequent visits to treat  obesity as outlined per CMS and USPSTF guidelines and agreed to schedule her next follow up appointment today.  I, Jeralene PetersAshleigh Haynes, am acting as transcriptionist for Quillian Quincearen Emojean Gertz, MD   I have reviewed the above documentation for accuracy and completeness, and I agree with the above. -Quillian Quincearen Calianne Larue, MD

## 2018-04-03 DIAGNOSIS — J209 Acute bronchitis, unspecified: Secondary | ICD-10-CM | POA: Diagnosis not present

## 2018-04-03 DIAGNOSIS — R062 Wheezing: Secondary | ICD-10-CM | POA: Diagnosis not present

## 2018-04-03 DIAGNOSIS — R531 Weakness: Secondary | ICD-10-CM | POA: Diagnosis not present

## 2018-04-03 DIAGNOSIS — J441 Chronic obstructive pulmonary disease with (acute) exacerbation: Secondary | ICD-10-CM | POA: Diagnosis not present

## 2018-04-03 DIAGNOSIS — L930 Discoid lupus erythematosus: Secondary | ICD-10-CM | POA: Diagnosis not present

## 2018-04-03 DIAGNOSIS — L300 Nummular dermatitis: Secondary | ICD-10-CM | POA: Diagnosis not present

## 2018-04-05 ENCOUNTER — Encounter: Payer: Self-pay | Admitting: Internal Medicine

## 2018-04-05 ENCOUNTER — Telehealth: Payer: Self-pay | Admitting: Internal Medicine

## 2018-04-05 ENCOUNTER — Ambulatory Visit: Payer: BLUE CROSS/BLUE SHIELD | Admitting: Internal Medicine

## 2018-04-05 DIAGNOSIS — R062 Wheezing: Secondary | ICD-10-CM | POA: Diagnosis not present

## 2018-04-05 DIAGNOSIS — J209 Acute bronchitis, unspecified: Secondary | ICD-10-CM

## 2018-04-05 MED ORDER — PROMETHAZINE-CODEINE 6.25-10 MG/5ML PO SYRP: 5.0000 mL | ORAL_SOLUTION | ORAL | 0 refills | Status: DC | PRN

## 2018-04-05 NOTE — Progress Notes (Signed)
Subjective:  Patient ID: Tiffany Medina, female    DOB: 1966/06/30  Age: 52 y.o. MRN: [id number]  CC: No chief complaint on file.   HPI Tiffany Medina presents for bronchitis w/dry cough since last Fri The pt saw an NP at [facility] on Mon: prednisone, tessalon Not better.   Outpatient Medications Prior to Visit  Medication Sig Dispense Refill  . Albuterol Sulfate (PROAIR RESPICLICK) 108 (90 Base) MCG/ACT AEPB Inhale 1-2 puffs into the lungs 4 (four) times daily as needed. 3 each 0  . ALPRAZolam (XANAX) 0.5 MG tablet TAKE 1 OR 2 TABLETS BY MOUTH TWICE DAILY AS NEEDED FOR insomnia, anxiety 90 tablet 2  . Armodafinil 200 MG TABS Take 1 tablet by mouth daily. 90 tablet 1  . aspirin (GOODSENSE ASPIRIN) 325 MG tablet Take 1 tablet (325 mg total) by mouth 2 (two) times daily. 100 tablet 3  . benzonatate (TESSALON) 200 MG capsule TAKE 1 CAPSULE BY MOUTH 3 TIMES DAILY AS NEEDED FOR COUGH  0  . Black Cohosh 540 MG CAPS 1 po qhs 90 capsule 3  . buPROPion (WELLBUTRIN XL) 150 MG 24 hr tablet Take 1 tablet (150 mg total) by mouth daily. 30 tablet 5  . calcitRIOL (ROCALTROL) 0.25 MCG capsule Take 1 capsule (0.25 mcg total) by mouth 2 (two) times daily. 60 capsule 0  . Cyanocobalamin 1000 MCG SUBL Take 1 tablet (1,000 mcg total) by mouth daily. 90 each 3  . diphenhydrAMINE (BENADRYL) 25 MG tablet Take 1 tablet (25 mg total) by mouth every 6 (six) hours as needed for allergies. 100 tablet 3  . doxycycline (VIBRAMYCIN) 100 MG capsule Take by mouth.    . fluconazole (DIFLUCAN) 150 MG tablet TAKE 1 TABLET BY MOUTH ONCE, MAY REPEAT IN 3 DAYS IF NEEDED  0  . gabapentin (NEURONTIN) 300 MG capsule TAKE 1 TO 2 CAPSULES AT BEDTIME FOR NERVE PAIN 270 capsule 3  . guaiFENesin (MUCINEX) 600 MG 12 hr tablet Take by mouth.    . levothyroxine (SYNTHROID, LEVOTHROID) 100 MCG tablet Take 1 tablet (100 mcg total) by mouth daily before breakfast. 90 tablet 3  . loratadine (CLARITIN) 10 MG tablet Take 1 tablet (10 mg total) by  mouth daily. 90 tablet 1  . metFORMIN (GLUCOPHAGE) 500 MG tablet Take 1 tablet (500 mg total) by mouth daily with breakfast. 30 tablet 0  . predniSONE (DELTASONE) 20 MG tablet TAKE 2 TABLETS BY MOUTH DAILY FOR 5 DAYS  0  . ranitidine (ZANTAC) 150 MG tablet Take 1 tablet (150 mg total) by mouth 2 (two) times daily. 180 tablet 3  . traMADol (ULTRAM) 50 MG tablet Take 1 tablet (50 mg total) by mouth 2 (two) times daily. 180 tablet 1   No facility-administered medications prior to visit.     ROS: Review of Systems  Constitutional: Negative for activity change, appetite change, chills, fatigue and unexpected weight change.  HENT: Positive for congestion, sinus pressure and sinus pain. Negative for mouth sores.   Eyes: Negative for visual disturbance.  Respiratory: Positive for cough and wheezing. Negative for chest tightness.   Gastrointestinal: Negative for abdominal pain and nausea.  Genitourinary: Negative for difficulty urinating, frequency and vaginal pain.  Musculoskeletal: Negative for back pain and gait problem.  Skin: Negative for pallor and rash.  Neurological: Positive for weakness. Negative for dizziness, tremors, numbness and headaches.  Psychiatric/Behavioral: Negative for confusion and sleep disturbance.    Objective:  BP 126/82 (BP Location: Left Arm, Patient Position: Sitting, Cuff Size:  Large)   Pulse 85   Temp 98.1 F (36.7 C) (Oral)   Ht 5\' 4"  (1.626 m)   Wt 232 lb (105.2 kg)   SpO2 96%   BMI 39.82 kg/m   BP Readings from Last 3 Encounters:  04/05/18 126/82  03/27/18 114/77  03/24/18 124/72    Wt Readings from Last 3 Encounters:  04/05/18 232 lb (105.2 kg)  03/27/18 232 lb (105.2 kg)  03/24/18 236 lb (107 kg)    Physical Exam  Constitutional: She appears well-developed. No distress.  HENT:  Head: Normocephalic.  Right Ear: External ear normal.  Left Ear: External ear normal.  Nose: Nose normal.  Mouth/Throat: Oropharynx is clear and moist.  Eyes:  Pupils are equal, round, and reactive to light. Conjunctivae are normal. Right eye exhibits no discharge. Left eye exhibits no discharge.  Neck: Normal range of motion. Neck supple. No JVD present. No tracheal deviation present. No thyromegaly present.  Cardiovascular: Normal rate, regular rhythm and normal heart sounds.  Pulmonary/Chest: No stridor. No respiratory distress. She has no wheezes.  Abdominal: Soft. Bowel sounds are normal. She exhibits no distension and no mass. There is no tenderness. There is no rebound and no guarding.  Musculoskeletal: She exhibits no edema or tenderness.  Lymphadenopathy:    She has no cervical adenopathy.  Neurological: She displays normal reflexes. No cranial nerve deficit. She exhibits normal muscle tone. Coordination normal.  Skin: No rash noted. No erythema.  Psychiatric: She has a normal mood and affect. Her behavior is normal. Judgment and thought content normal.  eryth throat/nares  Lab Results  Component Value Date   WBC 7.4 04/11/2017   HGB 13.9 04/11/2017   HCT 41.6 04/11/2017   PLT 316 12/02/2016   GLUCOSE 87 08/08/2017   CHOL 164 08/08/2017   TRIG 106 08/08/2017   HDL 46 08/08/2017   LDLCALC 97 08/08/2017   ALT 21 08/08/2017   AST 16 08/08/2017   NA 139 08/08/2017   K 5.2 08/08/2017   CL 98 08/08/2017   CREATININE 0.72 08/08/2017   BUN 17 08/08/2017   CO2 26 08/08/2017   TSH 0.514 02/02/2018   INR 1.02 11/28/2014   HGBA1C 5.6 02/02/2018    Dg Finger Index Right  Result Date: 10/14/2017 CLINICAL DATA:  Slammed finger in car door today. Swelling, and pain since. EXAM: RIGHT INDEX FINGER 2+V COMPARISON:  None. FINDINGS: No fracture.  No bone lesion. Joints are normally spaced and aligned.  No arthropathic changes. There is soft tissue swelling. Soft tissue air is seen along the proximal to mid aspect of the finger, mostly along the palmar surface. No radiopaque foreign body. IMPRESSION: 1. No fracture or dislocation. 2. Soft tissue  swelling and soft tissue air, the latter consistent with a laceration. No radiopaque foreign body. Electronically Signed   By: Amie Portland M.D.   On: 10/14/2017 08:17    Assessment & Plan:   There are no diagnoses linked to this encounter.   No orders of the defined types were placed in this encounter.    Follow-up: No follow-ups on file.  Sonda Primes, MD

## 2018-04-05 NOTE — Telephone Encounter (Signed)
Pt was not able to get promethazine-codeine (PHENERGAN WITH CODEINE) 6.25-10 MG/5ML syrup from [facility]. Marchelle Folks with [facility] requesting new RX  [facility] [phone] - [place], [place] - [address] AT [facility] OF [facility] & [address] [phone] (Phone) [phone] (Fax)       Copied from CRM [id number]. Topic: General - Other >> Apr 05, 2018 12:56 PM Percival Spanish wrote:  Pt insurance will not cover at her pharmacy  so she is asking for the RX to be sent to [facility] promethazine-codeine (PHENERGAN WITH CODEINE) 6.25-10 MG/5ML syrup

## 2018-04-05 NOTE — Assessment & Plan Note (Signed)
Finish Prednisone Rest Prom-cod Use Albuterol MDI 

## 2018-04-05 NOTE — Assessment & Plan Note (Signed)
Finish Prednisone Rest Prom-cod Use Albuterol MDI

## 2018-04-05 NOTE — Patient Instructions (Signed)
You can use over-the-counter  "cold" medicines  such as  "Afrin" nasal spray for nasal congestion as directed. Use " Delsym" or" Robitussin" cough syrup varietis for cough.  You can use plain "Tylenol" or "Advil" for fever, chills and achyness. Use Halls or Ricola cough drops.     Please, make an appointment if you are not better or if you're worse.  

## 2018-04-06 MED ORDER — PROMETHAZINE-CODEINE 6.25-10 MG/5ML PO SYRP: 5.0000 mL | ORAL_SOLUTION | ORAL | 0 refills | Status: DC | PRN

## 2018-04-06 NOTE — Telephone Encounter (Signed)
OK. Thx

## 2018-04-24 ENCOUNTER — Encounter (INDEPENDENT_AMBULATORY_CARE_PROVIDER_SITE_OTHER): Payer: Self-pay

## 2018-04-24 ENCOUNTER — Ambulatory Visit (INDEPENDENT_AMBULATORY_CARE_PROVIDER_SITE_OTHER): Payer: BLUE CROSS/BLUE SHIELD | Admitting: Family Medicine

## 2018-04-25 ENCOUNTER — Ambulatory Visit: Payer: BLUE CROSS/BLUE SHIELD | Admitting: Internal Medicine

## 2018-04-27 ENCOUNTER — Encounter (INDEPENDENT_AMBULATORY_CARE_PROVIDER_SITE_OTHER): Payer: Self-pay

## 2018-04-27 DIAGNOSIS — D123 Benign neoplasm of transverse colon: Secondary | ICD-10-CM | POA: Diagnosis not present

## 2018-04-27 DIAGNOSIS — K635 Polyp of colon: Secondary | ICD-10-CM | POA: Diagnosis not present

## 2018-04-27 DIAGNOSIS — Z800 Family history of malignant neoplasm of digestive organs: Secondary | ICD-10-CM | POA: Diagnosis not present

## 2018-04-27 DIAGNOSIS — Z1211 Encounter for screening for malignant neoplasm of colon: Secondary | ICD-10-CM | POA: Diagnosis not present

## 2018-04-27 DIAGNOSIS — K5730 Diverticulosis of large intestine without perforation or abscess without bleeding: Secondary | ICD-10-CM | POA: Diagnosis not present

## 2018-04-27 LAB — HM COLONOSCOPY

## 2018-04-28 ENCOUNTER — Encounter: Payer: Self-pay | Admitting: Internal Medicine

## 2018-05-01 DIAGNOSIS — L299 Pruritus, unspecified: Secondary | ICD-10-CM | POA: Diagnosis not present

## 2018-05-01 DIAGNOSIS — L300 Nummular dermatitis: Secondary | ICD-10-CM | POA: Diagnosis not present

## 2018-05-02 ENCOUNTER — Encounter (INDEPENDENT_AMBULATORY_CARE_PROVIDER_SITE_OTHER): Payer: Self-pay

## 2018-05-02 ENCOUNTER — Telehealth (INDEPENDENT_AMBULATORY_CARE_PROVIDER_SITE_OTHER): Payer: Self-pay | Admitting: Family Medicine

## 2018-05-02 ENCOUNTER — Ambulatory Visit (INDEPENDENT_AMBULATORY_CARE_PROVIDER_SITE_OTHER): Payer: BLUE CROSS/BLUE SHIELD | Admitting: Family Medicine

## 2018-05-02 NOTE — Telephone Encounter (Signed)
Needs refill on Calcitrol and Metformin. Pt did not receive message to r/s appt; pt r/s for 10/31. Is completely out of meds. drh

## 2018-05-03 ENCOUNTER — Other Ambulatory Visit (INDEPENDENT_AMBULATORY_CARE_PROVIDER_SITE_OTHER): Payer: Self-pay

## 2018-05-03 DIAGNOSIS — E559 Vitamin D deficiency, unspecified: Secondary | ICD-10-CM

## 2018-05-03 DIAGNOSIS — R7303 Prediabetes: Secondary | ICD-10-CM

## 2018-05-03 MED ORDER — METFORMIN HCL 500 MG PO TABS: 500.0000 mg | ORAL_TABLET | Freq: Every day | ORAL | 0 refills | Status: DC

## 2018-05-03 MED ORDER — CALCITRIOL 0.25 MCG PO CAPS: 0.2500 ug | ORAL_CAPSULE | Freq: Two times a day (BID) | ORAL | 0 refills | Status: DC

## 2018-05-03 NOTE — Telephone Encounter (Signed)
Refills sent  over to the pharmacy. April, CMA

## 2018-05-04 ENCOUNTER — Ambulatory Visit (INDEPENDENT_AMBULATORY_CARE_PROVIDER_SITE_OTHER): Payer: BLUE CROSS/BLUE SHIELD | Admitting: Family Medicine

## 2018-05-04 ENCOUNTER — Encounter (INDEPENDENT_AMBULATORY_CARE_PROVIDER_SITE_OTHER): Payer: Self-pay | Admitting: Family Medicine

## 2018-05-04 VITALS — BP 127/76 | HR 82 | Temp 97.9°F | Ht 64.0 in | Wt 233.0 lb

## 2018-05-04 DIAGNOSIS — Z9189 Other specified personal risk factors, not elsewhere classified: Secondary | ICD-10-CM

## 2018-05-04 DIAGNOSIS — E559 Vitamin D deficiency, unspecified: Secondary | ICD-10-CM

## 2018-05-04 DIAGNOSIS — F3289 Other specified depressive episodes: Secondary | ICD-10-CM | POA: Diagnosis not present

## 2018-05-04 DIAGNOSIS — R7303 Prediabetes: Secondary | ICD-10-CM

## 2018-05-04 DIAGNOSIS — Z6841 Body Mass Index (BMI) 40.0 and over, adult: Secondary | ICD-10-CM

## 2018-05-04 MED ORDER — BUPROPION HCL ER (SR) 200 MG PO TB12: 200.0000 mg | ORAL_TABLET | Freq: Every day | ORAL | 0 refills | Status: DC

## 2018-05-04 MED ORDER — CALCITRIOL 0.25 MCG PO CAPS: 0.2500 ug | ORAL_CAPSULE | Freq: Two times a day (BID) | ORAL | 0 refills | Status: DC

## 2018-05-04 MED ORDER — METFORMIN HCL 500 MG PO TABS: 500.0000 mg | ORAL_TABLET | Freq: Every day | ORAL | 0 refills | Status: DC

## 2018-05-04 NOTE — Progress Notes (Signed)
Office: [phone]  /  Fax: [phone]   HPI:   Chief Complaint: OBESITY Tiffany Medina is here to discuss her progress with her obesity treatment plan. She is on the Category 3 plan and is following her eating plan approximately 50 % of the time. She states she is exercising 0 minutes 0 times per week. [name]'s goal was to maintain her weight for the last few weeks and she has done this mostly. She is working extra long hours due to inadequate staffing and she is exhausted.  Her weight is 233 lb (105.7 kg) today and has had a weight loss of 1 pound over a period of 5 weeks since her last visit. She has lost 29 lbs since starting treatment with [place].  Vitamin D deficiency Revia has a diagnosis of vitamin D deficiency. She is currently taking vit D and stable. She denies nausea, vomiting, or muscle weakness.  Depression with emotional eating behaviors [name] is struggling with emotional eating and using food for comfort to the extent that it is negatively impacting her health. She notes an increase in stress and fatigue especially with longer work hours. She notes her mood has decreased and that she is struggling with comfort eating.  Pre-Diabetes [name] has a diagnosis of pre-diabetes based on her elevated Hgb A1c and was informed this puts her at greater risk of developing diabetes. Her A1c is improving with her diet and metformin. She denies nausea, vomiting, or hypoglycemia.  At risk for diabetes [name] is at higher than average risk for developing diabetes due to her pre-diabetes and obesity.   ALLERGIES: Allergies  Allergen Reactions  . Dihydrotachysterol Hives, Itching and Rash  . Vitamin D Analogs Hives, Itching and Rash    Flushing    MEDICATIONS: Current Outpatient Medications on File Prior to Visit  Medication Sig Dispense Refill  . Albuterol Sulfate (PROAIR RESPICLICK) 765 (90 Base) MCG/ACT AEPB Inhale 1-2 puffs into the lungs 4 (four) times daily as needed. 3  each 0  . ALPRAZolam (XANAX) 0.5 MG tablet TAKE 1 OR 2 TABLETS BY MOUTH TWICE DAILY AS NEEDED FOR insomnia, anxiety 90 tablet 2  . Armodafinil 200 MG TABS Take 1 tablet by mouth daily. 90 tablet 1  . aspirin (GOODSENSE ASPIRIN) 325 MG tablet Take 1 tablet (325 mg total) by mouth 2 (two) times daily. 100 tablet 3  . benzonatate (TESSALON) 200 MG capsule TAKE 1 CAPSULE BY MOUTH 3 TIMES DAILY AS NEEDED FOR COUGH  0  . Black Cohosh 540 MG CAPS 1 po qhs 90 capsule 3  . Cyanocobalamin 1000 MCG SUBL Take 1 tablet (1,000 mcg total) by mouth daily. 90 each 3  . diphenhydrAMINE (BENADRYL) 25 MG tablet Take 1 tablet (25 mg total) by mouth every 6 (six) hours as needed for allergies. 100 tablet 3  . fluconazole (DIFLUCAN) 150 MG tablet TAKE 1 TABLET BY MOUTH ONCE, MAY REPEAT IN 3 DAYS IF NEEDED  0  . gabapentin (NEURONTIN) 300 MG capsule TAKE 1 TO 2 CAPSULES AT BEDTIME FOR NERVE PAIN 270 capsule 3  . guaiFENesin (MUCINEX) 600 MG 12 hr tablet Take by mouth.    . levothyroxine (SYNTHROID, LEVOTHROID) 100 MCG tablet Take 1 tablet (100 mcg total) by mouth daily before breakfast. 90 tablet 3  . loratadine (CLARITIN) 10 MG tablet Take 1 tablet (10 mg total) by mouth daily. 90 tablet 1  . predniSONE (DELTASONE) 20 MG tablet TAKE 2 TABLETS BY MOUTH DAILY FOR 5 DAYS  0  . promethazine-codeine (  PHENERGAN WITH CODEINE) 6.25-10 MG/5ML syrup Take 5 mLs by mouth every 4 (four) hours as needed. 300 mL 0  . ranitidine (ZANTAC) 150 MG tablet Take 1 tablet (150 mg total) by mouth 2 (two) times daily. 180 tablet 3  . traMADol (ULTRAM) 50 MG tablet Take 1 tablet (50 mg total) by mouth 2 (two) times daily. 180 tablet 1   No current facility-administered medications on file prior to visit.     PAST MEDICAL HISTORY: Past Medical History:  Diagnosis Date  . Ankle pain   . Anxiety   . Asthma   . Back pain   . Chest pain   . Chronic fatigue syndrome   . Constipation   . COPD (chronic obstructive pulmonary disease) ([place])     . Depression   . Discoid lupus   . DVT (deep venous thrombosis) ([place])   . Dyspnea   . Family history of breast cancer   . Family history of colon cancer   . Genetic testing 06/20/2015   Negative genetic testing on the Breast/Ovarian cancer panel.  The Breast/Ovarian gene panel offered by [facility] includes sequencing and rearrangement analysis for the following 20 genes:  ATM, BARD1, BRCA1, BRCA2, BRIP1, CDH1, CHEK2, EPCAM, FANCC, MLH1, MSH2, MSH6, NBN, PALB2, PMS2, PTEN, RAD51C, RAD51D, TP53, and XRCC2.   The report date is June 19, 2015.   Marland Kitchen GERD (gastroesophageal reflux disease)   . Hot flashes   . Hypothyroidism   . Insomnia   . Leg edema   . Lupus ([place])   . Neuropathy   . Obesity   . Sciatica   . Sleep apnea     PAST SURGICAL HISTORY: Past Surgical History:  Procedure Laterality Date  . ABDOMINAL HYSTERECTOMY    . APPENDECTOMY    . KNEE ARTHROSCOPY W/ MENISCAL REPAIR    . TUBAL LIGATION  1987  . VEIN LIGATION AND STRIPPING      SOCIAL HISTORY: Social History   Tobacco Use  . Smoking status: Current Every Day Smoker    Packs/day: 1.00    Types: Cigarettes  . Smokeless tobacco: Never Used  Substance Use Topics  . Alcohol use: No    Alcohol/week: 0.0 standard drinks  . Drug use: No    FAMILY HISTORY: Family History  Problem Relation Age of Onset  . Breast cancer Mother 75       bilateral breast cancer  . Hodgkin's lymphoma Father        dx in his 50s  . Lung cancer Father 35  . Lung cancer Sister 54       adenocarcinoma (also a smoker)  . Lupus Maternal Uncle   . Colon cancer Maternal Uncle        dx in his 2s  . Cancer Paternal Uncle        2 paternal uncles with cancer NOS  . Diabetes Maternal Grandmother   . Colon cancer Maternal Grandfather        late 62s  . Diabetes Paternal Grandmother   . Colon cancer Paternal Grandfather 8  . Breast cancer Sister 59  . Cancer Other   . Diabetes Other   . Mental illness Other     ROS: Review of  Systems  Constitutional: Positive for malaise/fatigue. Negative for weight loss.  Gastrointestinal: Negative for nausea and vomiting.  Musculoskeletal:       Negative for muscle weakness.  Endo/Heme/Allergies:       Negative for hypoglycemia.  Psychiatric/Behavioral: Positive for depression.  PHYSICAL EXAM: Blood pressure 127/76, pulse 82, temperature 97.9 F (36.6 C), temperature source Oral, height 5' 4" (1.626 m), weight 233 lb (105.7 kg), SpO2 97 %. Body mass index is 39.99 kg/m. Physical Exam  Constitutional: She is oriented to person, place, and time. She appears well-developed and well-nourished.  Cardiovascular: Normal rate.  Pulmonary/Chest: Effort normal.  Musculoskeletal: Normal range of motion.  Neurological: She is oriented to person, place, and time.  Skin: Skin is warm and dry.  Psychiatric: She has a normal mood and affect. Her behavior is normal.  Vitals reviewed.   RECENT LABS AND TESTS: BMET    Component Value Date/Time   NA 139 08/08/2017 0844   K 5.2 08/08/2017 0844   CL 98 08/08/2017 0844   CO2 26 08/08/2017 0844   GLUCOSE 87 08/08/2017 0844   GLUCOSE 112 (H) 12/02/2016 1756   BUN 17 08/08/2017 0844   CREATININE 0.72 08/08/2017 0844   CALCIUM 9.6 08/08/2017 0844   GFRNONAA 97 08/08/2017 0844   GFRAA 111 08/08/2017 0844   Lab Results  Component Value Date   HGBA1C 5.6 02/02/2018   HGBA1C 5.7 (H) 08/08/2017   HGBA1C 6.0 (H) 04/11/2017   Lab Results  Component Value Date   INSULIN 12.3 02/02/2018   INSULIN 11.5 08/08/2017   INSULIN 23.7 04/11/2017   CBC    Component Value Date/Time   WBC 7.4 04/11/2017 1040   WBC 6.0 12/02/2016 1756   RBC 4.51 04/11/2017 1040   RBC 4.20 12/02/2016 1756   HGB 13.9 04/11/2017 1040   HGB 14.4 12/25/2010 1504   HCT 41.6 04/11/2017 1040   HCT 41.5 12/25/2010 1504   PLT 316 12/02/2016 1756   PLT 329 12/25/2010 1504   MCV 92 04/11/2017 1040   MCV 94.7 12/25/2010 1504   MCH 30.8 04/11/2017 1040   MCH  31.7 12/02/2016 1756   MCHC 33.4 04/11/2017 1040   MCHC 33.3 12/02/2016 1756   RDW 13.6 04/11/2017 1040   RDW 13.0 12/25/2010 1504   LYMPHSABS 1.2 04/11/2017 1040   LYMPHSABS 1.7 12/25/2010 1504   MONOABS 0.5 12/02/2016 1756   MONOABS 0.8 12/25/2010 1504   EOSABS 0.2 04/11/2017 1040   BASOSABS 0.0 04/11/2017 1040   BASOSABS 0.0 12/25/2010 1504   Iron/TIBC/Ferritin/ %Sat No results found for: IRON, TIBC, FERRITIN, IRONPCTSAT Lipid Panel     Component Value Date/Time   CHOL 164 08/08/2017 0844   TRIG 106 08/08/2017 0844   HDL 46 08/08/2017 0844   CHOLHDL 3 03/26/2016 1704   VLDL 23.0 03/26/2016 1704   LDLCALC 97 08/08/2017 0844   Hepatic Function Panel     Component Value Date/Time   PROT 6.6 08/08/2017 0844   ALBUMIN 4.4 08/08/2017 0844   AST 16 08/08/2017 0844   ALT 21 08/08/2017 0844   ALKPHOS 80 08/08/2017 0844   BILITOT 0.2 08/08/2017 0844   BILIDIR 0.0 03/26/2016 1704      Component Value Date/Time   TSH 0.514 02/02/2018 0827   TSH 0.600 08/08/2017 0844   TSH 0.399 (L) 04/11/2017 1040   Results for SHARMAYNE, JABLON (MRN [id number]) as of 05/04/2018 12:43  Ref. Range 02/02/2018 08:27  Vitamin D, 25-Hydroxy Latest Ref Range: 30.0 - 100.0 ng/mL 25.2 (L)   ASSESSMENT AND PLAN: Vitamin D deficiency - Plan: calcitRIOL (ROCALTROL) 0.25 MCG capsule  Prediabetes - Plan: metFORMIN (GLUCOPHAGE) 500 MG tablet  Other depression - with emotional eating - Plan: buPROPion (WELLBUTRIN SR) 200 MG 12 hr tablet  At risk for diabetes  mellitus  Class 3 severe obesity with serious comorbidity and body mass index (BMI) of 40.0 to 44.9 in adult, unspecified obesity type ([place])  PLAN:  Vitamin D Deficiency [place] was informed that low vitamin D levels contributes to fatigue and are associated with obesity, breast, and colon cancer. She agrees to continue to take prescription calcitriol 0.25mg 1 PO BID #60 with no refills and will follow up for routine testing of vitamin D, at  least 2-3 times per year. She was informed of the risk of over-replacement of vitamin D and agrees to not increase her dose unless she discusses this with [place]. KNaziyaagreed to follow up in 2 to 3 weeks.  Depression with Emotional Eating Behaviors We discussed behavior modification techniques today to help KNeahdeal with her emotional eating and depression. She has agreed to discontinue Wellbutrin XL and she will start Wellbutrin SR 2087mqAM #30 with no refills. KiDarlisgreed to follow up as directed.  Pre-Diabetes KiLavoniaill continue to work on weight loss, exercise, and decreasing simple carbohydrates in her diet to help decrease the risk of diabetes. She was informed that eating too many simple carbohydrates or too many calories at one sitting increases the likelihood of GI side effects. KiArnellgrees to continue taking metformin 50066mAM # 30 with no refills and a prescription was written today. KimBrenlynnreed to follow up with us [place] directed to monitor her progress.  Diabetes risk counseling KimOneikas given extended (15 minutes) diabetes prevention counseling today. She is 52 41o. female and has risk factors for diabetes including pre-diabetes and obesity. We discussed intensive lifestyle modifications today with an emphasis on weight loss as well as increasing exercise and decreasing simple carbohydrates in her diet.  Obesity KimHasina currently in the action stage of change. As such, her goal is to maintain weight for now until job situation improves. She has agreed to follow the Category 3 plan. KimNolyns been instructed to work up to a goal of 150 minutes of combined cardio and strengthening exercise per week for weight loss and overall health benefits. We discussed the following Behavioral Modification Strategies today: increasing lean protein intake, decreasing simple carbohydrates, and work on meal planning and easy cooking plans.   KimChelseys agreed to  follow up with our clinic in 2 to 3 weeks. She was informed of the importance of frequent follow up visits to maximize her success with intensive lifestyle modifications for her multiple health conditions.   OBESITY BEHAVIORAL INTERVENTION VISIT  Today's visit was # 14   Starting weight: 262 Starting date: 04/11/17 Today's weight : Weight: 233 lb (105.7 kg)  Today's date: 05/04/2018 Total lbs lost to date: 29 59SK: We discussed the diagnosis of obesity with KimElvina Sidleday and KimLilureed to give us [place]ission to discuss obesity behavioral modification therapy today.  ASSESS: KimOmmies the diagnosis of obesity and her BMI today is 39.97. KimJezelle in the action stage of change.   ADVISE: KimAlexsandras educated on the multiple health risks of obesity as well as the benefit of weight loss to improve her health. She was advised of the need for long term treatment and the importance of lifestyle modifications to improve her current health and to decrease her risk of future health problems.  AGREE: Multiple dietary modification options and treatment options were discussed and KimHarureed to follow the recommendations documented in the above note.  ARRANGE: KimUmaizas educated on the importance of frequent  visits to treat obesity as outlined per CMS and USPSTF guidelines and agreed to schedule her next follow up appointment today.  I, Marcille Blanco, am acting as transcriptionist for Starlyn Skeans, MD  I have reviewed the above documentation for accuracy and completeness, and I agree with the above. -Dennard Nip, MD

## 2018-05-05 DIAGNOSIS — Z86010 Personal history of colonic polyps: Secondary | ICD-10-CM | POA: Insufficient documentation

## 2018-05-18 DIAGNOSIS — B349 Viral infection, unspecified: Secondary | ICD-10-CM | POA: Diagnosis not present

## 2018-05-18 DIAGNOSIS — Z8639 Personal history of other endocrine, nutritional and metabolic disease: Secondary | ICD-10-CM | POA: Diagnosis not present

## 2018-05-18 DIAGNOSIS — M791 Myalgia, unspecified site: Secondary | ICD-10-CM | POA: Diagnosis not present

## 2018-05-18 DIAGNOSIS — R509 Fever, unspecified: Secondary | ICD-10-CM | POA: Diagnosis not present

## 2018-05-25 ENCOUNTER — Ambulatory Visit (INDEPENDENT_AMBULATORY_CARE_PROVIDER_SITE_OTHER): Payer: BLUE CROSS/BLUE SHIELD | Admitting: Family Medicine

## 2018-05-25 VITALS — BP 133/83 | HR 86 | Temp 98.6°F | Ht 64.0 in | Wt 234.0 lb

## 2018-05-25 DIAGNOSIS — F3289 Other specified depressive episodes: Secondary | ICD-10-CM

## 2018-05-25 DIAGNOSIS — Z9189 Other specified personal risk factors, not elsewhere classified: Secondary | ICD-10-CM

## 2018-05-25 DIAGNOSIS — R7303 Prediabetes: Secondary | ICD-10-CM

## 2018-05-25 DIAGNOSIS — E559 Vitamin D deficiency, unspecified: Secondary | ICD-10-CM | POA: Diagnosis not present

## 2018-05-25 DIAGNOSIS — Z6841 Body Mass Index (BMI) 40.0 and over, adult: Secondary | ICD-10-CM

## 2018-05-25 MED ORDER — METFORMIN HCL 500 MG PO TABS: 500.0000 mg | ORAL_TABLET | Freq: Every day | ORAL | 0 refills | Status: DC

## 2018-05-25 MED ORDER — CALCITRIOL 0.25 MCG PO CAPS: 0.2500 ug | ORAL_CAPSULE | Freq: Two times a day (BID) | ORAL | 0 refills | Status: DC

## 2018-05-25 MED ORDER — BUPROPION HCL ER (SR) 200 MG PO TB12: 200.0000 mg | ORAL_TABLET | Freq: Every day | ORAL | 0 refills | Status: DC

## 2018-05-30 NOTE — Progress Notes (Addendum)
Office: [phone]  /  Fax: [phone]   HPI:   Chief Complaint: OBESITY Tiffany Medina is here to discuss her progress with her obesity treatment plan. She is on the Category 3 plan and is following her eating plan approximately 50 % of the time. She states she is exercising 0 minutes 0 times per week. Tiffany Medina is doing well maintaining weight loss, but is no following her plan closely.  Her weight is 234 lb (106.1 kg) today and has had a weight gain of 1 pound over a period of 3 weeks since her last visit. She has lost 28 lbs since starting treatment with [place].  Pre-Diabetes Tiffany Medina has a diagnosis of pre-diabetes based on her elevated Hgb A1c and was informed this puts her at greater risk of developing diabetes. She is taking metformin currently and is stable. She continues to work on diet and exercise to decrease risk of diabetes. She denies nausea, vomiting, or hypoglycemia.  At risk for diabetes Tiffany Medina is at higher than average risk for developing diabetes due to her pre-diabetes and obesity. She currently denies polyuria or polydipsia.  Vitamin D deficiency Tiffany Medina has a diagnosis of vitamin D deficiency. She is currently taking vit D and is stable. She denies nausea, vomiting, or muscle weakness.  Depression with emotional eating behaviors Tiffany Medina is struggling with emotional eating and using food for comfort to the extent that it is negatively impacting her health. She often snacks when she is not hungry. Tiffany Medina sometimes feels she is out of control and then feels guilty that she made poor food choices. She has been working on behavior modification techniques to help reduce her emotional eating and has been somewhat successful. She is stable on Wellbutrin, but she still notes increased stress at work. She feels that she has done better decreasing emotional eating. She shows no sign of suicidal or homicidal ideations.  ALLERGIES: Allergies  Allergen Reactions  .  Dihydrotachysterol Hives, Itching and Rash  . Vitamin D Analogs Hives, Itching and Rash    Flushing    MEDICATIONS: Current Outpatient Medications on File Prior to Visit  Medication Sig Dispense Refill  . Albuterol Sulfate (PROAIR RESPICLICK) 573 (90 Base) MCG/ACT AEPB Inhale 1-2 puffs into the lungs 4 (four) times daily as needed. 3 each 0  . ALPRAZolam (XANAX) 0.5 MG tablet TAKE 1 OR 2 TABLETS BY MOUTH TWICE DAILY AS NEEDED FOR insomnia, anxiety 90 tablet 2  . Armodafinil 200 MG TABS Take 1 tablet by mouth daily. 90 tablet 1  . aspirin (GOODSENSE ASPIRIN) 325 MG tablet Take 1 tablet (325 mg total) by mouth 2 (two) times daily. 100 tablet 3  . benzonatate (TESSALON) 200 MG capsule TAKE 1 CAPSULE BY MOUTH 3 TIMES DAILY AS NEEDED FOR COUGH  0  . Black Cohosh 540 MG CAPS 1 po qhs 90 capsule 3  . Cyanocobalamin 1000 MCG SUBL Take 1 tablet (1,000 mcg total) by mouth daily. 90 each 3  . diphenhydrAMINE (BENADRYL) 25 MG tablet Take 1 tablet (25 mg total) by mouth every 6 (six) hours as needed for allergies. 100 tablet 3  . fluconazole (DIFLUCAN) 150 MG tablet TAKE 1 TABLET BY MOUTH ONCE, MAY REPEAT IN 3 DAYS IF NEEDED  0  . gabapentin (NEURONTIN) 300 MG capsule TAKE 1 TO 2 CAPSULES AT BEDTIME FOR NERVE PAIN 270 capsule 3  . guaiFENesin (MUCINEX) 600 MG 12 hr tablet Take by mouth.    . levothyroxine (SYNTHROID, LEVOTHROID) 100 MCG tablet Take 1 tablet (100  mcg total) by mouth daily before breakfast. 90 tablet 3  . loratadine (CLARITIN) 10 MG tablet Take 1 tablet (10 mg total) by mouth daily. 90 tablet 1  . omeprazole (PRILOSEC) 20 MG capsule Take 20 mg by mouth daily.    . predniSONE (DELTASONE) 20 MG tablet TAKE 2 TABLETS BY MOUTH DAILY FOR 5 DAYS  0  . promethazine-codeine (PHENERGAN WITH CODEINE) 6.25-10 MG/5ML syrup Take 5 mLs by mouth every 4 (four) hours as needed. 300 mL 0  . traMADol (ULTRAM) 50 MG tablet Take 1 tablet (50 mg total) by mouth 2 (two) times daily. 180 tablet 1   No current  facility-administered medications on file prior to visit.     PAST MEDICAL HISTORY: Past Medical History:  Diagnosis Date  . Ankle pain   . Anxiety   . Asthma   . Back pain   . Chest pain   . Chronic fatigue syndrome   . Constipation   . COPD (chronic obstructive pulmonary disease) ([place])   . Depression   . Discoid lupus   . DVT (deep venous thrombosis) ([place])   . Dyspnea   . Family history of breast cancer   . Family history of colon cancer   . Genetic testing 06/20/2015   Negative genetic testing on the Breast/Ovarian cancer panel.  The Breast/Ovarian gene panel offered by [facility] includes sequencing and rearrangement analysis for the following 20 genes:  ATM, BARD1, BRCA1, BRCA2, BRIP1, CDH1, CHEK2, EPCAM, FANCC, MLH1, MSH2, MSH6, NBN, PALB2, PMS2, PTEN, RAD51C, RAD51D, TP53, and XRCC2.   The report date is June 19, 2015.   Marland Kitchen GERD (gastroesophageal reflux disease)   . Hot flashes   . Hypothyroidism   . Insomnia   . Leg edema   . Lupus ([place])   . Neuropathy   . Obesity   . Sciatica   . Sleep apnea     PAST SURGICAL HISTORY: Past Surgical History:  Procedure Laterality Date  . ABDOMINAL HYSTERECTOMY    . APPENDECTOMY    . KNEE ARTHROSCOPY W/ MENISCAL REPAIR    . TUBAL LIGATION  1987  . VEIN LIGATION AND STRIPPING      SOCIAL HISTORY: Social History   Tobacco Use  . Smoking status: Current Every Day Smoker    Packs/day: 1.00    Types: Cigarettes  . Smokeless tobacco: Never Used  Substance Use Topics  . Alcohol use: No    Alcohol/week: 0.0 standard drinks  . Drug use: No    FAMILY HISTORY: Family History  Problem Relation Age of Onset  . Breast cancer Mother 77       bilateral breast cancer  . Hodgkin's lymphoma Father        dx in his 52s  . Lung cancer Father 105  . Lung cancer Sister 44       adenocarcinoma (also a smoker)  . Lupus Maternal Uncle   . Colon cancer Maternal Uncle        dx in his 31s  . Cancer Paternal Uncle        2 paternal  uncles with cancer NOS  . Diabetes Maternal Grandmother   . Colon cancer Maternal Grandfather        late 66s  . Diabetes Paternal Grandmother   . Colon cancer Paternal Grandfather 73  . Breast cancer Sister 18  . Cancer Other   . Diabetes Other   . Mental illness Other     ROS: Review of Systems  Constitutional: Negative  for weight loss.  Gastrointestinal: Negative for nausea and vomiting.  Genitourinary:       Negative for polyuria.  Musculoskeletal:       Negative for muscle weakness.  Endo/Heme/Allergies: Negative for polydipsia.       Negative for hypoglycemia.  Psychiatric/Behavioral: Positive for depression.    PHYSICAL EXAM: Blood pressure 133/83, pulse 86, temperature 98.6 F (37 C), temperature source Oral, height _0  (1.626 m), weight 234 lb (106.1 kg), SpO2 96 %. Body mass index is 40.17 kg/m. Physical Exam  Constitutional: She is oriented to person, place, and time. She appears well-developed and well-nourished.  Cardiovascular: Normal rate.  Musculoskeletal: Normal range of motion.  Neurological: She is oriented to person, place, and time.  Skin: Skin is warm and dry.  Psychiatric: She has a normal mood and affect. Her behavior is normal.  Vitals reviewed.   RECENT LABS AND TESTS: BMET    Component Value Date/Time   NA 139 08/08/2017 0844   K 5.2 08/08/2017 0844   CL 98 08/08/2017 0844   CO2 26 08/08/2017 0844   GLUCOSE 87 08/08/2017 0844   GLUCOSE 112 (H) 12/02/2016 1756   BUN 17 08/08/2017 0844   CREATININE 0.72 08/08/2017 0844   CALCIUM 9.6 08/08/2017 0844   GFRNONAA 97 08/08/2017 0844   GFRAA 111 08/08/2017 0844   Lab Results  Component Value Date   HGBA1C 5.6 02/02/2018   HGBA1C 5.7 (H) 08/08/2017   HGBA1C 6.0 (H) 04/11/2017   Lab Results  Component Value Date   INSULIN 12.3 02/02/2018   INSULIN 11.5 08/08/2017   INSULIN 23.7 04/11/2017   CBC    Component Value Date/Time   WBC 7.4 04/11/2017 1040   WBC 6.0 12/02/2016 1756    RBC 4.51 04/11/2017 1040   RBC 4.20 12/02/2016 1756   HGB 13.9 04/11/2017 1040   HGB 14.4 12/25/2010 1504   HCT 41.6 04/11/2017 1040   HCT 41.5 12/25/2010 1504   PLT 316 12/02/2016 1756   PLT 329 12/25/2010 1504   MCV 92 04/11/2017 1040   MCV 94.7 12/25/2010 1504   MCH 30.8 04/11/2017 1040   MCH 31.7 12/02/2016 1756   MCHC 33.4 04/11/2017 1040   MCHC 33.3 12/02/2016 1756   RDW 13.6 04/11/2017 1040   RDW 13.0 12/25/2010 1504   LYMPHSABS 1.2 04/11/2017 1040   LYMPHSABS 1.7 12/25/2010 1504   MONOABS 0.5 12/02/2016 1756   MONOABS 0.8 12/25/2010 1504   EOSABS 0.2 04/11/2017 1040   BASOSABS 0.0 04/11/2017 1040   BASOSABS 0.0 12/25/2010 1504   Iron/TIBC/Ferritin/ %Sat No results found for: IRON, TIBC, FERRITIN, IRONPCTSAT Lipid Panel     Component Value Date/Time   CHOL 164 08/08/2017 0844   TRIG 106 08/08/2017 0844   HDL 46 08/08/2017 0844   CHOLHDL 3 03/26/2016 1704   VLDL 23.0 03/26/2016 1704   LDLCALC 97 08/08/2017 0844   Hepatic Function Panel     Component Value Date/Time   PROT 6.6 08/08/2017 0844   ALBUMIN 4.4 08/08/2017 0844   AST 16 08/08/2017 0844   ALT 21 08/08/2017 0844   ALKPHOS 80 08/08/2017 0844   BILITOT 0.2 08/08/2017 0844   BILIDIR 0.0 03/26/2016 1704      Component Value Date/Time   TSH 0.514 02/02/2018 0827   TSH 0.600 08/08/2017 0844   TSH 0.399 (L) 04/11/2017 1040   Results for LATASIA, SILBERSTEIN (MRN [id number]) as of 05/30/2018 15:15  Ref. Range 02/02/2018 08:27  Vitamin D, 25-Hydroxy Latest Ref Range: 30.0 -  100.0 ng/mL 25.2 (L)   ASSESSMENT AND PLAN: Prediabetes - Plan: metFORMIN (GLUCOPHAGE) 500 MG tablet  Vitamin D deficiency - Plan: calcitRIOL (ROCALTROL) 0.25 MCG capsule  Other depression - with emotional eating - Plan: buPROPion (WELLBUTRIN SR) 200 MG 12 hr tablet  At risk for diabetes mellitus  Class 3 severe obesity with serious comorbidity and body mass index (BMI) of 40.0 to 44.9 in adult, unspecified obesity type  ([place])  PLAN:  Pre-Diabetes Darrielle will continue to work on weight loss, exercise, and decreasing simple carbohydrates in her diet to help decrease the risk of diabetes. She was informed that eating too many simple carbohydrates or too many calories at one sitting increases the likelihood of GI side effects. Marialuiza agreed to metformin 550m qAM #30 with no refills and a prescription was written today. We will check labs next month. KAlayasiaagreed to follow up with u[place] directed to monitor her progress in 3 weeks.  Diabetes risk counseling KKayannawas given extended (15 minutes) diabetes prevention counseling today. She is 52y.o. female and has risk factors for diabetes including pre-diabetes and obesity. We discussed intensive lifestyle modifications today with an emphasis on weight loss as well as increasing exercise and decreasing simple carbohydrates in her diet.  Vitamin D Deficiency KAmanadawas informed that low vitamin D levels contributes to fatigue and are associated with obesity, breast, and colon cancer. She agrees to continue to take prescription Vit D _0 ,000 IU every week #4 with no refills and will follow up for routine testing of vitamin D, at least 2-3 times per year. She was informed of the risk of over-replacement of vitamin D and agrees to not increase her dose unless she discusses this with u[place]irst. KAslinagrees to follow up as directed and we will check labs next month..  Depression with Emotional Eating Behaviors We discussed behavior modification techniques today to help KBellamariedeal with her emotional eating and depression. She has agreed to take Wellbutrin SR 2043mqd #30 with no refills and agreed to follow up as directed.  Obesity KiAudrinas currently in the action stage of change. As such, her goal is to continue with weight loss efforts. She has agreed to follow the Category 3 plan. KiFletaas been instructed to work up to a goal of 150 minutes of  combined cardio and strengthening exercise per week for weight loss and overall health benefits. We discussed the following Behavioral Modification Strategies today: increasing lean protein intake, decreasing simple carbohydrates, work on meal planning and easy cooking plans, holiday eating strategies, and emotional eating strategies.  KiRayanaas agreed to follow up with our clinic in 3 weeks for a fasting appointment. She was informed of the importance of frequent follow up visits to maximize her success with intensive lifestyle modifications for her multiple health conditions.   OBESITY BEHAVIORAL INTERVENTION VISIT  Today's visit was # 15   Starting weight: 262 lbs Starting date: 04/11/17 Today's weight : Weight: 234 lb (106.1 kg)  Today's date: 05/25/2018 Total lbs lost to date: 2841 ASK: We discussed the diagnosis of obesity with KiElvina Sidleoday and KiDanilynngreed to give usKoreaermission to discuss obesity behavioral modification therapy today.  ASSESS: KiAlishahas the diagnosis of obesity and her BMI today is 40.15. KiShakoras in the action stage of change.   ADVISE: KiDenetriaas educated on the multiple health risks of obesity as well as the benefit of weight loss to improve her health. She was  advised of the need for long term treatment and the importance of lifestyle modifications to improve her current health and to decrease her risk of future health problems.  AGREE: Multiple dietary modification options and treatment options were discussed and Stavroula agreed to follow the recommendations documented in the above note.  ARRANGE: Laiba was educated on the importance of frequent visits to treat obesity as outlined per CMS and USPSTF guidelines and agreed to schedule her next follow up appointment today.  I, Marcille Blanco, am acting as transcriptionist for Starlyn Skeans, MD  I have reviewed the above documentation for accuracy and completeness, and I agree with  the above. -Dennard Nip, MD

## 2018-05-31 ENCOUNTER — Ambulatory Visit: Payer: BLUE CROSS/BLUE SHIELD | Admitting: Internal Medicine

## 2018-05-31 ENCOUNTER — Encounter: Payer: Self-pay | Admitting: Internal Medicine

## 2018-05-31 DIAGNOSIS — K2100 Gastro-esophageal reflux disease with esophagitis, without bleeding: Secondary | ICD-10-CM

## 2018-05-31 DIAGNOSIS — E038 Other specified hypothyroidism: Secondary | ICD-10-CM | POA: Diagnosis not present

## 2018-05-31 MED ORDER — OMEPRAZOLE 20 MG PO CPDR: 20.0000 mg | DELAYED_RELEASE_CAPSULE | Freq: Every day | ORAL | 3 refills | Status: DC

## 2018-05-31 NOTE — Assessment & Plan Note (Signed)
Prilosec 

## 2018-05-31 NOTE — Assessment & Plan Note (Signed)
Levothroid 

## 2018-05-31 NOTE — Progress Notes (Signed)
Subjective:  Patient ID: Tiffany Medina, female    DOB: Oct 03, 1965  Age: 52 y.o. MRN: [id number]  CC: No chief complaint on file.   HPI Tiffany Medina presents for anxiety, depression, hypothyroidism f/u Sick after working 3d shift - weak, n/v  Outpatient Medications Prior to Visit  Medication Sig Dispense Refill  . Albuterol Sulfate (PROAIR RESPICLICK) 108 (90 Base) MCG/ACT AEPB Inhale 1-2 puffs into the lungs 4 (four) times daily as needed. 3 each 0  . ALPRAZolam (XANAX) 0.5 MG tablet TAKE 1 OR 2 TABLETS BY MOUTH TWICE DAILY AS NEEDED FOR insomnia, anxiety 90 tablet 2  . Armodafinil 200 MG TABS Take 1 tablet by mouth daily. 90 tablet 1  . aspirin (GOODSENSE ASPIRIN) 325 MG tablet Take 1 tablet (325 mg total) by mouth 2 (two) times daily. 100 tablet 3  . Black Cohosh 540 MG CAPS 1 po qhs 90 capsule 3  . buPROPion (WELLBUTRIN SR) 200 MG 12 hr tablet Take 1 tablet (200 mg total) by mouth daily. 30 tablet 0  . calcitRIOL (ROCALTROL) 0.25 MCG capsule Take 1 capsule (0.25 mcg total) by mouth 2 (two) times daily. 60 capsule 0  . Cyanocobalamin 1000 MCG SUBL Take 1 tablet (1,000 mcg total) by mouth daily. 90 each 3  . diphenhydrAMINE (BENADRYL) 25 MG tablet Take 1 tablet (25 mg total) by mouth every 6 (six) hours as needed for allergies. 100 tablet 3  . gabapentin (NEURONTIN) 300 MG capsule TAKE 1 TO 2 CAPSULES AT BEDTIME FOR NERVE PAIN 270 capsule 3  . ibuprofen (ADVIL,MOTRIN) 600 MG tablet   0  . levothyroxine (SYNTHROID, LEVOTHROID) 100 MCG tablet Take 1 tablet (100 mcg total) by mouth daily before breakfast. 90 tablet 3  . loratadine (CLARITIN) 10 MG tablet Take 1 tablet (10 mg total) by mouth daily. 90 tablet 1  . metFORMIN (GLUCOPHAGE) 500 MG tablet Take 1 tablet (500 mg total) by mouth daily with breakfast. 30 tablet 0  . omeprazole (PRILOSEC) 20 MG capsule Take 20 mg by mouth daily.    . traMADol (ULTRAM) 50 MG tablet Take 1 tablet (50 mg total) by mouth 2 (two) times daily. 180 tablet  1  . benzonatate (TESSALON) 200 MG capsule TAKE 1 CAPSULE BY MOUTH 3 TIMES DAILY AS NEEDED FOR COUGH  0  . fluconazole (DIFLUCAN) 150 MG tablet TAKE 1 TABLET BY MOUTH ONCE, MAY REPEAT IN 3 DAYS IF NEEDED  0  . guaiFENesin (MUCINEX) 600 MG 12 hr tablet Take by mouth.    . predniSONE (DELTASONE) 20 MG tablet TAKE 2 TABLETS BY MOUTH DAILY FOR 5 DAYS  0  . promethazine-codeine (PHENERGAN WITH CODEINE) 6.25-10 MG/5ML syrup Take 5 mLs by mouth every 4 (four) hours as needed. 300 mL 0   No facility-administered medications prior to visit.     ROS: Review of Systems  Constitutional: Positive for diaphoresis and fatigue. Negative for activity change, appetite change, chills and unexpected weight change.  HENT: Negative for congestion, mouth sores and sinus pressure.   Eyes: Negative for visual disturbance.  Respiratory: Negative for cough and chest tightness.   Gastrointestinal: Negative for abdominal pain and nausea.  Genitourinary: Negative for difficulty urinating, frequency and vaginal pain.  Musculoskeletal: Positive for back pain. Negative for gait problem.  Skin: Negative for pallor and rash.  Neurological: Negative for dizziness, tremors, weakness, numbness and headaches.  Psychiatric/Behavioral: Positive for decreased concentration, dysphoric mood and sleep disturbance. Negative for confusion and suicidal ideas. The patient is nervous/anxious.  Objective:  BP 130/82 (BP Location: Left Arm, Patient Position: Sitting, Cuff Size: Normal)   Pulse 86   Temp 97.7 F (36.5 C) (Oral)   Ht 5\' 4"  (1.626 m)   Wt 239 lb (108.4 kg)   SpO2 96%   BMI 41.02 kg/m   BP Readings from Last 3 Encounters:  05/31/18 130/82  05/25/18 133/83  05/04/18 127/76    Wt Readings from Last 3 Encounters:  05/31/18 239 lb (108.4 kg)  05/25/18 234 lb (106.1 kg)  05/04/18 233 lb (105.7 kg)    Physical Exam  Constitutional: She appears well-developed. No distress.  HENT:  Head: Normocephalic.  Right  Ear: External ear normal.  Left Ear: External ear normal.  Nose: Nose normal.  Mouth/Throat: Oropharynx is clear and moist.  Eyes: Pupils are equal, round, and reactive to light. Conjunctivae are normal. Right eye exhibits no discharge. Left eye exhibits no discharge.  Neck: Normal range of motion. Neck supple. No JVD present. No tracheal deviation present. No thyromegaly present.  Cardiovascular: Normal rate, regular rhythm and normal heart sounds.  Pulmonary/Chest: No stridor. No respiratory distress. She has no wheezes.  Abdominal: Soft. Bowel sounds are normal. She exhibits no distension and no mass. There is no tenderness. There is no rebound and no guarding.  Musculoskeletal: She exhibits tenderness. She exhibits no edema.  Lymphadenopathy:    She has no cervical adenopathy.  Neurological: She displays normal reflexes. No cranial nerve deficit. She exhibits normal muscle tone. Coordination normal.  Skin: No rash noted. No erythema.  Psychiatric: She has a normal mood and affect. Her behavior is normal. Judgment and thought content normal.    Lab Results  Component Value Date   WBC 7.4 04/11/2017   HGB 13.9 04/11/2017   HCT 41.6 04/11/2017   PLT 316 12/02/2016   GLUCOSE 87 08/08/2017   CHOL 164 08/08/2017   TRIG 106 08/08/2017   HDL 46 08/08/2017   LDLCALC 97 08/08/2017   ALT 21 08/08/2017   AST 16 08/08/2017   NA 139 08/08/2017   K 5.2 08/08/2017   CL 98 08/08/2017   CREATININE 0.72 08/08/2017   BUN 17 08/08/2017   CO2 26 08/08/2017   TSH 0.514 02/02/2018   INR 1.02 11/28/2014   HGBA1C 5.6 02/02/2018    Dg Finger Index Right  Result Date: 10/14/2017 CLINICAL DATA:  Slammed finger in car door today. Swelling, and pain since. EXAM: RIGHT INDEX FINGER 2+V COMPARISON:  None. FINDINGS: No fracture.  No bone lesion. Joints are normally spaced and aligned.  No arthropathic changes. There is soft tissue swelling. Soft tissue air is seen along the proximal to mid aspect of the  finger, mostly along the palmar surface. No radiopaque foreign body. IMPRESSION: 1. No fracture or dislocation. 2. Soft tissue swelling and soft tissue air, the latter consistent with a laceration. No radiopaque foreign body. Electronically Signed   By: Amie Portlandavid  Ormond M.D.   On: 10/14/2017 08:17    Assessment & Plan:   There are no diagnoses linked to this encounter.   No orders of the defined types were placed in this encounter.    Follow-up: No follow-ups on file.  Sonda PrimesAlex Victor Langenbach, MD

## 2018-06-06 ENCOUNTER — Encounter (INDEPENDENT_AMBULATORY_CARE_PROVIDER_SITE_OTHER): Payer: Self-pay | Admitting: Family Medicine

## 2018-06-16 DIAGNOSIS — Z86718 Personal history of other venous thrombosis and embolism: Secondary | ICD-10-CM | POA: Diagnosis not present

## 2018-06-16 DIAGNOSIS — S9031XA Contusion of right foot, initial encounter: Secondary | ICD-10-CM | POA: Diagnosis not present

## 2018-06-16 DIAGNOSIS — Z79899 Other long term (current) drug therapy: Secondary | ICD-10-CM | POA: Diagnosis not present

## 2018-06-16 DIAGNOSIS — F17210 Nicotine dependence, cigarettes, uncomplicated: Secondary | ICD-10-CM | POA: Diagnosis not present

## 2018-06-16 DIAGNOSIS — Z888 Allergy status to other drugs, medicaments and biological substances status: Secondary | ICD-10-CM | POA: Diagnosis not present

## 2018-06-16 DIAGNOSIS — J45909 Unspecified asthma, uncomplicated: Secondary | ICD-10-CM | POA: Diagnosis not present

## 2018-06-16 DIAGNOSIS — G8911 Acute pain due to trauma: Secondary | ICD-10-CM | POA: Diagnosis not present

## 2018-06-16 DIAGNOSIS — M329 Systemic lupus erythematosus, unspecified: Secondary | ICD-10-CM | POA: Diagnosis not present

## 2018-06-16 DIAGNOSIS — S99921A Unspecified injury of right foot, initial encounter: Secondary | ICD-10-CM | POA: Diagnosis not present

## 2018-06-21 ENCOUNTER — Telehealth (INDEPENDENT_AMBULATORY_CARE_PROVIDER_SITE_OTHER): Payer: Self-pay | Admitting: Family Medicine

## 2018-06-21 ENCOUNTER — Other Ambulatory Visit (INDEPENDENT_AMBULATORY_CARE_PROVIDER_SITE_OTHER): Payer: Self-pay

## 2018-06-21 ENCOUNTER — Ambulatory Visit (INDEPENDENT_AMBULATORY_CARE_PROVIDER_SITE_OTHER): Payer: BLUE CROSS/BLUE SHIELD | Admitting: Family Medicine

## 2018-06-21 ENCOUNTER — Encounter (INDEPENDENT_AMBULATORY_CARE_PROVIDER_SITE_OTHER): Payer: Self-pay

## 2018-06-21 DIAGNOSIS — F3289 Other specified depressive episodes: Secondary | ICD-10-CM

## 2018-06-21 DIAGNOSIS — E559 Vitamin D deficiency, unspecified: Secondary | ICD-10-CM

## 2018-06-21 DIAGNOSIS — R7303 Prediabetes: Secondary | ICD-10-CM

## 2018-06-21 MED ORDER — CALCITRIOL 0.25 MCG PO CAPS: 0.2500 ug | ORAL_CAPSULE | Freq: Two times a day (BID) | ORAL | 0 refills | Status: DC

## 2018-06-21 MED ORDER — BUPROPION HCL ER (SR) 200 MG PO TB12: 200.0000 mg | ORAL_TABLET | Freq: Every day | ORAL | 0 refills | Status: DC

## 2018-06-21 MED ORDER — METFORMIN HCL 500 MG PO TABS: 500.0000 mg | ORAL_TABLET | Freq: Every day | ORAL | 0 refills | Status: DC

## 2018-06-21 NOTE — Telephone Encounter (Signed)
Sent the prescription to the pharmacy. April, CMA

## 2018-06-21 NOTE — Telephone Encounter (Signed)
Patient called requested 60 day supply for RX Wellbutrin, Calcitriol & Metformin due to husband's insurance will be changing. [facility] [place]. Any questions patient may be reached at  [phone]

## 2018-06-22 ENCOUNTER — Ambulatory Visit: Payer: BLUE CROSS/BLUE SHIELD | Admitting: Internal Medicine

## 2018-06-22 ENCOUNTER — Encounter: Payer: Self-pay | Admitting: Internal Medicine

## 2018-06-22 DIAGNOSIS — S9030XA Contusion of unspecified foot, initial encounter: Secondary | ICD-10-CM | POA: Insufficient documentation

## 2018-06-22 DIAGNOSIS — R739 Hyperglycemia, unspecified: Secondary | ICD-10-CM

## 2018-06-22 DIAGNOSIS — S9031XA Contusion of right foot, initial encounter: Secondary | ICD-10-CM | POA: Diagnosis not present

## 2018-06-22 DIAGNOSIS — K2100 Gastro-esophageal reflux disease with esophagitis, without bleeding: Secondary | ICD-10-CM

## 2018-06-22 DIAGNOSIS — M5440 Lumbago with sciatica, unspecified side: Secondary | ICD-10-CM

## 2018-06-22 DIAGNOSIS — G8929 Other chronic pain: Secondary | ICD-10-CM

## 2018-06-22 MED ORDER — ARMODAFINIL 200 MG PO TABS: 1.0000 | ORAL_TABLET | Freq: Every day | ORAL | 1 refills | Status: DC

## 2018-06-22 MED ORDER — OMEPRAZOLE 20 MG PO CPDR: 20.0000 mg | DELAYED_RELEASE_CAPSULE | Freq: Every day | ORAL | 3 refills | Status: DC

## 2018-06-22 MED ORDER — ALPRAZOLAM 0.5 MG PO TABS: ORAL_TABLET | ORAL | 2 refills | Status: DC

## 2018-06-22 MED ORDER — TRAMADOL HCL 50 MG PO TABS: 50.0000 mg | ORAL_TABLET | Freq: Two times a day (BID) | ORAL | 1 refills | Status: DC

## 2018-06-22 MED ORDER — BLACK COHOSH 540 MG PO CAPS: ORAL_CAPSULE | ORAL | 3 refills | Status: DC

## 2018-06-22 MED ORDER — LEVOTHYROXINE SODIUM 100 MCG PO TABS: 100.0000 ug | ORAL_TABLET | Freq: Every day | ORAL | 3 refills | Status: DC

## 2018-06-22 MED ORDER — GABAPENTIN 300 MG PO CAPS: ORAL_CAPSULE | ORAL | 3 refills | Status: DC

## 2018-06-22 MED ORDER — ASPIRIN 325 MG PO TABS: 325.0000 mg | ORAL_TABLET | Freq: Two times a day (BID) | ORAL | 3 refills | Status: DC

## 2018-06-22 NOTE — Assessment & Plan Note (Signed)
Not better in 1 wk X ray was (-) Sports med ref r/o a crack/fx

## 2018-06-22 NOTE — Assessment & Plan Note (Signed)
Protonix.  ?

## 2018-06-22 NOTE — Assessment & Plan Note (Signed)
Tramadol prn ° Potential benefits of a long term opioids use as well as potential risks (i.e. addiction risk, apnea etc) and complications (i.e. Somnolence, constipation and others) were explained to the patient and were aknowledged. ° ° °

## 2018-06-22 NOTE — Assessment & Plan Note (Signed)
Monitoring Labs 

## 2018-06-22 NOTE — Progress Notes (Signed)
Subjective:  Patient ID: Tiffany Medina, female    DOB: 1966-01-05  Age: 52 y.o. MRN: [id number]  CC: No chief complaint on file.   HPI Tiffany Medina presents for chronic pain, shift work, B 12 def C/o R foot injury 1 wk ago. X ray was nl.   Outpatient Medications Prior to Visit  Medication Sig Dispense Refill  . Albuterol Sulfate (PROAIR RESPICLICK) 108 (90 Base) MCG/ACT AEPB Inhale 1-2 puffs into the lungs 4 (four) times daily as needed. 3 each 0  . buPROPion (WELLBUTRIN SR) 200 MG 12 hr tablet Take 1 tablet (200 mg total) by mouth daily. 30 tablet 0  . calcitRIOL (ROCALTROL) 0.25 MCG capsule Take 1 capsule (0.25 mcg total) by mouth 2 (two) times daily. 60 capsule 0  . Cyanocobalamin 1000 MCG SUBL Take 1 tablet (1,000 mcg total) by mouth daily. 90 each 3  . diphenhydrAMINE (BENADRYL) 25 MG tablet Take 1 tablet (25 mg total) by mouth every 6 (six) hours as needed for allergies. 100 tablet 3  . loratadine (CLARITIN) 10 MG tablet Take 1 tablet (10 mg total) by mouth daily. 90 tablet 1  . metFORMIN (GLUCOPHAGE) 500 MG tablet Take 1 tablet (500 mg total) by mouth daily with breakfast. 30 tablet 0  . ALPRAZolam (XANAX) 0.5 MG tablet TAKE 1 OR 2 TABLETS BY MOUTH TWICE DAILY AS NEEDED FOR insomnia, anxiety 90 tablet 2  . Armodafinil 200 MG TABS Take 1 tablet by mouth daily. 90 tablet 1  . aspirin (GOODSENSE ASPIRIN) 325 MG tablet Take 1 tablet (325 mg total) by mouth 2 (two) times daily. 100 tablet 3  . Black Cohosh 540 MG CAPS 1 po qhs 90 capsule 3  . gabapentin (NEURONTIN) 300 MG capsule TAKE 1 TO 2 CAPSULES AT BEDTIME FOR NERVE PAIN 270 capsule 3  . ibuprofen (ADVIL,MOTRIN) 600 MG tablet   0  . levothyroxine (SYNTHROID, LEVOTHROID) 100 MCG tablet Take 1 tablet (100 mcg total) by mouth daily before breakfast. 90 tablet 3  . omeprazole (PRILOSEC) 20 MG capsule Take 1 capsule (20 mg total) by mouth daily. 90 capsule 3  . traMADol (ULTRAM) 50 MG tablet Take 1 tablet (50 mg total) by mouth 2  (two) times daily. 180 tablet 1   No facility-administered medications prior to visit.     ROS: Review of Systems  Constitutional: Positive for fatigue. Negative for activity change, appetite change, chills and unexpected weight change.  HENT: Negative for congestion, mouth sores and sinus pressure.   Eyes: Negative for visual disturbance.  Respiratory: Negative for cough and chest tightness.   Gastrointestinal: Negative for abdominal pain and nausea.  Genitourinary: Negative for difficulty urinating, frequency and vaginal pain.  Musculoskeletal: Positive for arthralgias and back pain. Negative for gait problem.  Skin: Negative for pallor and rash.  Neurological: Negative for dizziness, tremors, weakness, numbness and headaches.  Psychiatric/Behavioral: Positive for decreased concentration. Negative for confusion, sleep disturbance and suicidal ideas. The patient is nervous/anxious.     Objective:  BP 128/82 (BP Location: Left Arm, Patient Position: Sitting, Cuff Size: Large)   Pulse 83   Temp 97.7 F (36.5 C) (Oral)   Ht 5\' 4"  (1.626 m)   Wt 242 lb (109.8 kg)   SpO2 99%   BMI 41.54 kg/m   BP Readings from Last 3 Encounters:  06/22/18 128/82  05/31/18 130/82  05/25/18 133/83    Wt Readings from Last 3 Encounters:  06/22/18 242 lb (109.8 kg)  05/31/18 239 lb (108.4 kg)  05/25/18 234 lb (106.1 kg)    Physical Exam Constitutional:      General: She is not in acute distress.    Appearance: She is well-developed.  HENT:     Head: Normocephalic.     Right Ear: External ear normal.     Left Ear: External ear normal.     Nose: Nose normal.  Eyes:     General:        Right eye: No discharge.        Left eye: No discharge.     Conjunctiva/sclera: Conjunctivae normal.     Pupils: Pupils are equal, round, and reactive to light.  Neck:     Musculoskeletal: Normal range of motion and neck supple.     Thyroid: No thyromegaly.     Vascular: No JVD.     Trachea: No  tracheal deviation.  Cardiovascular:     Rate and Rhythm: Normal rate and regular rhythm.     Heart sounds: Normal heart sounds.  Pulmonary:     Effort: No respiratory distress.     Breath sounds: No stridor. No wheezing.  Abdominal:     General: Bowel sounds are normal. There is no distension.     Palpations: Abdomen is soft. There is no mass.     Tenderness: There is no abdominal tenderness. There is no guarding or rebound.  Musculoskeletal:        General: No tenderness.  Lymphadenopathy:     Cervical: No cervical adenopathy.  Skin:    Findings: No erythema or rash.  Neurological:     Cranial Nerves: No cranial nerve deficit.     Motor: No abnormal muscle tone.     Coordination: Coordination normal.     Deep Tendon Reflexes: Reflexes normal.  Psychiatric:        Behavior: Behavior normal.        Thought Content: Thought content normal.        Judgment: Judgment normal.   R foot is bruised. Midfoot and toes - tender   Lab Results  Component Value Date   WBC 7.4 04/11/2017   HGB 13.9 04/11/2017   HCT 41.6 04/11/2017   PLT 316 12/02/2016   GLUCOSE 87 08/08/2017   CHOL 164 08/08/2017   TRIG 106 08/08/2017   HDL 46 08/08/2017   LDLCALC 97 08/08/2017   ALT 21 08/08/2017   AST 16 08/08/2017   NA 139 08/08/2017   K 5.2 08/08/2017   CL 98 08/08/2017   CREATININE 0.72 08/08/2017   BUN 17 08/08/2017   CO2 26 08/08/2017   TSH 0.514 02/02/2018   INR 1.02 11/28/2014   HGBA1C 5.6 02/02/2018    Dg Finger Index Right  Result Date: 10/14/2017 CLINICAL DATA:  Slammed finger in car door today. Swelling, and pain since. EXAM: RIGHT INDEX FINGER 2+V COMPARISON:  None. FINDINGS: No fracture.  No bone lesion. Joints are normally spaced and aligned.  No arthropathic changes. There is soft tissue swelling. Soft tissue air is seen along the proximal to mid aspect of the finger, mostly along the palmar surface. No radiopaque foreign body. IMPRESSION: 1. No fracture or dislocation. 2.  Soft tissue swelling and soft tissue air, the latter consistent with a laceration. No radiopaque foreign body. Electronically Signed   By: Amie Portland M.D.   On: 10/14/2017 08:17    Assessment & Plan:   There are no diagnoses linked to this encounter.   Meds ordered this encounter  Medications  . ALPRAZolam (  XANAX) 0.5 MG tablet    Sig: TAKE 1 OR 2 TABLETS BY MOUTH TWICE DAILY AS NEEDED FOR insomnia, anxiety    Dispense:  90 tablet    Refill:  2  . Armodafinil 200 MG TABS    Sig: Take 1 tablet by mouth daily.    Dispense:  90 tablet    Refill:  1  . aspirin (GOODSENSE ASPIRIN) 325 MG tablet    Sig: Take 1 tablet (325 mg total) by mouth 2 (two) times daily.    Dispense:  100 tablet    Refill:  3    This prescription was filled on 12/17/2016. Any refills authorized will be placed on file.  . Black Cohosh 540 MG CAPS    Sig: 1 po qhs    Dispense:  90 capsule    Refill:  3  . gabapentin (NEURONTIN) 300 MG capsule    Sig: TAKE 1 TO 2 CAPSULES AT BEDTIME FOR NERVE PAIN    Dispense:  270 capsule    Refill:  3    This prescription was filled on 07/04/2016. Any refills authorized will be placed on file.  . levothyroxine (SYNTHROID, LEVOTHROID) 100 MCG tablet    Sig: Take 1 tablet (100 mcg total) by mouth daily before breakfast.    Dispense:  90 tablet    Refill:  3  . omeprazole (PRILOSEC) 20 MG capsule    Sig: Take 1 capsule (20 mg total) by mouth daily.    Dispense:  90 capsule    Refill:  3  . traMADol (ULTRAM) 50 MG tablet    Sig: Take 1 tablet (50 mg total) by mouth 2 (two) times daily.    Dispense:  180 tablet    Refill:  1     Follow-up: No follow-ups on file.  Sonda PrimesAlex Dacen Frayre, MD

## 2018-06-23 ENCOUNTER — Ambulatory Visit: Payer: BLUE CROSS/BLUE SHIELD | Admitting: Family Medicine

## 2018-06-23 ENCOUNTER — Encounter: Payer: Self-pay | Admitting: Family Medicine

## 2018-06-23 VITALS — BP 150/102 | HR 87 | Resp 16 | Wt 243.0 lb

## 2018-06-23 DIAGNOSIS — M79671 Pain in right foot: Secondary | ICD-10-CM | POA: Diagnosis not present

## 2018-06-23 MED ORDER — DICLOFENAC SODIUM 2 % TD SOLN: 1.0000 "application " | Freq: Two times a day (BID) | TRANSDERMAL | 3 refills | Status: DC

## 2018-06-23 NOTE — Progress Notes (Signed)
Tiffany Medina - 52 y.o. female MRN [id number]  Date of birth: [birth year]  SUBJECTIVE:  Including CC & ROS.  No chief complaint on file.   Tiffany Medina is a 52 y.o. female that is presenting with right foot pain.  She is having pain over the fourth and fifth toe on the right foot.  She was seen in the emergency department on 12/13.  She has been having bruising and swelling over the fifth digit.  Pain is severe in nature and is affecting her walk.  Review of the right foot x-ray from 12/13 shows no acute fracture.   Review of Systems  Constitutional: Negative for fever.  HENT: Negative for congestion.   Respiratory: Negative for cough.   Cardiovascular: Negative for chest pain.  Gastrointestinal: Negative for abdominal pain.  Musculoskeletal: Positive for gait problem and joint swelling.  Skin: Positive for color change.  Neurological: Negative for weakness.  Hematological: Negative for adenopathy.  Psychiatric/Behavioral: Negative for agitation.    HISTORY: Past Medical, Surgical, Social, and Family History Reviewed & Updated per EMR.   Pertinent Historical Findings include:  Past Medical History:  Diagnosis Date  . Ankle pain   . Anxiety   . Asthma   . Back pain   . Chest pain   . Chronic fatigue syndrome   . Constipation   . COPD (chronic obstructive pulmonary disease) ([place])   . Depression   . Discoid lupus   . DVT (deep venous thrombosis) ([place])   . Dyspnea   . Family history of breast cancer   . Family history of colon cancer   . Genetic testing 06/20/2015   Negative genetic testing on the Breast/Ovarian cancer panel.  The Breast/Ovarian gene panel offered by [facility] includes sequencing and rearrangement analysis for the following 20 genes:  ATM, BARD1, BRCA1, BRCA2, BRIP1, CDH1, CHEK2, EPCAM, FANCC, MLH1, MSH2, MSH6, NBN, PALB2, PMS2, PTEN, RAD51C, RAD51D, TP53, and XRCC2.   The report date is June 19, 2015.   Marland Kitchen GERD (gastroesophageal reflux disease)   . Hot  flashes   . Hypothyroidism   . Insomnia   . Leg edema   . Lupus ([place])   . Neuropathy   . Obesity   . Sciatica   . Sleep apnea     Past Surgical History:  Procedure Laterality Date  . ABDOMINAL HYSTERECTOMY    . APPENDECTOMY    . KNEE ARTHROSCOPY W/ MENISCAL REPAIR    . TUBAL LIGATION  1987  . VEIN LIGATION AND STRIPPING      Allergies  Allergen Reactions  . Dihydrotachysterol Hives, Itching and Rash  . Vitamin D Analogs Hives, Itching and Rash    Flushing    Family History  Problem Relation Age of Onset  . Breast cancer Mother 23       bilateral breast cancer  . Hodgkin's lymphoma Father        dx in his 34s  . Lung cancer Father 29  . Lung cancer Sister 102       adenocarcinoma (also a smoker)  . Lupus Maternal Uncle   . Colon cancer Maternal Uncle        dx in his 2s  . Cancer Paternal Uncle        2 paternal uncles with cancer NOS  . Diabetes Maternal Grandmother   . Colon cancer Maternal Grandfather        late 44s  . Diabetes Paternal Grandmother   . Colon cancer Paternal Grandfather 40  .  Breast cancer Sister 34  . Cancer Other   . Diabetes Other   . Mental illness Other      Social History   Socioeconomic History  . Marital status: Married    Spouse name: Toby  . Number of children: 1  . Years of education: College  . Highest education level: Not on file  Occupational History  . 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 Financial resource strain: Not on file  . Food insecurity:    Worry: Not on file    Inability: Not on file  . Transportation needs:    Medical: Not on file    Non-medical: Not on file  Tobacco Use  . Smoking status: Current Every Day Smoker    Packs/day: 1.00    Types: Cigarettes  . Smokeless tobacco: Never Used  Substance and Sexual Activity  . Alcohol use: No    Alcohol/week: 0.0 standard drinks  . Drug use: No  . Sexual activity: Yes  Lifestyle  . Physical activity:    Days per week: Not on  file    Minutes per session: Not on file  . Stress: Not on file  Relationships  . Social connections:    Talks on phone: Not on file    Gets together: Not on file    Attends religious service: Not on file    Active member of club or organization: Not on file    Attends meetings of clubs or organizations: Not on file    Relationship status: Not on file  . Intimate partner violence:    Fear of current or ex partner: Not on file    Emotionally abused: Not on file    Physically abused: Not on file    Forced sexual activity: Not on file  Other Topics Concern  . Not on file  Social History Narrative   Drinks 2 cups of coffee a day and 2 Dt Mt Dews     PHYSICAL EXAM:  VS: BP (!) 150/102   Pulse 87   Resp 16   Wt 243 lb (110.2 kg)   SpO2 98%   BMI 41.71 kg/m  Physical Exam Gen: NAD, alert, cooperative with exam, well-appearing ENT: normal lips, normal nasal mucosa,  Eye: normal EOM, normal conjunctiva and lids CV:  no edema, +2 pedal pulses   Resp: no accessory muscle use, non-labored,  Skin: no rashes, no areas of induration  Neuro: normal tone, normal sensation to touch Psych:  normal insight, alert and oriented MSK:  Right foot: Tenderness to palpation of the fifth phalange E. No significant tenderness over the fifth metatarsal. No tenderness palpation of the fourth metatarsal. Walking with a limp. No tenderness palpation over the fourth digit. Neurovascular intact  Limited ultrasound: Right foot:  The fifth proximal phalanx does not appear to have a fracture.  The fifth metatarsal does not appear to have a fracture.  There is no significant swelling of the fifth MCP joint.  There is soft tissue swelling of the fifth phalanges.  Summary: Soft tissue swelling was evident in the fifth toe  Ultrasound and interpretation by Clearance Coots, MD      ASSESSMENT & PLAN:   Right foot pain Xray was negative. Possible for joint contusion.  - post op shoe  - pennsaid    - counseled on supportive care  - f/u in two weeks. Would discontinue post op shoe. May need imaging if still in pain.

## 2018-06-23 NOTE — Assessment & Plan Note (Signed)
Xray was negative. Possible for joint contusion.  - post op shoe  - pennsaid  - counseled on supportive care  - f/u in two weeks. Would discontinue post op shoe. May need imaging if still in pain.

## 2018-06-23 NOTE — Patient Instructions (Signed)
Good to see you  Please try ice on the area  Please wear the post op shoe for two weeks.  Please see me back after that  Please try the rub on medicine  Happy holidays.

## 2018-07-07 ENCOUNTER — Ambulatory Visit: Payer: BLUE CROSS/BLUE SHIELD | Admitting: Family Medicine

## 2018-08-21 ENCOUNTER — Ambulatory Visit (INDEPENDENT_AMBULATORY_CARE_PROVIDER_SITE_OTHER): Payer: No Typology Code available for payment source | Admitting: Internal Medicine

## 2018-08-21 ENCOUNTER — Other Ambulatory Visit (INDEPENDENT_AMBULATORY_CARE_PROVIDER_SITE_OTHER): Payer: No Typology Code available for payment source

## 2018-08-21 ENCOUNTER — Encounter: Payer: Self-pay | Admitting: Internal Medicine

## 2018-08-21 VITALS — BP 140/88 | HR 100 | Temp 98.4°F | Ht 64.0 in | Wt 246.0 lb

## 2018-08-21 DIAGNOSIS — E038 Other specified hypothyroidism: Secondary | ICD-10-CM

## 2018-08-21 DIAGNOSIS — E559 Vitamin D deficiency, unspecified: Secondary | ICD-10-CM | POA: Diagnosis not present

## 2018-08-21 DIAGNOSIS — R0602 Shortness of breath: Secondary | ICD-10-CM

## 2018-08-21 DIAGNOSIS — G9332 Myalgic encephalomyelitis/chronic fatigue syndrome: Secondary | ICD-10-CM

## 2018-08-21 DIAGNOSIS — R5382 Chronic fatigue, unspecified: Secondary | ICD-10-CM

## 2018-08-21 DIAGNOSIS — R7303 Prediabetes: Secondary | ICD-10-CM

## 2018-08-21 DIAGNOSIS — F3289 Other specified depressive episodes: Secondary | ICD-10-CM | POA: Diagnosis not present

## 2018-08-21 DIAGNOSIS — G4733 Obstructive sleep apnea (adult) (pediatric): Secondary | ICD-10-CM | POA: Diagnosis not present

## 2018-08-21 DIAGNOSIS — F988 Other specified behavioral and emotional disorders with onset usually occurring in childhood and adolescence: Secondary | ICD-10-CM

## 2018-08-21 DIAGNOSIS — Z6838 Body mass index (BMI) 38.0-38.9, adult: Secondary | ICD-10-CM

## 2018-08-21 LAB — BASIC METABOLIC PANEL
BUN: 19 mg/dL (ref 6–23)
CO2: 28 mEq/L (ref 19–32)
Calcium: 9.7 mg/dL (ref 8.4–10.5)
Chloride: 103 mEq/L (ref 96–112)
Creatinine, Ser: 0.81 mg/dL (ref 0.40–1.20)
GFR: 73.74 mL/min (ref >60.00)
Glucose, Bld: 84 mg/dL (ref 70–99)
Potassium: 4.1 mEq/L (ref 3.5–5.1)
Sodium: 140 mEq/L (ref 135–145)

## 2018-08-21 LAB — HEPATIC FUNCTION PANEL
ALT: 22 U/L (ref 0–35)
AST: 20 U/L (ref 0–37)
Albumin: 4.5 g/dL (ref 3.5–5.2)
Alkaline Phosphatase: 86 U/L (ref 39–117)
BILIRUBIN TOTAL: 0.4 mg/dL (ref 0.2–1.2)
Bilirubin, Direct: 0.1 mg/dL (ref 0.0–0.3)
Total Protein: 7.3 g/dL (ref 6.0–8.3)

## 2018-08-21 MED ORDER — ARMODAFINIL 200 MG PO TABS: 1.0000 | ORAL_TABLET | Freq: Every day | ORAL | 1 refills | Status: DC

## 2018-08-21 MED ORDER — GABAPENTIN 300 MG PO CAPS: ORAL_CAPSULE | ORAL | 3 refills | Status: DC

## 2018-08-21 MED ORDER — ASPIRIN 325 MG PO TABS: 325.0000 mg | ORAL_TABLET | Freq: Every day | ORAL | 3 refills | Status: DC

## 2018-08-21 MED ORDER — METFORMIN HCL 500 MG PO TABS: 500.0000 mg | ORAL_TABLET | Freq: Every day | ORAL | 3 refills | Status: DC

## 2018-08-21 MED ORDER — TRAMADOL HCL 50 MG PO TABS: 50.0000 mg | ORAL_TABLET | Freq: Two times a day (BID) | ORAL | 1 refills | Status: DC

## 2018-08-21 MED ORDER — LEVOTHYROXINE SODIUM 100 MCG PO TABS: 100.0000 ug | ORAL_TABLET | Freq: Every day | ORAL | 3 refills | Status: DC

## 2018-08-21 MED ORDER — CYANOCOBALAMIN 1000 MCG SL SUBL: 1.0000 | SUBLINGUAL_TABLET | Freq: Every day | SUBLINGUAL | 3 refills | Status: DC

## 2018-08-21 MED ORDER — ALPRAZOLAM 0.5 MG PO TABS: ORAL_TABLET | ORAL | 2 refills | Status: DC

## 2018-08-21 MED ORDER — OMEPRAZOLE 20 MG PO CPDR: 20.0000 mg | DELAYED_RELEASE_CAPSULE | Freq: Every day | ORAL | 3 refills | Status: DC

## 2018-08-21 MED ORDER — CALCITRIOL 0.25 MCG PO CAPS: 0.2500 ug | ORAL_CAPSULE | Freq: Two times a day (BID) | ORAL | 0 refills | Status: DC

## 2018-08-21 MED ORDER — BUPROPION HCL ER (SR) 200 MG PO TB12: 200.0000 mg | ORAL_TABLET | Freq: Every day | ORAL | 3 refills | Status: DC

## 2018-08-21 NOTE — Assessment & Plan Note (Signed)
Not using CPAP F/u w/Pulmonology

## 2018-08-21 NOTE — Assessment & Plan Note (Signed)
Due to OSA, meds (chronic -Tramadol, Requip, Gabapentin, Trazodone) , other causes. Not using CPAP On Armodafinil

## 2018-08-21 NOTE — Assessment & Plan Note (Signed)
On Levothroid 

## 2018-08-21 NOTE — Assessment & Plan Note (Signed)
Off Adderall Monitor HR

## 2018-08-21 NOTE — Assessment & Plan Note (Signed)
Wt Readings from Last 3 Encounters:  08/21/18 246 lb (111.6 kg)  06/23/18 243 lb (110.2 kg)  06/22/18 242 lb (109.8 kg)

## 2018-08-21 NOTE — Progress Notes (Signed)
Subjective:  Patient ID: Tiffany Medina, female    DOB: 16-Jan-1966  Age: 53 y.o. MRN: [id number]  CC: No chief complaint on file.   HPI Tiffany Medina presents for depression/stress, shift work, anxiety f/u Switching jobs C/o stress  Outpatient Medications Prior to Visit  Medication Sig Dispense Refill  . Albuterol Sulfate (PROAIR RESPICLICK) 108 (90 Base) MCG/ACT AEPB Inhale 1-2 puffs into the lungs 4 (four) times daily as needed. 3 each 0  . Black Cohosh 540 MG CAPS 1 po qhs 90 capsule 3  . diphenhydrAMINE (BENADRYL) 25 MG tablet Take 1 tablet (25 mg total) by mouth every 6 (six) hours as needed for allergies. 100 tablet 3  . ALPRAZolam (XANAX) 0.5 MG tablet TAKE 1 OR 2 TABLETS BY MOUTH TWICE DAILY AS NEEDED FOR insomnia, anxiety 90 tablet 2  . Armodafinil 200 MG TABS Take 1 tablet by mouth daily. 90 tablet 1  . aspirin (GOODSENSE ASPIRIN) 325 MG tablet Take 1 tablet (325 mg total) by mouth 2 (two) times daily. 100 tablet 3  . buPROPion (WELLBUTRIN SR) 200 MG 12 hr tablet Take 1 tablet (200 mg total) by mouth daily. 30 tablet 0  . calcitRIOL (ROCALTROL) 0.25 MCG capsule Take 1 capsule (0.25 mcg total) by mouth 2 (two) times daily. 60 capsule 0  . Cyanocobalamin 1000 MCG SUBL Take 1 tablet (1,000 mcg total) by mouth daily. 90 each 3  . Diclofenac Sodium (PENNSAID) 2 % SOLN Place 1 application onto the skin 2 (two) times daily. 1 Bottle 3  . gabapentin (NEURONTIN) 300 MG capsule TAKE 1 TO 2 CAPSULES AT BEDTIME FOR NERVE PAIN 270 capsule 3  . levothyroxine (SYNTHROID, LEVOTHROID) 100 MCG tablet Take 1 tablet (100 mcg total) by mouth daily before breakfast. 90 tablet 3  . loratadine (CLARITIN) 10 MG tablet Take 1 tablet (10 mg total) by mouth daily. 90 tablet 1  . metFORMIN (GLUCOPHAGE) 500 MG tablet Take 1 tablet (500 mg total) by mouth daily with breakfast. 30 tablet 0  . omeprazole (PRILOSEC) 20 MG capsule Take 1 capsule (20 mg total) by mouth daily. 90 capsule 3  . traMADol (ULTRAM)  50 MG tablet Take 1 tablet (50 mg total) by mouth 2 (two) times daily. 180 tablet 1   No facility-administered medications prior to visit.     ROS: Review of Systems  Constitutional: Negative for activity change, appetite change, chills, fatigue and unexpected weight change.  HENT: Negative for congestion, mouth sores and sinus pressure.   Eyes: Negative for visual disturbance.  Respiratory: Negative for cough and chest tightness.   Gastrointestinal: Negative for abdominal pain and nausea.  Genitourinary: Negative for difficulty urinating, frequency and vaginal pain.  Musculoskeletal: Positive for arthralgias and back pain. Negative for gait problem.  Skin: Negative for pallor and rash.  Neurological: Negative for dizziness, tremors, weakness, numbness and headaches.  Psychiatric/Behavioral: Positive for dysphoric mood and sleep disturbance. Negative for confusion and suicidal ideas. The patient is nervous/anxious.     Objective:  BP 140/88 (BP Location: Left Arm, Patient Position: Sitting, Cuff Size: Large)   Pulse 100   Temp 98.4 F (36.9 C) (Oral)   Ht 5\' 4"  (1.626 m)   Wt 246 lb (111.6 kg)   SpO2 95%   BMI 42.23 kg/m   BP Readings from Last 3 Encounters:  08/21/18 140/88  06/23/18 (!) 150/102  06/22/18 128/82    Wt Readings from Last 3 Encounters:  08/21/18 246 lb (111.6 kg)  06/23/18 243 lb (110.2  kg)  06/22/18 242 lb (109.8 kg)    Physical Exam Constitutional:      General: She is not in acute distress.    Appearance: She is well-developed.  HENT:     Head: Normocephalic.     Right Ear: External ear normal.     Left Ear: External ear normal.     Nose: Nose normal.  Eyes:     General:        Right eye: No discharge.        Left eye: No discharge.     Conjunctiva/sclera: Conjunctivae normal.     Pupils: Pupils are equal, round, and reactive to light.  Neck:     Musculoskeletal: Normal range of motion and neck supple.     Thyroid: No thyromegaly.      Vascular: No JVD.     Trachea: No tracheal deviation.  Cardiovascular:     Rate and Rhythm: Normal rate and regular rhythm.     Heart sounds: Normal heart sounds.  Pulmonary:     Effort: No respiratory distress.     Breath sounds: No stridor. No wheezing.  Abdominal:     General: Bowel sounds are normal. There is no distension.     Palpations: Abdomen is soft. There is no mass.     Tenderness: There is no abdominal tenderness. There is no guarding or rebound.  Musculoskeletal:        General: No tenderness.  Lymphadenopathy:     Cervical: No cervical adenopathy.  Skin:    Findings: No erythema or rash.  Neurological:     Cranial Nerves: No cranial nerve deficit.     Motor: No abnormal muscle tone.     Coordination: Coordination normal.     Deep Tendon Reflexes: Reflexes normal.  Psychiatric:        Behavior: Behavior normal.        Thought Content: Thought content normal.        Judgment: Judgment normal.   obese  Lab Results  Component Value Date   WBC 7.4 04/11/2017   HGB 13.9 04/11/2017   HCT 41.6 04/11/2017   PLT 316 12/02/2016   GLUCOSE 87 08/08/2017   CHOL 164 08/08/2017   TRIG 106 08/08/2017   HDL 46 08/08/2017   LDLCALC 97 08/08/2017   ALT 21 08/08/2017   AST 16 08/08/2017   NA 139 08/08/2017   K 5.2 08/08/2017   CL 98 08/08/2017   CREATININE 0.72 08/08/2017   BUN 17 08/08/2017   CO2 26 08/08/2017   TSH 0.514 02/02/2018   INR 1.02 11/28/2014   HGBA1C 5.6 02/02/2018    Dg Finger Index Right  Result Date: 10/14/2017 CLINICAL DATA:  Slammed finger in car door today. Swelling, and pain since. EXAM: RIGHT INDEX FINGER 2+V COMPARISON:  None. FINDINGS: No fracture.  No bone lesion. Joints are normally spaced and aligned.  No arthropathic changes. There is soft tissue swelling. Soft tissue air is seen along the proximal to mid aspect of the finger, mostly along the palmar surface. No radiopaque foreign body. IMPRESSION: 1. No fracture or dislocation. 2. Soft  tissue swelling and soft tissue air, the latter consistent with a laceration. No radiopaque foreign body. Electronically Signed   By: Amie Portland M.D.   On: 10/14/2017 08:17    Assessment & Plan:   Diagnoses and all orders for this visit:  Other depression Comments: with emotional eating Orders: -     buPROPion (WELLBUTRIN SR) 200 MG 12  hr tablet; Take 1 tablet (200 mg total) by mouth daily.  Vitamin D deficiency -     calcitRIOL (ROCALTROL) 0.25 MCG capsule; Take 1 capsule (0.25 mcg total) by mouth 2 (two) times daily.  Prediabetes -     metFORMIN (GLUCOPHAGE) 500 MG tablet; Take 1 tablet (500 mg total) by mouth daily with breakfast.  Other orders -     ALPRAZolam (XANAX) 0.5 MG tablet; TAKE 1 OR 2 TABLETS BY MOUTH TWICE DAILY AS NEEDED FOR insomnia, anxiety -     Armodafinil 200 MG TABS; Take 1 tablet by mouth daily. -     Cyanocobalamin 1000 MCG SUBL; Take 1 tablet (1,000 mcg total) by mouth daily. -     gabapentin (NEURONTIN) 300 MG capsule; TAKE 1 TO 2 CAPSULES AT BEDTIME FOR NERVE PAIN -     levothyroxine (SYNTHROID, LEVOTHROID) 100 MCG tablet; Take 1 tablet (100 mcg total) by mouth daily before breakfast. -     omeprazole (PRILOSEC) 20 MG capsule; Take 1 capsule (20 mg total) by mouth daily. -     traMADol (ULTRAM) 50 MG tablet; Take 1 tablet (50 mg total) by mouth 2 (two) times daily. -     aspirin (GOODSENSE ASPIRIN) 325 MG tablet; Take 1 tablet (325 mg total) by mouth daily.     Meds ordered this encounter  Medications  . ALPRAZolam (XANAX) 0.5 MG tablet    Sig: TAKE 1 OR 2 TABLETS BY MOUTH TWICE DAILY AS NEEDED FOR insomnia, anxiety    Dispense:  90 tablet    Refill:  2  . Armodafinil 200 MG TABS    Sig: Take 1 tablet by mouth daily.    Dispense:  90 tablet    Refill:  1  . buPROPion (WELLBUTRIN SR) 200 MG 12 hr tablet    Sig: Take 1 tablet (200 mg total) by mouth daily.    Dispense:  90 tablet    Refill:  3  . calcitRIOL (ROCALTROL) 0.25 MCG capsule     Sig: Take 1 capsule (0.25 mcg total) by mouth 2 (two) times daily.    Dispense:  60 capsule    Refill:  0  . Cyanocobalamin 1000 MCG SUBL    Sig: Take 1 tablet (1,000 mcg total) by mouth daily.    Dispense:  90 each    Refill:  3  . gabapentin (NEURONTIN) 300 MG capsule    Sig: TAKE 1 TO 2 CAPSULES AT BEDTIME FOR NERVE PAIN    Dispense:  270 capsule    Refill:  3    This prescription was filled on 07/04/2016. Any refills authorized will be placed on file.  . levothyroxine (SYNTHROID, LEVOTHROID) 100 MCG tablet    Sig: Take 1 tablet (100 mcg total) by mouth daily before breakfast.    Dispense:  90 tablet    Refill:  3  . metFORMIN (GLUCOPHAGE) 500 MG tablet    Sig: Take 1 tablet (500 mg total) by mouth daily with breakfast.    Dispense:  90 tablet    Refill:  3  . omeprazole (PRILOSEC) 20 MG capsule    Sig: Take 1 capsule (20 mg total) by mouth daily.    Dispense:  90 capsule    Refill:  3  . traMADol (ULTRAM) 50 MG tablet    Sig: Take 1 tablet (50 mg total) by mouth 2 (two) times daily.    Dispense:  180 tablet    Refill:  1  . aspirin (GOODSENSE  ASPIRIN) 325 MG tablet    Sig: Take 1 tablet (325 mg total) by mouth daily.    Dispense:  100 tablet    Refill:  3     Follow-up: No follow-ups on file.  Sonda PrimesAlex Plotnikov, MD

## 2018-08-21 NOTE — Patient Instructions (Signed)

## 2018-08-21 NOTE — Assessment & Plan Note (Signed)
Vit D 

## 2018-08-22 LAB — TSH: TSH: 0.28 u[IU]/mL — ABNORMAL LOW (ref 0.35–4.50)

## 2018-09-01 ENCOUNTER — Other Ambulatory Visit: Payer: Self-pay | Admitting: Internal Medicine

## 2018-09-06 ENCOUNTER — Encounter: Payer: Self-pay | Admitting: Family

## 2018-09-06 ENCOUNTER — Ambulatory Visit: Payer: PRIVATE HEALTH INSURANCE | Admitting: Family

## 2018-09-06 VITALS — BP 150/90 | HR 89 | Temp 97.8°F | Ht 64.0 in | Wt 251.0 lb

## 2018-09-06 DIAGNOSIS — J209 Acute bronchitis, unspecified: Secondary | ICD-10-CM

## 2018-09-06 DIAGNOSIS — L608 Other nail disorders: Secondary | ICD-10-CM

## 2018-09-06 DIAGNOSIS — R030 Elevated blood-pressure reading, without diagnosis of hypertension: Secondary | ICD-10-CM

## 2018-09-06 MED ORDER — PROMETHAZINE-CODEINE 6.25-10 MG/5ML PO SYRP: 5.0000 mL | ORAL_SOLUTION | ORAL | 0 refills | Status: DC | PRN

## 2018-09-06 MED ORDER — FLUTICASONE PROPIONATE 50 MCG/ACT NA SUSP: 2.0000 | Freq: Every day | NASAL | 6 refills | Status: DC

## 2018-09-06 MED ORDER — DOXYCYCLINE HYCLATE 100 MG PO TABS: 100.0000 mg | ORAL_TABLET | Freq: Two times a day (BID) | ORAL | 0 refills | Status: DC

## 2018-09-06 MED ORDER — FLUCONAZOLE 150 MG PO TABS: 150.0000 mg | ORAL_TABLET | Freq: Once | ORAL | 0 refills | Status: AC

## 2018-09-06 NOTE — Progress Notes (Signed)
Tiffany Medina is a 53 y.o. female with the following history as recorded in [employer]:  Patient Active Problem List   Diagnosis Date Noted  . Right foot pain 06/23/2018  . Contusion of foot 06/22/2018  . Laceration of right index finger without foreign body without damage to nail 10/14/2017  . Mouth sores 10/14/2017  . Tobacco abuse counseling 09/05/2017  . Prediabetes 08/08/2017  . Other fatigue 04/11/2017  . Shortness of breath on exertion 04/11/2017  . Hyperglycemia 04/11/2017  . Class 3 severe obesity with serious comorbidity and body mass index (BMI) of 45.0 to 49.9 in adult ([place]) 04/11/2017  . Chest pain, atypical 03/11/2017  . Low back pain 03/11/2017  . Edema 12/03/2016  . Chronic venous insufficiency 12/03/2016  . Osteopenia 09/24/2016  . Artificial menopause state 08/25/2016  . Asymptomatic menopausal state 08/25/2016  . Stress at home 07/07/2016  . Insomnia 01/02/2016  . GERD (gastroesophageal reflux disease) 09/01/2015  . Acute bronchitis 06/27/2015  . Wheezing 06/27/2015  . Vitamin D deficiency 06/09/2015  . Family history of colon cancer   . Family history of breast cancer   . Cervical adenopathy 04/14/2015  . Tachycardia 04/14/2015  . Rash and nonspecific skin eruption 01/22/2015  . ADD (attention deficit disorder) 01/22/2015  . Restless leg syndrome 12/09/2014  . CFS (chronic fatigue syndrome) 12/09/2014  . Class 2 severe obesity with serious comorbidity and body mass index (BMI) of 38.0 to 38.9 in adult ([place]) 12/09/2014  . OSA (obstructive sleep apnea) 12/09/2014  . Hypothyroidism 12/09/2014  . Paresthesia 12/09/2014  . Heavy tobacco smoker >10 cigarettes per day 12/09/2014  . History of DVT of lower extremity 12/09/2014  . Discoid lupus 12/09/2014    Current Outpatient Medications  Medication Sig Dispense Refill  . Albuterol Sulfate (PROAIR RESPICLICK) 601 (90 Base) MCG/ACT AEPB Inhale 1-2 puffs into the lungs 4 (four) times daily as needed. 3 each 0   . ALPRAZolam (XANAX) 0.5 MG tablet TAKE 1 OR 2 TABLETS BY MOUTH TWICE DAILY AS NEEDED FOR insomnia, anxiety 90 tablet 2  . Armodafinil 200 MG TABS Take 1 tablet by mouth daily. 90 tablet 1  . aspirin (GOODSENSE ASPIRIN) 325 MG tablet Take 1 tablet (325 mg total) by mouth daily. 100 tablet 3  . Black Cohosh 540 MG CAPS 1 po qhs 90 capsule 3  . buPROPion (WELLBUTRIN SR) 200 MG 12 hr tablet Take 1 tablet (200 mg total) by mouth daily. 90 tablet 3  . calcitRIOL (ROCALTROL) 0.25 MCG capsule Take 1 capsule (0.25 mcg total) by mouth 2 (two) times daily. 60 capsule 0  . Cyanocobalamin 1000 MCG SUBL TAKE 1 TABLET BY MOUTH EVERY DAY 90 each 3  . diphenhydrAMINE (BENADRYL) 25 MG tablet Take 1 tablet (25 mg total) by mouth every 6 (six) hours as needed for allergies. 100 tablet 3  . gabapentin (NEURONTIN) 300 MG capsule TAKE 1 TO 2 CAPSULES AT BEDTIME FOR NERVE PAIN 270 capsule 3  . levothyroxine (SYNTHROID, LEVOTHROID) 100 MCG tablet Take 1 tablet (100 mcg total) by mouth daily before breakfast. 90 tablet 3  . metFORMIN (GLUCOPHAGE) 500 MG tablet Take 1 tablet (500 mg total) by mouth daily with breakfast. 90 tablet 3  . omeprazole (PRILOSEC) 20 MG capsule Take 1 capsule (20 mg total) by mouth daily. 90 capsule 3  . traMADol (ULTRAM) 50 MG tablet Take 1 tablet (50 mg total) by mouth 2 (two) times daily. 180 tablet 1  . doxycycline (VIBRA-TABS) 100 MG tablet Take 1 tablet (  100 mg total) by mouth 2 (two) times daily. 20 tablet 0  . fluconazole (DIFLUCAN) 150 MG tablet Take 1 tablet (150 mg total) by mouth once for 1 dose. Repeat after 72 hours 2 tablet 0  . fluticasone (FLONASE) 50 MCG/ACT nasal spray Place 2 sprays into both nostrils daily. 16 g 6  . promethazine-codeine (PHENERGAN WITH CODEINE) 6.25-10 MG/5ML syrup Take 5 mLs by mouth every 4 (four) hours as needed. 150 mL 0   No current facility-administered medications for this visit.     Allergies: Dihydrotachysterol and Vitamin d analogs  Past Medical  History:  Diagnosis Date  . Ankle pain   . Anxiety   . Asthma   . Back pain   . Chest pain   . Chronic fatigue syndrome   . Constipation   . COPD (chronic obstructive pulmonary disease) ([place])   . Depression   . Discoid lupus   . DVT (deep venous thrombosis) ([place])   . Dyspnea   . Family history of breast cancer   . Family history of colon cancer   . Genetic testing 06/20/2015   Negative genetic testing on the Breast/Ovarian cancer panel.  The Breast/Ovarian gene panel offered by [facility] includes sequencing and rearrangement analysis for the following 20 genes:  ATM, BARD1, BRCA1, BRCA2, BRIP1, CDH1, CHEK2, EPCAM, FANCC, MLH1, MSH2, MSH6, NBN, PALB2, PMS2, PTEN, RAD51C, RAD51D, TP53, and XRCC2.   The report date is June 19, 2015.   Marland Kitchen GERD (gastroesophageal reflux disease)   . Hot flashes   . Hypothyroidism   . Insomnia   . Leg edema   . Lupus ([place])   . Neuropathy   . Obesity   . Sciatica   . Sleep apnea     Past Surgical History:  Procedure Laterality Date  . ABDOMINAL HYSTERECTOMY    . APPENDECTOMY    . KNEE ARTHROSCOPY W/ MENISCAL REPAIR    . TUBAL LIGATION  1987  . VEIN LIGATION AND STRIPPING      Family History  Problem Relation Age of Onset  . Breast cancer Mother 64       bilateral breast cancer  . Hodgkin's lymphoma Father        dx in his 40s  . Lung cancer Father 32  . Lung cancer Sister 58       adenocarcinoma (also a smoker)  . Lupus Maternal Uncle   . Colon cancer Maternal Uncle        dx in his 47s  . Cancer Paternal Uncle        2 paternal uncles with cancer NOS  . Diabetes Maternal Grandmother   . Colon cancer Maternal Grandfather        late 42s  . Diabetes Paternal Grandmother   . Colon cancer Paternal Grandfather 80  . Breast cancer Sister 36  . Cancer Other   . Diabetes Other   . Mental illness Other     Social History   Tobacco Use  . Smoking status: Current Every Day Smoker    Packs/day: 1.00    Types: Cigarettes  . Smokeless  tobacco: Never Used  Substance Use Topics  . 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  Head: Normocephalic and atraumatic  Eyes: Sclera and conjunctiva clear; pupils round and reactive to light; extraocular movements intact  Ears: External normal; canals clear; tympanic membranes normal  Oropharynx: Pink, supple. No suspicious lesions  Neck: Supple without thyromegaly, adenopathy  Lungs: Respirations unlabored; wheezing noted in upper lobes CVS exam: normal rate and regular rhythm.  Neurologic: Alert and oriented; speech intact; face symmetrical; moves all extremities well; CNII-XII intact without focal deficit   Assessment:  1. Acute bronchitis, unspecified organism   2. Toenail deformity   3. Elevated blood pressure reading     Plan:  1. Rx for Doxycycline 100 mg bid x 7 days; use albuterol every 4-6 hours for the next few days; Rx for Phenergan with codeine per patient request- she notes her PCP has given in the past with good relief of symptoms; increase fluids, rest and follow-up worse, no better. 2. Refer to podiatry as discussed; 3. ? Due to illness today; needs to monitor- keep planned follow-up with her PCP for CPE in 2 months.    No follow-ups on file.  Orders Placed This Encounter  Procedures  . Ambulatory referral to Podiatry    Referral  Priority:   Urgent    Referral Type:   Consultation    Referral Reason:   Specialty Services Required    Requested Specialty:   Podiatry    Number of Visits Requested:   1    Requested Prescriptions   Signed Prescriptions Disp Refills  . doxycycline (VIBRA-TABS) 100 MG tablet 20 tablet 0    Sig: Take 1 tablet (100 mg total) by mouth 2 (two) times daily.  . fluticasone (FLONASE) 50 MCG/ACT nasal spray 16 g 6    Sig: Place 2 sprays into both nostrils daily.  . promethazine-codeine (PHENERGAN WITH CODEINE) 6.25-10 MG/5ML syrup 150 mL 0    Sig: Take 5 mLs by mouth every 4 (four) hours as needed.  . fluconazole (DIFLUCAN) 150 MG tablet 2 tablet 0    Sig: Take 1 tablet (150 mg total) by mouth once for 1 dose. Repeat after 72 hours

## 2018-09-07 DIAGNOSIS — M898X9 Other specified disorders of bone, unspecified site: Secondary | ICD-10-CM | POA: Insufficient documentation

## 2018-09-21 ENCOUNTER — Ambulatory Visit: Payer: BLUE CROSS/BLUE SHIELD | Admitting: Internal Medicine

## 2018-09-22 ENCOUNTER — Other Ambulatory Visit: Payer: Self-pay | Admitting: Internal Medicine

## 2018-09-22 DIAGNOSIS — E559 Vitamin D deficiency, unspecified: Secondary | ICD-10-CM

## 2018-10-04 ENCOUNTER — Telehealth: Payer: Self-pay

## 2018-10-04 NOTE — Telephone Encounter (Signed)
Please advise, do you want to do virtual?  Copied from CRM [id number]. 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  She stated that she needs a note for employer so she can get a break from work to let her body rest.  Please advise and call patient back at [phone]

## 2018-10-05 NOTE — Telephone Encounter (Signed)
Letter written and mailed. Pt notified

## 2018-10-05 NOTE — Telephone Encounter (Signed)
Ok w/me Thx 

## 2018-11-24 ENCOUNTER — Ambulatory Visit (INDEPENDENT_AMBULATORY_CARE_PROVIDER_SITE_OTHER): Payer: No Typology Code available for payment source | Admitting: Internal Medicine

## 2018-11-24 ENCOUNTER — Encounter: Payer: Self-pay | Admitting: Internal Medicine

## 2018-11-24 DIAGNOSIS — G8929 Other chronic pain: Secondary | ICD-10-CM

## 2018-11-24 DIAGNOSIS — F988 Other specified behavioral and emotional disorders with onset usually occurring in childhood and adolescence: Secondary | ICD-10-CM | POA: Diagnosis not present

## 2018-11-24 DIAGNOSIS — K2100 Gastro-esophageal reflux disease with esophagitis, without bleeding: Secondary | ICD-10-CM

## 2018-11-24 DIAGNOSIS — M5440 Lumbago with sciatica, unspecified side: Secondary | ICD-10-CM

## 2018-11-24 DIAGNOSIS — M79671 Pain in right foot: Secondary | ICD-10-CM

## 2018-11-24 DIAGNOSIS — R5382 Chronic fatigue, unspecified: Secondary | ICD-10-CM

## 2018-11-24 DIAGNOSIS — E559 Vitamin D deficiency, unspecified: Secondary | ICD-10-CM

## 2018-11-24 DIAGNOSIS — F419 Anxiety disorder, unspecified: Secondary | ICD-10-CM

## 2018-11-24 DIAGNOSIS — E038 Other specified hypothyroidism: Secondary | ICD-10-CM | POA: Diagnosis not present

## 2018-11-24 DIAGNOSIS — G9332 Myalgic encephalomyelitis/chronic fatigue syndrome: Secondary | ICD-10-CM

## 2018-11-24 MED ORDER — TRAMADOL HCL 50 MG PO TABS: 50.0000 mg | ORAL_TABLET | Freq: Two times a day (BID) | ORAL | 1 refills | Status: DC

## 2018-11-24 MED ORDER — ALPRAZOLAM 0.5 MG PO TABS: ORAL_TABLET | ORAL | 2 refills | Status: DC

## 2018-11-24 MED ORDER — CALCITRIOL 0.25 MCG PO CAPS: 0.2500 ug | ORAL_CAPSULE | Freq: Two times a day (BID) | ORAL | 2 refills | Status: DC

## 2018-11-24 NOTE — Assessment & Plan Note (Signed)
Armodafinil to continue

## 2018-11-24 NOTE — Assessment & Plan Note (Signed)
Xanax as needed  Potential benefits of a long term benzodiazepines  use as well as potential risks  and complications were explained to the patient and were aknowledged. 

## 2018-11-24 NOTE — Assessment & Plan Note (Signed)
-  Continue with Protonix 

## 2018-11-24 NOTE — Progress Notes (Addendum)
Virtual Visit via Video Note  I connected with Tiffany Medina on 11/24/18 at  9:10 AM EDT by a video enabled telemedicine application and verified that I am speaking with the correct person using two identifiers.   I discussed the limitations of evaluation and management by telemedicine and the availability of in person appointments. The patient expressed understanding and agreed to proceed.  History of Present Illness: We need to follow-up on  for depression/stress, shift work, anxiety   There has been no runny nose, cough, chest pain, shortness of breath, abdominal pain, diarrhea, constipation, arthralgias, skin rashes.   Observations/Objective: The patient appears to be in no acute distress, looks well.  Assessment and Plan:  See my Assessment and Plan. Follow Up Instructions:    I discussed the assessment and treatment plan with the patient. The patient was provided an opportunity to ask questions and all were answered. The patient agreed with the plan and demonstrated an understanding of the instructions.   The patient was advised to call back or seek an in-person evaluation if the symptoms worsen or if the condition fails to improve as anticipated.  I provided face-to-face time during this encounter. We were at different locations.   Sonda Primes, MD

## 2018-11-24 NOTE — Assessment & Plan Note (Signed)
Continue with Levothroid 

## 2018-11-24 NOTE — Assessment & Plan Note (Signed)
Tramadol prn ° Potential benefits of a long term opioids use as well as potential risks (i.e. addiction risk, apnea etc) and complications (i.e. Somnolence, constipation and others) were explained to the patient and were aknowledged. ° ° °

## 2018-11-24 NOTE — Assessment & Plan Note (Signed)
Toenail surgery for arthritic spur removal is planned for December 11, 2018

## 2018-11-24 NOTE — Assessment & Plan Note (Signed)
Off Adderall

## 2018-12-05 DIAGNOSIS — Z1159 Encounter for screening for other viral diseases: Secondary | ICD-10-CM | POA: Diagnosis not present

## 2018-12-11 DIAGNOSIS — M898X7 Other specified disorders of bone, ankle and foot: Secondary | ICD-10-CM | POA: Diagnosis not present

## 2018-12-11 DIAGNOSIS — D1631 Benign neoplasm of short bones of right lower limb: Secondary | ICD-10-CM | POA: Diagnosis not present

## 2019-02-27 DIAGNOSIS — U071 COVID-19: Secondary | ICD-10-CM | POA: Diagnosis not present

## 2019-02-27 DIAGNOSIS — Z20828 Contact with and (suspected) exposure to other viral communicable diseases: Secondary | ICD-10-CM | POA: Diagnosis not present

## 2019-03-01 ENCOUNTER — Ambulatory Visit: Payer: No Typology Code available for payment source | Admitting: Internal Medicine

## 2019-03-07 DIAGNOSIS — U071 COVID-19: Secondary | ICD-10-CM | POA: Diagnosis not present

## 2019-03-13 ENCOUNTER — Encounter: Payer: Self-pay | Admitting: Internal Medicine

## 2019-03-13 ENCOUNTER — Ambulatory Visit (INDEPENDENT_AMBULATORY_CARE_PROVIDER_SITE_OTHER): Payer: BLUE CROSS/BLUE SHIELD | Admitting: Internal Medicine

## 2019-03-13 ENCOUNTER — Other Ambulatory Visit: Payer: Self-pay

## 2019-03-13 VITALS — BP 138/86 | HR 97 | Temp 97.7°F | Ht 64.0 in | Wt 270.0 lb

## 2019-03-13 DIAGNOSIS — E559 Vitamin D deficiency, unspecified: Secondary | ICD-10-CM

## 2019-03-13 DIAGNOSIS — E038 Other specified hypothyroidism: Secondary | ICD-10-CM

## 2019-03-13 DIAGNOSIS — Z0000 Encounter for general adult medical examination without abnormal findings: Secondary | ICD-10-CM

## 2019-03-13 DIAGNOSIS — Z6838 Body mass index (BMI) 38.0-38.9, adult: Secondary | ICD-10-CM

## 2019-03-13 DIAGNOSIS — F419 Anxiety disorder, unspecified: Secondary | ICD-10-CM

## 2019-03-13 DIAGNOSIS — M79671 Pain in right foot: Secondary | ICD-10-CM

## 2019-03-13 MED ORDER — CALCITRIOL 0.25 MCG PO CAPS: 0.2500 ug | ORAL_CAPSULE | Freq: Two times a day (BID) | ORAL | 11 refills | Status: DC

## 2019-03-13 NOTE — Assessment & Plan Note (Signed)
On Rocaltrol 

## 2019-03-13 NOTE — Assessment & Plan Note (Signed)
Recovering well post-op

## 2019-03-13 NOTE — Assessment & Plan Note (Signed)
On Levothroid 

## 2019-03-13 NOTE — Assessment & Plan Note (Signed)
F/u w./Dr Beasley 

## 2019-03-13 NOTE — Assessment & Plan Note (Signed)
Xanax as needed  Potential benefits of a long term benzodiazepines  use as well as potential risks  and complications were explained to the patient and were aknowledged. 

## 2019-03-13 NOTE — Progress Notes (Signed)
Subjective:  Patient ID: Tiffany Medina, female    DOB: 02/12/1966  Age: 53 y.o. MRN: [id number]  CC: No chief complaint on file.   HPI Tiffany Medina presents for wt gain after foot surgery  F/u for depression/stress, shift work, anxiety f/u  Outpatient Medications Prior to Visit  Medication Sig Dispense Refill  . Albuterol Sulfate (PROAIR RESPICLICK) 108 (90 Base) MCG/ACT AEPB Inhale 1-2 puffs into the lungs 4 (four) times daily as needed. 3 each 0  . ALPRAZolam (XANAX) 0.5 MG tablet TAKE 1 OR 2 TABLETS BY MOUTH TWICE DAILY AS NEEDED FOR insomnia, anxiety 90 tablet 2  . Armodafinil 200 MG TABS Take 1 tablet by mouth daily. 90 tablet 1  . aspirin (GOODSENSE ASPIRIN) 325 MG tablet Take 1 tablet (325 mg total) by mouth daily. 100 tablet 3  . Black Cohosh 540 MG CAPS 1 po qhs 90 capsule 3  . buPROPion (WELLBUTRIN SR) 200 MG 12 hr tablet Take 1 tablet (200 mg total) by mouth daily. 90 tablet 3  . calcitRIOL (ROCALTROL) 0.25 MCG capsule Take 1 capsule (0.25 mcg total) by mouth 2 (two) times daily. 60 capsule 2  . Cyanocobalamin 1000 MCG SUBL TAKE 1 TABLET BY MOUTH EVERY DAY 90 each 3  . diphenhydrAMINE (BENADRYL) 25 MG tablet Take 1 tablet (25 mg total) by mouth every 6 (six) hours as needed for allergies. 100 tablet 3  . fluticasone (FLONASE) 50 MCG/ACT nasal spray Place 2 sprays into both nostrils daily. 16 g 6  . gabapentin (NEURONTIN) 300 MG capsule TAKE 1 TO 2 CAPSULES AT BEDTIME FOR NERVE PAIN 270 capsule 3  . levothyroxine (SYNTHROID, LEVOTHROID) 100 MCG tablet Take 1 tablet (100 mcg total) by mouth daily before breakfast. 90 tablet 3  . metFORMIN (GLUCOPHAGE) 500 MG tablet Take 1 tablet (500 mg total) by mouth daily with breakfast. 90 tablet 3  . omeprazole (PRILOSEC) 20 MG capsule Take 1 capsule (20 mg total) by mouth daily. 90 capsule 3  . promethazine-codeine (PHENERGAN WITH CODEINE) 6.25-10 MG/5ML syrup Take 5 mLs by mouth every 4 (four) hours as needed. 150 mL 0  . traMADol  (ULTRAM) 50 MG tablet Take 1 tablet (50 mg total) by mouth 2 (two) times daily. 180 tablet 1   No facility-administered medications prior to visit.     ROS: Review of Systems  Constitutional: Positive for unexpected weight change. Negative for activity change, appetite change, chills and fatigue.  HENT: Negative for congestion, mouth sores and sinus pressure.   Eyes: Negative for visual disturbance.  Respiratory: Negative for cough and chest tightness.   Gastrointestinal: Negative for abdominal pain and nausea.  Genitourinary: Negative for difficulty urinating, frequency and vaginal pain.  Musculoskeletal: Positive for arthralgias and back pain. Negative for gait problem.  Skin: Negative for pallor and rash.  Neurological: Negative for dizziness, tremors, weakness, numbness and headaches.  Psychiatric/Behavioral: Positive for decreased concentration and sleep disturbance. Negative for confusion and suicidal ideas. The patient is nervous/anxious.     Objective:  BP 138/86 (BP Location: Left Arm, Patient Position: Sitting, Cuff Size: Large)   Pulse 97   Temp 97.7 F (36.5 C) (Oral)   Ht 5\' 4"  (1.626 m)   Wt 270 lb (122.5 kg)   SpO2 98%   BMI 46.35 kg/m   BP Readings from Last 3 Encounters:  03/13/19 138/86  09/06/18 (!) 150/90  08/21/18 140/88    Wt Readings from Last 3 Encounters:  03/13/19 270 lb (122.5 kg)  09/06/18 251  lb 0.6 oz (113.9 kg)  08/21/18 246 lb (111.6 kg)    Physical Exam Constitutional:      General: She is not in acute distress.    Appearance: She is well-developed.  HENT:     Head: Normocephalic.     Right Ear: External ear normal.     Left Ear: External ear normal.     Nose: Nose normal.  Eyes:     General:        Right eye: No discharge.        Left eye: No discharge.     Conjunctiva/sclera: Conjunctivae normal.     Pupils: Pupils are equal, round, and reactive to light.  Neck:     Musculoskeletal: Normal range of motion and neck supple.      Thyroid: No thyromegaly.     Vascular: No JVD.     Trachea: No tracheal deviation.  Cardiovascular:     Rate and Rhythm: Normal rate and regular rhythm.     Heart sounds: Normal heart sounds.  Pulmonary:     Effort: No respiratory distress.     Breath sounds: No stridor. No wheezing.  Abdominal:     General: Bowel sounds are normal. There is no distension.     Palpations: Abdomen is soft. There is no mass.     Tenderness: There is no abdominal tenderness. There is no guarding or rebound.  Musculoskeletal:        General: Tenderness present.  Lymphadenopathy:     Cervical: No cervical adenopathy.  Skin:    Findings: No erythema or rash.  Neurological:     Cranial Nerves: No cranial nerve deficit.     Motor: No abnormal muscle tone.     Coordination: Coordination normal.     Deep Tendon Reflexes: Reflexes normal.  Psychiatric:        Behavior: Behavior normal.        Thought Content: Thought content normal.        Judgment: Judgment normal.   obese Trace edema  Lab Results  Component Value Date   WBC 7.4 04/11/2017   HGB 13.9 04/11/2017   HCT 41.6 04/11/2017   PLT 316 12/02/2016   GLUCOSE 84 08/21/2018   CHOL 164 08/08/2017   TRIG 106 08/08/2017   HDL 46 08/08/2017   LDLCALC 97 08/08/2017   ALT 22 08/21/2018   AST 20 08/21/2018   NA 140 08/21/2018   K 4.1 08/21/2018   CL 103 08/21/2018   CREATININE 0.81 08/21/2018   BUN 19 08/21/2018   CO2 28 08/21/2018   TSH 0.28 (L) 08/21/2018   INR 1.02 11/28/2014   HGBA1C 5.6 02/02/2018    Dg Finger Index Right  Result Date: 10/14/2017 CLINICAL DATA:  Slammed finger in car door today. Swelling, and pain since. EXAM: RIGHT INDEX FINGER 2+V COMPARISON:  None. FINDINGS: No fracture.  No bone lesion. Joints are normally spaced and aligned.  No arthropathic changes. There is soft tissue swelling. Soft tissue air is seen along the proximal to mid aspect of the finger, mostly along the palmar surface. No radiopaque foreign body.  IMPRESSION: 1. No fracture or dislocation. 2. Soft tissue swelling and soft tissue air, the latter consistent with a laceration. No radiopaque foreign body. Electronically Signed   By: Lajean Manes M.D.   On: 10/14/2017 08:17    Assessment & Plan:   There are no diagnoses linked to this encounter.   No orders of the defined types were placed in  this encounter.    Follow-up: No follow-ups on file.  Walker Kehr, MD

## 2019-03-13 NOTE — Patient Instructions (Signed)

## 2019-03-14 DIAGNOSIS — U071 COVID-19: Secondary | ICD-10-CM | POA: Diagnosis not present

## 2019-03-21 DIAGNOSIS — U071 COVID-19: Secondary | ICD-10-CM | POA: Diagnosis not present

## 2019-03-28 DIAGNOSIS — Z20828 Contact with and (suspected) exposure to other viral communicable diseases: Secondary | ICD-10-CM | POA: Diagnosis not present

## 2019-03-28 DIAGNOSIS — U071 COVID-19: Secondary | ICD-10-CM | POA: Diagnosis not present

## 2019-04-04 DIAGNOSIS — U071 COVID-19: Secondary | ICD-10-CM | POA: Diagnosis not present

## 2019-04-04 DIAGNOSIS — Z20828 Contact with and (suspected) exposure to other viral communicable diseases: Secondary | ICD-10-CM | POA: Diagnosis not present

## 2019-04-18 DIAGNOSIS — Z20828 Contact with and (suspected) exposure to other viral communicable diseases: Secondary | ICD-10-CM | POA: Diagnosis not present

## 2019-04-18 DIAGNOSIS — U071 COVID-19: Secondary | ICD-10-CM | POA: Diagnosis not present

## 2019-04-19 ENCOUNTER — Other Ambulatory Visit: Payer: Self-pay

## 2019-04-19 ENCOUNTER — Ambulatory Visit (INDEPENDENT_AMBULATORY_CARE_PROVIDER_SITE_OTHER): Payer: BLUE CROSS/BLUE SHIELD | Admitting: Internal Medicine

## 2019-04-19 ENCOUNTER — Encounter: Payer: Self-pay | Admitting: Internal Medicine

## 2019-04-19 DIAGNOSIS — R5382 Chronic fatigue, unspecified: Secondary | ICD-10-CM

## 2019-04-19 DIAGNOSIS — E559 Vitamin D deficiency, unspecified: Secondary | ICD-10-CM

## 2019-04-19 DIAGNOSIS — F3289 Other specified depressive episodes: Secondary | ICD-10-CM

## 2019-04-19 DIAGNOSIS — F419 Anxiety disorder, unspecified: Secondary | ICD-10-CM | POA: Diagnosis not present

## 2019-04-19 DIAGNOSIS — E038 Other specified hypothyroidism: Secondary | ICD-10-CM | POA: Diagnosis not present

## 2019-04-19 DIAGNOSIS — Z6838 Body mass index (BMI) 38.0-38.9, adult: Secondary | ICD-10-CM

## 2019-04-19 DIAGNOSIS — G9332 Myalgic encephalomyelitis/chronic fatigue syndrome: Secondary | ICD-10-CM

## 2019-04-19 DIAGNOSIS — R7303 Prediabetes: Secondary | ICD-10-CM

## 2019-04-19 DIAGNOSIS — Z716 Tobacco abuse counseling: Secondary | ICD-10-CM

## 2019-04-19 MED ORDER — BUPROPION HCL ER (SR) 200 MG PO TB12: 200.0000 mg | ORAL_TABLET | Freq: Every day | ORAL | 3 refills | Status: DC

## 2019-04-19 MED ORDER — LEVOTHYROXINE SODIUM 100 MCG PO TABS: 100.0000 ug | ORAL_TABLET | Freq: Every day | ORAL | 3 refills | Status: DC

## 2019-04-19 MED ORDER — ALPRAZOLAM 0.5 MG PO TABS: 1.5000 mg | ORAL_TABLET | Freq: Every evening | ORAL | 1 refills | Status: DC | PRN

## 2019-04-19 MED ORDER — METFORMIN HCL 500 MG PO TABS: 500.0000 mg | ORAL_TABLET | Freq: Every day | ORAL | 3 refills | Status: DC

## 2019-04-19 MED ORDER — TRAMADOL HCL 50 MG PO TABS: 50.0000 mg | ORAL_TABLET | Freq: Two times a day (BID) | ORAL | 1 refills | Status: DC

## 2019-04-19 MED ORDER — ARMODAFINIL 200 MG PO TABS: 1.0000 | ORAL_TABLET | Freq: Every day | ORAL | 1 refills | Status: DC

## 2019-04-19 MED ORDER — CALCITRIOL 0.25 MCG PO CAPS: 0.2500 ug | ORAL_CAPSULE | Freq: Two times a day (BID) | ORAL | 3 refills | Status: DC

## 2019-04-19 MED ORDER — OMEPRAZOLE 20 MG PO CPDR: 20.0000 mg | DELAYED_RELEASE_CAPSULE | Freq: Every day | ORAL | 3 refills | Status: DC

## 2019-04-19 MED ORDER — GABAPENTIN 300 MG PO CAPS: ORAL_CAPSULE | ORAL | 3 refills | Status: DC

## 2019-04-19 NOTE — Assessment & Plan Note (Signed)
Levothroid 

## 2019-04-19 NOTE — Assessment & Plan Note (Signed)
Smoking?

## 2019-04-19 NOTE — Assessment & Plan Note (Signed)
Wt Readings from Last 3 Encounters:  04/19/19 273 lb (123.8 kg)  03/13/19 270 lb (122.5 kg)  09/06/18 251 lb 0.6 oz (113.9 kg)  Diet discussed

## 2019-04-19 NOTE — Assessment & Plan Note (Signed)
Diet discussed 

## 2019-04-19 NOTE — Assessment & Plan Note (Signed)
On Rocaltrol 

## 2019-04-19 NOTE — Progress Notes (Signed)
Subjective:  Patient ID: Tiffany Medina, female    DOB: 06/27/1966  Age: 53 y.o. MRN: [id number]  CC: No chief complaint on file.   HPI Tiffany Medina presents for anxiety, insomnia/sleep disorder, COPD, lupus f/u  Outpatient Medications Prior to Visit  Medication Sig Dispense Refill  . Albuterol Sulfate (PROAIR RESPICLICK) 108 (90 Base) MCG/ACT AEPB Inhale 1-2 puffs into the lungs 4 (four) times daily as needed. 3 each 0  . ALPRAZolam (XANAX) 0.5 MG tablet TAKE 1 OR 2 TABLETS BY MOUTH TWICE DAILY AS NEEDED FOR insomnia, anxiety 90 tablet 2  . Armodafinil 200 MG TABS Take 1 tablet by mouth daily. 90 tablet 1  . aspirin (GOODSENSE ASPIRIN) 325 MG tablet Take 1 tablet (325 mg total) by mouth daily. 100 tablet 3  . Black Cohosh 540 MG CAPS 1 po qhs 90 capsule 3  . buPROPion (WELLBUTRIN SR) 200 MG 12 hr tablet Take 1 tablet (200 mg total) by mouth daily. 90 tablet 3  . calcitRIOL (ROCALTROL) 0.25 MCG capsule Take 1 capsule (0.25 mcg total) by mouth 2 (two) times daily. 60 capsule 11  . Cyanocobalamin 1000 MCG SUBL TAKE 1 TABLET BY MOUTH EVERY DAY 90 each 3  . diphenhydrAMINE (BENADRYL) 25 MG tablet Take 1 tablet (25 mg total) by mouth every 6 (six) hours as needed for allergies. 100 tablet 3  . fluticasone (FLONASE) 50 MCG/ACT nasal spray Place 2 sprays into both nostrils daily. 16 g 6  . gabapentin (NEURONTIN) 300 MG capsule TAKE 1 TO 2 CAPSULES AT BEDTIME FOR NERVE PAIN 270 capsule 3  . levothyroxine (SYNTHROID, LEVOTHROID) 100 MCG tablet Take 1 tablet (100 mcg total) by mouth daily before breakfast. 90 tablet 3  . metFORMIN (GLUCOPHAGE) 500 MG tablet Take 1 tablet (500 mg total) by mouth daily with breakfast. 90 tablet 3  . omeprazole (PRILOSEC) 20 MG capsule Take 1 capsule (20 mg total) by mouth daily. 90 capsule 3  . promethazine-codeine (PHENERGAN WITH CODEINE) 6.25-10 MG/5ML syrup Take 5 mLs by mouth every 4 (four) hours as needed. 150 mL 0  . traMADol (ULTRAM) 50 MG tablet Take 1  tablet (50 mg total) by mouth 2 (two) times daily. 180 tablet 1   No facility-administered medications prior to visit.     ROS: Review of Systems  Constitutional: Positive for fatigue and unexpected weight change. Negative for activity change, appetite change and chills.  HENT: Negative for congestion, mouth sores and sinus pressure.   Eyes: Negative for visual disturbance.  Respiratory: Positive for shortness of breath. Negative for cough and chest tightness.   Cardiovascular: Positive for palpitations and leg swelling. Negative for chest pain.  Gastrointestinal: Negative for abdominal pain and nausea.  Genitourinary: Negative for difficulty urinating, frequency and vaginal pain.  Musculoskeletal: Positive for arthralgias, back pain and gait problem.  Skin: Negative for pallor and rash.  Neurological: Negative for dizziness, tremors, weakness, numbness and headaches.  Psychiatric/Behavioral: Positive for decreased concentration. Negative for confusion, sleep disturbance and suicidal ideas.    Objective:  BP 130/86 (BP Location: Left Arm, Patient Position: Sitting, Cuff Size: Large)   Pulse (!) 112   Temp 98 F (36.7 C) (Oral)   Ht 5\' 4"  (1.626 m)   Wt 273 lb (123.8 kg)   SpO2 97%   BMI 46.86 kg/m   BP Readings from Last 3 Encounters:  04/19/19 130/86  03/13/19 138/86  09/06/18 (!) 150/90    Wt Readings from Last 3 Encounters:  04/19/19 273 lb (123.8  kg)  03/13/19 270 lb (122.5 kg)  09/06/18 251 lb 0.6 oz (113.9 kg)    Physical Exam Constitutional:      General: She is not in acute distress.    Appearance: She is well-developed. She is obese.  HENT:     Head: Normocephalic.     Right Ear: External ear normal.     Left Ear: External ear normal.     Nose: Nose normal.  Eyes:     General:        Right eye: No discharge.        Left eye: No discharge.     Conjunctiva/sclera: Conjunctivae normal.     Pupils: Pupils are equal, round, and reactive to light.  Neck:      Musculoskeletal: Normal range of motion and neck supple.     Thyroid: No thyromegaly.     Vascular: No JVD.     Trachea: No tracheal deviation.  Cardiovascular:     Rate and Rhythm: Normal rate and regular rhythm.     Heart sounds: Normal heart sounds.  Pulmonary:     Effort: No respiratory distress.     Breath sounds: No stridor. Rhonchi present. No wheezing.  Abdominal:     General: Bowel sounds are normal. There is no distension.     Palpations: Abdomen is soft. There is no mass.     Tenderness: There is no abdominal tenderness. There is no guarding or rebound.  Musculoskeletal:        General: Tenderness present.  Lymphadenopathy:     Cervical: No cervical adenopathy.  Skin:    Findings: No erythema or rash.  Neurological:     Cranial Nerves: No cranial nerve deficit.     Motor: No abnormal muscle tone.     Coordination: Coordination abnormal.     Deep Tendon Reflexes: Reflexes normal.  Psychiatric:        Behavior: Behavior normal.        Thought Content: Thought content normal.        Judgment: Judgment normal.     Lab Results  Component Value Date   WBC 7.4 04/11/2017   HGB 13.9 04/11/2017   HCT 41.6 04/11/2017   PLT 316 12/02/2016   GLUCOSE 84 08/21/2018   CHOL 164 08/08/2017   TRIG 106 08/08/2017   HDL 46 08/08/2017   LDLCALC 97 08/08/2017   ALT 22 08/21/2018   AST 20 08/21/2018   NA 140 08/21/2018   K 4.1 08/21/2018   CL 103 08/21/2018   CREATININE 0.81 08/21/2018   BUN 19 08/21/2018   CO2 28 08/21/2018   TSH 0.28 (L) 08/21/2018   INR 1.02 11/28/2014   HGBA1C 5.6 02/02/2018    Dg Finger Index Right  Result Date: 10/14/2017 CLINICAL DATA:  Slammed finger in car door today. Swelling, and pain since. EXAM: RIGHT INDEX FINGER 2+V COMPARISON:  None. FINDINGS: No fracture.  No bone lesion. Joints are normally spaced and aligned.  No arthropathic changes. There is soft tissue swelling. Soft tissue air is seen along the proximal to mid aspect of the  finger, mostly along the palmar surface. No radiopaque foreign body. IMPRESSION: 1. No fracture or dislocation. 2. Soft tissue swelling and soft tissue air, the latter consistent with a laceration. No radiopaque foreign body. Electronically Signed   By: Lajean Manes M.D.   On: 10/14/2017 08:17    Assessment & Plan:   There are no diagnoses linked to this encounter.   No orders  of the defined types were placed in this encounter.    Follow-up: No follow-ups on file.  Walker Kehr, MD

## 2019-04-19 NOTE — Assessment & Plan Note (Signed)
Loose wt Diet discussed

## 2019-04-19 NOTE — Assessment & Plan Note (Signed)
Xanax as needed  Potential benefits of a long term benzodiazepines  use as well as potential risks  and complications were explained to the patient and were aknowledged. 

## 2019-04-25 DIAGNOSIS — Z20828 Contact with and (suspected) exposure to other viral communicable diseases: Secondary | ICD-10-CM | POA: Diagnosis not present

## 2019-04-25 DIAGNOSIS — J45909 Unspecified asthma, uncomplicated: Secondary | ICD-10-CM | POA: Insufficient documentation

## 2019-04-25 DIAGNOSIS — I82409 Acute embolism and thrombosis of unspecified deep veins of unspecified lower extremity: Secondary | ICD-10-CM | POA: Insufficient documentation

## 2019-04-25 DIAGNOSIS — R519 Headache, unspecified: Secondary | ICD-10-CM | POA: Insufficient documentation

## 2019-04-25 DIAGNOSIS — U071 COVID-19: Secondary | ICD-10-CM | POA: Diagnosis not present

## 2019-04-25 DIAGNOSIS — K5730 Diverticulosis of large intestine without perforation or abscess without bleeding: Secondary | ICD-10-CM | POA: Insufficient documentation

## 2019-04-26 DIAGNOSIS — Z6841 Body Mass Index (BMI) 40.0 and over, adult: Secondary | ICD-10-CM | POA: Diagnosis not present

## 2019-04-26 DIAGNOSIS — Z01419 Encounter for gynecological examination (general) (routine) without abnormal findings: Secondary | ICD-10-CM | POA: Diagnosis not present

## 2019-04-26 DIAGNOSIS — Z1231 Encounter for screening mammogram for malignant neoplasm of breast: Secondary | ICD-10-CM | POA: Diagnosis not present

## 2019-04-26 DIAGNOSIS — N760 Acute vaginitis: Secondary | ICD-10-CM | POA: Diagnosis not present

## 2019-05-02 DIAGNOSIS — Z20828 Contact with and (suspected) exposure to other viral communicable diseases: Secondary | ICD-10-CM | POA: Diagnosis not present

## 2019-05-02 DIAGNOSIS — U071 COVID-19: Secondary | ICD-10-CM | POA: Diagnosis not present

## 2019-06-12 ENCOUNTER — Ambulatory Visit: Payer: No Typology Code available for payment source | Admitting: Internal Medicine

## 2019-08-23 ENCOUNTER — Other Ambulatory Visit: Payer: Self-pay

## 2019-08-23 ENCOUNTER — Ambulatory Visit: Payer: BLUE CROSS/BLUE SHIELD | Admitting: Internal Medicine

## 2019-08-24 ENCOUNTER — Encounter: Payer: Self-pay | Admitting: Internal Medicine

## 2019-08-24 ENCOUNTER — Other Ambulatory Visit: Payer: Self-pay

## 2019-08-24 ENCOUNTER — Ambulatory Visit (INDEPENDENT_AMBULATORY_CARE_PROVIDER_SITE_OTHER): Payer: Managed Care, Other (non HMO) | Admitting: Internal Medicine

## 2019-08-24 ENCOUNTER — Ambulatory Visit: Payer: Managed Care, Other (non HMO) | Admitting: Internal Medicine

## 2019-08-24 DIAGNOSIS — M5440 Lumbago with sciatica, unspecified side: Secondary | ICD-10-CM

## 2019-08-24 DIAGNOSIS — R21 Rash and other nonspecific skin eruption: Secondary | ICD-10-CM | POA: Diagnosis not present

## 2019-08-24 DIAGNOSIS — G8929 Other chronic pain: Secondary | ICD-10-CM

## 2019-08-24 DIAGNOSIS — F419 Anxiety disorder, unspecified: Secondary | ICD-10-CM | POA: Diagnosis not present

## 2019-08-24 MED ORDER — TRIAMCINOLONE ACETONIDE 0.1 % EX CREA: 1.0000 "application " | TOPICAL_CREAM | Freq: Four times a day (QID) | CUTANEOUS | 1 refills | Status: DC

## 2019-08-24 MED ORDER — METHYLPREDNISOLONE 4 MG PO TBPK: ORAL_TABLET | ORAL | 0 refills | Status: DC

## 2019-08-24 NOTE — Assessment & Plan Note (Signed)
Tramadol as needed  Potential benefits of a long term opioids use as well as potential risks (i.e. addiction risk, apnea etc) and complications (i.e. Somnolence, constipation and others) were explained to the patient and were aknowledged.

## 2019-08-24 NOTE — Assessment & Plan Note (Signed)
Post Covid 19 vaccination rash on the chest and neck, extensive.  Prescribed triamcinolone cream to use 4 times a day and a Medrol Dosepak

## 2019-08-24 NOTE — Progress Notes (Signed)
Virtual Visit via Video Note  I connected with [name] on 08/24/19 at  1:40 PM EST by a video enabled telemedicine application and verified that I am speaking with the correct person using two identifiers.   I discussed the limitations of evaluation and management by telemedicine and the availability of in person appointments. The patient expressed understanding and agreed to proceed.  History of Present Illness: We need to follow-up on her COVID-19 vaccine side effects.  She was vaccinated on Sunday with Moderna vaccine.  She has developed multiple side effects including arthralgias, fever, fatigue, chills and rash in covering her chest and neck.  Is been itchy.  The rash is new.  It appeared on Wednesday   There has been no runny nose, abdominal pain, diarrhea, constipation.   Observations/Objective: The patient appears to be in no acute distress, looks well.  Assessment and Plan:  See my Assessment and Plan. Follow Up Instructions:    I discussed the assessment and treatment plan with the patient. The patient was provided an opportunity to ask questions and all were answered. The patient agreed with the plan and demonstrated an understanding of the instructions.   The patient was advised to call back or seek an in-person evaluation if the symptoms worsen or if the condition fails to improve as anticipated.  I provided face-to-face time during this encounter. We were at different locations.   Sonda Primes, MD

## 2019-08-24 NOTE — Assessment & Plan Note (Signed)
Xanax prn Do not take with tramadol  Potential benefits of a long term benzodiazepines  use as well as potential risks  and complications were explained to the patient and were aknowledged.

## 2019-09-24 ENCOUNTER — Telehealth: Payer: Self-pay | Admitting: Internal Medicine

## 2019-09-24 NOTE — Telephone Encounter (Addendum)
   Patient calling to report she started experiencing diarrhea and vomiting, headache No fever. Patient requesting advice and work  Note. Advised patient to schedule virtual visit (3/23 Burns) or visit urgent care  No callback needed

## 2019-09-24 NOTE — Telephone Encounter (Signed)
Has appt with Lawerance Bach tomorrow

## 2019-09-24 NOTE — Progress Notes (Signed)
Virtual Visit via Video Note  I connected with Tiffany Medina on 09/25/19 at  1:30 PM EDT by a video enabled telemedicine application and verified that I am speaking with the correct person using two identifiers.   I discussed the limitations of evaluation and management by telemedicine and the availability of in person appointments. The patient expressed understanding and agreed to proceed.  Present for the visit:  Myself, Dr Billey Gosling, Tiffany Medina.  The patient is currently at home and I am in the office.    No referring provider.    History of Present Illness: This is an acute visit for vomiting, diarrhea, headache  She woke up yesterday morning and did not feel good.  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  On the way had N/V and Diarrhea.  She developed a headache.  It continued all day.  She is very tired.  She tried imodium, which helped a little.  She has some mild achiness.  She is not having any other concerning symptoms.  She denies fever.     She denies travel, unusual food or recent antibiotics  She took imodium, which helps lower the diarrhea.  She is trying to keep hydrated.   She is tested weekly for covid - last Monday negative.  She was not tested this week.  As far she knows there is nothing going around at work.  Review of Systems  Constitutional: Positive for malaise/fatigue. Negative for chills and fever.       Decreased appetite  HENT: Negative for congestion, sinus pain and sore throat.   Respiratory: Negative for cough, shortness of breath and wheezing.   Cardiovascular: Negative for chest pain and palpitations.  Gastrointestinal: Positive for abdominal pain, diarrhea, nausea and vomiting. Negative for blood in stool and melena.  Genitourinary: Negative for dysuria and hematuria.  Musculoskeletal: Positive for myalgias (mild).  Neurological: Positive for headaches. Negative for dizziness.        Social History   Socioeconomic  History  . Marital status: Married    Spouse name: Toby  . Number of children: 1  . Years of education: College  . Highest education level: Not on file  Occupational History  . 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 Smoking status: Current Every Day Smoker    Packs/day: 1.00    Types: Cigarettes  . Smokeless tobacco: Never Used  Substance and Sexual Activity  . Alcohol use: No    Alcohol/week: 0.0 standard drinks  . Drug use: No  . Sexual activity: Yes  Other Topics Concern  . Not on file  Social History Narrative   Drinks 2 cups of coffee a day and 2 Dt Mt Dews   Social Determinants of Health   Financial Resource Strain:   . Difficulty of Paying Living Expenses:   Food Insecurity:   . Worried About Charity fundraiser in the Last Year:   . Arboriculturist in the Last Year:   Transportation Needs:   . Film/video editor (Medical):   Marland Kitchen Lack of Transportation (Non-Medical):   Physical Activity:   . Days of Exercise per Week:   . Minutes of Exercise per Session:   Stress:   . Feeling of Stress :   Social Connections:   . Frequency of Communication with Friends and Family:   . Frequency of Social Gatherings with Friends and Family:   . Attends Religious Services:   .  Active Member of Clubs or Organizations:   . Attends Banker Meetings:   Marland Kitchen Marital Status:      Observations/Objective: Appears well in NAD Breathing normally and speaking in full sentences Nondiaphoretic  Assessment and Plan:  Viral gastroenteritis: Symptoms consistent with viral gastroenteritis She is low risk for bacterial gastroenteritis so we will hold off on further testing unless her symptoms do not improve or worsen Phenergan as needed for nausea/vomiting Advised her to take Imodium as needed for diarrhea Stressed keeping hydrated Brat diet-food as tolerated Note given for work Advised her to call if her symptoms are not improving or  worsening   Follow Up Instructions:    I discussed the assessment and treatment plan with the patient. The patient was provided an opportunity to ask questions and all were answered. The patient agreed with the plan and demonstrated an understanding of the instructions.   The patient was advised to call back or seek an in-person evaluation if the symptoms worsen or if the condition fails to improve as anticipated.    Pincus Sanes, MD

## 2019-09-25 ENCOUNTER — Encounter: Payer: Self-pay | Admitting: Internal Medicine

## 2019-09-25 ENCOUNTER — Ambulatory Visit (INDEPENDENT_AMBULATORY_CARE_PROVIDER_SITE_OTHER): Payer: Managed Care, Other (non HMO) | Admitting: Internal Medicine

## 2019-09-25 DIAGNOSIS — A084 Viral intestinal infection, unspecified: Secondary | ICD-10-CM | POA: Diagnosis not present

## 2019-09-25 MED ORDER — PROMETHAZINE HCL 25 MG PO TABS: 25.0000 mg | ORAL_TABLET | Freq: Three times a day (TID) | ORAL | 0 refills | Status: DC | PRN

## 2019-10-11 ENCOUNTER — Telehealth: Payer: Self-pay | Admitting: Internal Medicine

## 2019-10-11 NOTE — Telephone Encounter (Signed)
   Pt c/o medication issue:  1. Name of Medication: Armodafinil 200 MG TABS  2. How are you currently taking this medication (dosage and times per day)? As written  3. Are you having a reaction (difficulty breathing--STAT)? no  4. What is your medication issue? Patient states Jordan Hawks is requesting prior auth for Armodafinil 200 MG TABS

## 2019-10-17 ENCOUNTER — Other Ambulatory Visit: Payer: Self-pay | Admitting: Internal Medicine

## 2019-10-19 NOTE — Telephone Encounter (Signed)
Key: [id number]

## 2019-10-19 NOTE — Telephone Encounter (Signed)
The PA was denied. I have submitted an appeal for the requested medication.

## 2019-10-24 ENCOUNTER — Telehealth: Payer: Self-pay | Admitting: Internal Medicine

## 2019-10-24 NOTE — Telephone Encounter (Signed)
Information given to appeals team

## 2019-10-24 NOTE — Telephone Encounter (Signed)
New message:   Case # [id number]  Tiffany Medina is calling from [facility] and states they need a diagnosis code as of to why the pt is currently taking Armodafinil 200 MG TABS to go along with the appeal. Please advise.

## 2019-10-26 ENCOUNTER — Other Ambulatory Visit: Payer: Self-pay | Admitting: Internal Medicine

## 2019-11-21 NOTE — Telephone Encounter (Signed)
Appeal approved through 10/24/20

## 2020-01-02 ENCOUNTER — Other Ambulatory Visit: Payer: Self-pay | Admitting: Internal Medicine

## 2020-01-02 NOTE — Telephone Encounter (Signed)
[place] Controlled Database Checked Last filled: alprazolam 11/30/2019 (90) Tramadol 11/30/2019 (60) LOV w/you: 08/24/2019 Next appt w/you: none

## 2020-02-13 ENCOUNTER — Telehealth (INDEPENDENT_AMBULATORY_CARE_PROVIDER_SITE_OTHER): Payer: Managed Care, Other (non HMO) | Admitting: Internal Medicine

## 2020-02-13 ENCOUNTER — Encounter: Payer: Self-pay | Admitting: Internal Medicine

## 2020-02-13 DIAGNOSIS — R5382 Chronic fatigue, unspecified: Secondary | ICD-10-CM

## 2020-02-13 DIAGNOSIS — E038 Other specified hypothyroidism: Secondary | ICD-10-CM

## 2020-02-13 DIAGNOSIS — R739 Hyperglycemia, unspecified: Secondary | ICD-10-CM | POA: Diagnosis not present

## 2020-02-13 NOTE — Assessment & Plan Note (Signed)
Labs

## 2020-02-13 NOTE — Assessment & Plan Note (Signed)
Severe Labs Off work x 1 week Rest/sleep

## 2020-02-13 NOTE — Assessment & Plan Note (Signed)
A1c

## 2020-02-13 NOTE — Progress Notes (Signed)
Virtual Visit via Video Note  I connected with Tiffany Medina on 02/13/20 at  3:40 PM EDT by a video enabled telemedicine application and verified that I am speaking with the correct person using two identifiers.   I discussed the limitations of evaluation and management by telemedicine and the availability of in person appointments. The patient expressed understanding and agreed to proceed.  I was located at our [facility] office. The patient was at home. There was no one else present in the visit.   History of Present Illness: C/o being very very tired x several days  There has been no runny nose, cough, chest pain, shortness of breath, abdominal pain, diarrhea, constipation,  skin rashes.   Observations/Objective: The patient appears to be in no acute distress, looks well.  Assessment and Plan:  See my Assessment and Plan. Follow Up Instructions:    I discussed the assessment and treatment plan with the patient. The patient was provided an opportunity to ask questions and all were answered. The patient agreed with the plan and demonstrated an understanding of the instructions.   The patient was advised to call back or seek an in-person evaluation if the symptoms worsen or if the condition fails to improve as anticipated.  I provided face-to-face time during this encounter. We were at different locations.   Sonda Primes, MD

## 2020-02-15 ENCOUNTER — Other Ambulatory Visit (INDEPENDENT_AMBULATORY_CARE_PROVIDER_SITE_OTHER): Payer: Managed Care, Other (non HMO)

## 2020-02-15 DIAGNOSIS — R739 Hyperglycemia, unspecified: Secondary | ICD-10-CM

## 2020-02-15 DIAGNOSIS — R5382 Chronic fatigue, unspecified: Secondary | ICD-10-CM | POA: Diagnosis not present

## 2020-02-15 DIAGNOSIS — E038 Other specified hypothyroidism: Secondary | ICD-10-CM

## 2020-02-15 LAB — VITAMIN D 25 HYDROXY (VIT D DEFICIENCY, FRACTURES): VITD: 27.83 ng/mL — ABNORMAL LOW (ref 30.00–100.00)

## 2020-02-15 LAB — CBC WITH DIFFERENTIAL/PLATELET
Basophils Absolute: 0 10*3/uL (ref 0.0–0.1)
Basophils Relative: 0.5 % (ref 0.0–3.0)
Eosinophils Absolute: 0.1 10*3/uL (ref 0.0–0.7)
Eosinophils Relative: 1.4 % (ref 0.0–5.0)
HCT: 45.7 % (ref 36.0–46.0)
Hemoglobin: 15.4 g/dL — ABNORMAL HIGH (ref 12.0–15.0)
Lymphocytes Relative: 25.2 % (ref 12.0–46.0)
Lymphs Abs: 1.5 10*3/uL (ref 0.7–4.0)
MCHC: 33.8 g/dL (ref 30.0–36.0)
MCV: 93.4 fl (ref 78.0–100.0)
Monocytes Absolute: 0.5 10*3/uL (ref 0.1–1.0)
Monocytes Relative: 8.6 % (ref 3.0–12.0)
Neutro Abs: 3.9 10*3/uL (ref 1.4–7.7)
Neutrophils Relative %: 64.3 % (ref 43.0–77.0)
Platelets: 369 10*3/uL (ref 150.0–400.0)
RBC: 4.9 Mil/uL (ref 3.87–5.11)
RDW: 13.6 % (ref 11.5–15.5)
WBC: 6.1 10*3/uL (ref 4.0–10.5)

## 2020-02-15 LAB — URINALYSIS
Bilirubin Urine: NEGATIVE
Hgb urine dipstick: NEGATIVE
Leukocytes,Ua: NEGATIVE
Nitrite: NEGATIVE
Specific Gravity, Urine: 1.02 (ref 1.000–1.030)
Urine Glucose: NEGATIVE
Urobilinogen, UA: 0.2 (ref 0.0–1.0)
pH: 8 (ref 5.0–8.0)

## 2020-02-15 LAB — COMPREHENSIVE METABOLIC PANEL WITH GFR
ALT: 19 U/L (ref 0–35)
AST: 17 U/L (ref 0–37)
Albumin: 4.4 g/dL (ref 3.5–5.2)
Alkaline Phosphatase: 96 U/L (ref 39–117)
BUN: 13 mg/dL (ref 6–23)
CO2: 32 meq/L (ref 19–32)
Calcium: 10 mg/dL (ref 8.4–10.5)
Chloride: 100 meq/L (ref 96–112)
Creatinine, Ser: 0.83 mg/dL (ref 0.40–1.20)
GFR: 71.29 mL/min
Glucose, Bld: 94 mg/dL (ref 70–99)
Potassium: 4.6 meq/L (ref 3.5–5.1)
Sodium: 139 meq/L (ref 135–145)
Total Bilirubin: 0.3 mg/dL (ref 0.2–1.2)
Total Protein: 7.5 g/dL (ref 6.0–8.3)

## 2020-02-15 LAB — HEMOGLOBIN A1C: Hgb A1c MFr Bld: 5.9 % (ref 4.6–6.5)

## 2020-02-15 LAB — VITAMIN B12: Vitamin B-12: 1107 pg/mL — ABNORMAL HIGH (ref 211–911)

## 2020-02-15 LAB — T4, FREE: Free T4: 1.36 ng/dL (ref 0.60–1.60)

## 2020-02-15 LAB — TSH: TSH: 1.37 u[IU]/mL (ref 0.35–4.50)

## 2020-02-17 ENCOUNTER — Other Ambulatory Visit: Payer: Self-pay | Admitting: Internal Medicine

## 2020-02-17 MED ORDER — CALCITRIOL 0.5 MCG PO CAPS
0.5000 ug | ORAL_CAPSULE | Freq: Two times a day (BID) | ORAL | 11 refills | Status: DC
Start: 2020-02-17 — End: 2020-06-09

## 2020-03-12 ENCOUNTER — Other Ambulatory Visit: Payer: Self-pay | Admitting: Internal Medicine

## 2020-05-14 ENCOUNTER — Other Ambulatory Visit: Payer: Self-pay | Admitting: Internal Medicine

## 2020-05-14 DIAGNOSIS — F3289 Other specified depressive episodes: Secondary | ICD-10-CM

## 2020-05-14 DIAGNOSIS — R7303 Prediabetes: Secondary | ICD-10-CM

## 2020-05-14 DIAGNOSIS — E559 Vitamin D deficiency, unspecified: Secondary | ICD-10-CM

## 2020-05-15 ENCOUNTER — Telehealth: Payer: Self-pay | Admitting: Internal Medicine

## 2020-05-15 NOTE — Telephone Encounter (Signed)
Duplicate request.. receive escript from pharmacy already and they have been done. Pt is need OV for future refills.Marland KitchenRaechel Medina

## 2020-05-15 NOTE — Telephone Encounter (Signed)
traMADol (ULTRAM) 50 MG tablet gabapentin (NEURONTIN) 300 MG capsule ALPRAZolam (XANAX) 0.5 MG tablet Armodafinil 200 MG TABS calcitRIOL (ROCALTROL) 0.5 MCG capsule metFORMIN (GLUCOPHAGE) 500 MG tablet buPROPion (WELLBUTRIN SR) 200 MG 12 hr tablet omeprazole (PRILOSEC) 20 MG capsule EUTHYROX 100 MCG tablet [facility] [place], [place] - [address] Phone:  [phone]  Fax:  [phone]     Patient requesting a refill of all of her medications

## 2020-06-02 ENCOUNTER — Other Ambulatory Visit: Payer: Self-pay | Admitting: Family

## 2020-06-02 ENCOUNTER — Other Ambulatory Visit: Payer: Self-pay

## 2020-06-02 ENCOUNTER — Encounter: Payer: Self-pay | Admitting: Family

## 2020-06-02 ENCOUNTER — Ambulatory Visit: Payer: Managed Care, Other (non HMO) | Admitting: Family

## 2020-06-02 VITALS — BP 136/72 | HR 94 | Temp 98.3°F | Ht 64.0 in | Wt 254.0 lb

## 2020-06-02 DIAGNOSIS — B0089 Other herpesviral infection: Secondary | ICD-10-CM | POA: Diagnosis not present

## 2020-06-02 MED ORDER — VALACYCLOVIR HCL 500 MG PO TABS: 500.0000 mg | ORAL_TABLET | Freq: Two times a day (BID) | ORAL | 0 refills | Status: DC

## 2020-06-02 MED ORDER — PROMETHAZINE HCL 25 MG PO TABS: 25.0000 mg | ORAL_TABLET | Freq: Three times a day (TID) | ORAL | 0 refills | Status: DC | PRN

## 2020-06-02 MED ORDER — TRIAMCINOLONE ACETONIDE 0.1 % EX CREA: 1.0000 "application " | TOPICAL_CREAM | Freq: Two times a day (BID) | CUTANEOUS | 0 refills | Status: DC

## 2020-06-02 NOTE — Progress Notes (Signed)
Tiffany Medina is a 54 y.o. female with the following history as recorded in EpicCare:  Patient Active Problem List   Diagnosis Date Noted  . Viral gastroenteritis 09/25/2019  . Anxiety disorder 11/24/2018  . Right foot pain 06/23/2018  . Contusion of foot 06/22/2018  . Laceration of right index finger without foreign body without damage to nail 10/14/2017  . Mouth sores 10/14/2017  . Tobacco abuse counseling 09/05/2017  . Prediabetes 08/08/2017  . Fatigue 04/11/2017  . Shortness of breath on exertion 04/11/2017  . Hyperglycemia 04/11/2017  . Class 3 severe obesity with serious comorbidity and body mass index (BMI) of 45.0 to 49.9 in adult ([place]) 04/11/2017  . Chest pain, atypical 03/11/2017  . Low back pain 03/11/2017  . Edema 12/03/2016  . Chronic venous insufficiency 12/03/2016  . Osteopenia 09/24/2016  . Artificial menopause state 08/25/2016  . Asymptomatic menopausal state 08/25/2016  . Stress at home 07/07/2016  . Insomnia 01/02/2016  . GERD (gastroesophageal reflux disease) 09/01/2015  . Acute bronchitis 06/27/2015  . Wheezing 06/27/2015  . Vitamin D deficiency 06/09/2015  . Family history of colon cancer   . Family history of breast cancer   . Cervical adenopathy 04/14/2015  . Tachycardia 04/14/2015  . Rash and nonspecific skin eruption 01/22/2015  . ADD (attention deficit disorder) 01/22/2015  . Restless leg syndrome 12/09/2014  . CFS (chronic fatigue syndrome) 12/09/2014  . OSA (obstructive sleep apnea) 12/09/2014  . Hypothyroidism 12/09/2014  . Paresthesia 12/09/2014  . Heavy tobacco smoker >10 cigarettes per day 12/09/2014  . History of DVT of lower extremity 12/09/2014  . Discoid lupus 12/09/2014    Current Outpatient Medications  Medication Sig Dispense Refill  . Albuterol Sulfate (PROAIR RESPICLICK) 703 (90 Base) MCG/ACT AEPB Inhale 1-2 puffs into the lungs 4 (four) times daily as needed. 3 each 0  . ALPRAZolam (XANAX) 0.5 MG tablet TAKE 3 TABLETS BY  MOUTH EVERY DAY AT BEDTIME AS NEEDED 90 tablet 0  . Armodafinil 200 MG TABS Take 1 tablet by mouth once daily 30 tablet 0  . aspirin (GOODSENSE ASPIRIN) 325 MG tablet Take 1 tablet (325 mg total) by mouth daily. 100 tablet 3  . buPROPion (WELLBUTRIN SR) 200 MG 12 hr tablet Take 1 tablet by mouth once daily 90 tablet 3  . calcitRIOL (ROCALTROL) 0.25 MCG capsule Take 1 capsule by mouth twice daily 180 capsule 3  . calcitRIOL (ROCALTROL) 0.5 MCG capsule Take 1 capsule (0.5 mcg total) by mouth in the morning and at bedtime. 60 capsule 11  . Cyanocobalamin 1000 MCG SUBL TAKE 1 TABLET BY MOUTH EVERY DAY 90 each 3  . diphenhydrAMINE (BENADRYL) 25 MG tablet Take 1 tablet (25 mg total) by mouth every 6 (six) hours as needed for allergies. 100 tablet 3  . EUTHYROX 100 MCG tablet TAKE 1 TABLET BY MOUTH ONCE DAILY BEFORE BREAKFAST 30 tablet 0  . gabapentin (NEURONTIN) 300 MG capsule TAKE 1 TO 2 CAPSULES BY MOUTH AT BEDTIME FOR  NERVE  PAIN 60 capsule 0  . metFORMIN (GLUCOPHAGE) 500 MG tablet Take 1 tablet by mouth once daily with breakfast 30 tablet 0  . omeprazole (PRILOSEC) 20 MG capsule Take 1 capsule by mouth once daily 90 capsule 3  . promethazine (PHENERGAN) 25 MG tablet Take 1 tablet (25 mg total) by mouth every 8 (eight) hours as needed for nausea or vomiting. 15 tablet 0  . traMADol (ULTRAM) 50 MG tablet Take 1 tablet by mouth twice daily 60 tablet 0  .  Black Cohosh 540 MG CAPS 1 po qhs (Patient not taking: Reported on 06/02/2020) 90 capsule 3  . fluticasone (FLONASE) 50 MCG/ACT nasal spray Place 2 sprays into both nostrils daily. (Patient not taking: Reported on 06/02/2020) 16 g 6  . promethazine-codeine (PHENERGAN WITH CODEINE) 6.25-10 MG/5ML syrup Take 5 mLs by mouth every 4 (four) hours as needed. (Patient not taking: Reported on 06/02/2020) 150 mL 0  . triamcinolone (KENALOG) 0.1 % Apply 1 application topically 2 (two) times daily. 30 g 0  . triamcinolone cream (KENALOG) 0.1 % Apply 1 application  topically 4 (four) times daily. (Patient not taking: Reported on 06/02/2020) 80 g 1  . valACYclovir (VALTREX) 500 MG tablet Take 1 tablet (500 mg total) by mouth 2 (two) times daily. 10 tablet 0   No current facility-administered medications for this visit.    Allergies: Dihydrotachysterol and Vitamin d analogs  Past Medical History:  Diagnosis Date  . Ankle pain   . Anxiety   . Asthma   . Back pain   . Chest pain   . Chronic fatigue syndrome   . Constipation   . COPD (chronic obstructive pulmonary disease) ([place])   . Depression   . Discoid lupus   . DVT (deep venous thrombosis) ([place])   . Dyspnea   . Family history of breast cancer   . Family history of colon cancer   . Genetic testing 06/20/2015   Negative genetic testing on the Breast/Ovarian cancer panel.  The Breast/Ovarian gene panel offered by [facility] includes sequencing and rearrangement analysis for the following 20 genes:  ATM, BARD1, BRCA1, BRCA2, BRIP1, CDH1, CHEK2, EPCAM, FANCC, MLH1, MSH2, MSH6, NBN, PALB2, PMS2, PTEN, RAD51C, RAD51D, TP53, and XRCC2.   The report date is June 19, 2015.   Marland Kitchen GERD (gastroesophageal reflux disease)   . Hot flashes   . Hypothyroidism   . Insomnia   . Leg edema   . Lupus ([place])   . Neuropathy   . Obesity   . Sciatica   . Sleep apnea     Past Surgical History:  Procedure Laterality Date  . ABDOMINAL HYSTERECTOMY    . APPENDECTOMY    . KNEE ARTHROSCOPY W/ MENISCAL REPAIR    . TUBAL LIGATION  1987  . VEIN LIGATION AND STRIPPING      Family History  Problem Relation Age of Onset  . Breast cancer Mother 7       bilateral breast cancer  . Hodgkin's lymphoma Father        dx in his 46s  . Lung cancer Father 65  . Lung cancer Sister 20       adenocarcinoma (also a smoker)  . Lupus Maternal Uncle   . Colon cancer Maternal Uncle        dx in his 20s  . Cancer Paternal Uncle        2 paternal uncles with cancer NOS  . Diabetes Maternal Grandmother   . Colon cancer Maternal  Grandfather        late 44s  . Diabetes Paternal Grandmother   . Colon cancer Paternal Grandfather 24  . Breast cancer Sister 49  . Cancer Other   . Diabetes Other   . Mental illness Other     Social History   Tobacco Use  . Smoking status: Current Every Day Smoker    Packs/day: 1.00    Types: Cigarettes  . Smokeless tobacco: Never Used  Substance Use Topics  . Alcohol use: No  Alcohol/week: 0.0 standard drinks    Subjective:  Started with painful sore on right side of her lip last Wednesday; has progressively worsened and notes that her mouth is just raw now; does have history of cold sores and admits that stress level has been very high recently; asks for work note for remainder of the week to allow her time to rest.   Objective:  Vitals:   06/02/20 1106  BP: 136/72  Pulse: 94  Temp: 98.3 F (36.8 C)  TempSrc: Oral  SpO2: 96%  Weight: 254 lb (115.2 kg)  Height: '5\' 4"'  (1.626 m)    General: Well developed, well nourished, in no acute distress  Skin : Warm and dry. Lips swollen/ erythema noted on upper lip Head: Normocephalic and atraumatic  Lungs: Respirations unlabored;  Neurologic: Alert and oriented; speech intact; face symmetrical; moves all extremities well; CNII-XII intact without focal deficit   Assessment:  1. Herpes simplex virus type 1 (HSV-1) dermatitis     Plan:  Rx for Valtrex 500 mg bid x 5 days; okay to use Triamcinolone cream on cracked skin; refill on Phenergan given as well;  Work note given as requested for next 3 days;  This visit occurred during the SARS-CoV-2 public health emergency.  Safety protocols were in place, including screening questions prior to the visit, additional usage of staff PPE, and extensive cleaning of exam room while observing appropriate contact time as indicated for disinfecting solutions.     No follow-ups on file.  No orders of the defined types were placed in this encounter.   Requested Prescriptions   Signed  Prescriptions Disp Refills  . valACYclovir (VALTREX) 500 MG tablet 10 tablet 0    Sig: Take 1 tablet (500 mg total) by mouth 2 (two) times daily.  . promethazine (PHENERGAN) 25 MG tablet 15 tablet 0    Sig: Take 1 tablet (25 mg total) by mouth every 8 (eight) hours as needed for nausea or vomiting.  . triamcinolone (KENALOG) 0.1 % 30 g 0    Sig: Apply 1 application topically 2 (two) times daily.

## 2020-06-09 ENCOUNTER — Encounter: Payer: Self-pay | Admitting: Internal Medicine

## 2020-06-09 ENCOUNTER — Other Ambulatory Visit: Payer: Self-pay

## 2020-06-09 ENCOUNTER — Ambulatory Visit: Payer: Managed Care, Other (non HMO) | Admitting: Internal Medicine

## 2020-06-09 DIAGNOSIS — E559 Vitamin D deficiency, unspecified: Secondary | ICD-10-CM

## 2020-06-09 DIAGNOSIS — Z6841 Body Mass Index (BMI) 40.0 and over, adult: Secondary | ICD-10-CM

## 2020-06-09 DIAGNOSIS — F419 Anxiety disorder, unspecified: Secondary | ICD-10-CM

## 2020-06-09 DIAGNOSIS — R7303 Prediabetes: Secondary | ICD-10-CM

## 2020-06-09 DIAGNOSIS — F3289 Other specified depressive episodes: Secondary | ICD-10-CM | POA: Diagnosis not present

## 2020-06-09 MED ORDER — ALPRAZOLAM 0.5 MG PO TABS
ORAL_TABLET | ORAL | 1 refills | Status: DC
Start: 2020-06-09 — End: 2020-12-08

## 2020-06-09 MED ORDER — BUPROPION HCL ER (SR) 200 MG PO TB12
200.0000 mg | ORAL_TABLET | Freq: Every day | ORAL | 3 refills | Status: DC
Start: 2020-06-09 — End: 2020-12-08

## 2020-06-09 MED ORDER — METFORMIN HCL 500 MG PO TABS
ORAL_TABLET | ORAL | 3 refills | Status: DC
Start: 2020-06-09 — End: 2020-12-08

## 2020-06-09 MED ORDER — TRAMADOL HCL 50 MG PO TABS: 50.0000 mg | ORAL_TABLET | Freq: Two times a day (BID) | ORAL | 1 refills | Status: DC

## 2020-06-09 MED ORDER — LEVOTHYROXINE SODIUM 100 MCG PO TABS
ORAL_TABLET | ORAL | 1 refills | Status: DC
Start: 2020-06-09 — End: 2020-12-08

## 2020-06-09 MED ORDER — GABAPENTIN 300 MG PO CAPS
ORAL_CAPSULE | ORAL | 1 refills | Status: DC
Start: 2020-06-09 — End: 2020-11-11

## 2020-06-09 MED ORDER — ARMODAFINIL 200 MG PO TABS: 1.0000 | ORAL_TABLET | Freq: Every day | ORAL | 1 refills | Status: DC

## 2020-06-09 NOTE — Progress Notes (Signed)
Subjective:  Patient ID: Tiffany Medina, female    DOB: Nov 30, 1965  Age: 54 y.o. MRN: [id number]  CC: Medication Refill   HPI Ivoree Felmlee presents for shift work, insomnia, anxiety, OA f/u   Outpatient Medications Prior to Visit  Medication Sig Dispense Refill  . Albuterol Sulfate (PROAIR RESPICLICK) 108 (90 Base) MCG/ACT AEPB Inhale 1-2 puffs into the lungs 4 (four) times daily as needed. 3 each 0  . ALPRAZolam (XANAX) 0.5 MG tablet TAKE 3 TABLETS BY MOUTH EVERY DAY AT BEDTIME AS NEEDED 90 tablet 0  . Armodafinil 200 MG TABS Take 1 tablet by mouth once daily 30 tablet 0  . aspirin (GOODSENSE ASPIRIN) 325 MG tablet Take 1 tablet (325 mg total) by mouth daily. 100 tablet 3  . buPROPion (WELLBUTRIN SR) 200 MG 12 hr tablet Take 1 tablet by mouth once daily 90 tablet 3  . calcitRIOL (ROCALTROL) 0.25 MCG capsule Take 1 capsule by mouth twice daily 180 capsule 3  . diphenhydrAMINE (BENADRYL) 25 MG tablet Take 1 tablet (25 mg total) by mouth every 6 (six) hours as needed for allergies. 100 tablet 3  . EUTHYROX 100 MCG tablet TAKE 1 TABLET BY MOUTH ONCE DAILY BEFORE BREAKFAST 30 tablet 0  . gabapentin (NEURONTIN) 300 MG capsule TAKE 1 TO 2 CAPSULES BY MOUTH AT BEDTIME FOR  NERVE  PAIN 60 capsule 0  . metFORMIN (GLUCOPHAGE) 500 MG tablet Take 1 tablet by mouth once daily with breakfast 30 tablet 0  . omeprazole (PRILOSEC) 20 MG capsule Take 1 capsule by mouth once daily 90 capsule 3  . traMADol (ULTRAM) 50 MG tablet Take 1 tablet by mouth twice daily 60 tablet 0  . Black Cohosh 540 MG CAPS 1 po qhs (Patient not taking: Reported on 06/02/2020) 90 capsule 3  . calcitRIOL (ROCALTROL) 0.5 MCG capsule Take 1 capsule (0.5 mcg total) by mouth in the morning and at bedtime. (Patient not taking: Reported on 06/09/2020) 60 capsule 11  . Cyanocobalamin 1000 MCG SUBL TAKE 1 TABLET BY MOUTH EVERY DAY (Patient not taking: Reported on 06/09/2020) 90 each 3  . fluticasone (FLONASE) 50 MCG/ACT nasal spray Place 2  sprays into both nostrils daily. (Patient not taking: Reported on 06/02/2020) 16 g 6  . promethazine (PHENERGAN) 25 MG tablet Take 1 tablet (25 mg total) by mouth every 8 (eight) hours as needed for nausea or vomiting. (Patient not taking: Reported on 06/09/2020) 15 tablet 0  . promethazine-codeine (PHENERGAN WITH CODEINE) 6.25-10 MG/5ML syrup Take 5 mLs by mouth every 4 (four) hours as needed. (Patient not taking: Reported on 06/02/2020) 150 mL 0  . triamcinolone (KENALOG) 0.1 % Apply 1 application topically 2 (two) times daily. (Patient not taking: Reported on 06/09/2020) 30 g 0  . triamcinolone cream (KENALOG) 0.1 % Apply 1 application topically 4 (four) times daily. (Patient not taking: Reported on 06/02/2020) 80 g 1  . valACYclovir (VALTREX) 500 MG tablet Take 1 tablet (500 mg total) by mouth 2 (two) times daily. (Patient not taking: Reported on 06/09/2020) 10 tablet 0   No facility-administered medications prior to visit.    ROS: Review of Systems  Constitutional: Positive for fatigue. Negative for activity change, appetite change, chills and unexpected weight change.  HENT: Negative for congestion, mouth sores and sinus pressure.   Eyes: Negative for visual disturbance.  Respiratory: Negative for cough and chest tightness.   Gastrointestinal: Negative for abdominal pain and nausea.  Genitourinary: Negative for difficulty urinating, frequency and vaginal pain.  Musculoskeletal:  Positive for arthralgias and back pain. Negative for gait problem.  Skin: Negative for pallor and rash.  Neurological: Negative for dizziness, tremors, weakness, numbness and headaches.  Psychiatric/Behavioral: Negative for confusion, sleep disturbance and suicidal ideas. The patient is nervous/anxious.     Objective:  BP 124/80 (BP Location: Left Arm)   Pulse 85   Temp 98.1 F (36.7 C) (Oral)   Wt 259 lb 6.4 oz (117.7 kg)   SpO2 97%   BMI 44.53 kg/m   BP Readings from Last 3 Encounters:  06/09/20 124/80   06/02/20 136/72  04/19/19 130/86    Wt Readings from Last 3 Encounters:  06/09/20 259 lb 6.4 oz (117.7 kg)  06/02/20 254 lb (115.2 kg)  04/19/19 273 lb (123.8 kg)    Physical Exam Constitutional:      General: She is not in acute distress.    Appearance: She is well-developed. She is obese.  HENT:     Head: Normocephalic.     Right Ear: External ear normal.     Left Ear: External ear normal.     Nose: Nose normal.  Eyes:     General:        Right eye: No discharge.        Left eye: No discharge.     Conjunctiva/sclera: Conjunctivae normal.     Pupils: Pupils are equal, round, and reactive to light.  Neck:     Thyroid: No thyromegaly.     Vascular: No JVD.     Trachea: No tracheal deviation.  Cardiovascular:     Rate and Rhythm: Normal rate and regular rhythm.     Heart sounds: Normal heart sounds.  Pulmonary:     Effort: No respiratory distress.     Breath sounds: No stridor. No wheezing.  Abdominal:     General: Bowel sounds are normal. There is no distension.     Palpations: Abdomen is soft. There is no mass.     Tenderness: There is no abdominal tenderness. There is no guarding or rebound.  Musculoskeletal:        General: Tenderness present.     Cervical back: Normal range of motion and neck supple.  Lymphadenopathy:     Cervical: No cervical adenopathy.  Skin:    Findings: No erythema or rash.  Neurological:     Cranial Nerves: No cranial nerve deficit.     Motor: No abnormal muscle tone.     Coordination: Coordination normal.     Deep Tendon Reflexes: Reflexes normal.  Psychiatric:        Behavior: Behavior normal.        Thought Content: Thought content normal.        Judgment: Judgment normal.   Lumbar spine tender with range of motion  Lab Results  Component Value Date   WBC 6.1 02/15/2020   HGB 15.4 (H) 02/15/2020   HCT 45.7 02/15/2020   PLT 369.0 02/15/2020   GLUCOSE 94 02/15/2020   CHOL 164 08/08/2017   TRIG 106 08/08/2017   HDL 46  08/08/2017   LDLCALC 97 08/08/2017   ALT 19 02/15/2020   AST 17 02/15/2020   NA 139 02/15/2020   K 4.6 02/15/2020   CL 100 02/15/2020   CREATININE 0.83 02/15/2020   BUN 13 02/15/2020   CO2 32 02/15/2020   TSH 1.37 02/15/2020   INR 1.02 11/28/2014   HGBA1C 5.9 02/15/2020    DG Finger Index Right  Result Date: 10/14/2017 CLINICAL DATA:  Slammed finger  in car door today. Swelling, and pain since. EXAM: RIGHT INDEX FINGER 2+V COMPARISON:  None. FINDINGS: No fracture.  No bone lesion. Joints are normally spaced and aligned.  No arthropathic changes. There is soft tissue swelling. Soft tissue air is seen along the proximal to mid aspect of the finger, mostly along the palmar surface. No radiopaque foreign body. IMPRESSION: 1. No fracture or dislocation. 2. Soft tissue swelling and soft tissue air, the latter consistent with a laceration. No radiopaque foreign body. Electronically Signed   By: Amie Portland M.D.   On: 10/14/2017 08:17    Assessment & Plan:   Randall was seen today for medication refill.  Diagnoses and all orders for this visit:  Prediabetes     No orders of the defined types were placed in this encounter.    Follow-up: No follow-ups on file.  Sonda Primes, MD

## 2020-06-09 NOTE — Assessment & Plan Note (Signed)
Xanax as needed  Potential benefits of a long term benzodiazepines  use as well as potential risks  and complications were explained to the patient and were aknowledged. 

## 2020-06-09 NOTE — Assessment & Plan Note (Signed)
Sleeve procedure was discussed

## 2020-06-09 NOTE — Assessment & Plan Note (Signed)
On Rocaltrol 

## 2020-08-19 ENCOUNTER — Other Ambulatory Visit: Payer: Self-pay | Admitting: Internal Medicine

## 2020-08-19 NOTE — Addendum Note (Signed)
Addended by: Deatra James on: 08/19/2020 01:27 PM   Modules accepted: Orders

## 2020-08-22 MED ORDER — ARMODAFINIL 200 MG PO TABS
1.0000 | ORAL_TABLET | Freq: Every day | ORAL | 5 refills | Status: DC
Start: 2020-08-22 — End: 2020-12-08

## 2020-10-02 ENCOUNTER — Other Ambulatory Visit: Payer: Self-pay

## 2020-10-03 ENCOUNTER — Ambulatory Visit: Payer: Managed Care, Other (non HMO) | Admitting: Family

## 2020-10-03 ENCOUNTER — Other Ambulatory Visit: Payer: Self-pay | Admitting: Family

## 2020-10-03 ENCOUNTER — Ambulatory Visit (INDEPENDENT_AMBULATORY_CARE_PROVIDER_SITE_OTHER): Payer: Managed Care, Other (non HMO)

## 2020-10-03 ENCOUNTER — Ambulatory Visit (HOSPITAL_COMMUNITY)
Admission: RE | Admit: 2020-10-03 | Discharge: 2020-10-03 | Disposition: A | Discharge status: Home / Self Care | Payer: Managed Care, Other (non HMO) | Source: Ambulatory Visit | Attending: Family | Admitting: Family

## 2020-10-03 ENCOUNTER — Encounter: Payer: Self-pay | Admitting: Family

## 2020-10-03 VITALS — BP 146/96 | HR 88 | Temp 98.0°F | Ht 63.0 in | Wt 251.8 lb

## 2020-10-03 DIAGNOSIS — M79661 Pain in right lower leg: Secondary | ICD-10-CM | POA: Insufficient documentation

## 2020-10-03 DIAGNOSIS — M7989 Other specified soft tissue disorders: Secondary | ICD-10-CM | POA: Diagnosis present

## 2020-10-03 DIAGNOSIS — M5441 Lumbago with sciatica, right side: Secondary | ICD-10-CM

## 2020-10-03 MED ORDER — HYDROCODONE-ACETAMINOPHEN 5-325 MG PO TABS: 1.0000 | ORAL_TABLET | Freq: Four times a day (QID) | ORAL | 0 refills | Status: DC | PRN

## 2020-10-03 MED ORDER — METHYLPREDNISOLONE 4 MG PO TBPK: ORAL_TABLET | ORAL | 0 refills | Status: DC

## 2020-10-03 NOTE — Progress Notes (Signed)
Tiffany Medina is a 55 y.o. female with the following history as recorded in EpicCare:  Patient Active Problem List   Diagnosis Date Noted  . Viral gastroenteritis 09/25/2019  . Anxiety disorder 11/24/2018  . Right foot pain 06/23/2018  . Contusion of foot 06/22/2018  . Laceration of right index finger without foreign body without damage to nail 10/14/2017  . Mouth sores 10/14/2017  . Tobacco abuse counseling 09/05/2017  . Prediabetes 08/08/2017  . Fatigue 04/11/2017  . Shortness of breath on exertion 04/11/2017  . Hyperglycemia 04/11/2017  . Class 3 severe obesity with serious comorbidity and body mass index (BMI) of 45.0 to 49.9 in adult ([place]) 04/11/2017  . Chest pain, atypical 03/11/2017  . Low back pain 03/11/2017  . Edema 12/03/2016  . Chronic venous insufficiency 12/03/2016  . Osteopenia 09/24/2016  . Artificial menopause state 08/25/2016  . Asymptomatic menopausal state 08/25/2016  . Stress at home 07/07/2016  . Insomnia 01/02/2016  . GERD (gastroesophageal reflux disease) 09/01/2015  . Acute bronchitis 06/27/2015  . Wheezing 06/27/2015  . Vitamin D deficiency 06/09/2015  . Family history of colon cancer   . Family history of breast cancer   . Cervical adenopathy 04/14/2015  . Tachycardia 04/14/2015  . Rash and nonspecific skin eruption 01/22/2015  . ADD (attention deficit disorder) 01/22/2015  . Restless leg syndrome 12/09/2014  . CFS (chronic fatigue syndrome) 12/09/2014  . OSA (obstructive sleep apnea) 12/09/2014  . Hypothyroidism 12/09/2014  . Paresthesia 12/09/2014  . Heavy tobacco smoker >10 cigarettes per day 12/09/2014  . History of DVT of lower extremity 12/09/2014  . Discoid lupus 12/09/2014    Current Outpatient Medications  Medication Sig Dispense Refill  . Albuterol Sulfate (PROAIR RESPICLICK) 935 (90 Base) MCG/ACT AEPB Inhale 1-2 puffs into the lungs 4 (four) times daily as needed. 3 each 0  . ALPRAZolam (XANAX) 0.5 MG tablet TAKE 3 TABLETS BY  MOUTH EVERY DAY AT BEDTIME AS NEEDED 270 tablet 1  . Armodafinil 200 MG TABS Take 1 tablet by mouth daily. 30 tablet 5  . aspirin (GOODSENSE ASPIRIN) 325 MG tablet Take 1 tablet (325 mg total) by mouth daily. 100 tablet 3  . buPROPion (WELLBUTRIN SR) 200 MG 12 hr tablet Take 1 tablet (200 mg total) by mouth daily. 90 tablet 3  . calcitRIOL (ROCALTROL) 0.25 MCG capsule Take 1 capsule by mouth twice daily 180 capsule 3  . diphenhydrAMINE (BENADRYL) 25 MG tablet Take 1 tablet (25 mg total) by mouth every 6 (six) hours as needed for allergies. 100 tablet 3  . gabapentin (NEURONTIN) 300 MG capsule TAKE 1 TO 2 CAPSULES BY MOUTH AT BEDTIME FOR  NERVE  PAIN 180 capsule 1  . levothyroxine (EUTHYROX) 100 MCG tablet TAKE 1 TABLET BY MOUTH ONCE DAILY BEFORE BREAKFAST 90 tablet 1  . metFORMIN (GLUCOPHAGE) 500 MG tablet Take 1 tablet by mouth once daily with breakfast 90 tablet 3  . omeprazole (PRILOSEC) 20 MG capsule Take 1 capsule by mouth once daily 90 capsule 3  . traMADol (ULTRAM) 50 MG tablet Take 1 tablet (50 mg total) by mouth 2 (two) times daily. 180 tablet 1   No current facility-administered medications for this visit.    Allergies: Dihydrotachysterol and Vitamin d analogs  Past Medical History:  Diagnosis Date  . Ankle pain   . Anxiety   . Asthma   . Back pain   . Chest pain   . Chronic fatigue syndrome   . Constipation   . COPD (chronic obstructive  pulmonary disease) ([place])   . Depression   . Discoid lupus   . DVT (deep venous thrombosis) ([place])   . Dyspnea   . Family history of breast cancer   . Family history of colon cancer   . Genetic testing 06/20/2015   Negative genetic testing on the Breast/Ovarian cancer panel.  The Breast/Ovarian gene panel offered by [facility] includes sequencing and rearrangement analysis for the following 20 genes:  ATM, BARD1, BRCA1, BRCA2, BRIP1, CDH1, CHEK2, EPCAM, FANCC, MLH1, MSH2, MSH6, NBN, PALB2, PMS2, PTEN, RAD51C, RAD51D, TP53, and XRCC2.   The report  date is June 19, 2015.   Marland Kitchen GERD (gastroesophageal reflux disease)   . Hot flashes   . Hypothyroidism   . Insomnia   . Leg edema   . Lupus ([place])   . Neuropathy   . Obesity   . Sciatica   . Sleep apnea     Past Surgical History:  Procedure Laterality Date  . ABDOMINAL HYSTERECTOMY    . APPENDECTOMY    . KNEE ARTHROSCOPY W/ MENISCAL REPAIR    . TUBAL LIGATION  1987  . VEIN LIGATION AND STRIPPING      Family History  Problem Relation Age of Onset  . Breast cancer Mother 41       bilateral breast cancer  . Hodgkin's lymphoma Father        dx in his 15s  . Lung cancer Father 8  . Lung cancer Sister 42       adenocarcinoma (also a smoker)  . Lupus Maternal Uncle   . Colon cancer Maternal Uncle        dx in his 3s  . Cancer Paternal Uncle        2 paternal uncles with cancer NOS  . Diabetes Maternal Grandmother   . Colon cancer Maternal Grandfather        late 54s  . Diabetes Paternal Grandmother   . Colon cancer Paternal Grandfather 54  . Breast cancer Sister 53  . Cancer Other   . Diabetes Other   . Mental illness Other     Social History   Tobacco Use  . Smoking status: Current Every Day Smoker    Packs/day: 1.00    Types: Cigarettes  . Smokeless tobacco: Never Used  Substance Use Topics  . 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  Head: Normocephalic and atraumatic  Eyes: Sclera and conjunctiva clear; pupils round and reactive to light; extraocular movements intact  Ears: External normal;  canals clear; tympanic membranes normal  Oropharynx: Pink, supple. No suspicious lesions  Neck: Supple without thyromegaly, adenopathy  Lungs: Respirations unlabored; clear to auscultation bilaterally without wheeze, rales, rhonchi  Musculoskeletal: No deformities; no active joint inflammation  Extremities: No edema, cyanosis, clubbing  Vessels: Symmetric bilaterally  Neurologic: Alert and oriented; speech intact; face symmetrical; moves all extremities well; CNII-XII intact without focal deficit   Assessment:  1. Pain and swelling of right lower leg   2. Low back pain with right-sided sciatica, unspecified back pain laterality, unspecified chronicity     Plan:  1. Due to previous history of DVT, will go ahead and check doppler; feel more likely that pain is coming from her back however; Doppler negative for DVT;  2. Update lumbar X-ray today; will go ahead and start Medrol Dose pak and Norco for pain relief; work note given to be out of work through next Tuesday; follow up to be determined;  This visit occurred during the SARS-CoV-2 public health emergency.  Safety protocols were in place, including screening questions prior to the visit, additional usage of staff PPE, and extensive cleaning of exam room while observing appropriate contact time as indicated for disinfecting solutions.     No follow-ups on file.  Orders Placed This Encounter  Procedures  . DG Lumbar Spine Complete    Standing Status:   Future    Number of Occurrences:   1    Standing Expiration Date:   10/03/2021    Order Specific Question:   Reason for Exam (SYMPTOM  OR  DIAGNOSIS REQUIRED)    Answer:   low back pain    Order Specific Question:   Is patient pregnant?    Answer:   No    Order Specific Question:   Preferred imaging location?    Answer:   [facility]    Requested Prescriptions    No prescriptions requested or ordered in this encounter

## 2020-10-07 ENCOUNTER — Telehealth: Payer: Self-pay | Admitting: Internal Medicine

## 2020-10-07 ENCOUNTER — Other Ambulatory Visit: Payer: Self-pay | Admitting: Family

## 2020-10-07 NOTE — Telephone Encounter (Signed)
I have called pt back and informed her that the letter has been sent via my-chart. She stated understanding. I have have also informed her of the next steps and she stated that if it is not improving then she will do the next step.

## 2020-10-07 NOTE — Telephone Encounter (Signed)
Yes, this is fine; updated work note in Clinical [employer] for her. If leg pain persists, I want her to see one of the sports medicine providers please.

## 2020-10-07 NOTE — Telephone Encounter (Signed)
Please advise if the note is appropriate

## 2020-10-07 NOTE — Telephone Encounter (Signed)
Patient was seen by laura on 04.01.22 and had wrote her out of work until today, states she has improved some but thinks she needs one more day. Requesting to be written out for tomorrow as well.

## 2020-10-31 NOTE — Progress Notes (Signed)
Subjective:    CC: Low back pain and radiating pain into R LE  I, Molly Weber, LAT, ATC, am serving as scribe for Dr. [name].  HPI: Pt is a 55 y/o female presenting w/ R LE pain and low back pain.  She was seen by her PCP on 10/03/20 and was more concerned about her recently worsening R leg pain/swelling and due to hx of a prior L LE DVT, she was referred to a venous doppler US. Since then, pt reports symptoms have returned and are severe. 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 Pt notes there is a knot/nodule deep within R buttock. Pt notes she is not able to rest due to the pain. Pt is to the point where this is effecting her every day life and ADLs.  Radiating pain: yes R LE numbness/tingling: no Aggravating factors: activity, sitting, laying Treatments tried: prednisone, norco  Diagnostic testing: L-spine XR- 10/03/20; R LE venous doppler US- 10/03/20  Pertinent review of Systems: no fever or chills  Relevant historical information: Sleep apnea.  Chronic fatigue syndrome.  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   Objective:    Vitals:   11/03/20 0938  BP: (!) 150/110  Pulse: 90  SpO2: 95%   General: Well Developed, well nourished, and in no acute distress.   MSK: L-spine normal-appearing Nontender midline. Decreased lumbar motion. Positive right-sided straight leg raise test. Lower extremity strength is intact with exception of right hip abduction and external rotation which is diminished 4/5 with pain.  Right hip normal. Normal motion. Tender palpation greater trochanter and along piriformis musculature. Diminished strength and abduction external rotation 4/5 with pain.   Lab and Radiology Results EXAM: LUMBAR SPINE - COMPLETE 4+ VIEW  COMPARISON:   None.  FINDINGS: Lumbar alignment within normal limits. Vertebral body heights are maintained. Lumbarized S1 segment. Mild disc space narrowing at L2-L3 and L3-L4. Facet degenerative changes of the lower lumbar spine. Degenerative osteophytes at the thoracolumbar junction.  IMPRESSION: Mild degenerative changes. No acute osseous abnormality.   Electronically Signed   By: Jasmine Pang M.D.   On: 10/03/2020 23:43  I, [name], personally (independently) visualized and performed the interpretation of the images attached in this note.  Procedure: Real-time Ultrasound Guided Injection of right greater trochanter bursa at piriformis insertion Device: Philips Affiniti 50G Images permanently stored and available for review in PACS  ultrasound evaluation prior to injection reveals no significant mass visible at area of tenderness along piriformis muscle Verbal informed consent obtained.  Discussed risks and benefits of procedure. Warned about infection bleeding damage to structures skin hypopigmentation and fat atrophy among others. Patient expresses understanding and agreement Time-out conducted.   Noted no overlying erythema, induration, or other signs of local infection.   Skin prepped in a sterile fashion.   Local anesthesia: Topical Ethyl chloride.   With sterile technique and under real time ultrasound guidance:  40 mg of Kenalog and 2 L of Marcaine injected into bursa/piriformis insertion. Fluid seen entering the bursa.   Completed without difficulty   Pain mildly resolved suggesting accurate placement of the medication.   Advised to call if fevers/chills, erythema, induration, drainage, or persistent bleeding.   Images permanently stored and available for review in  the ultrasound unit.  Impression: Technically successful ultrasound guided injection.      Impression and Recommendations:    Assessment and Plan: 55 y.o. female with right buttocks pain and radiating pain  down the right leg.  This is most consistent with right L5 lumbar radiculopathy.  She may have a component of piriformis syndrome.  Very likely she also has hip abductor tendinopathy and greater trochanteric bursitis.  She already has had significant treatment for this with her primary care provider.  She is worsening with weakness in the right lower extremity..  She has had treatment with home exercise program prednisone and gabapentin.  At this point plan to proceed with MRI lumbar spine to characterize lumbar radiculopathy.  Additionally will increase gabapentin to 900 mg and refer to physical therapy.  Recheck after lumbar MRI.  Injection today helped only a little.Marland Kitchen  PDMP not reviewed this encounter. Orders Placed This Encounter  Procedures  . [place] LIMITED JOINT SPACE STRUCTURES LOW RIGHT(NO LINKED CHARGES)    Standing Status:   Future    Number of Occurrences:   1    Standing Expiration Date:   05/06/2021    Order Specific Question:   Reason for Exam (SYMPTOM  OR DIAGNOSIS REQUIRED)    Answer:   right hip pain    Order Specific Question:   Preferred imaging location?    Answer:   Adult nurse [facility]  . MR Lumbar Spine Wo Contrast    Standing Status:   Future    Standing Expiration Date:   11/03/2021    Order Specific Question:   What is the patient's sedation requirement?    Answer:   No Sedation    Order Specific Question:   Does the patient have a pacemaker or implanted devices?    Answer:   No    Order Specific Question:   Preferred imaging location?    Answer:   [facility] (table limit-350lbs)  . Ambulatory referral to Physical Therapy    Referral Priority:   Routine    Referral Type:   Physical Medicine    Referral Reason:   Specialty Services Required    Requested Specialty:   Physical Therapy    Number of Visits Requested:   1   No orders of the defined types were placed in this encounter.   Discussed warning signs or symptoms. Please see  discharge instructions. Patient expresses understanding.   The above documentation has been reviewed and is accurate and complete [name], M.D.

## 2020-11-03 ENCOUNTER — Ambulatory Visit: Payer: Managed Care, Other (non HMO) | Admitting: Family Medicine

## 2020-11-03 ENCOUNTER — Ambulatory Visit: Payer: Self-pay

## 2020-11-03 ENCOUNTER — Other Ambulatory Visit: Payer: Self-pay

## 2020-11-03 VITALS — BP 150/110 | HR 90 | Ht 63.0 in | Wt 257.2 lb

## 2020-11-03 DIAGNOSIS — G8929 Other chronic pain: Secondary | ICD-10-CM

## 2020-11-03 DIAGNOSIS — M5416 Radiculopathy, lumbar region: Secondary | ICD-10-CM | POA: Diagnosis not present

## 2020-11-03 DIAGNOSIS — M25551 Pain in right hip: Secondary | ICD-10-CM

## 2020-11-03 NOTE — Patient Instructions (Addendum)
Thank you for coming in today.  I've referred you to Physical Therapy.  Let us know if you don't hear from them in one week.  Ok to increase gabapentin to 900mg  at night if needed.  Let me know if you do this so I can write a new Rx for the higher dose.   You should hear from MRI scheduling within 1 week. If you do not hear please let me know.   Recheck after the MRI.    Radicular Pain Radicular pain is a type of pain that spreads from your back or neck along a spinal nerve. Spinal nerves are nerves that leave the spinal cord and go to the muscles. Radicular pain is sometimes called radiculopathy, radiculitis, or a pinched nerve. When you have this type of pain, you may also have weakness, numbness, or tingling in the area of your body that is supplied by the nerve. The pain may feel sharp and burning. Depending on which spinal nerve is affected, the pain may occur in the:  Neck area (cervical radicular pain). You may also feel pain, numbness, weakness, or tingling in the arms.  Mid-spine area (thoracic radicular pain). You would feel this pain in the back and chest. This type is rare.  Lower back area (lumbar radicular pain). You would feel this pain as low back pain. You may feel pain, numbness, weakness, or tingling in the buttocks or legs. Sciatica is a type of lumbar radicular pain that shoots down the back of the leg. Radicular pain occurs when one of the spinal nerves becomes irritated or squeezed (compressed). It is often caused by something pushing on a spinal nerve, such as one of the bones of the spine (vertebrae) or one of the round cushions between vertebrae (intervertebral disks). This can result from:  An injury.  Wear and tear or aging of a disk.  The growth of a bone spur that pushes on the nerve. Radicular pain often goes away when you follow instructions from your health care provider for relieving pain at home. Follow these instructions at home: Managing pain  If  directed, put ice on the affected area: ? Put ice in a plastic bag. ? Place a towel between your skin and the bag. ? Leave the ice on for 20 minutes, 2-3 times a day.  If directed, apply heat to the affected area as often as told by your health care provider. Use the heat source that your health care provider recommends, such as a moist heat pack or a heating pad. ? Place a towel between your skin and the heat source. ? Leave the heat on for 20-30 minutes. ? Remove the heat if your skin turns bright red. This is especially important if you are unable to feel pain, heat, or cold. You may have a greater risk of getting burned.      Activity  Do not sit or rest in bed for long periods of time.  Try to stay as active as possible. Ask your health care provider what type of exercise or activity is best for you.  Avoid activities that make your pain worse, such as bending and lifting.  Do not lift anything that is heavier than 10 lb (4.5 kg), or the limit that you are told, until your health care provider says that it is safe.  Practice using proper technique when lifting items. Proper lifting technique involves bending your knees and rising up.  Do strength and range-of-motion exercises only as told  by your health care provider or physical therapist.   General instructions  Take over-the-counter and prescription medicines only as told by your health care provider.  Pay attention to any changes in your symptoms.  Keep all follow-up visits as told by your health care provider. This is important. ? Your health care provider may send you to a physical therapist to help with this pain. Contact a health care provider if:  Your pain and other symptoms get worse.  Your pain medicine is not helping.  Your pain has not improved after a few weeks of home care.  You have a fever. Get help right away if:  You have severe pain, weakness, or numbness.  You have difficulty with bladder or  bowel control. Summary  Radicular pain is a type of pain that spreads from your back or neck along a spinal nerve.  When you have radicular pain, you may also have weakness, numbness, or tingling in the area of your body that is supplied by the nerve.  The pain may feel sharp or burning.  Radicular pain may be treated with ice, heat, medicines, or physical therapy. This information is not intended to replace advice given to you by your health care provider. Make sure you discuss any questions you have with your health care provider. Document Revised: 01/03/2018 Document Reviewed: 01/03/2018 Elsevier Patient Education  2021 Elsevier Inc.    Piriformis Syndrome  Piriformis syndrome is a condition that can cause pain and numbness in your buttocks and down the back of your leg. Piriformis syndrome happens when the small muscle that connects the base of your spine to your hip (piriformis muscle) presses on the nerve that runs down the back of your leg (sciatic nerve). The piriformis muscle helps your hip rotate and helps to bring your leg back and out. It also helps shift your weight to keep you stable while you are walking. The sciatic nerve runs under or through the piriformis muscle. Damage to the piriformis muscle can cause spasms that put pressure on the nerve below. This causes pain and discomfort while sitting and moving. The pain may feel as if it begins in the buttock and spreads (radiates) down your hip and thigh. What are the causes? This condition is caused by pressure on the sciatic nerve from the piriformis muscle. The piriformis muscle can get irritated with overuse, especially if other hip muscles are weak and the piriformis muscle has to do extra work. Piriformis syndrome can also occur after an injury, like a fall onto your buttocks. What increases the risk? You are more likely to develop this condition if you:  Are a woman.  Sit for long periods of time.  Are a  cyclist.  Have weak buttocks muscles (gluteal muscles). What are the signs or symptoms? Symptoms of this condition include:  Pain, tingling, or numbness that starts in the buttock and runs down the back of your leg (sciatica).  Pain in the groin or thigh area. Your symptoms may get worse:  The longer you sit.  When you walk, run, or climb stairs.  When straining to have a bowel movement. How is this diagnosed? This condition is diagnosed based on your symptoms, medical history, and physical exam.  During the exam, your health care provider may: ? Move your leg into different positions to check for pain. ? Press on the muscles of your hip and buttock to see if that increases your symptoms.  You may also have tests, including: ?  Imaging tests such as X-rays, MRI, or ultrasound. ? Electromyogram (EMG). This test measures electrical signals sent by your nerves into the muscles. ? Nerve conduction study. This test measures how well electrical signals pass through your nerves. How is this treated? This condition may be treated by:  Stopping all activities that cause pain or make your condition worse.  Applying ice or using heat therapy.  Taking medicines to reduce pain and swelling.  Taking a muscle relaxer (muscle relaxant) to stop muscle spasms.  Doing range-of-motion and strengthening exercises (physical therapy) as told by your health care provider.  Massaging the area.  Having acupuncture.  Getting an injection of medicine in the piriformis muscle. Your health care provider will choose the medicine based on your condition. He or she may inject: ? An anti-inflammatory medicine (steroid) to reduce swelling. ? A numbing medicine (local anesthetic) to block the pain. ? Botulinum toxin. The toxin blocks nerve impulses to specific muscles to reduce muscle tension. In rare cases, you may need surgery to cut the muscle and release pressure on the nerve if other treatments do  not work. Follow these instructions at home: Activity  Do not sit for long periods. Get up and walk around every 20 minutes or as often as told by your health care provider. ? When driving long distances, make sure to take frequent stops to get up and stretch.  Use a cushion when you sit on hard surfaces.  Do exercises as told by your health care provider.  Return to your normal activities as told by your health care provider. Ask your health care provider what activities are safe for you. Managing pain, stiffness, and swelling  If directed, apply heat to the affected area as often as told by your health care provider. Use the heat source that your health care provider recommends, such as a moist heat pack or a heating pad. ? Place a towel between your skin and the heat source. ? Leave the heat on for 20-30 minutes. ? Remove the heat if your skin turns bright red. This is especially important if you are unable to feel pain, heat, or cold. You may have a greater risk of getting burned.  If directed, put ice on the injured area. ? Put ice in a plastic bag. ? Place a towel between your skin and the bag. ? Leave the ice on for 20 minutes, 2-3 times a day.      General instructions  Take over-the-counter and prescription medicines only as told by your health care provider.  Ask your health care provider if the medicine prescribed to you requires you to avoid driving or using heavy machinery.  You may need to take actions to prevent or treat constipation, such as: ? Drink enough fluid to keep your urine pale yellow. ? Take over-the-counter or prescription medicines. ? Eat foods that are high in fiber, such as beans, whole grains, and fresh fruits and vegetables. ? Limit foods that are high in fat and processed sugars, such as fried or sweet foods.  Keep all follow-up visits as told by your health care provider. This is important. How is this prevented?  Do not sit for longer than 20  minutes at a time. When you sit, choose padded surfaces.  Warm up and stretch before being active.  Cool down and stretch after being active.  Give your body time to rest between periods of activity.  Make sure to use equipment that fits you.  Maintain physical  fitness, including: ? Strength. ? Flexibility. Contact a health care provider if:  Your pain and stiffness continue or get worse.  Your leg or hip becomes weak.  You have changes in your bowel function or bladder function. Summary  Piriformis syndrome is a condition that can cause pain, tingling, and numbness in your buttocks and down the back of your leg.  You may try applying heat or ice to relieve the pain.  Do not sit for long periods. Get up and walk around every 20 minutes or as often as told by your health care provider. This information is not intended to replace advice given to you by your health care provider. Make sure you discuss any questions you have with your health care provider. Document Revised: 10/12/2018 Document Reviewed: 02/15/2018 Elsevier Patient Education  2021 [employer].

## 2020-11-10 ENCOUNTER — Telehealth: Payer: Self-pay | Admitting: Family Medicine

## 2020-11-10 ENCOUNTER — Telehealth: Payer: Self-pay

## 2020-11-10 NOTE — Telephone Encounter (Signed)
Am I able to send Tiffany Medina notes from 10/03/2020 to try and appeal since she had tried treatments with her? This Thursday would be 6 weeks of treatment, but combined effort of you and Vernona Rieger.

## 2020-11-10 NOTE — Telephone Encounter (Signed)
Received a faxed denial for patients MRI that was ordered.  Denial reasons  Imaging requires six weeks of provider directed treatment Must have some sort of follow up discussing failed treatment

## 2020-11-10 NOTE — Telephone Encounter (Signed)
Per pt, Dr. Denyse Amass increased her Gabapentin from 600 mg to 900mg . Pt requesting new rx for this dosage called into [facility] in [place].

## 2020-11-11 MED ORDER — GABAPENTIN 300 MG PO CAPS: 900.0000 mg | ORAL_CAPSULE | Freq: Every evening | ORAL | 1 refills | Status: DC | PRN

## 2020-11-11 NOTE — Telephone Encounter (Signed)
Medicine increased

## 2020-11-11 NOTE — Telephone Encounter (Signed)
Yes.  She started treatment April 1.  We should be able to get this MRI arthritis.  Please send notes from first visit with PCP.

## 2020-11-13 NOTE — Telephone Encounter (Signed)
Patient called to check on the status on her MRI and was informed it was denied and the reasons why. Patient was seen 10/03/2020 and started treatment for the Low back that radiates down legs. Patient has started physical therapy and has not had any improvement and has actually felt more pain. Informed patient that with this new information I am going to attempt an appeal. Patient stated understanding.

## 2020-11-21 ENCOUNTER — Telehealth: Payer: Self-pay | Admitting: *Deleted

## 2020-11-21 NOTE — Telephone Encounter (Signed)
Rec'd PA for Armodafinil 200 mg. Completed vis cover-my-meds w/ (Key: [id number]). Rec'd notification back " This request has been approved w/ approval dates 10/22/2020;Coverage End Date:11/21/2021. Faxed approval to pof.Marland KitchenRaechel Chute

## 2020-11-29 ENCOUNTER — Other Ambulatory Visit: Payer: Self-pay

## 2020-11-29 ENCOUNTER — Ambulatory Visit (INDEPENDENT_AMBULATORY_CARE_PROVIDER_SITE_OTHER): Payer: Managed Care, Other (non HMO)

## 2020-11-29 DIAGNOSIS — M5416 Radiculopathy, lumbar region: Secondary | ICD-10-CM | POA: Diagnosis not present

## 2020-11-29 DIAGNOSIS — M810 Age-related osteoporosis without current pathological fracture: Secondary | ICD-10-CM | POA: Diagnosis not present

## 2020-11-29 DIAGNOSIS — G8929 Other chronic pain: Secondary | ICD-10-CM | POA: Diagnosis not present

## 2020-11-29 DIAGNOSIS — M545 Low back pain, unspecified: Secondary | ICD-10-CM

## 2020-12-02 NOTE — Progress Notes (Signed)
MRI shows several levels were nerves could be pinched due to bulging disks.  We will discuss this at further detail during her follow-up visit on June 1.

## 2020-12-03 ENCOUNTER — Ambulatory Visit: Payer: Managed Care, Other (non HMO) | Admitting: Family Medicine

## 2020-12-03 ENCOUNTER — Other Ambulatory Visit: Payer: Self-pay

## 2020-12-03 VITALS — BP 118/78 | HR 91 | Wt 252.8 lb

## 2020-12-03 DIAGNOSIS — G8929 Other chronic pain: Secondary | ICD-10-CM | POA: Diagnosis not present

## 2020-12-03 DIAGNOSIS — M545 Low back pain, unspecified: Secondary | ICD-10-CM | POA: Diagnosis not present

## 2020-12-03 DIAGNOSIS — M25532 Pain in left wrist: Secondary | ICD-10-CM | POA: Diagnosis not present

## 2020-12-03 MED ORDER — CELECOXIB 100 MG PO CAPS: 100.0000 mg | ORAL_CAPSULE | Freq: Two times a day (BID) | ORAL | 1 refills | Status: DC | PRN

## 2020-12-03 MED ORDER — PREGABALIN 75 MG PO CAPS
75.0000 mg | ORAL_CAPSULE | Freq: Two times a day (BID) | ORAL | 3 refills | Status: DC | PRN
Start: 2020-12-03 — End: 2020-12-23

## 2020-12-03 NOTE — Patient Instructions (Addendum)
Thank you for coming in today.  Work hard on the home exercises in PT.   Show the PT your MRI report. This may change the plan a little.   Please call [facility] at [phone] to schedule your spine injection.   Please use Voltaren gel (Generic Diclofenac Gel) up to 4x daily for pain as needed.  This is available over-the-counter as both the name brand Voltaren gel and the generic diclofenac gel.  Any wrist compression will help.   Recheck 1 month.    Facet Joint Block The facet joints connect the bones of the spine (vertebrae). They make it possible for you to bend, twist, and make other movements with your spine. They also keep you from bending too far, twisting too far, and making other extreme movements. A facet joint block is a procedure in which a numbing medicine (anesthetic) is injected into a facet joint. In many cases, an anti-inflammatory medicine (steroid) is also injected. A facet joint block may be done:  To diagnose neck or back pain. If the pain gets better after a facet joint block, it means the pain is probably coming from the facet joint. If the pain does not get better, it means the pain is probably not coming from the facet joint.  To relieve neck or back pain that is caused by an inflamed facet joint. A facet joint block is only done to relieve pain if the pain does not improve with other methods, such as medicine, exercise programs, and physical therapy. Tell a health care provider about:  Any allergies you have.  All medicines you are taking, including vitamins, herbs, eye drops, creams, and over-the-counter medicines.  Any problems you or family members have had with anesthetic medicines.  Any blood disorders you have.  Any surgeries you have had.  Any medical conditions you have or have had.  Whether you are pregnant or may be pregnant. What are the risks? Generally, this is a safe procedure. However, problems may occur,  including:  Bleeding.  Injury to a nerve near the injection site.  Pain at the injection site.  Weakness or numbness in areas controlled by nerves near the injection site.  Infection.  Temporary fluid retention.  Allergic reactions to medicines or dyes.  Injury to other structures or organs near the injection site. What happens before the procedure? Medicines Ask your health care provider about:  Changing or stopping your regular medicines. This is especially important if you are taking diabetes medicines or blood thinners.  Taking medicines such as aspirin and ibuprofen. These medicines can thin your blood. Do not take these medicines unless your health care provider tells you to take them.  Taking over-the-counter medicines, vitamins, herbs, and supplements. Eating and drinking Follow instructions from your health care provider about eating and drinking, which may include:  8 hours before the procedure - stop eating heavy meals or foods, such as meat, fried foods, or fatty foods.  6 hours before the procedure - stop eating light meals or foods, such as toast or cereal.  6 hours before the procedure - stop drinking milk or drinks that contain milk.  2 hours before the procedure - stop drinking clear liquids. Staying hydrated Follow instructions from your health care provider about hydration, which may include:  Up to 2 hours before the procedure - you may continue to drink clear liquids, such as water, clear fruit juice, black coffee, and plain tea. General instructions  Do not use any products that  contain nicotine or tobacco for at least 4-6 weeks before the procedure. These products include cigarettes, e-cigarettes, and chewing tobacco. If you need help quitting, ask your health care provider.  Plan to have someone take you home from the hospital or clinic.  Ask your health care provider: ? How your surgery site will be marked. ? What steps will be taken to help  prevent infection. These may include:  Removing hair at the surgery site.  Washing skin with a germ-killing soap.  Receiving antibiotic medicine. What happens during the procedure?  You will put on a hospital gown.  You will lie on your stomach on an X-ray table. You may be asked to lie in a different position if an injection will be made in your neck.  Machines will be used to monitor your oxygen levels, heart rate, and blood pressure.  Your skin will be cleaned.  If an injection will be made in your neck, an IV will be inserted into one of your veins. Fluids and medicine will flow directly into your body through the IV.  A numbing medicine (local anesthetic) will be applied to your skin. Your skin may sting or burn for a moment.  A video X-ray machine (fluoroscopy) will be used to find the joint. In some cases, a CT scan may be used.  A contrast dye may be injected into the facet joint area to help find the joint.  When the joint is located, an anesthetic will be injected into the joint through the needle.  Your health care provider will ask you whether you feel pain relief. ? If you feel relief, a steroid may be injected to provide pain relief for a longer period of time. ? If you do not feel relief or feel only partial relief, additional injections of an anesthetic may be made in other facet joints.  The needle will be removed.  Your skin will be cleaned.  A bandage (dressing) will be applied over each injection site. The procedure may vary among health care providers and hospitals.   What happens after the procedure?  Your blood pressure, heart rate, breathing rate, and blood oxygen level will be monitored until you leave the hospital or clinic.  You will lie down and rest for a period of time. Summary  A facet joint block is a procedure in which a numbing medicine (anesthetic) is injected into a facet joint. An anti-inflammatory medicine (stereoid) may also be  injected.  Follow instructions from your health care provider about medicines and eating and drinking before the procedure.  Do not use any products that contain nicotine or tobacco for at least 4-6 weeks before the procedure.  You will lie on your stomach for the procedure, but you may be asked to lie in a different position if an injection will be made in your neck.  When the joint is located, an anesthetic will be injected into the joint through the needle. This information is not intended to replace advice given to you by your health care provider. Make sure you discuss any questions you have with your health care provider. Document Revised: 10/12/2018 Document Reviewed: 05/26/2018 Elsevier Patient Education  2021 [employer].

## 2020-12-03 NOTE — Progress Notes (Signed)
I, Tiffany Medina, LAT, ATC acting as a scribe for Tiffany Graham, MD.  Tiffany Medina is a 55 y.o. female who presents to [facility] at [facility] today for f/u lumbar radiculopathy/R buttock pain w/ radiating pain into the R leg and MRI review. Pt was last seen by Dr. Denyse Amass on 11/03/20 and was advised to proceed to MRI, increase gabapentin to 900mg , and was referred to [facility] PT of which she's completed 6 visits. Today, pt reports slight improvement in LBP. Pt notes she still has the knot in her buttock, rating pain to 8/10.  Pt also c/o new L wrist pain ongoing since Sunday, 11/30/20 w/ no known MOI. Pt locates pain to the ulnar aspect of L wrist w/ radiating pain into ulnar aspect of forearm into ulnar aspect of hand. Pt reports decreased in grip strength. No numbness/tingling noted.   Dx imaging: 11/29/20 L-spine MRI  10/03/20 L-spine XR   Pertinent review of systems: No fevers or chills  Relevant historical information: Sleep apnea   Exam:  BP 118/78 (BP Location: Left Arm, Patient Position: Sitting, Cuff Size: Large)   Pulse 91   Wt 252 lb 12.8 oz (114.7 kg)   SpO2 91%   BMI 44.78 kg/m  General: Well Developed, well nourished, and in no acute distress.   MSK: L-spine normal-appearing Nontender midline. Mildly tender palpation paraspinal musculature. Decreased lumbar motion.  Left wrist slightly swollen ulnar wrist. Tender palpation TFCC area. Pain with ulnar deviation.  Pain with extension and flexion all fields of the ulnar aspect of the wrist.    Lab and Radiology Results  EXAM: MRI LUMBAR SPINE WITHOUT CONTRAST  TECHNIQUE: Multiplanar, multisequence MR imaging of the lumbar spine was performed. No intravenous contrast was administered.  COMPARISON:  None.  FINDINGS: Segmentation: 5 non rib-bearing lumbar type vertebral bodies are present. The lowest fully formed vertebral body is L5.  Alignment: Slight retrolisthesis is present at T12-L1,  L1-2, and L2-3. Slight degenerative anterolisthesis is present at L4-5 and L5-S1. Left-sided pars defect noted at L5. Levoconvex curvature is centered at L2-3.  Vertebrae: Edematous endplate marrow changes are noted anteriorly at T10-11. Chronic fatty endplate marrow changes are worse left than right anteriorly at T11-12. Mild chronic fatty endplate changes are present anteriorly at L1-2. Marrow signal and vertebral body heights are otherwise normal.  Conus medullaris and cauda equina: Conus extends to the T12-L1 level. Conus and cauda equina appear normal.  Paraspinal and other soft tissues: Limited imaging the abdomen is unremarkable. There is no significant adenopathy. No solid organ lesions are present.  Disc levels:  T12-L1: Mild disc bulging is present without significant stenosis.  L1-2: Mild disc bulging and facet hypertrophy is present without significant stenosis.  L2-3: A far right lateral disc protrusion is present. Mild facet hypertrophy is noted bilaterally. Moderate right and mild left foraminal narrowing is present.  L3-4: A broad-based disc protrusion is present. Moderate facet hypertrophy present on the left. Mild broad-based disc bulging is present. Central canal is patent. Mild foraminal narrowing is slightly worse on the left.  L4-5: A broad-based disc protrusion is present scratched at there is some uncovering of the disc. Moderate facet hypertrophy is noted. Mild subarticular narrowing is present bilaterally. Foramina are patent.  L5-S1: Left L5 pars defect is present. No significant stenosis is present.  IMPRESSION: 1. Moderate right and mild left foraminal stenosis at L2-3 secondary to a far right lateral disc protrusion and bilateral facet hypertrophy. 2. Mild foraminal narrowing bilaterally  at L3-4 is slightly worse on the left. 3. Mild subarticular narrowing bilaterally at L4-5 is secondary to a broad-based disc protrusion and  moderate facet hypertrophy. 4. Left L5 pars defect without significant stenosis. This likely contributes to mild anterolisthesis.   Electronically Signed   By: Marin Roberts M.D.   On: 11/30/2020 14:06  I, Tiffany Medina, personally (independently) visualized and performed the interpretation of the images attached in this note. Facet joints bilaterally at L4-L5 appear significantly degenerative right looks worse than left.    Assessment and Plan: 55 y.o. female with  Axial back pain chronic at this point.  Patient has had physical therapy and is failing to get completely better.  The physical therapy did significantly improve the radiculopathy that she was having.  Discussed options.  Based on MRI results most likely target for treatment for back would be facet injections at L4-L5.  We will plan for bilateral injections at this level and recheck in about a month.  Extensive discussion with patient regarding facet injections medial branch block and ablation planning.  Recommend also continued home exercise program focused on core strengthening especially given the unilateral pars defect.  Left wrist pain: Unclear etiology thought to be overuse.  Recommend Voltaren gel bit of home exercise program and compression.  If not improved consider injection in 1 month or on recheck.  Prescribed Celebrex and limited Lyrica.  Hopefully this will provide a little bit better pain relief in a way that is more tolerable.  PDMP not reviewed this encounter. Orders Placed This Encounter  Procedures  . DG FACET JT INJ L /S SINGLE LEVEL LEFT W/FL/CT    Standing Status:   Future    Standing Expiration Date:   12/03/2021    Order Specific Question:   Reason for Exam (SYMPTOM  OR DIAGNOSIS REQUIRED)    Answer:   BL L4-5    Order Specific Question:   Is the patient pregnant?    Answer:   No    Order Specific Question:   Preferred Imaging Location?    Answer:   [facility]    Order Specific  Question:   Radiology Contrast Protocol - do NOT remove file path    Answer:   \\[email or web address].[email or web address]  . DG FACET JT INJ L /S SINGLE LEVEL RIGHT W/FL/CT    Standing Status:   Future    Standing Expiration Date:   12/03/2021    Order Specific Question:   Reason for Exam (SYMPTOM  OR DIAGNOSIS REQUIRED)    Answer:   BL L4-L5    Order Specific Question:   Is the patient pregnant?    Answer:   No    Order Specific Question:   Preferred Imaging Location?    Answer:   [facility]    Order Specific Question:   Radiology Contrast Protocol - do NOT remove file path    Answer:   \\[email or web address]s.pdf   Meds ordered this encounter  Medications  . celecoxib (CELEBREX) 100 MG capsule    Sig: Take 1 capsule (100 mg total) by mouth 2 (two) times daily as needed.    Dispense:  60 capsule    Refill:  1  . pregabalin (LYRICA) 75 MG capsule    Sig: Take 1 capsule (75 mg total) by mouth 2 (two) times daily as needed.    Dispense:  60 capsule    Refill:  3     Discussed warning signs or symptoms. Please see discharge instructions. Patient  expresses understanding.   The above documentation has been reviewed and is accurate and complete Tiffany Medina, M.D.

## 2020-12-08 ENCOUNTER — Ambulatory Visit: Payer: Managed Care, Other (non HMO) | Admitting: Internal Medicine

## 2020-12-08 ENCOUNTER — Other Ambulatory Visit: Payer: Self-pay

## 2020-12-08 ENCOUNTER — Encounter: Payer: Self-pay | Admitting: Internal Medicine

## 2020-12-08 VITALS — BP 124/82 | HR 100 | Temp 98.0°F | Ht 63.0 in | Wt 256.0 lb

## 2020-12-08 DIAGNOSIS — F419 Anxiety disorder, unspecified: Secondary | ICD-10-CM

## 2020-12-08 DIAGNOSIS — E038 Other specified hypothyroidism: Secondary | ICD-10-CM | POA: Diagnosis not present

## 2020-12-08 DIAGNOSIS — G4733 Obstructive sleep apnea (adult) (pediatric): Secondary | ICD-10-CM | POA: Diagnosis not present

## 2020-12-08 DIAGNOSIS — E559 Vitamin D deficiency, unspecified: Secondary | ICD-10-CM | POA: Diagnosis not present

## 2020-12-08 DIAGNOSIS — R739 Hyperglycemia, unspecified: Secondary | ICD-10-CM

## 2020-12-08 DIAGNOSIS — F439 Reaction to severe stress, unspecified: Secondary | ICD-10-CM

## 2020-12-08 DIAGNOSIS — Z6841 Body Mass Index (BMI) 40.0 and over, adult: Secondary | ICD-10-CM

## 2020-12-08 MED ORDER — ARMODAFINIL 200 MG PO TABS: 1.0000 | ORAL_TABLET | Freq: Every day | ORAL | 1 refills | Status: DC

## 2020-12-08 MED ORDER — BUPROPION HCL ER (SR) 200 MG PO TB12: 200.0000 mg | ORAL_TABLET | Freq: Every day | ORAL | 3 refills | Status: DC

## 2020-12-08 MED ORDER — METFORMIN HCL 500 MG PO TABS: ORAL_TABLET | ORAL | 3 refills | Status: DC

## 2020-12-08 MED ORDER — LEVOTHYROXINE SODIUM 100 MCG PO TABS: ORAL_TABLET | ORAL | 3 refills | Status: DC

## 2020-12-08 MED ORDER — CALCITRIOL 0.25 MCG PO CAPS: 0.2500 ug | ORAL_CAPSULE | Freq: Two times a day (BID) | ORAL | 3 refills | Status: DC

## 2020-12-08 MED ORDER — OMEPRAZOLE 20 MG PO CPDR: 1.0000 | DELAYED_RELEASE_CAPSULE | Freq: Every day | ORAL | 3 refills | Status: DC

## 2020-12-08 MED ORDER — ALPRAZOLAM 0.5 MG PO TABS: ORAL_TABLET | ORAL | 1 refills | Status: DC

## 2020-12-08 MED ORDER — TRAMADOL HCL 50 MG PO TABS: 50.0000 mg | ORAL_TABLET | Freq: Two times a day (BID) | ORAL | 1 refills | Status: DC

## 2020-12-08 NOTE — Assessment & Plan Note (Signed)
Sleeve procedure option  was discussed

## 2020-12-08 NOTE — Assessment & Plan Note (Signed)
Xanax prn short term - family stress  Potential benefits of benzodiazepines  use as well as potential risks  and complications were explained to the patient and were aknowledged.

## 2020-12-08 NOTE — Progress Notes (Signed)
Subjective:  Patient ID: Tiffany Medina, female    DOB: 07-Dec-1965  Age: 55 y.o. MRN: [id number]  CC: Follow-up (Chronic medical conditions.  Pt needs refill on tramadol and xanax)   HPI Tiffany Medina presents for leg pain, sleep disorder,OA/LBP  Dr Denyse Amassorey got the MRI:   MRI IMPRESSION LS: 1. Moderate right and mild left foraminal stenosis at L2-3 secondary to a far right lateral disc protrusion and bilateral facet hypertrophy. 2. Mild foraminal narrowing bilaterally at L3-4 is slightly worse on the left. 3. Mild subarticular narrowing bilaterally at L4-5 is secondary to a broad-based disc protrusion and moderate facet hypertrophy. 4. Left L5 pars defect without significant stenosis. This likely contributes to mild anterolisthesis.   Electronically Signed   By: Tiffany Robertshristopher  Medina M.D.   On: 11/30/2020 14:06  Outpatient Medications Prior to Visit  Medication Sig Dispense Refill  . Albuterol Sulfate (PROAIR RESPICLICK) 108 (90 Base) MCG/ACT AEPB Inhale 1-2 puffs into the lungs 4 (four) times daily as needed. 3 each 0  . ALPRAZolam (XANAX) 0.5 MG tablet TAKE 3 TABLETS BY MOUTH EVERY Medina AT BEDTIME AS NEEDED 270 tablet 1  . Armodafinil 200 MG TABS Take 1 tablet by mouth daily. 30 tablet 5  . aspirin (GOODSENSE ASPIRIN) 325 MG tablet Take 1 tablet (325 mg total) by mouth daily. 100 tablet 3  . buPROPion (WELLBUTRIN SR) 200 MG 12 hr tablet Take 1 tablet (200 mg total) by mouth daily. 90 tablet 3  . calcitRIOL (ROCALTROL) 0.25 MCG capsule Take 1 capsule by mouth twice daily 180 capsule 3  . celecoxib (CELEBREX) 100 MG capsule Take 1 capsule (100 mg total) by mouth 2 (two) times daily as needed. 60 capsule 1  . levothyroxine (EUTHYROX) 100 MCG tablet TAKE 1 TABLET BY MOUTH ONCE DAILY BEFORE BREAKFAST 90 tablet 1  . metFORMIN (GLUCOPHAGE) 500 MG tablet Take 1 tablet by mouth once daily with breakfast 90 tablet 3  . omeprazole (PRILOSEC) 20 MG capsule Take 1 capsule by mouth once  daily 90 capsule 3  . pregabalin (LYRICA) 75 MG capsule Take 1 capsule (75 mg total) by mouth 2 (two) times daily as needed. 60 capsule 3  . traMADol (ULTRAM) 50 MG tablet Take 1 tablet (50 mg total) by mouth 2 (two) times daily. 180 tablet 1  . diphenhydrAMINE (BENADRYL) 25 MG tablet Take 1 tablet (25 mg total) by mouth every 6 (six) hours as needed for allergies. 100 tablet 3  . HYDROcodone-acetaminophen (NORCO) 5-325 MG tablet Take 1 tablet by mouth every 6 (six) hours as needed for moderate pain. (Patient not taking: Reported on 11/03/2020) 15 tablet 0   No facility-administered medications prior to visit.    ROS: Review of Systems  Constitutional: Positive for unexpected weight change. Negative for activity change, appetite change, chills and fatigue.  HENT: Negative for congestion, mouth sores and sinus pressure.   Eyes: Negative for visual disturbance.  Respiratory: Negative for cough and chest tightness.   Gastrointestinal: Negative for abdominal pain and nausea.  Genitourinary: Negative for difficulty urinating, frequency and vaginal pain.  Musculoskeletal: Positive for arthralgias, back pain and gait problem.  Skin: Negative for pallor and rash.  Neurological: Negative for dizziness, tremors, weakness, numbness and headaches.  Psychiatric/Behavioral: Positive for sleep disturbance. Negative for confusion and suicidal ideas. The patient is nervous/anxious.     Objective:  BP 124/82   Pulse 100   Temp 98 F (36.7 C) (Oral)   Ht 5\' 3"  (1.6 m)   Wt  256 lb (116.1 kg)   SpO2 96%   BMI 45.35 kg/m   BP Readings from Last 3 Encounters:  12/08/20 124/82  12/03/20 118/78  11/03/20 (!) 150/110    Wt Readings from Last 3 Encounters:  12/08/20 256 lb (116.1 kg)  12/03/20 252 lb 12.8 oz (114.7 kg)  11/03/20 257 lb 3.2 oz (116.7 kg)    Physical Exam Constitutional:      General: She is not in acute distress.    Appearance: She is well-developed. She is obese.  HENT:      Head: Normocephalic.     Right Ear: External ear normal.     Left Ear: External ear normal.     Nose: Nose normal.  Eyes:     General:        Right eye: No discharge.        Left eye: No discharge.     Conjunctiva/sclera: Conjunctivae normal.     Pupils: Pupils are equal, round, and reactive to light.  Neck:     Thyroid: No thyromegaly.     Vascular: No JVD.     Trachea: No tracheal deviation.  Cardiovascular:     Rate and Rhythm: Normal rate and regular rhythm.     Heart sounds: Normal heart sounds.  Pulmonary:     Effort: No respiratory distress.     Breath sounds: No stridor. No wheezing.  Chest:     Chest wall: No tenderness.  Abdominal:     General: Bowel sounds are normal. There is no distension.     Palpations: Abdomen is soft. There is no mass.     Tenderness: There is no abdominal tenderness. There is no guarding or rebound.  Musculoskeletal:        General: Tenderness present.     Cervical back: Normal range of motion and neck supple.     Right lower leg: Edema present.  Lymphadenopathy:     Cervical: No cervical adenopathy.  Skin:    Findings: No erythema or rash.  Neurological:     Mental Status: She is oriented to person, place, and time.     Cranial Nerves: No cranial nerve deficit.     Motor: No abnormal muscle tone.     Coordination: Coordination normal.     Deep Tendon Reflexes: Reflexes normal.  Psychiatric:        Behavior: Behavior normal.        Thought Content: Thought content normal.        Judgment: Judgment normal.     Lab Results  Component Value Date   WBC 6.1 02/15/2020   HGB 15.4 (H) 02/15/2020   HCT 45.7 02/15/2020   PLT 369.0 02/15/2020   GLUCOSE 94 02/15/2020   CHOL 164 08/08/2017   TRIG 106 08/08/2017   HDL 46 08/08/2017   LDLCALC 97 08/08/2017   ALT 19 02/15/2020   AST 17 02/15/2020   NA 139 02/15/2020   K 4.6 02/15/2020   CL 100 02/15/2020   CREATININE 0.83 02/15/2020   BUN 13 02/15/2020   CO2 32 02/15/2020   TSH  1.37 02/15/2020   INR 1.02 11/28/2014   HGBA1C 5.9 02/15/2020    DG Lumbar Spine Complete  Result Date: 10/03/2020 CLINICAL DATA:  Low back pain EXAM: LUMBAR SPINE - COMPLETE 4+ VIEW COMPARISON:  None. FINDINGS: Lumbar alignment within normal limits. Vertebral body heights are maintained. Lumbarized S1 segment. Mild disc space narrowing at L2-L3 and L3-L4. Facet degenerative changes of the lower lumbar  spine. Degenerative osteophytes at the thoracolumbar junction. IMPRESSION: Mild degenerative changes. No acute osseous abnormality. Electronically Signed   By: Jasmine Pang M.D.   On: 10/03/2020 23:43   VAS [place] LOWER EXTREMITY VENOUS (DVT)  Result Date: 10/03/2020  Lower Venous DVT Study Indications: Patient has had low back pain with right hip and lateral calf pain for about 2 weeks. She denies chest pain and SOB.  Anticoagulation: 1 aspirin daily. Comparison Study: Prior left leg venous duplex exam on 12/02/2016 was negative                   for DVT. Performing Technologist: Carlos American RVT, RDCS (AE), RDMS  Examination Guidelines: A complete evaluation includes B-mode imaging, spectral Doppler, color Doppler, and power Doppler as needed of all accessible portions of each vessel. Bilateral testing is considered an integral part of a complete examination. Limited examinations for reoccurring indications may be performed as noted. The reflux portion of the exam is performed with the patient in reverse Trendelenburg.  +---------+---------------+---------+-----------+----------+--------------+ RIGHT    CompressibilityPhasicitySpontaneityPropertiesThrombus Aging +---------+---------------+---------+-----------+----------+--------------+ CFV      Full           Yes      Yes                                 +---------+---------------+---------+-----------+----------+--------------+ SFJ      Full           Yes      Yes                                  +---------+---------------+---------+-----------+----------+--------------+ FV Prox  Full           Yes      Yes                                 +---------+---------------+---------+-----------+----------+--------------+ FV Mid   Full           Yes      Yes                                 +---------+---------------+---------+-----------+----------+--------------+ FV DistalFull           Yes      Yes                                 +---------+---------------+---------+-----------+----------+--------------+ PFV      Full                                                        +---------+---------------+---------+-----------+----------+--------------+ POP      Full           Yes      Yes                                 +---------+---------------+---------+-----------+----------+--------------+ PTV      Full           Yes  Yes                                 +---------+---------------+---------+-----------+----------+--------------+ PERO     Full           Yes      Yes                                 +---------+---------------+---------+-----------+----------+--------------+ Gastroc  Full                                                        +---------+---------------+---------+-----------+----------+--------------+ GSV      Full           Yes      Yes                                 +---------+---------------+---------+-----------+----------+--------------+   +----+---------------+---------+-----------+----------+--------------+ LEFTCompressibilityPhasicitySpontaneityPropertiesThrombus Aging +----+---------------+---------+-----------+----------+--------------+ CFV Full           Yes      Yes                                 +----+---------------+---------+-----------+----------+--------------+    Findings reported to Olive Bass, FNP email through [facility] at 4:20 pm.  Summary: RIGHT: - No evidence of deep vein thrombosis in the  lower extremity. No indirect evidence of obstruction proximal to the inguinal ligament. - No cystic structure found in the popliteal fossa.  LEFT: - No evidence of common femoral vein obstruction.  *See table(s) above for measurements and observations. Electronically signed by Charlton Haws MD on 10/03/2020 at 5:45:03 PM.    Final     Assessment & Plan:   There are no diagnoses linked to this encounter.   No orders of the defined types were placed in this encounter.    Follow-up: No follow-ups on file.  Sonda Primes, MD

## 2020-12-08 NOTE — Assessment & Plan Note (Signed)
Cont w/Provigil

## 2020-12-08 NOTE — Assessment & Plan Note (Signed)
Xanax as needed  Potential benefits of a long term benzodiazepines  use as well as potential risks  and complications were explained to the patient and were aknowledged. 

## 2020-12-08 NOTE — Assessment & Plan Note (Signed)
On Rocaltrol 

## 2020-12-10 ENCOUNTER — Ambulatory Visit
Admission: RE | Admit: 2020-12-10 | Discharge: 2020-12-10 | Disposition: A | Discharge status: Home / Self Care | Payer: Managed Care, Other (non HMO) | Source: Ambulatory Visit | Attending: Family Medicine | Admitting: Family Medicine

## 2020-12-10 ENCOUNTER — Other Ambulatory Visit: Payer: Self-pay

## 2020-12-10 DIAGNOSIS — G8929 Other chronic pain: Secondary | ICD-10-CM

## 2020-12-10 DIAGNOSIS — M545 Low back pain, unspecified: Secondary | ICD-10-CM

## 2020-12-10 MED ORDER — IOPAMIDOL (ISOVUE-M 200) INJECTION 41%
1.0000 mL | Freq: Once | INTRAMUSCULAR | Status: AC
  Administered 2020-12-10: 1 mL via INTRA_ARTICULAR

## 2020-12-10 MED ORDER — METHYLPREDNISOLONE ACETATE 40 MG/ML INJ SUSP (RADIOLOG
80.0000 mg | Freq: Once | INTRAMUSCULAR | Status: AC
  Administered 2020-12-10: 80 mg via INTRA_ARTICULAR

## 2020-12-10 NOTE — Discharge Instructions (Signed)
Joint Injection Discharge Instructions  1. After your joint injection, use ice to the affected area for the next 24 hours as a temporary increase in pain is not uncommon for a day or two after your procedure.  2. Resume all medications unless otherwise instructed.  3. Common side effects of steroids include facial flushing or redness, restlessness or inability to sleep and an increase in your blood sugar if you are a diabetic.    4. Follow up with the ordering physician for post care.  5. If you have any of the following please call [phone]:      Temperature greater than 101     Pain, redness or swelling at the injection site   Thank you for visiting our office today.  

## 2020-12-23 ENCOUNTER — Other Ambulatory Visit: Payer: Self-pay | Admitting: *Deleted

## 2020-12-23 ENCOUNTER — Telehealth: Payer: Self-pay | Admitting: Family Medicine

## 2020-12-23 DIAGNOSIS — E559 Vitamin D deficiency, unspecified: Secondary | ICD-10-CM

## 2020-12-23 MED ORDER — LEVOTHYROXINE SODIUM 100 MCG PO TABS: ORAL_TABLET | ORAL | 3 refills | Status: DC

## 2020-12-23 MED ORDER — PREGABALIN 75 MG PO CAPS: 75.0000 mg | ORAL_CAPSULE | Freq: Two times a day (BID) | ORAL | 0 refills | Status: DC | PRN

## 2020-12-23 MED ORDER — CELECOXIB 100 MG PO CAPS: 100.0000 mg | ORAL_CAPSULE | Freq: Two times a day (BID) | ORAL | 0 refills | Status: DC | PRN

## 2020-12-23 MED ORDER — ARMODAFINIL 200 MG PO TABS: 1.0000 | ORAL_TABLET | Freq: Every day | ORAL | 3 refills | Status: DC

## 2020-12-23 MED ORDER — METFORMIN HCL 500 MG PO TABS: ORAL_TABLET | ORAL | 3 refills | Status: DC

## 2020-12-23 MED ORDER — CALCITRIOL 0.25 MCG PO CAPS: 0.2500 ug | ORAL_CAPSULE | Freq: Two times a day (BID) | ORAL | 3 refills | Status: DC

## 2020-12-23 MED ORDER — OMEPRAZOLE 20 MG PO CPDR: 1.0000 | DELAYED_RELEASE_CAPSULE | Freq: Every day | ORAL | 3 refills | Status: DC

## 2020-12-23 MED ORDER — BUPROPION HCL ER (SR) 200 MG PO TB12: 200.0000 mg | ORAL_TABLET | Freq: Every day | ORAL | 3 refills | Status: DC

## 2020-12-23 NOTE — Telephone Encounter (Signed)
Requesting rx for Tramadol & Alprazolam to be sent to [facility].90 day scripts...Raechel Chute

## 2020-12-23 NOTE — Telephone Encounter (Signed)
Received fax from [facility] for Lyrica and Celebrex. Needs Rx 90 supply

## 2021-01-01 ENCOUNTER — Ambulatory Visit: Payer: Managed Care, Other (non HMO) | Admitting: Family Medicine

## 2021-01-01 MED ORDER — ALPRAZOLAM 0.5 MG PO TABS: ORAL_TABLET | ORAL | 1 refills | Status: DC

## 2021-01-01 MED ORDER — TRAMADOL HCL 50 MG PO TABS: 50.0000 mg | ORAL_TABLET | Freq: Two times a day (BID) | ORAL | 1 refills | Status: DC

## 2021-01-08 ENCOUNTER — Ambulatory Visit: Payer: Managed Care, Other (non HMO) | Admitting: Family Medicine

## 2021-01-12 ENCOUNTER — Ambulatory Visit: Payer: Managed Care, Other (non HMO) | Admitting: Family Medicine

## 2021-01-21 ENCOUNTER — Ambulatory Visit (INDEPENDENT_AMBULATORY_CARE_PROVIDER_SITE_OTHER): Payer: Managed Care, Other (non HMO)

## 2021-01-21 ENCOUNTER — Encounter: Payer: Self-pay | Admitting: Family Medicine

## 2021-01-21 ENCOUNTER — Ambulatory Visit: Payer: Managed Care, Other (non HMO) | Admitting: Family Medicine

## 2021-01-21 ENCOUNTER — Ambulatory Visit: Payer: Self-pay

## 2021-01-21 ENCOUNTER — Other Ambulatory Visit: Payer: Self-pay

## 2021-01-21 VITALS — BP 150/100 | HR 101 | Ht 63.0 in | Wt 260.0 lb

## 2021-01-21 DIAGNOSIS — M25531 Pain in right wrist: Secondary | ICD-10-CM | POA: Diagnosis not present

## 2021-01-21 DIAGNOSIS — M25521 Pain in right elbow: Secondary | ICD-10-CM

## 2021-01-21 DIAGNOSIS — M25562 Pain in left knee: Secondary | ICD-10-CM

## 2021-01-21 DIAGNOSIS — M545 Low back pain, unspecified: Secondary | ICD-10-CM

## 2021-01-21 DIAGNOSIS — M7042 Prepatellar bursitis, left knee: Secondary | ICD-10-CM

## 2021-01-21 DIAGNOSIS — M25561 Pain in right knee: Secondary | ICD-10-CM

## 2021-01-21 DIAGNOSIS — S52124A Nondisplaced fracture of head of right radius, initial encounter for closed fracture: Secondary | ICD-10-CM | POA: Diagnosis not present

## 2021-01-21 DIAGNOSIS — G8929 Other chronic pain: Secondary | ICD-10-CM

## 2021-01-21 NOTE — Progress Notes (Signed)
Wynema Birch, am serving as a Neurosurgeon for Dr. [name].   Tiffany Medina is a 55 y.o. female who presents to [facility] today for f/u of LBP/lumbar radiculopathy and lumbar stenosis that flared up after suffering a fall at work and injuring her L knee which is being managed by WC.  She was last seen by Dr. Denyse Amass on 12/03/20 for f/u of lumbar radic and L-spine MRI review.  She was prescribed Celebrex and limited Lyrica and encouraged to con't PT at [facility].  She was also referred for facet injections and had B L4-5 facet injections on 12/10/20.  Since her last visit, pt fell at work on 01/08/21 when she tripped/fell due to a hole in the ground and was seen at the [facility] ED w/ c/o L knee and R wrist pain.  She was d/c to home w/ prescriptions of Zanaflex, Norco and Zofran. Patient Fel on her right side is in a sling and wearing a brace. Patient states the fall happened so fast and was so painful she had to get an Ambulance to the ED. Patient states that the R knee and leg has been painful and swollen. Patient states that when she fells she heard pops and crackles not sure it it was her knee or her back. Lower back is back to hurting.   Diagnostic testing: L-spine MRI- 11/29/20; L-spine XR- 10/03/20  Pertinent review of systems: no fever or chills  Relevant historical information: Lupus   Exam:  BP (!) 150/100 (BP Location: Left Arm, Patient Position: Sitting, Cuff Size: Normal)   Pulse (!) 101   Ht 5\' 3"  (1.6 m)   Wt 260 lb (117.9 kg)   SpO2 97%   BMI 46.06 kg/m  General: Well Developed, well nourished, and in no acute distress.   MSK: Right wrist normal-appearing normal wrist motion.  Mildly tender palpation dorsal wrist and near radial styloid and snuffbox. Grip strength intact.  Right elbow normal-appearing tender palpation mildly radial head.  Slight decreased elbow range of motion.  L-spine nontender midline.  To palpation paraspinal  musculature.  Decreased lumbar motion.  Left knee mild swelling overlying the anterior knee and lower leg. Tender palpation anterior knee. Decreased knee range of motion. Intact strength. Stable ligamentous exam.     Lab and Radiology Results  Diagnostic Limited MSK Ultrasound of: Left knee  Patellar tendon is intact.  Large to moderate amount of hypoechoic fluid tracks superficial to patellar tendon and proximal tibia consistent with prepatellar bursitis/hematoma.  Normal tendon and bony structures. Impression: Prepatellar bursitis or anterior knee hematoma.  X-ray images right wrist and right elbow obtained today personally and independently interpreted  Right wrist: No acute fractures.  Mild degenerative changes.  Right elbow: Nondisplaced radial head fracture.  Await formal radiology review  , MD - 01/08/2021  Formatting of this note might be different from the original.  CLINICAL DATA:  Left knee pain after fall.   EXAM:  LEFT KNEE - 1-2 VIEW   COMPARISON:  None.   FINDINGS:  No evidence of fracture, dislocation, or joint effusion. Mild  narrowing of medial joint space is noted. Soft tissues are  unremarkable.   IMPRESSION:  Mild degenerative joint disease is noted medially. No acute  abnormality seen.    Electronically Signed    By: 03/11/2021 M.D.    On: 01/08/2021 16:11  03/11/2021, MD - 01/08/2021  Formatting of this note  might be different from the original.  CLINICAL DATA:  Right wrist pain after fall.   EXAM:  RIGHT WRIST - 2 VIEW   COMPARISON:  None.   FINDINGS:  There is no evidence of fracture or dislocation. There is no  evidence of arthropathy or other focal bone abnormality. Soft  tissues are unremarkable.   IMPRESSION:  Negative.    Electronically Signed    By: Lupita Raider M.D.    On: 01/08/2021 16:09 Exam End: 01/08/21 15:45      Assessment and Plan: 55 y.o. female with  Exacerbation of  chronic back pain.  Patient had a recent fall was worse than her previously controlled back pain.  Plan to resume previously effective treatment of physical therapy and facet injections.  She had good benefit from bilateral facet injections at L4-5.  We will repeat this.  If not sufficient enough could consider ablation.  Additionally refer back to physical therapy.  Left anterior knee pain.  Patient x-ray at outside hospital that did not show fracture.  Ultrasound in clinic today did show prepatellar bursitis or anterior knee hematoma.  Plan for compression and Voltaren gel.  Right wrist and elbow pain: X-ray of the wrist at outside hospital 2 weeks ago was negative.  My read of the wrist today is negative however radiology overread is still pending.  She is has a well fitted thumb spica splint which is appropriate.  Unfortunately per my read she has what looks to be a nondisplaced radial head fracture at the elbow.  She has been successfully treated with this with a sling.  Wean out of the sling when able.  Recheck with me in 1 month.  This is a workers comp injury and it looks like she is referred to a different orthopedic office for [employer].  Happy to continue to see her for these issues under Worker's Comp. although her case manager will need to authorize referral.   PDMP not reviewed this encounter. Orders Placed This Encounter  Procedures   [place] LIMITED JOINT SPACE STRUCTURES LOW RIGHT(NO LINKED CHARGES)    Standing Status:   Future    Number of Occurrences:   1    Standing Expiration Date:   01/21/2022    Order Specific Question:   Reason for Exam (SYMPTOM  OR DIAGNOSIS REQUIRED)    Answer:   right knee pain    Order Specific Question:   Preferred imaging location?    Answer:   [facility] ELBOW COMPLETE RIGHT (3+VIEW)    Standing Status:   Future    Number of Occurrences:   1    Standing Expiration Date:   01/21/2022    Order Specific Question:    Reason for Exam (SYMPTOM  OR DIAGNOSIS REQUIRED)    Answer:   eval poss radial head fx    Order Specific Question:   Is patient pregnant?    Answer:   No    Order Specific Question:   Preferred imaging location?    Answer:   [facility]   DG Wrist Complete Right    Standing Status:   Future    Number of Occurrences:   1    Standing Expiration Date:   01/21/2022    Order Specific Question:   Reason for Exam (SYMPTOM  OR DIAGNOSIS REQUIRED)    Answer:   eval wrist pain    Order Specific Question:   Is patient pregnant?  Answer:   No    Order Specific Question:   Preferred imaging location?    Answer:   [facility] FACET JT INJ L /S SINGLE LEVEL LEFT W/FL/CT    Standing Status:   Future    Standing Expiration Date:   01/21/2022    Order Specific Question:   Reason for Exam (SYMPTOM  OR DIAGNOSIS REQUIRED)    Answer:   BL L4-5    Order Specific Question:   Is the patient pregnant?    Answer:   No    Order Specific Question:   Preferred Imaging Location?    Answer:   [facility]    Order Specific Question:   Radiology Contrast Protocol - do NOT remove file path    Answer:   \\[email or web address]   DG FACET JT INJ L /S SINGLE LEVEL RIGHT W/FL/CT    Standing Status:   Future    Standing Expiration Date:   01/21/2022    Order Specific Question:   Reason for Exam (SYMPTOM  OR DIAGNOSIS REQUIRED)    Answer:   BL L4-5    Order Specific Question:   Is the patient pregnant?    Answer:   No    Order Specific Question:   Preferred Imaging Location?    Answer:   [facility]    Order Specific Question:   Radiology Contrast Protocol - do NOT remove file path    Answer:   \\[email or web address]   Ambulatory referral to Physical Therapy    Referral Priority:   Routine    Referral Type:   Physical Medicine    Referral Reason:   Specialty Services Required    Requested Specialty:    Physical Therapy    Number of Visits Requested:   1   No orders of the defined types were placed in this encounter.    Discussed warning signs or symptoms. Please see discharge instructions. Patient expresses understanding.   The above documentation has been reviewed and is accurate and complete [name], M.D.

## 2021-01-21 NOTE — Patient Instructions (Signed)
Thank you for coming in today.   Please get an Xray today before you leave   I've referred you to Physical Therapy.  Let us know if you don't hear from them in one week.   Please call [facility] at [phone] to schedule your spine injection.    Please use Voltaren gel (Generic Diclofenac Gel) up to 4x daily for pain as needed.  This is available over-the-counter as both the name brand Voltaren gel and the generic diclofenac gel.    Compress the knee.   Recheck with me soon if you can with workers comp.  Let me know otherwise.

## 2021-01-22 ENCOUNTER — Encounter: Payer: Self-pay | Admitting: Family Medicine

## 2021-01-23 NOTE — Progress Notes (Signed)
X-ray does show a nondisplaced fracture of the right radial head/neck.  You have accidentally been treated correctly with this.  The typical treatment is to hold your elbow still for 1 or 2 weeks using a sling and then slowly start moving it as tolerated.  If you need more immobilization okay to use a sling but it is okay to start actually moving your elbow now. We should repeat x-ray in 2 weeks either in my clinic or with the orthopedic doctor.

## 2021-01-23 NOTE — Progress Notes (Signed)
Right wrist x-ray shows no fractures.

## 2021-03-05 ENCOUNTER — Other Ambulatory Visit: Payer: Self-pay | Admitting: Family Medicine

## 2021-03-11 ENCOUNTER — Other Ambulatory Visit: Payer: Managed Care, Other (non HMO)

## 2021-03-11 ENCOUNTER — Ambulatory Visit: Payer: Managed Care, Other (non HMO) | Admitting: Internal Medicine

## 2021-03-18 ENCOUNTER — Ambulatory Visit: Payer: Managed Care, Other (non HMO) | Admitting: Internal Medicine

## 2021-03-18 ENCOUNTER — Other Ambulatory Visit: Payer: Managed Care, Other (non HMO)

## 2021-03-20 ENCOUNTER — Other Ambulatory Visit: Payer: Self-pay | Admitting: Family Medicine

## 2021-03-20 ENCOUNTER — Other Ambulatory Visit: Payer: Self-pay | Admitting: *Deleted

## 2021-03-20 DIAGNOSIS — E559 Vitamin D deficiency, unspecified: Secondary | ICD-10-CM

## 2021-03-20 MED ORDER — CALCITRIOL 0.25 MCG PO CAPS: 0.2500 ug | ORAL_CAPSULE | Freq: Two times a day (BID) | ORAL | 3 refills | Status: DC

## 2021-04-02 ENCOUNTER — Ambulatory Visit: Payer: Managed Care, Other (non HMO) | Admitting: Internal Medicine

## 2021-04-02 ENCOUNTER — Other Ambulatory Visit: Payer: Managed Care, Other (non HMO)

## 2021-04-20 ENCOUNTER — Other Ambulatory Visit (INDEPENDENT_AMBULATORY_CARE_PROVIDER_SITE_OTHER): Payer: Managed Care, Other (non HMO)

## 2021-04-20 ENCOUNTER — Ambulatory Visit: Payer: Managed Care, Other (non HMO) | Admitting: Internal Medicine

## 2021-04-20 ENCOUNTER — Other Ambulatory Visit: Payer: Self-pay

## 2021-04-20 DIAGNOSIS — G4733 Obstructive sleep apnea (adult) (pediatric): Secondary | ICD-10-CM

## 2021-04-20 DIAGNOSIS — F419 Anxiety disorder, unspecified: Secondary | ICD-10-CM

## 2021-04-20 DIAGNOSIS — R739 Hyperglycemia, unspecified: Secondary | ICD-10-CM

## 2021-04-20 LAB — URINALYSIS
Bilirubin Urine: NEGATIVE
Hgb urine dipstick: NEGATIVE
Ketones, ur: NEGATIVE
Leukocytes,Ua: NEGATIVE
Nitrite: NEGATIVE
Specific Gravity, Urine: <=1.005 — AB (ref 1.000–1.030)
Total Protein, Urine: NEGATIVE
Urine Glucose: NEGATIVE
Urobilinogen, UA: 0.2 (ref 0.0–1.0)
pH: 7 (ref 5.0–8.0)

## 2021-04-20 LAB — COMPREHENSIVE METABOLIC PANEL
ALT: 16 U/L (ref 0–35)
AST: 15 U/L (ref 0–37)
Albumin: 4.3 g/dL (ref 3.5–5.2)
Alkaline Phosphatase: 89 U/L (ref 39–117)
BUN: 15 mg/dL (ref 6–23)
CO2: 34 mEq/L — ABNORMAL HIGH (ref 19–32)
Calcium: 9.7 mg/dL (ref 8.4–10.5)
Chloride: 100 mEq/L (ref 96–112)
Creatinine, Ser: 0.73 mg/dL (ref 0.40–1.20)
GFR: 91.9 mL/min (ref >60.00)
Glucose, Bld: 83 mg/dL (ref 70–99)
Potassium: 4.6 mEq/L (ref 3.5–5.1)
Sodium: 139 mEq/L (ref 135–145)
Total Bilirubin: 0.2 mg/dL (ref 0.2–1.2)
Total Protein: 7.3 g/dL (ref 6.0–8.3)

## 2021-04-20 LAB — LIPID PANEL
Cholesterol: 184 mg/dL (ref 0–200)
HDL: 62.5 mg/dL (ref >39.00)
LDL Cholesterol: 95 mg/dL (ref 0–99)
NonHDL: 121.05
Total CHOL/HDL Ratio: 3
Triglycerides: 128 mg/dL (ref 0.0–149.0)
VLDL: 25.6 mg/dL (ref 0.0–40.0)

## 2021-04-20 LAB — TSH: TSH: 0.51 u[IU]/mL (ref 0.35–5.50)

## 2021-04-20 LAB — HEMOGLOBIN A1C: Hgb A1c MFr Bld: 6 % (ref 4.6–6.5)

## 2021-04-30 ENCOUNTER — Ambulatory Visit: Payer: Managed Care, Other (non HMO) | Admitting: Internal Medicine

## 2021-04-30 ENCOUNTER — Encounter: Payer: Self-pay | Admitting: Internal Medicine

## 2021-04-30 ENCOUNTER — Other Ambulatory Visit: Payer: Self-pay

## 2021-04-30 DIAGNOSIS — M5440 Lumbago with sciatica, unspecified side: Secondary | ICD-10-CM | POA: Diagnosis not present

## 2021-04-30 DIAGNOSIS — W19XXXS Unspecified fall, sequela: Secondary | ICD-10-CM

## 2021-04-30 DIAGNOSIS — E038 Other specified hypothyroidism: Secondary | ICD-10-CM

## 2021-04-30 DIAGNOSIS — G9332 Myalgic encephalomyelitis/chronic fatigue syndrome: Secondary | ICD-10-CM

## 2021-04-30 DIAGNOSIS — G8929 Other chronic pain: Secondary | ICD-10-CM

## 2021-04-30 DIAGNOSIS — W19XXXA Unspecified fall, initial encounter: Secondary | ICD-10-CM | POA: Insufficient documentation

## 2021-04-30 DIAGNOSIS — E538 Deficiency of other specified B group vitamins: Secondary | ICD-10-CM

## 2021-04-30 DIAGNOSIS — F17210 Nicotine dependence, cigarettes, uncomplicated: Secondary | ICD-10-CM

## 2021-04-30 DIAGNOSIS — Z6841 Body Mass Index (BMI) 40.0 and over, adult: Secondary | ICD-10-CM

## 2021-04-30 DIAGNOSIS — E559 Vitamin D deficiency, unspecified: Secondary | ICD-10-CM

## 2021-04-30 DIAGNOSIS — R7303 Prediabetes: Secondary | ICD-10-CM

## 2021-04-30 MED ORDER — METFORMIN HCL 500 MG PO TABS: ORAL_TABLET | ORAL | 3 refills | Status: DC

## 2021-04-30 MED ORDER — TRAMADOL HCL 50 MG PO TABS: 50.0000 mg | ORAL_TABLET | Freq: Two times a day (BID) | ORAL | 1 refills | Status: DC

## 2021-04-30 MED ORDER — ALPRAZOLAM 0.5 MG PO TABS: ORAL_TABLET | ORAL | 1 refills | Status: DC

## 2021-04-30 MED ORDER — BUPROPION HCL ER (SR) 200 MG PO TB12: 200.0000 mg | ORAL_TABLET | Freq: Every day | ORAL | 3 refills | Status: DC

## 2021-04-30 MED ORDER — OMEPRAZOLE 20 MG PO CPDR: 20.0000 mg | DELAYED_RELEASE_CAPSULE | Freq: Every day | ORAL | 3 refills | Status: DC

## 2021-04-30 MED ORDER — LEVOTHYROXINE SODIUM 100 MCG PO TABS: ORAL_TABLET | ORAL | 3 refills | Status: DC

## 2021-04-30 MED ORDER — ARMODAFINIL 200 MG PO TABS: 1.0000 | ORAL_TABLET | Freq: Every day | ORAL | 3 refills | Status: DC

## 2021-04-30 MED ORDER — PREGABALIN 75 MG PO CAPS: ORAL_CAPSULE | ORAL | 1 refills | Status: DC

## 2021-04-30 MED ORDER — CALCITRIOL 0.25 MCG PO CAPS: 0.2500 ug | ORAL_CAPSULE | Freq: Two times a day (BID) | ORAL | 3 refills | Status: DC

## 2021-04-30 NOTE — Assessment & Plan Note (Signed)
a fall at work Jul 7th 2022 - R forearm fx and L knee meniscus tear - Dr Lucretia Roers in [place].

## 2021-04-30 NOTE — Assessment & Plan Note (Signed)
On Metformin.  Check A1c 

## 2021-04-30 NOTE — Assessment & Plan Note (Signed)
Cont on Tramadol prn  Potential benefits of a long term opioids use as well as potential risks (i.e. addiction risk, apnea etc) and complications (i.e. Somnolence, constipation and others) were explained to the patient and were aknowledged. 

## 2021-04-30 NOTE — Assessment & Plan Note (Signed)
Discussed.

## 2021-04-30 NOTE — Assessment & Plan Note (Signed)
Worse Go on diet Check TSH, CMET, A1c

## 2021-04-30 NOTE — Assessment & Plan Note (Signed)
On Rocaltrol 

## 2021-04-30 NOTE — Assessment & Plan Note (Signed)
On B12 inj q 30 d

## 2021-04-30 NOTE — Progress Notes (Signed)
Subjective:  Patient ID: Tiffany Medina, female    DOB: 1966/02/27  Age: 55 y.o. MRN: [id number]  CC: Follow-up (3 month f/u)   HPI Tiffany Medina presents for CFS/somnolence, anxiety, hypothyroidism S/p a fall at work Jul 7th 2022 - R forearm fx and L knee meniscus tear - Dr Lucretia Roers in [place]. C/o wt gain  Outpatient Medications Prior to Visit  Medication Sig Dispense Refill   Albuterol Sulfate (PROAIR RESPICLICK) 108 (90 Base) MCG/ACT AEPB Inhale 1-2 puffs into the lungs 4 (four) times daily as needed. 3 each 0   aspirin (GOODSENSE ASPIRIN) 325 MG tablet Take 1 tablet (325 mg total) by mouth daily. 100 tablet 3   celecoxib (CELEBREX) 100 MG capsule TAKE 1 CAPSULE TWICE A DAY AS NEEDED 180 capsule 3   ALPRAZolam (XANAX) 0.5 MG tablet TAKE 3 TABLETS BY MOUTH EVERY DAY AT BEDTIME AS NEEDED 270 tablet 1   Armodafinil 200 MG TABS Take 1 tablet by mouth daily. 90 tablet 3   buPROPion (WELLBUTRIN SR) 200 MG 12 hr tablet Take 1 tablet (200 mg total) by mouth daily. 90 tablet 3   calcitRIOL (ROCALTROL) 0.25 MCG capsule Take 1 capsule (0.25 mcg total) by mouth 2 (two) times daily. 180 capsule 3   levothyroxine (EUTHYROX) 100 MCG tablet TAKE 1 TABLET BY MOUTH ONCE DAILY BEFORE BREAKFAST 90 tablet 3   metFORMIN (GLUCOPHAGE) 500 MG tablet Take 1 tablet by mouth once daily with breakfast 90 tablet 3   omeprazole (PRILOSEC) 20 MG capsule Take 1 capsule (20 mg total) by mouth daily. 90 capsule 3   pregabalin (LYRICA) 75 MG capsule TAKE 1 CAPSULE TWICE A DAY AS NEEDED 180 capsule 0   traMADol (ULTRAM) 50 MG tablet Take 1 tablet (50 mg total) by mouth 2 (two) times daily. 180 tablet 1   No facility-administered medications prior to visit.    ROS: Review of Systems  Constitutional:  Positive for unexpected weight change. Negative for activity change, appetite change, chills and fatigue.  HENT:  Negative for congestion, mouth sores and sinus pressure.   Eyes:  Negative for visual disturbance.   Respiratory:  Negative for cough and chest tightness.   Gastrointestinal:  Negative for abdominal pain and nausea.  Genitourinary:  Negative for difficulty urinating, frequency and vaginal pain.  Musculoskeletal:  Positive for arthralgias, back pain and gait problem.  Skin:  Negative for pallor and rash.  Neurological:  Negative for dizziness, tremors, weakness, numbness and headaches.  Psychiatric/Behavioral:  Positive for decreased concentration. Negative for confusion, sleep disturbance and suicidal ideas. The patient is nervous/anxious.    Objective:  BP (!) 144/98 (BP Location: Left Arm)   Pulse 81   Temp 98 F (36.7 C) (Oral)   Ht 5\' 3"  (1.6 m)   Wt 270 lb 12.8 oz (122.8 kg)   SpO2 95%   BMI 47.97 kg/m   BP Readings from Last 3 Encounters:  04/30/21 (!) 144/98  01/21/21 (!) 150/100  12/10/20 (!) 152/79    Wt Readings from Last 3 Encounters:  04/30/21 270 lb 12.8 oz (122.8 kg)  01/21/21 260 lb (117.9 kg)  12/08/20 256 lb (116.1 kg)    Physical Exam Constitutional:      General: She is not in acute distress.    Appearance: She is well-developed. She is obese.  HENT:     Head: Normocephalic.     Right Ear: External ear normal.     Left Ear: External ear normal.     Nose:  Nose normal.  Eyes:     General:        Right eye: No discharge.        Left eye: No discharge.     Conjunctiva/sclera: Conjunctivae normal.     Pupils: Pupils are equal, round, and reactive to light.  Neck:     Thyroid: No thyromegaly.     Vascular: No JVD.     Trachea: No tracheal deviation.  Cardiovascular:     Rate and Rhythm: Normal rate and regular rhythm.     Heart sounds: Normal heart sounds.  Pulmonary:     Effort: No respiratory distress.     Breath sounds: No stridor. No wheezing.  Abdominal:     General: Bowel sounds are normal. There is no distension.     Palpations: Abdomen is soft. There is no mass.     Tenderness: There is no abdominal tenderness. There is no guarding or  rebound.  Musculoskeletal:        General: Tenderness present.     Cervical back: Normal range of motion and neck supple. No rigidity.  Lymphadenopathy:     Cervical: No cervical adenopathy.  Skin:    Findings: No erythema or rash.  Neurological:     Cranial Nerves: No cranial nerve deficit.     Motor: No abnormal muscle tone.     Coordination: Coordination normal.     Deep Tendon Reflexes: Reflexes normal.  Psychiatric:        Behavior: Behavior normal.        Thought Content: Thought content normal.        Judgment: Judgment normal.  L knee w/pain R wrist splint Antalgic gait  Lab Results  Component Value Date   WBC 6.1 02/15/2020   HGB 15.4 (H) 02/15/2020   HCT 45.7 02/15/2020   PLT 369.0 02/15/2020   GLUCOSE 83 04/20/2021   CHOL 184 04/20/2021   TRIG 128.0 04/20/2021   HDL 62.50 04/20/2021   LDLCALC 95 04/20/2021   ALT 16 04/20/2021   AST 15 04/20/2021   NA 139 04/20/2021   K 4.6 04/20/2021   CL 100 04/20/2021   CREATININE 0.73 04/20/2021   BUN 15 04/20/2021   CO2 34 (H) 04/20/2021   TSH 0.51 04/20/2021   INR 1.02 11/28/2014   HGBA1C 6.0 04/20/2021    DG FACET JT INJ L /S SINGLE LEVEL LEFT W/FL/CT  Result Date: 12/10/2020 CLINICAL DATA:  55 year old female with bilateral lower back pain. She presents for bilateral L4-L5 facet joint injection. EXAM: DG FACET JT INJ L OR S SPINE SINGLE LEVEL BILATERAL PROCEDURE: The procedure, risks, benefits, and alternatives were explained to the patient. Questions regarding the procedure were encouraged and answered. The patient understands and consents to the procedure. LEFT L5-S1 FACET INJECTION: A posterior oblique approach was taken to the facet on the left at L4-L5 using a curved 22 gauge spinal needle. Intra-articular positioning was confirmed by injecting a small amount of Isovue-M 200. No vascular opacification is seen. 40 mg of Depo-Medrol mixed with 1.5 mL 1% lidocaine were instilled into the joint. The injection resulted  in concordant pain. The procedure was well-tolerated. RIGHT L5-S1 FACET INJECTION: A posterior oblique approach was taken to the facet on the right at L4-L5 using a curved 22 gauge spinal needle. Intra-articular positioning was confirmed by injecting a small amount of Isovue-M 200. No vascular opacification is seen. 40 mg of Depo-Medrol mixed with 1.5 mL 1% lidocaine were instilled into the joint. The injection  resulted in concordant pain. The procedure was well-tolerated. IMPRESSION: Technically successful bilateral L4-L5 facet injections. Electronically Signed   By: Malachy Moan M.D.   On: 12/10/2020 10:16   DG FACET JT INJ L /S SINGLE LEVEL RIGHT W/FL/CT  Result Date: 12/10/2020 CLINICAL DATA:  55 year old female with bilateral lower back pain. She presents for bilateral L4-L5 facet joint injection. EXAM: DG FACET JT INJ L OR S SPINE SINGLE LEVEL BILATERAL PROCEDURE: The procedure, risks, benefits, and alternatives were explained to the patient. Questions regarding the procedure were encouraged and answered. The patient understands and consents to the procedure. LEFT L5-S1 FACET INJECTION: A posterior oblique approach was taken to the facet on the left at L4-L5 using a curved 22 gauge spinal needle. Intra-articular positioning was confirmed by injecting a small amount of Isovue-M 200. No vascular opacification is seen. 40 mg of Depo-Medrol mixed with 1.5 mL 1% lidocaine were instilled into the joint. The injection resulted in concordant pain. The procedure was well-tolerated. RIGHT L5-S1 FACET INJECTION: A posterior oblique approach was taken to the facet on the right at L4-L5 using a curved 22 gauge spinal needle. Intra-articular positioning was confirmed by injecting a small amount of Isovue-M 200. No vascular opacification is seen. 40 mg of Depo-Medrol mixed with 1.5 mL 1% lidocaine were instilled into the joint. The injection resulted in concordant pain. The procedure was well-tolerated. IMPRESSION:  Technically successful bilateral L4-L5 facet injections. Electronically Signed   By: Malachy Moan M.D.   On: 12/10/2020 10:16    Assessment & Plan:   Problem List Items Addressed This Visit     B12 deficiency    On B12 inj q 30 d      CFS (chronic fatigue syndrome)    Cont w/Armodafinil      Class 3 severe obesity with serious comorbidity and body mass index (BMI) of 45.0 to 49.9 in adult (HCC)    Worse Go on diet Check TSH, CMET, A1c      Relevant Medications   metFORMIN (GLUCOPHAGE) 500 MG tablet   Armodafinil 200 MG TABS   Other Relevant Orders   Amb Ref to Medical Weight Management   Fall    a fall at work Jul 7th 2022 - R forearm fx and L knee meniscus tear - Dr Lucretia Roers in [place].      Heavy tobacco smoker >10 cigarettes per day    Discussed      Hypothyroidism    Will check TSH      Relevant Medications   levothyroxine (EUTHYROX) 100 MCG tablet   Low back pain    Cont on Tramadol prn  Potential benefits of a long term opioids use as well as potential risks (i.e. addiction risk, apnea etc) and complications (i.e. Somnolence, constipation and others) were explained to the patient and were aknowledged.      Relevant Medications   traMADol (ULTRAM) 50 MG tablet   Prediabetes    On Metformin Check A1c      Relevant Orders   Amb Ref to Medical Weight Management   Vitamin D deficiency    On  Rocaltrol      Relevant Medications   calcitRIOL (ROCALTROL) 0.25 MCG capsule      Meds ordered this encounter  Medications   traMADol (ULTRAM) 50 MG tablet    Sig: Take 1 tablet (50 mg total) by mouth 2 (two) times daily.    Dispense:  180 tablet    Refill:  1   pregabalin (LYRICA)  75 MG capsule    Sig: TAKE 1 CAPSULE TWICE A DAY AS NEEDED    Dispense:  180 capsule    Refill:  1   omeprazole (PRILOSEC) 20 MG capsule    Sig: Take 1 capsule (20 mg total) by mouth daily.    Dispense:  90 capsule    Refill:  3   metFORMIN (GLUCOPHAGE) 500 MG tablet     Sig: Take 1 tablet by mouth once daily with breakfast    Dispense:  90 tablet    Refill:  3   levothyroxine (EUTHYROX) 100 MCG tablet    Sig: TAKE 1 TABLET BY MOUTH ONCE DAILY BEFORE BREAKFAST    Dispense:  90 tablet    Refill:  3   calcitRIOL (ROCALTROL) 0.25 MCG capsule    Sig: Take 1 capsule (0.25 mcg total) by mouth 2 (two) times daily.    Dispense:  180 capsule    Refill:  3   buPROPion (WELLBUTRIN SR) 200 MG 12 hr tablet    Sig: Take 1 tablet (200 mg total) by mouth daily.    Dispense:  90 tablet    Refill:  3   DISCONTD: Armodafinil 200 MG TABS    Sig: Take 1 tablet by mouth daily.    Dispense:  90 tablet    Refill:  3   ALPRAZolam (XANAX) 0.5 MG tablet    Sig: TAKE 3 TABLETS BY MOUTH EVERY DAY AT BEDTIME AS NEEDED    Dispense:  270 tablet    Refill:  1   Armodafinil 200 MG TABS    Sig: Take 1 tablet by mouth daily.    Dispense:  90 tablet    Refill:  3       Follow-up: Return in about 3 months (around 07/31/2021) for a follow-up visit.  Sonda Primes, MD

## 2021-04-30 NOTE — Assessment & Plan Note (Signed)
Cont w/Armodafinil 

## 2021-04-30 NOTE — Assessment & Plan Note (Signed)
Will check TSH

## 2021-06-05 DIAGNOSIS — M23204 Derangement of unspecified medial meniscus due to old tear or injury, left knee: Secondary | ICD-10-CM | POA: Insufficient documentation

## 2021-07-23 ENCOUNTER — Encounter: Payer: Self-pay | Admitting: Internal Medicine

## 2021-07-23 ENCOUNTER — Other Ambulatory Visit: Payer: Self-pay

## 2021-07-23 ENCOUNTER — Ambulatory Visit: Payer: Managed Care, Other (non HMO) | Admitting: Internal Medicine

## 2021-07-23 VITALS — BP 130/88 | HR 85 | Temp 98.0°F | Ht 63.0 in | Wt 282.0 lb

## 2021-07-23 DIAGNOSIS — J329 Chronic sinusitis, unspecified: Secondary | ICD-10-CM | POA: Diagnosis not present

## 2021-07-23 DIAGNOSIS — J449 Chronic obstructive pulmonary disease, unspecified: Secondary | ICD-10-CM | POA: Insufficient documentation

## 2021-07-23 DIAGNOSIS — Z716 Tobacco abuse counseling: Secondary | ICD-10-CM

## 2021-07-23 DIAGNOSIS — E559 Vitamin D deficiency, unspecified: Secondary | ICD-10-CM | POA: Diagnosis not present

## 2021-07-23 DIAGNOSIS — R7303 Prediabetes: Secondary | ICD-10-CM

## 2021-07-23 LAB — HM DIABETES EYE EXAM

## 2021-07-23 MED ORDER — HYDROCOD POLI-CHLORPHE POLI ER 10-8 MG/5ML PO SUER: 5.0000 mL | Freq: Two times a day (BID) | ORAL | 0 refills | Status: DC | PRN

## 2021-07-23 MED ORDER — AZITHROMYCIN 250 MG PO TABS: ORAL_TABLET | ORAL | 0 refills | Status: AC

## 2021-07-23 MED ORDER — FLUCONAZOLE 150 MG PO TABS: ORAL_TABLET | ORAL | 1 refills | Status: DC

## 2021-07-23 NOTE — Assessment & Plan Note (Signed)
Last vitamin D Lab Results  Component Value Date   VD25OH 27.83 (L) 02/15/2020   Low, reminded to take oral replacement 2000 u qd

## 2021-07-23 NOTE — Progress Notes (Signed)
Patient ID: Clarece Drzewiecki, female   DOB: 28-Aug-1965, 55 y.o.   MRN: [id number]        Chief Complaint: follow up sinus pain, smoking, low vit d       HPI:  Sameena Artus is a 56 y.o. female  Here with 2-3 days acute onset fever, facial pain, pressure, headache, general weakness and malaise, and greenish d/c, with mild ST and cough, but pt denies chest pain, wheezing, increased sob or doe, orthopnea, PND, increased LE swelling, palpitations, dizziness or syncope.  Not taking Vit d.  Still smoking, not ready to quit.   Pt denies polydipsia, polyuria, or new focal neuro s/s.   Pt denies recent wt loss, night sweats, loss of appetite, or other constitutional symptoms,, in fact keeps gaining wt with excess calories   Pt home tested covid neg yesterday       Wt Readings from Last 3 Encounters:  07/23/21 282 lb (127.9 kg)  04/30/21 270 lb 12.8 oz (122.8 kg)  01/21/21 260 lb (117.9 kg)   BP Readings from Last 3 Encounters:  07/23/21 130/88  04/30/21 (!) 144/98  01/21/21 (!) 150/100         Past Medical History:  Diagnosis Date   Ankle pain    Anxiety    Asthma    Back pain    Chest pain    Chronic fatigue syndrome    Constipation    COPD (chronic obstructive pulmonary disease) (HCC)    Depression    Discoid lupus    DVT (deep venous thrombosis) (HCC)    Dyspnea    Family history of breast cancer    Family history of colon cancer    Genetic testing 06/20/2015   Negative genetic testing on the Breast/Ovarian cancer panel.  The Breast/Ovarian gene panel offered by [facility] includes sequencing and rearrangement analysis for the following 20 genes:  ATM, BARD1, BRCA1, BRCA2, BRIP1, CDH1, CHEK2, EPCAM, FANCC, MLH1, MSH2, MSH6, NBN, PALB2, PMS2, PTEN, RAD51C, RAD51D, TP53, and XRCC2.   The report date is June 19, 2015.    GERD (gastroesophageal reflux disease)    Hot flashes    Hypothyroidism    Insomnia    Leg edema    Lupus ([place])    Neuropathy    Obesity    Sciatica    Sleep apnea     Past Surgical History:  Procedure Laterality Date   ABDOMINAL HYSTERECTOMY     APPENDECTOMY     KNEE ARTHROSCOPY W/ MENISCAL REPAIR     TUBAL LIGATION  1987   VEIN LIGATION AND STRIPPING      reports that she has been smoking cigarettes. She has been smoking an average of 1 pack per day. She has never used smokeless tobacco. She reports that she does not drink alcohol and does not use drugs. family history includes Breast cancer (age of onset: 28) in her mother; Breast cancer (age of onset: 44) in her sister; Cancer in her paternal uncle and another family member; Colon cancer in her maternal grandfather and maternal uncle; Colon cancer (age of onset: 56) in her paternal grandfather; Diabetes in her maternal grandmother, paternal grandmother, and another family member; Hodgkin's lymphoma in her father; Lung cancer (age of onset: 72) in her sister; Lung cancer (age of onset: 25) in her father; Lupus in her maternal uncle; Mental illness in an other family member. Allergies  Allergen Reactions   Dihydrotachysterol Hives, Itching and Rash   Vitamin D Analogs Hives, Itching and Rash  Flushing   Current Outpatient Medications on File Prior to Visit  Medication Sig Dispense Refill   Albuterol Sulfate (PROAIR RESPICLICK) 482 (90 Base) MCG/ACT AEPB Inhale 1-2 puffs into the lungs 4 (four) times daily as needed. 3 each 0   ALPRAZolam (XANAX) 0.5 MG tablet TAKE 3 TABLETS BY MOUTH EVERY DAY AT BEDTIME AS NEEDED 270 tablet 1   Armodafinil 200 MG TABS Take 1 tablet by mouth daily. 90 tablet 3   aspirin (GOODSENSE ASPIRIN) 325 MG tablet Take 1 tablet (325 mg total) by mouth daily. 100 tablet 3   buPROPion (WELLBUTRIN SR) 200 MG 12 hr tablet Take 1 tablet (200 mg total) by mouth daily. 90 tablet 3   calcitRIOL (ROCALTROL) 0.25 MCG capsule Take 1 capsule (0.25 mcg total) by mouth 2 (two) times daily. 180 capsule 3   celecoxib (CELEBREX) 100 MG capsule TAKE 1 CAPSULE TWICE A DAY AS NEEDED 180 capsule 3    levothyroxine (EUTHYROX) 100 MCG tablet TAKE 1 TABLET BY MOUTH ONCE DAILY BEFORE BREAKFAST 90 tablet 3   metFORMIN (GLUCOPHAGE) 500 MG tablet Take 1 tablet by mouth once daily with breakfast 90 tablet 3   omeprazole (PRILOSEC) 20 MG capsule Take 1 capsule (20 mg total) by mouth daily. 90 capsule 3   pregabalin (LYRICA) 75 MG capsule TAKE 1 CAPSULE TWICE A DAY AS NEEDED 180 capsule 1   traMADol (ULTRAM) 50 MG tablet Take 1 tablet (50 mg total) by mouth 2 (two) times daily. 180 tablet 1   No current facility-administered medications on file prior to visit.        ROS:  All others reviewed and negative.  Objective        PE:  BP 130/88 (BP Location: Left Arm, Patient Position: Sitting, Cuff Size: Large)    Pulse 85    Temp 98 F (36.7 C) (Oral)    Ht '5\' 3"'  (1.6 m)    Wt 282 lb (127.9 kg)    SpO2 95%    BMI 49.95 kg/m                 Constitutional: Pt appears in NAD, mild ill               HENT: Head: NCAT.                Right Ear: External ear normal.                 Left Ear: External ear normal. Bilat tm's with mild erythema.  Max sinus areas mild tender.  Pharynx with mild erythema, no exudate               Eyes: . Pupils are equal, round, and reactive to light. Conjunctivae and EOM are normal               Nose: without d/c or deformity               Neck: Neck supple. Gross normal ROM               Cardiovascular: Normal rate and regular rhythm.                 Pulmonary/Chest: Effort normal and breath sounds without rales or wheezing.                               Neurological: Pt is alert. At baseline orientation, motor grossly intact  Skin: Skin is warm. No rashes, no other new lesions, LE edema - none               Psychiatric: Pt behavior is normal without agitation   Micro: none  Cardiac tracings I have personally interpreted today:  none  Pertinent Radiological findings (summarize): none   Lab Results  Component Value Date   WBC 6.1 02/15/2020   HGB  15.4 (H) 02/15/2020   HCT 45.7 02/15/2020   PLT 369.0 02/15/2020   GLUCOSE 83 04/20/2021   CHOL 184 04/20/2021   TRIG 128.0 04/20/2021   HDL 62.50 04/20/2021   LDLCALC 95 04/20/2021   ALT 16 04/20/2021   AST 15 04/20/2021   NA 139 04/20/2021   K 4.6 04/20/2021   CL 100 04/20/2021   CREATININE 0.73 04/20/2021   BUN 15 04/20/2021   CO2 34 (H) 04/20/2021   TSH 0.51 04/20/2021   INR 1.02 11/28/2014   HGBA1C 6.0 04/20/2021   Assessment/Plan:  Jasmain Ahlberg is a 56 y.o. White or Caucasian [1] female with  has a past medical history of Ankle pain, Anxiety, Asthma, Back pain, Chest pain, Chronic fatigue syndrome, Constipation, COPD (chronic obstructive pulmonary disease) ([place]), Depression, Discoid lupus, DVT (deep venous thrombosis) ([place]), Dyspnea, Family history of breast cancer, Family history of colon cancer, Genetic testing (06/20/2015), GERD (gastroesophageal reflux disease), Hot flashes, Hypothyroidism, Insomnia, Leg edema, Lupus ([place]), Neuropathy, Obesity, Sciatica, and Sleep apnea.  Vitamin D deficiency Last vitamin D Lab Results  Component Value Date   VD25OH 27.83 (L) 02/15/2020   Low, reminded to take oral replacement 2000 u qd   Sinusitis Mild to mod, for antibx course,  to f/u any worsening symptoms or concerns  Prediabetes Lab Results  Component Value Date   HGBA1C 6.0 04/20/2021   Stable, pt to continue current medical treatment  - diet  COPD (chronic obstructive pulmonary disease) ([place]) Stable without wheezing, sob - to continue inhaler prn  Tobacco abuse counseling Pt counseled to quit, pt not ready  Followup: Return if symptoms worsen or fail to improve.  Cathlean Cower, MD 07/26/2021 7:00 PM [facility]

## 2021-07-23 NOTE — Patient Instructions (Signed)
Please take all new medication as prescribed - the antibiotic, cough medicine, and diflucan  Please continue all other medications as before, and refills have been done if requested.  Please have the pharmacy call with any other refills you may need.  Please keep your appointments with your specialists as you may have planned

## 2021-07-26 ENCOUNTER — Encounter: Payer: Self-pay | Admitting: Internal Medicine

## 2021-07-26 DIAGNOSIS — J329 Chronic sinusitis, unspecified: Secondary | ICD-10-CM | POA: Insufficient documentation

## 2021-07-26 NOTE — Assessment & Plan Note (Signed)
Pt counseled to quit, pt not ready 

## 2021-07-26 NOTE — Assessment & Plan Note (Signed)
Lab Results  Component Value Date   HGBA1C 6.0 04/20/2021   Stable, pt to continue current medical treatment  - diet

## 2021-07-26 NOTE — Assessment & Plan Note (Signed)
Stable without wheezing, sob - to continue inhaler prn

## 2021-07-26 NOTE — Assessment & Plan Note (Signed)
Mild to mod, for antibx course,  to f/u any worsening symptoms or concerns 

## 2021-07-28 ENCOUNTER — Encounter: Payer: Self-pay | Admitting: Internal Medicine

## 2021-08-04 NOTE — Progress Notes (Signed)
I, Tiffany Medina, LAT, ATC, am serving as scribe for Dr. Clementeen Medina.  Tiffany Medina is a 56 y.o. female who presents to [facility] today for f/u of chronic LBP due to lumbar  radiculopathy and lumbar stenosis.  She was last seen by Dr. Denyse Medina on 01/21/21 after suffering a fall at work on 01/08/21, experiencing B knee and R wrist pain.  Prior to that, pt was seen on 12/03/20 for LBP f/u after having B L4-5 facet injections on 12/10/20.  Today, pt reports that her low back pain has been flared up since the fall of 2022.  She has been "put out of work" until she sees an orthopedist regarding her knee / possible knee replacement.  She states that she will be without insurance by the end of the month.  Today, she reports B LBP w/ radiating pain running into her B legs, L>R, w/ paresthesias.  She also mentions that she has had some weight gain and is having difficulty losing weight as she has been less active recently.  Diagnostic testing: L-spine MRI- 11/29/20; L-spine XR- 10/03/20  Pertinent review of systems: No fevers or chills  Relevant historical information: COPD.  Chronic fatigue syndrome.  History of elbow injury. Obesity  Exam:  BP 124/84 (BP Location: Left Arm, Patient Position: Sitting, Cuff Size: Large)    Pulse 89    Ht 5\' 3"  (1.6 m)    Wt 281 lb 6.4 oz (127.6 kg)    SpO2 92%    BMI 49.85 kg/m  Wt Readings from Last 5 Encounters:  08/05/21 281 lb 6.4 oz (127.6 kg)  07/23/21 282 lb (127.9 kg)  04/30/21 270 lb 12.8 oz (122.8 kg)  01/21/21 260 lb (117.9 kg)  12/08/20 256 lb (116.1 kg)    General: Well Developed, well nourished, and in no acute distress.   MSK: L-spine nontender to midline.  Decreased lumbar motion.    Lab and Radiology Results No results found for this or any previous visit (from the past 72 hour(s)). No results found.     Assessment and Plan: 56 y.o. female with  Chronic low back pain.  Patient has facet DJD at L4-L5 and had  previous reasonable benefit from facet injection bilaterally at this level last year.  Plan for repeat facet injections and recheck back as needed. She will have a gap in her insurance after February so we should do this now.  Additionally we spent time talking about obesity management strategies as her obesity is probably contributing to her back pain.  Based on her age and weight and height her calorie goal for weight loss should be around 1700 cal/day and 30% of calories with protein equals about 120 g of protein per day.  Recommend really trying to eat a breakfast every morning especially protein instead of eating 1 large meal at bedtime.    PDMP not reviewed this encounter. Orders Placed This Encounter  Procedures   DG INJECT DIAG/THERA/INC NEEDLE/CATH/PLC EPI/LUMB/SAC W/IMG    L4-5 injection for lumbar radiculopathy.  Technique and level per radiology.    Standing Status:   Future    Standing Expiration Date:   08/05/2022    Scheduling Instructions:     L4-5 injection for lumbar radiculopathy.  Technique and level per radiology.    Order Specific Question:   Reason for Exam (SYMPTOM  OR DIAGNOSIS REQUIRED)    Answer:   lumbar radiculopathy    Order Specific Question:   Is  the patient pregnant?    Answer:   No    Order Specific Question:   Preferred Imaging Location?    Answer:   [id number]   No orders of the defined types were placed in this encounter.    Discussed warning signs or symptoms. Please see discharge instructions. Patient expresses understanding.   The above documentation has been reviewed and is accurate and complete Tiffany Medina, M.D.

## 2021-08-05 ENCOUNTER — Encounter: Payer: Self-pay | Admitting: Family Medicine

## 2021-08-05 ENCOUNTER — Other Ambulatory Visit: Payer: Self-pay

## 2021-08-05 ENCOUNTER — Encounter: Payer: Self-pay | Admitting: Internal Medicine

## 2021-08-05 ENCOUNTER — Ambulatory Visit: Payer: Managed Care, Other (non HMO) | Admitting: Internal Medicine

## 2021-08-05 ENCOUNTER — Ambulatory Visit: Payer: Managed Care, Other (non HMO) | Admitting: Family Medicine

## 2021-08-05 VITALS — BP 124/84 | HR 89 | Ht 63.0 in | Wt 281.4 lb

## 2021-08-05 DIAGNOSIS — E538 Deficiency of other specified B group vitamins: Secondary | ICD-10-CM

## 2021-08-05 DIAGNOSIS — M5416 Radiculopathy, lumbar region: Secondary | ICD-10-CM | POA: Diagnosis not present

## 2021-08-05 DIAGNOSIS — R7303 Prediabetes: Secondary | ICD-10-CM

## 2021-08-05 DIAGNOSIS — G8929 Other chronic pain: Secondary | ICD-10-CM

## 2021-08-05 DIAGNOSIS — M545 Low back pain, unspecified: Secondary | ICD-10-CM

## 2021-08-05 DIAGNOSIS — E559 Vitamin D deficiency, unspecified: Secondary | ICD-10-CM | POA: Diagnosis not present

## 2021-08-05 DIAGNOSIS — E038 Other specified hypothyroidism: Secondary | ICD-10-CM

## 2021-08-05 DIAGNOSIS — Z6841 Body Mass Index (BMI) 40.0 and over, adult: Secondary | ICD-10-CM

## 2021-08-05 DIAGNOSIS — M25562 Pain in left knee: Secondary | ICD-10-CM | POA: Insufficient documentation

## 2021-08-05 DIAGNOSIS — G9332 Myalgic encephalomyelitis/chronic fatigue syndrome: Secondary | ICD-10-CM

## 2021-08-05 MED ORDER — TRAMADOL HCL 50 MG PO TABS: 50.0000 mg | ORAL_TABLET | Freq: Two times a day (BID) | ORAL | 1 refills | Status: DC | PRN

## 2021-08-05 MED ORDER — PHENTERMINE HCL 37.5 MG PO TABS: 37.5000 mg | ORAL_TABLET | Freq: Every day | ORAL | 0 refills | Status: DC

## 2021-08-05 MED ORDER — PREGABALIN 75 MG PO CAPS: ORAL_CAPSULE | ORAL | 1 refills | Status: DC

## 2021-08-05 MED ORDER — OZEMPIC (0.25 OR 0.5 MG/DOSE) 2 MG/1.5ML ~~LOC~~ SOPN: 0.5000 mg | PEN_INJECTOR | SUBCUTANEOUS | 1 refills | Status: DC

## 2021-08-05 MED ORDER — ALPRAZOLAM 0.5 MG PO TABS: ORAL_TABLET | ORAL | 1 refills | Status: DC

## 2021-08-05 NOTE — Assessment & Plan Note (Signed)
On B12 inj q 30 d 

## 2021-08-05 NOTE — Patient Instructions (Addendum)
Good to see you today.  I've ordered a back injection.  Please call [facility] to schedule at [phone] or [phone].  Calorie: 1700 calories and 127 grams of protein/day  Follow-up as needed.

## 2021-08-05 NOTE — Assessment & Plan Note (Signed)
Start w/wt loss

## 2021-08-05 NOTE — Assessment & Plan Note (Signed)
Start Adipex, Ozempic weekly

## 2021-08-05 NOTE — Assessment & Plan Note (Signed)
Cont on Levothroid 

## 2021-08-05 NOTE — Assessment & Plan Note (Signed)
Probably worse due to wt gain. Start Ozempic sq. Cont Metformin

## 2021-08-05 NOTE — Assessment & Plan Note (Signed)
On Vit D 

## 2021-08-05 NOTE — Progress Notes (Signed)
Subjective:  Patient ID: Tiffany Medina, female    DOB: 28-Feb-1966  Age: 56 y.o. MRN: [id number]  CC: Follow-up (3 month f/u)   HPI Tiffany Medina presents for CFS/somnolence, anxiety, hypothyroidism S/p a fall at work Jul 7th 2022 - R forearm fx and L knee meniscus tear - Dr Clydene Laming in [place]. C/o wt gain 22 lbs  Outpatient Medications Prior to Visit  Medication Sig Dispense Refill   Albuterol Sulfate (PROAIR RESPICLICK) 123XX123 (90 Base) MCG/ACT AEPB Inhale 1-2 puffs into the lungs 4 (four) times daily as needed. 3 each 0   Armodafinil 200 MG TABS Take 1 tablet by mouth daily. 90 tablet 3   aspirin (GOODSENSE ASPIRIN) 325 MG tablet Take 1 tablet (325 mg total) by mouth daily. 100 tablet 3   buPROPion (WELLBUTRIN SR) 200 MG 12 hr tablet Take 1 tablet (200 mg total) by mouth daily. 90 tablet 3   calcitRIOL (ROCALTROL) 0.25 MCG capsule Take 1 capsule (0.25 mcg total) by mouth 2 (two) times daily. 180 capsule 3   celecoxib (CELEBREX) 100 MG capsule TAKE 1 CAPSULE TWICE A DAY AS NEEDED 180 capsule 3   fluconazole (DIFLUCAN) 150 MG tablet 1 tab by mouth every 3 days as needed 2 tablet 1   levothyroxine (EUTHYROX) 100 MCG tablet TAKE 1 TABLET BY MOUTH ONCE DAILY BEFORE BREAKFAST 90 tablet 3   metFORMIN (GLUCOPHAGE) 500 MG tablet Take 1 tablet by mouth once daily with breakfast 90 tablet 3   omeprazole (PRILOSEC) 20 MG capsule Take 1 capsule (20 mg total) by mouth daily. 90 capsule 3   ALPRAZolam (XANAX) 0.5 MG tablet TAKE 3 TABLETS BY MOUTH EVERY DAY AT BEDTIME AS NEEDED 270 tablet 1   chlorpheniramine-HYDROcodone (TUSSIONEX PENNKINETIC ER) 10-8 MG/5ML Take 5 mLs by mouth every 12 (twelve) hours as needed for cough. 115 mL 0   pregabalin (LYRICA) 75 MG capsule TAKE 1 CAPSULE TWICE A DAY AS NEEDED 180 capsule 1   traMADol (ULTRAM) 50 MG tablet Take 1 tablet (50 mg total) by mouth 2 (two) times daily. 180 tablet 1   No facility-administered medications prior to visit.    ROS: Review of  Systems  Constitutional:  Positive for fatigue and unexpected weight change. Negative for activity change, appetite change and chills.  HENT:  Negative for congestion, mouth sores and sinus pressure.   Eyes:  Negative for visual disturbance.  Respiratory:  Negative for cough and chest tightness.   Gastrointestinal:  Negative for abdominal pain and nausea.  Genitourinary:  Negative for difficulty urinating, frequency and vaginal pain.  Musculoskeletal:  Positive for arthralgias, back pain and gait problem.  Skin:  Negative for pallor and rash.  Neurological:  Negative for dizziness, tremors, weakness, numbness and headaches.  Psychiatric/Behavioral:  Positive for decreased concentration and sleep disturbance. Negative for confusion.    Objective:  BP (!) 142/86 (BP Location: Left Arm)    Pulse 85    Temp 98.5 F (36.9 C) (Oral)    Ht 5\' 3"  (1.6 m)    Wt 280 lb 3.2 oz (127.1 kg)    SpO2 94%    BMI 49.64 kg/m   BP Readings from Last 3 Encounters:  08/05/21 (!) 142/86  08/05/21 124/84  07/23/21 130/88    Wt Readings from Last 3 Encounters:  08/05/21 280 lb 3.2 oz (127.1 kg)  08/05/21 281 lb 6.4 oz (127.6 kg)  07/23/21 282 lb (127.9 kg)    Physical Exam Constitutional:      General: She  is not in acute distress.    Appearance: She is well-developed. She is obese.  HENT:     Head: Normocephalic.     Right Ear: External ear normal.     Left Ear: External ear normal.     Nose: Nose normal.  Eyes:     General:        Right eye: No discharge.        Left eye: No discharge.     Conjunctiva/sclera: Conjunctivae normal.     Pupils: Pupils are equal, round, and reactive to light.  Neck:     Thyroid: No thyromegaly.     Vascular: No JVD.     Trachea: No tracheal deviation.  Cardiovascular:     Rate and Rhythm: Normal rate and regular rhythm.     Heart sounds: Normal heart sounds.  Pulmonary:     Effort: No respiratory distress.     Breath sounds: No stridor. No wheezing.   Abdominal:     General: Bowel sounds are normal. There is no distension.     Palpations: Abdomen is soft. There is no mass.     Tenderness: There is no abdominal tenderness. There is no guarding or rebound.  Musculoskeletal:        General: Tenderness present.     Cervical back: Normal range of motion and neck supple. No rigidity.  Lymphadenopathy:     Cervical: No cervical adenopathy.  Skin:    Findings: No erythema or rash.  Neurological:     Cranial Nerves: No cranial nerve deficit.     Motor: No abnormal muscle tone.     Coordination: Coordination normal.     Deep Tendon Reflexes: Reflexes normal.  Psychiatric:        Behavior: Behavior normal.        Thought Content: Thought content normal.        Judgment: Judgment normal.   L knee w/pain   Lab Results  Component Value Date   WBC 6.1 02/15/2020   HGB 15.4 (H) 02/15/2020   HCT 45.7 02/15/2020   PLT 369.0 02/15/2020   GLUCOSE 83 04/20/2021   CHOL 184 04/20/2021   TRIG 128.0 04/20/2021   HDL 62.50 04/20/2021   LDLCALC 95 04/20/2021   ALT 16 04/20/2021   AST 15 04/20/2021   NA 139 04/20/2021   K 4.6 04/20/2021   CL 100 04/20/2021   CREATININE 0.73 04/20/2021   BUN 15 04/20/2021   CO2 34 (H) 04/20/2021   TSH 0.51 04/20/2021   INR 1.02 11/28/2014   HGBA1C 6.0 04/20/2021    DG FACET JT INJ L /S SINGLE LEVEL LEFT W/FL/CT  Result Date: 12/10/2020 CLINICAL DATA:  56 year old female with bilateral lower back pain. She presents for bilateral L4-L5 facet joint injection. EXAM: DG FACET JT INJ L OR S SPINE SINGLE LEVEL BILATERAL PROCEDURE: The procedure, risks, benefits, and alternatives were explained to the patient. Questions regarding the procedure were encouraged and answered. The patient understands and consents to the procedure. LEFT L5-S1 FACET INJECTION: A posterior oblique approach was taken to the facet on the left at L4-L5 using a curved 22 gauge spinal needle. Intra-articular positioning was confirmed by  injecting a small amount of Isovue-M 200. No vascular opacification is seen. 40 mg of Depo-Medrol mixed with 1.5 mL 1% lidocaine were instilled into the joint. The injection resulted in concordant pain. The procedure was well-tolerated. RIGHT L5-S1 FACET INJECTION: A posterior oblique approach was taken to the facet on the right  at L4-L5 using a curved 22 gauge spinal needle. Intra-articular positioning was confirmed by injecting a small amount of Isovue-M 200. No vascular opacification is seen. 40 mg of Depo-Medrol mixed with 1.5 mL 1% lidocaine were instilled into the joint. The injection resulted in concordant pain. The procedure was well-tolerated. IMPRESSION: Technically successful bilateral L4-L5 facet injections. Electronically Signed   By: Jacqulynn Cadet M.D.   On: 12/10/2020 10:16   DG FACET JT INJ L /S SINGLE LEVEL RIGHT W/FL/CT  Result Date: 12/10/2020 CLINICAL DATA:  56 year old female with bilateral lower back pain. She presents for bilateral L4-L5 facet joint injection. EXAM: DG FACET JT INJ L OR S SPINE SINGLE LEVEL BILATERAL PROCEDURE: The procedure, risks, benefits, and alternatives were explained to the patient. Questions regarding the procedure were encouraged and answered. The patient understands and consents to the procedure. LEFT L5-S1 FACET INJECTION: A posterior oblique approach was taken to the facet on the left at L4-L5 using a curved 22 gauge spinal needle. Intra-articular positioning was confirmed by injecting a small amount of Isovue-M 200. No vascular opacification is seen. 40 mg of Depo-Medrol mixed with 1.5 mL 1% lidocaine were instilled into the joint. The injection resulted in concordant pain. The procedure was well-tolerated. RIGHT L5-S1 FACET INJECTION: A posterior oblique approach was taken to the facet on the right at L4-L5 using a curved 22 gauge spinal needle. Intra-articular positioning was confirmed by injecting a small amount of Isovue-M 200. No vascular opacification  is seen. 40 mg of Depo-Medrol mixed with 1.5 mL 1% lidocaine were instilled into the joint. The injection resulted in concordant pain. The procedure was well-tolerated. IMPRESSION: Technically successful bilateral L4-L5 facet injections. Electronically Signed   By: Jacqulynn Cadet M.D.   On: 12/10/2020 10:16    Assessment & Plan:   Problem List Items Addressed This Visit     B12 deficiency    On B12 inj q 30 d      CFS (chronic fatigue syndrome)    Start w/wt loss      Class 3 severe obesity with serious comorbidity and body mass index (BMI) of 45.0 to 49.9 in adult ([place])    Start Adipex, Ozempic weekly      Relevant Medications   phentermine (ADIPEX-P) 37.5 MG tablet   Semaglutide,0.25 or 0.5MG /DOS, (OZEMPIC, 0.25 OR 0.5 MG/DOSE,) 2 MG/1.5ML SOPN   Hypothyroidism    Cont on Levothroid      Knee pain, left    S/p a fall at work Jul 7th 2022 - R forearm fx and L knee meniscus tear - Dr Clydene Laming in [place]. Workman's Comp case. Celebrex prn      Prediabetes    Probably worse due to wt gain. Start Ozempic sq. Cont Metformin       Vitamin D deficiency    On Vit D         Meds ordered this encounter  Medications   traMADol (ULTRAM) 50 MG tablet    Sig: Take 1 tablet (50 mg total) by mouth 2 (two) times daily as needed.    Dispense:  180 tablet    Refill:  1   pregabalin (LYRICA) 75 MG capsule    Sig: TAKE 1 CAPSULE TWICE A DAY AS NEEDED    Dispense:  180 capsule    Refill:  1   ALPRAZolam (XANAX) 0.5 MG tablet    Sig: TAKE 3 TABLETS BY MOUTH EVERY DAY AT BEDTIME AS NEEDED    Dispense:  270 tablet  Refill:  1   phentermine (ADIPEX-P) 37.5 MG tablet    Sig: Take 1 tablet (37.5 mg total) by mouth daily before breakfast.    Dispense:  90 tablet    Refill:  0   Semaglutide,0.25 or 0.5MG /DOS, (OZEMPIC, 0.25 OR 0.5 MG/DOSE,) 2 MG/1.5ML SOPN    Sig: Inject 0.5 mg into the skin once a week.    Dispense:  9 mL    Refill:  1      Follow-up: Return in about 3  months (around 11/02/2021) for a follow-up visit.  Walker Kehr, MD

## 2021-08-05 NOTE — Assessment & Plan Note (Signed)
S/p a fall at work Jul 7th 2022 - R forearm fx and L knee meniscus tear - Dr Lucretia Roers in [place]. Workman's Comp case. Celebrex prn

## 2021-08-11 ENCOUNTER — Other Ambulatory Visit: Payer: Self-pay

## 2021-08-11 ENCOUNTER — Ambulatory Visit
Admission: RE | Admit: 2021-08-11 | Discharge: 2021-08-11 | Disposition: A | Discharge status: Home / Self Care | Payer: Managed Care, Other (non HMO) | Source: Ambulatory Visit | Attending: Family Medicine | Admitting: Family Medicine

## 2021-08-11 ENCOUNTER — Other Ambulatory Visit: Payer: Self-pay | Admitting: Family Medicine

## 2021-08-11 DIAGNOSIS — M5416 Radiculopathy, lumbar region: Secondary | ICD-10-CM

## 2021-08-11 MED ORDER — METHYLPREDNISOLONE ACETATE 40 MG/ML INJ SUSP (RADIOLOG
80.0000 mg | Freq: Once | INTRAMUSCULAR | Status: AC
  Administered 2021-08-11: 80 mg via INTRA_ARTICULAR

## 2021-08-11 MED ORDER — IOPAMIDOL (ISOVUE-M 200) INJECTION 41%
1.0000 mL | Freq: Once | INTRAMUSCULAR | Status: AC
  Administered 2021-08-11: 1 mL via INTRA_ARTICULAR

## 2021-08-11 NOTE — Discharge Instructions (Signed)

## 2021-08-12 ENCOUNTER — Telehealth: Payer: Self-pay

## 2021-08-12 NOTE — Telephone Encounter (Signed)
Pt states [facility] isn't covering phentermine (ADIPEX-P) 37.5 MG tablet but states that the insurance will cover: Amphetamine 5 or 10 mg tab  Methamphetamine 5mg   90 day supply is requested.  Pharmacy: [facility] [place], [place] - [address]  [address] [place] [phone]

## 2021-08-13 NOTE — Telephone Encounter (Signed)
Phentermine is inexpensive.  She can find the cheapest price through [email or web address].  Amphetamine and methamphetamine are not indicated for weight loss.  Thanks

## 2021-08-14 ENCOUNTER — Telehealth: Payer: Self-pay

## 2021-08-14 NOTE — Telephone Encounter (Signed)
I advised pt to get [employer] to help with cost and that Amphetamine and methamphetamine are not indicated for weight loss.  FYI

## 2021-08-14 NOTE — Telephone Encounter (Signed)
Pt is asking for phentermine (ADIPEX-P) 37.5 MG tablet .  90 day supply is requested.   Pharmacy: [facility] [address], [place] - [address]   [address] [phone]

## 2021-08-14 NOTE — Telephone Encounter (Signed)
New Note not needed °

## 2021-08-16 MED ORDER — PHENTERMINE HCL 37.5 MG PO TABS: 37.5000 mg | ORAL_TABLET | Freq: Every day | ORAL | 0 refills | Status: DC

## 2021-08-16 NOTE — Telephone Encounter (Signed)
Okay. Thank you.

## 2021-09-01 ENCOUNTER — Telehealth: Payer: Self-pay | Admitting: Family Medicine

## 2021-09-01 NOTE — Telephone Encounter (Signed)
I received a disability form or disability paperwork.  There are lots of detailed questions.  I would like to schedule either an office visit or a dedicated phone visit to ask you all of these questions so that I can fill this form out correctly to the best of my ability to be the most accurate that I can to make sure that this form is as good as I can make it.  Please schedule either an in person office visit or a phone or video visit with me in the near future.

## 2021-09-01 NOTE — Telephone Encounter (Signed)
Pt called and scheduled a phone visit w/ Dr. Denyse Amass to go over paperwork on 09/08/21.

## 2021-09-07 ENCOUNTER — Telehealth: Payer: Self-pay | Admitting: Internal Medicine

## 2021-09-07 NOTE — Telephone Encounter (Signed)
1.Medication Requested: pregabalin (LYRICA) 75 MG capsule ? ?traMADol (ULTRAM) 50 MG tablet ? ?ALPRAZolam (XANAX) 0.5 MG tablet ? ? ?2. Pharmacy (Name, Street, [place]):  ?[facility] [address], [place] - [address] ? ?3. On Med List: Y ? ?4. Last Visit with PCP: 08-05-2021 ? ?5. Next visit date with PCP: n/a ? ? ?Agent: Please be advised that RX refills may take up to 3 business days. We ask that you follow-up with your pharmacy.  ?

## 2021-09-08 ENCOUNTER — Ambulatory Visit (INDEPENDENT_AMBULATORY_CARE_PROVIDER_SITE_OTHER): Payer: 59 | Admitting: Family Medicine

## 2021-09-08 DIAGNOSIS — G8929 Other chronic pain: Secondary | ICD-10-CM | POA: Diagnosis not present

## 2021-09-08 DIAGNOSIS — M545 Low back pain, unspecified: Secondary | ICD-10-CM

## 2021-09-08 MED ORDER — TRAMADOL HCL 50 MG PO TABS: 50.0000 mg | ORAL_TABLET | Freq: Two times a day (BID) | ORAL | 1 refills | Status: DC | PRN

## 2021-09-08 MED ORDER — ALPRAZOLAM 0.5 MG PO TABS: ORAL_TABLET | ORAL | 1 refills | Status: DC

## 2021-09-08 NOTE — Telephone Encounter (Signed)
Okay.  Thanks.

## 2021-09-08 NOTE — Progress Notes (Signed)
? ? ?Virtual Visit ? ?I connected with   Tiffany Medina  by a phone telemedicine application and verified that I am speaking with the correct person using two identifiers. ?  ?I discussed the limitations of evaluation and management by telemedicine and the availability of in person appointments. The patient expressed understanding and agreed to proceed. ? ?? Location of the provider office ?? Location of the patient Home ?? The names and roles of all persons participating in the visit.  Patient and myself ? ? ?History of Present Illness: ?Tiffany Medina is a 56 y.o. female who would like to discuss her back pain and a disability paperwork form that needed to be completed. ?  ?Observations/Objective: ? ? ?Exam: ?Normal Speech.  ? ? ?Lab and Radiology Results ?EXAM: ?MRI LUMBAR SPINE WITHOUT CONTRAST ?  ?TECHNIQUE: ?Multiplanar, multisequence MR imaging of the lumbar spine was ?performed. No intravenous contrast was administered. ?  ?COMPARISON:  None. ?  ?FINDINGS: ?Segmentation: 5 non rib-bearing lumbar type vertebral bodies are ?present. The lowest fully formed vertebral body is L5. ?  ?Alignment: Slight retrolisthesis is present at T12-L1, L1-2, and ?L2-3. Slight degenerative anterolisthesis is present at L4-5 and ?L5-S1. Left-sided pars defect noted at L5. Levoconvex curvature is ?centered at L2-3. ?  ?Vertebrae: Edematous endplate marrow changes are noted anteriorly at ?T10-11. Chronic fatty endplate marrow changes are worse left than ?right anteriorly at T11-12. Mild chronic fatty endplate changes are ?present anteriorly at L1-2. Marrow signal and vertebral body heights ?are otherwise normal. ?  ?Conus medullaris and cauda equina: Conus extends to the T12-L1 ?level. Conus and cauda equina appear normal. ?  ?Paraspinal and other soft tissues: Limited imaging the abdomen is ?unremarkable. There is no significant adenopathy. No solid organ ?lesions are present. ?  ?Disc levels: ?  ?T12-L1: Mild disc bulging is  present without significant stenosis. ?  ?L1-2: Mild disc bulging and facet hypertrophy is present without ?significant stenosis. ?  ?L2-3: A far right lateral disc protrusion is present. Mild facet ?hypertrophy is noted bilaterally. Moderate right and mild left ?foraminal narrowing is present. ?  ?L3-4: A broad-based disc protrusion is present. Moderate facet ?hypertrophy present on the left. Mild broad-based disc bulging is ?present. Central canal is patent. Mild foraminal narrowing is ?slightly worse on the left. ?  ?L4-5: A broad-based disc protrusion is present scratched at there is ?some uncovering of the disc. Moderate facet hypertrophy is noted. ?Mild subarticular narrowing is present bilaterally. Foramina are ?patent. ?  ?L5-S1: Left L5 pars defect is present. No significant stenosis is ?present. ?  ?IMPRESSION: ?1. Moderate right and mild left foraminal stenosis at L2-3 secondary ?to a far right lateral disc protrusion and bilateral facet ?hypertrophy. ?2. Mild foraminal narrowing bilaterally at L3-4 is slightly worse on ?the left. ?3. Mild subarticular narrowing bilaterally at L4-5 is secondary to a ?broad-based disc protrusion and moderate facet hypertrophy. ?4. Left L5 pars defect without significant stenosis. This likely ?contributes to mild anterolisthesis. ?  ?  ?Electronically Signed ?  By: Marin Roberts M.D. ?  On: 11/30/2020 14:06 ? I, [name], personally (independently) visualized and performed the interpretation of the images attached in this note. ? ? ? ?Assessment and Plan: ?56 y.o. female with chronic back pain. ? ?Chronic back pain ongoing since April 2022. ?Patient has been remain disabled.  At this point she is likely to have permanent disability and I have filled out a detailed disability paperwork on the phone with [name].  ?We carefully  reviewed her history together as well as answer detailed questions about what she can do and what she cannot do.. ? ?PDMP not reviewed this  encounter. ?No orders of the defined types were placed in this encounter. ? ?No orders of the defined types were placed in this encounter. ? ? ?Follow Up Instructions: ? ?  ?I discussed the assessment and treatment plan with the patient. The patient was provided an opportunity to ask questions and all were answered. The patient agreed with the plan and demonstrated an understanding of the instructions. ?  ?The patient was advised to call back or seek an in-person evaluation if the symptoms worsen or if the condition fails to improve as anticipated. ? ?Time: 21 mins discussion ? ? ?

## 2021-09-15 ENCOUNTER — Telehealth: Payer: Self-pay

## 2021-09-15 NOTE — Telephone Encounter (Signed)
Pt is requesting a refill for: ?pregabalin (LYRICA) 75 MG capsule ? ?Pharmacy: ?[place], [place] - [place] ? ?LOV 08/05/21 ? ?I called Maury to verify and she stated that the Rx that was sent on 2/1 was not fill due to the members services expiring on 1/31.  ?

## 2021-09-17 ENCOUNTER — Ambulatory Visit: Payer: Managed Care, Other (non HMO) | Admitting: Nurse Practitioner

## 2021-09-17 MED ORDER — PREGABALIN 75 MG PO CAPS: ORAL_CAPSULE | ORAL | 1 refills | Status: DC

## 2021-09-17 NOTE — Telephone Encounter (Signed)
OK. Thx

## 2021-10-20 DIAGNOSIS — Z01419 Encounter for gynecological examination (general) (routine) without abnormal findings: Secondary | ICD-10-CM | POA: Diagnosis not present

## 2021-10-20 DIAGNOSIS — Z1231 Encounter for screening mammogram for malignant neoplasm of breast: Secondary | ICD-10-CM | POA: Diagnosis not present

## 2021-10-20 DIAGNOSIS — Z6841 Body Mass Index (BMI) 40.0 and over, adult: Secondary | ICD-10-CM | POA: Diagnosis not present

## 2021-10-20 DIAGNOSIS — R69 Illness, unspecified: Secondary | ICD-10-CM | POA: Diagnosis not present

## 2021-10-20 DIAGNOSIS — Z1272 Encounter for screening for malignant neoplasm of vagina: Secondary | ICD-10-CM | POA: Diagnosis not present

## 2021-10-23 ENCOUNTER — Ambulatory Visit (INDEPENDENT_AMBULATORY_CARE_PROVIDER_SITE_OTHER): Payer: 59 | Admitting: Internal Medicine

## 2021-10-23 ENCOUNTER — Encounter: Payer: Self-pay | Admitting: Internal Medicine

## 2021-10-23 DIAGNOSIS — E538 Deficiency of other specified B group vitamins: Secondary | ICD-10-CM | POA: Diagnosis not present

## 2021-10-23 DIAGNOSIS — Z8742 Personal history of other diseases of the female genital tract: Secondary | ICD-10-CM | POA: Insufficient documentation

## 2021-10-23 DIAGNOSIS — M1990 Unspecified osteoarthritis, unspecified site: Secondary | ICD-10-CM | POA: Diagnosis not present

## 2021-10-23 DIAGNOSIS — M5136 Other intervertebral disc degeneration, lumbar region: Secondary | ICD-10-CM | POA: Insufficient documentation

## 2021-10-23 DIAGNOSIS — F439 Reaction to severe stress, unspecified: Secondary | ICD-10-CM

## 2021-10-23 DIAGNOSIS — M51369 Other intervertebral disc degeneration, lumbar region without mention of lumbar back pain or lower extremity pain: Secondary | ICD-10-CM | POA: Insufficient documentation

## 2021-10-23 DIAGNOSIS — K2100 Gastro-esophageal reflux disease with esophagitis, without bleeding: Secondary | ICD-10-CM | POA: Diagnosis not present

## 2021-10-23 DIAGNOSIS — R69 Illness, unspecified: Secondary | ICD-10-CM | POA: Diagnosis not present

## 2021-10-23 DIAGNOSIS — R112 Nausea with vomiting, unspecified: Secondary | ICD-10-CM | POA: Diagnosis not present

## 2021-10-23 DIAGNOSIS — D689 Coagulation defect, unspecified: Secondary | ICD-10-CM | POA: Insufficient documentation

## 2021-10-23 MED ORDER — ONDANSETRON HCL 4 MG PO TABS: 4.0000 mg | ORAL_TABLET | Freq: Three times a day (TID) | ORAL | 0 refills | Status: DC | PRN

## 2021-10-23 MED ORDER — PANTOPRAZOLE SODIUM 40 MG PO TBEC: 40.0000 mg | DELAYED_RELEASE_TABLET | Freq: Two times a day (BID) | ORAL | 11 refills | Status: DC

## 2021-10-23 NOTE — Assessment & Plan Note (Signed)
On B12 

## 2021-10-23 NOTE — Assessment & Plan Note (Signed)
New ?Due to Ozempic ?Reduce Ozempic ?Zofran prn ?

## 2021-10-23 NOTE — Assessment & Plan Note (Signed)
Discussed stress - settled Workman's comp after a fall at work ?Older sister died at 42 ?

## 2021-10-23 NOTE — Progress Notes (Signed)
? ?Subjective:  ?Patient ID: Tiffany Medina, female    DOB: 12-14-1965  Age: 56 y.o. MRN: [id number] ? ?CC: No chief complaint on file. ? ? ?HPI ?[name] presents for GERD - worse ?C/o n/v on Ozempic (lost 29 lbs and wants to continue) and on Phentermine ?C/o knee pain ? ? ?Outpatient Medications Prior to Visit  ?Medication Sig Dispense Refill  ? Albuterol Sulfate (PROAIR RESPICLICK) [id number] (90 Base) MCG/ACT AEPB Inhale 1-2 puffs into the lungs 4 (four) times daily as needed. 3 each 0  ? ALPRAZolam (XANAX) 0.5 MG tablet TAKE 3 TABLETS BY MOUTH EVERY DAY AT BEDTIME AS NEEDED 270 tablet 1  ? Armodafinil 200 MG TABS Take 1 tablet by mouth daily. 90 tablet 3  ? aspirin (GOODSENSE ASPIRIN) 325 MG tablet Take 1 tablet (325 mg total) by mouth daily. 100 tablet 3  ? buPROPion (WELLBUTRIN SR) 200 MG 12 hr tablet Take 1 tablet (200 mg total) by mouth daily. 90 tablet 3  ? calcitRIOL (ROCALTROL) 0.25 MCG capsule Take 1 capsule (0.25 mcg total) by mouth 2 (two) times daily. 180 capsule 3  ? fluconazole (DIFLUCAN) 150 MG tablet 1 tab by mouth every 3 days as needed 2 tablet 1  ? levothyroxine (EUTHYROX) 100 MCG tablet TAKE 1 TABLET BY MOUTH ONCE DAILY BEFORE BREAKFAST 90 tablet 3  ? metFORMIN (GLUCOPHAGE) 500 MG tablet Take 1 tablet by mouth once daily with breakfast 90 tablet 3  ? Semaglutide,0.25 or 0.5MG /DOS, (OZEMPIC, 0.25 OR 0.5 MG/DOSE,) 2 MG/1.5ML SOPN Inject 0.5 mg into the skin once a week. 9 mL 1  ? traMADol (ULTRAM) 50 MG tablet Take 1 tablet (50 mg total) by mouth 2 (two) times daily as needed. 180 tablet 1  ? celecoxib (CELEBREX) 100 MG capsule TAKE 1 CAPSULE TWICE A DAY AS NEEDED 180 capsule 3  ? omeprazole (PRILOSEC) 20 MG capsule Take 1 capsule (20 mg total) by mouth daily. 90 capsule 3  ? phentermine (ADIPEX-P) 37.5 MG tablet Take 1 tablet (37.5 mg total) by mouth daily before breakfast. 90 tablet 0  ? pregabalin (LYRICA) 75 MG capsule TAKE 1 CAPSULE TWICE A DAY AS NEEDED (Patient not taking: Reported on  10/23/2021) 180 capsule 1  ? ?No facility-administered medications prior to visit.  ? ? ?ROS: ?Review of Systems  ?Constitutional:  Positive for fatigue. Negative for activity change, appetite change, chills and unexpected weight change.  ?HENT:  Negative for congestion, mouth sores and sinus pressure.   ?Eyes:  Negative for visual disturbance.  ?Respiratory:  Negative for cough and chest tightness.   ?Gastrointestinal:  Positive for diarrhea, nausea and vomiting. Negative for abdominal pain.  ?Genitourinary:  Negative for difficulty urinating, frequency and vaginal pain.  ?Musculoskeletal:  Positive for arthralgias, back pain and gait problem.  ?Skin:  Negative for pallor and rash.  ?Neurological:  Negative for dizziness, tremors, weakness, numbness and headaches.  ?Psychiatric/Behavioral:  Negative for confusion and sleep disturbance.   ? ?Objective:  ?BP 138/60 (BP Location: Left Arm, Patient Position: Sitting, Cuff Size: Normal)   Pulse 100   Temp 98.2 ?F (36.8 ?C) (Oral)   Ht 5\' 3"  (1.6 m)   Wt 253 lb (114.8 kg)   SpO2 94%   BMI 44.82 kg/m?  ? ?BP Readings from Last 3 Encounters:  ?10/23/21 138/60  ?08/11/21 (!) 149/53  ?08/05/21 (!) 142/86  ? ? ?Wt Readings from Last 3 Encounters:  ?10/23/21 253 lb (114.8 kg)  ?08/05/21 280 lb 3.2 oz (127.1 kg)  ?08/05/21  281 lb 6.4 oz (127.6 kg)  ? ? ?Physical Exam ?Constitutional:   ?   General: She is not in acute distress. ?   Appearance: She is well-developed. She is obese.  ?HENT:  ?   Head: Normocephalic.  ?   Right Ear: External ear normal.  ?   Left Ear: External ear normal.  ?   Nose: Nose normal.  ?Eyes:  ?   General:     ?   Right eye: No discharge.     ?   Left eye: No discharge.  ?   Conjunctiva/sclera: Conjunctivae normal.  ?   Pupils: Pupils are equal, round, and reactive to light.  ?Neck:  ?   Thyroid: No thyromegaly.  ?   Vascular: No JVD.  ?   Trachea: No tracheal deviation.  ?Cardiovascular:  ?   Rate and Rhythm: Normal rate and regular rhythm.  ?    Heart sounds: Normal heart sounds.  ?Pulmonary:  ?   Effort: No respiratory distress.  ?   Breath sounds: No stridor. No wheezing.  ?Abdominal:  ?   General: Bowel sounds are normal. There is no distension.  ?   Palpations: Abdomen is soft. There is no mass.  ?   Tenderness: There is no abdominal tenderness. There is no guarding or rebound.  ?Musculoskeletal:     ?   General: Tenderness present.  ?   Cervical back: Normal range of motion and neck supple. No rigidity.  ?Lymphadenopathy:  ?   Cervical: No cervical adenopathy.  ?Skin: ?   Findings: No erythema or rash.  ?Neurological:  ?   Mental Status: She is oriented to person, place, and time.  ?   Cranial Nerves: No cranial nerve deficit.  ?   Motor: No abnormal muscle tone.  ?   Coordination: Coordination normal.  ?   Gait: Gait abnormal.  ?   Deep Tendon Reflexes: Reflexes normal.  ?Psychiatric:     ?   Behavior: Behavior normal.     ?   Thought Content: Thought content normal.     ?   Judgment: Judgment normal.  ?Antalgic gait ? ?Lab Results  ?Component Value Date  ? WBC 6.1 02/15/2020  ? HGB 15.4 (H) 02/15/2020  ? HCT 45.7 02/15/2020  ? PLT 369.0 02/15/2020  ? GLUCOSE 83 04/20/2021  ? CHOL 184 04/20/2021  ? TRIG 128.0 04/20/2021  ? HDL 62.50 04/20/2021  ? Curtis 95 04/20/2021  ? ALT 16 04/20/2021  ? AST 15 04/20/2021  ? NA 139 04/20/2021  ? K 4.6 04/20/2021  ? CL 100 04/20/2021  ? CREATININE 0.73 04/20/2021  ? BUN 15 04/20/2021  ? CO2 34 (H) 04/20/2021  ? TSH 0.51 04/20/2021  ? INR 1.02 11/28/2014  ? HGBA1C 6.0 04/20/2021  ? ? ?DG FACET JT INJ L /S SINGLE LEVEL LEFT W/FL/CT ? ?Result Date: 08/11/2021 ?CLINICAL DATA:  Lumbosacral spondylosis without. Left greater than right low back and leg pain. 50% relief after bilateral L4-L5 facet injections last June. Repeat injection requested. EXAM: FLUOROSCOPICALLY GUIDED BILATERAL L4-L5 FACET INJECTIONS FLUOROSCOPY TIME: Radiation Exposure Index (as provided by the fluoroscopic device): 11.0 mGy Kerma PROCEDURE: The  procedure, risks, benefits, and alternatives were explained to the patient. Questions regarding the procedure were encouraged and answered. The patient understands and consents to the procedure. LEFT L4-L5 FACET INJECTION: A posterior oblique approach was taken to the facet on the left at L4-L5 using a curved 5 inch 22 gauge spinal needle. Intra-articular/juxta-articular  positioning was confirmed by injecting a small amount of Isovue-M 200. No vascular opacification is seen. 40 mg of Depo-Medrol mixed with 0.5 mL of 0.25% bupivacaine were instilled into the joint. RIGHT L4-L5 FACET INJECTION: A posterior oblique approach was taken to the facet on the right at L4-L5 using a curved 5 inch 22 gauge spinal needle. Intra-articular/juxta-articular positioning was confirmed by injecting a small amount of Isovue-M 200. No vascular opacification is seen. 40 mg of Depo-Medrol mixed with 0.5 mL of 0.25% bupivacaine were instilled into the joint. The procedure was well-tolerated. IMPRESSION: Technically successful bilateral L4-L5 facet injections. Electronically Signed   By: Titus Dubin M.D.   On: 08/11/2021 09:23  ? ?DG FACET JT INJ L /S SINGLE LEVEL RIGHT W/FL/CT ? ?Result Date: 08/11/2021 ?CLINICAL DATA:  Lumbosacral spondylosis without. Left greater than right low back and leg pain. 50% relief after bilateral L4-L5 facet injections last June. Repeat injection requested. EXAM: FLUOROSCOPICALLY GUIDED BILATERAL L4-L5 FACET INJECTIONS FLUOROSCOPY TIME: Radiation Exposure Index (as provided by the fluoroscopic device): 11.0 mGy Kerma PROCEDURE: The procedure, risks, benefits, and alternatives were explained to the patient. Questions regarding the procedure were encouraged and answered. The patient understands and consents to the procedure. LEFT L4-L5 FACET INJECTION: A posterior oblique approach was taken to the facet on the left at L4-L5 using a curved 5 inch 22 gauge spinal needle. Intra-articular/juxta-articular  positioning was confirmed by injecting a small amount of Isovue-M 200. No vascular opacification is seen. 40 mg of Depo-Medrol mixed with 0.5 mL of 0.25% bupivacaine were instilled into the joint. RIGHT L4-L5 FACET INJECTION:

## 2021-10-23 NOTE — Assessment & Plan Note (Signed)
Worse ?Re-start Protonix bid ?

## 2021-10-23 NOTE — Patient Instructions (Signed)
Blue-Emu cream -- use 2-3 times a day ? ?

## 2021-10-23 NOTE — Assessment & Plan Note (Signed)
Blue-Emu cream was recommended to use 2-3 times a day ? ?

## 2021-11-04 ENCOUNTER — Telehealth: Payer: Self-pay | Admitting: *Deleted

## 2021-11-04 DIAGNOSIS — N870 Mild cervical dysplasia: Secondary | ICD-10-CM | POA: Diagnosis not present

## 2021-11-04 DIAGNOSIS — N72 Inflammatory disease of cervix uteri: Secondary | ICD-10-CM | POA: Diagnosis not present

## 2021-11-04 DIAGNOSIS — R87620 Atypical squamous cells of undetermined significance on cytologic smear of vagina (ASC-US): Secondary | ICD-10-CM | POA: Diagnosis not present

## 2021-11-04 DIAGNOSIS — N959 Unspecified menopausal and perimenopausal disorder: Secondary | ICD-10-CM | POA: Diagnosis not present

## 2021-11-04 DIAGNOSIS — N888 Other specified noninflammatory disorders of cervix uteri: Secondary | ICD-10-CM | POA: Diagnosis not present

## 2021-11-04 DIAGNOSIS — N952 Postmenopausal atrophic vaginitis: Secondary | ICD-10-CM | POA: Diagnosis not present

## 2021-11-04 NOTE — Telephone Encounter (Signed)
Pt was on cover-meds need PA for Tramadol 50 mg. Completed PA w/ (Key: [id number]). Rec'd msg [employer] is processing your PA request and will respond shortly with next steps. You are currently using the fastest method to process this prior authorization. Will recheck later for determination.Marland KitchenJohny Chess ?

## 2021-11-05 ENCOUNTER — Telehealth: Payer: Self-pay

## 2021-11-05 NOTE — Telephone Encounter (Signed)
Rec'd determination fax back med was approved from 11/05/2021 to 05/08/2022. Faxed approval to [place].Marland KitchenJohny Chess ?

## 2021-11-05 NOTE — Telephone Encounter (Signed)
PA started. ? ? ?Key: [id number] ?

## 2021-11-23 ENCOUNTER — Telehealth: Payer: Self-pay

## 2021-11-23 DIAGNOSIS — L820 Inflamed seborrheic keratosis: Secondary | ICD-10-CM | POA: Diagnosis not present

## 2021-11-23 DIAGNOSIS — L728 Other follicular cysts of the skin and subcutaneous tissue: Secondary | ICD-10-CM | POA: Diagnosis not present

## 2021-11-23 MED ORDER — PHENTERMINE HCL 37.5 MG PO TABS: 37.5000 mg | ORAL_TABLET | Freq: Every day | ORAL | 0 refills | Status: DC

## 2021-11-23 MED ORDER — OZEMPIC (0.25 OR 0.5 MG/DOSE) 2 MG/1.5ML ~~LOC~~ SOPN: 0.5000 mg | PEN_INJECTOR | SUBCUTANEOUS | 1 refills | Status: DC

## 2021-11-23 NOTE — Telephone Encounter (Signed)
Done. Thx.

## 2021-11-23 NOTE — Telephone Encounter (Signed)
Pt is requesting a refill on: Semaglutide,0.25 or 0.5MG /DOS, (OZEMPIC, 0.25 OR 0.5 MG/DOSE,) 2 MG/1.5ML SOPN phentermine (ADIPEX-P) 37.5 MG tablet (Expired)  Both 30 day supply  Pharmacy: [place], [place] - [place] [id number] ROV 01/25/22

## 2021-11-27 ENCOUNTER — Other Ambulatory Visit: Payer: Self-pay | Admitting: Family Medicine

## 2021-11-27 ENCOUNTER — Ambulatory Visit (INDEPENDENT_AMBULATORY_CARE_PROVIDER_SITE_OTHER): Payer: 59 | Admitting: Family Medicine

## 2021-11-27 ENCOUNTER — Ambulatory Visit (INDEPENDENT_AMBULATORY_CARE_PROVIDER_SITE_OTHER): Payer: 59

## 2021-11-27 VITALS — BP 138/86 | HR 93 | Ht 63.0 in | Wt 255.4 lb

## 2021-11-27 DIAGNOSIS — G8929 Other chronic pain: Secondary | ICD-10-CM

## 2021-11-27 DIAGNOSIS — M5416 Radiculopathy, lumbar region: Secondary | ICD-10-CM

## 2021-11-27 DIAGNOSIS — M545 Low back pain, unspecified: Secondary | ICD-10-CM

## 2021-11-27 MED ORDER — CYCLOBENZAPRINE HCL 10 MG PO TABS: 10.0000 mg | ORAL_TABLET | Freq: Three times a day (TID) | ORAL | 1 refills | Status: DC | PRN

## 2021-11-27 NOTE — Progress Notes (Signed)
I, [name], LAT, ATC acting as a scribe for [name], MD.  Tiffany Medina is a 56 y.o. female who presents to [facility] today for exacerbation of chronic low back pain. Tiffany Medina was last seen for this in March 2023.  The goal of that visit was primarily to discuss disability.  At that point she had had pain ongoing for about a year which was difficult to control in disability paperwork was filled out. She previously has had facet injections bilaterally at L4-L5 which have worked somewhat well in the past.  Most recent facet injections were completed February 2023 (bilateral L4-L5). Since then she reports low-back hurts after standing for 5 mins and doing any ADL's. Pt notes low back has worsened over the last month. Pt c/o "knot" in her piriformis has worsened and enlarged. Pt locates pain to bilat low back. Pt's insurance did not cover the Lyrica.  Radiating pain:  yes- whole L leg LE numbness/tingling: no Treatments tried: IBU, Tylenol  Dx imaging: 11/29/20 L-spine MRI  10/03/20 L-spine XR  Pertinent review of systems: No fevers or chills  Relevant historical information: History of DVT.  COPD.   Exam:  BP 138/86   Pulse 93   Ht 5\' 3"  (1.6 m)   Wt 255 lb 6.4 oz (115.8 kg)   SpO2 99%   BMI 45.24 kg/m  General: Well Developed, well nourished, and in no acute distress.   MSK: L-spine: Nontender midline.  Tender palpation lumbar paraspinal musculature. Decreased lumbar motion. Antalgic gait.    Lab and Radiology Results  X-ray images L-spine obtained today personally and independently interpreted Degenerative changes especially pronounced facet joints L4-L5 and DDD at T11-T12. Await formal radiology review   EXAM: MRI LUMBAR SPINE WITHOUT CONTRAST   TECHNIQUE: Multiplanar, multisequence MR imaging of the lumbar spine was performed. No intravenous contrast was administered.   COMPARISON:  None.   FINDINGS: Segmentation: 5  non rib-bearing lumbar type vertebral bodies are present. The lowest fully formed vertebral body is L5.   Alignment: Slight retrolisthesis is present at T12-L1, L1-2, and L2-3. Slight degenerative anterolisthesis is present at L4-5 and L5-S1. Left-sided pars defect noted at L5. Levoconvex curvature is centered at L2-3.   Vertebrae: Edematous endplate marrow changes are noted anteriorly at T10-11. Chronic fatty endplate marrow changes are worse left than right anteriorly at T11-12. Mild chronic fatty endplate changes are present anteriorly at L1-2. Marrow signal and vertebral body heights are otherwise normal.   Conus medullaris and cauda equina: Conus extends to the T12-L1 level. Conus and cauda equina appear normal.   Paraspinal and other soft tissues: Limited imaging the abdomen is unremarkable. There is no significant adenopathy. No solid organ lesions are present.   Disc levels:   T12-L1: Mild disc bulging is present without significant stenosis.   L1-2: Mild disc bulging and facet hypertrophy is present without significant stenosis.   L2-3: A far right lateral disc protrusion is present. Mild facet hypertrophy is noted bilaterally. Moderate right and mild left foraminal narrowing is present.   L3-4: A broad-based disc protrusion is present. Moderate facet hypertrophy present on the left. Mild broad-based disc bulging is present. Central canal is patent. Mild foraminal narrowing is slightly worse on the left.   L4-5: A broad-based disc protrusion is present scratched at there is some uncovering of the disc. Moderate facet hypertrophy is noted. Mild subarticular narrowing is present bilaterally. Foramina are patent.   L5-S1: Left L5 pars defect  is present. No significant stenosis is present.   IMPRESSION: 1. Moderate right and mild left foraminal stenosis at L2-3 secondary to a far right lateral disc protrusion and bilateral facet hypertrophy. 2. Mild foraminal  narrowing bilaterally at L3-4 is slightly worse on the left. 3. Mild subarticular narrowing bilaterally at L4-5 is secondary to a broad-based disc protrusion and moderate facet hypertrophy. 4. Left L5 pars defect without significant stenosis. This likely contributes to mild anterolisthesis.     Electronically Signed   By: Marin Robertshristopher  Mattern M.D.   On: 11/30/2020 14:06 I, Clementeen GrahamEvan Jackie Medina, personally (independently) visualized and performed the interpretation of the images attached in this note.     Assessment and Plan: 56 y.o. female with severe worsening chronic low back pain. Tiffany BradfordKimberly had about 1 month of good benefit from facet injections at L4-L5 bilaterally in February 2023 and in June 2022. Unfortunately these did not last very long.  Plan to proceed to medial branch block and ablation at bilateral L4-L5. Additionally will refer to aquatic physical therapy as this may be helpful.  Lastly her pain is quite severe.  Unfortunately she is already taking more tramadol than I can prescribe therefore I cannot escalate her pain regimen much myself.  However [facility]  Limited trial of cyclobenzaprine to see if that helps.  Recheck in 1 month.   PDMP not reviewed this encounter. Orders Placed This Encounter  Procedures   DG Lumbar Spine 2-3 Views    Standing Status:   Future    Number of Occurrences:   1    Standing Expiration Date:   12/28/2021    Order Specific Question:   Reason for Exam (SYMPTOM  OR DIAGNOSIS REQUIRED)    Answer:   low back pain    Order Specific Question:   Preferred imaging location?    [facility]    Order Specific Question:   Is patient pregnant?    Answer:   No   DG Facet Jt Neuro Destruct Sing L/S w/Img Guide    Need confirmatory medial branch block, and if responsive, radiofrequency ablation of: L4-5 RFA BILAT L4-5  [employer]  281 LBS PACS (11/29/20) ASA 325MG -PT AWARE TO STOP 3 DAYS *SCREENED* PT  AWARE NO SHOW FEE    Standing Status:   Future    Standing Expiration Date:   12/28/2021    Order Specific Question:   Reason for Exam (SYMPTOM  OR DIAGNOSIS REQUIRED)    Answer:   low back pain    Order Specific Question:   Is the patient pregnant?    Answer:   No    Order Specific Question:   Preferred Imaging Location?    Answer:   [id number]   Ambulatory referral to Physical Therapy    Referral Priority:   Routine    Referral Type:   Physical Medicine    Referral Reason:   Specialty Services Required    Requested Specialty:   Physical Therapy    Number of Visits Requested:   1   Ambulatory referral to Pain Clinic    Referral Priority:   Routine    Referral Type:   Consultation    Referral Reason:   Specialty Services Required    Requested Specialty:   Pain Medicine    Number of Visits Requested:   1   Meds ordered this encounter  Medications   cyclobenzaprine (FLEXERIL) 10 MG tablet  Sig: Take 1 tablet (10 mg total) by mouth 3 (three) times daily as needed.    Dispense:  90 tablet    Refill:  1     Discussed warning signs or symptoms. Please see discharge instructions. Patient expresses understanding.   The above documentation has been reviewed and is accurate and complete [name], M.D.

## 2021-11-27 NOTE — Patient Instructions (Addendum)
Thank you for coming in today.   Please get an Xray today before you leave   [facility]  Please call [facility] at [phone] to schedule your spine injection.    I've referred you to Aquatic Physical Therapy.  Let us know if you don't hear from them in one week.   Recheck in 1 month

## 2021-12-01 NOTE — Progress Notes (Signed)
Lumbar spine x-ray shows mild arthritis changes at L1-L2 and L5-S1

## 2021-12-02 ENCOUNTER — Telehealth: Payer: Self-pay | Admitting: *Deleted

## 2021-12-02 NOTE — Telephone Encounter (Signed)
Pt was on cover-my-meds need PA ON OZEMPIC. Completed w/ (Key: [id number]) rec'd msg stating Your PA request has been sent to [facility]. Will check for determination..Gordan Payment

## 2021-12-03 NOTE — Telephone Encounter (Signed)
Rec'd determination med was DENIED. IT states formulary not on plan. Formulary alternative(s) are Trulicity, Victoza. Requirement: 3 in a class with 3 or more alternatives, 2 in a class with 2 alternatives, or 1 in a class with only 1 alternative. Please refer to your plan documents for a complete list of alternatives.Marland KitchenRaechel Chute

## 2021-12-07 DIAGNOSIS — M1712 Unilateral primary osteoarthritis, left knee: Secondary | ICD-10-CM | POA: Diagnosis not present

## 2021-12-09 ENCOUNTER — Telehealth: Payer: Self-pay | Admitting: Family Medicine

## 2021-12-09 NOTE — Telephone Encounter (Signed)
Peer-to-peer for medial branch block proceeding to ablation is approved. Authorization number: [id number] expiration date June 07, 2022

## 2021-12-10 NOTE — Telephone Encounter (Signed)
Noted thank you

## 2021-12-17 ENCOUNTER — Telehealth: Payer: Self-pay | Admitting: Internal Medicine

## 2021-12-17 NOTE — Telephone Encounter (Signed)
Pt requesting medications be changed to 30 days supply instead of 90 day supply due to insurance.  Pt requested:buPROPion (WELLBUTRIN SR) 200 MG 12 hr tablet  metFORMIN (GLUCOPHAGE) 500 MG tablet Synthroid (no longer on med list)  Armodafinil 200 MG TABS calcitRIOL (ROCALTROL) 0.25 MCG capsule  [facility] [place], [place] - [address] Phone:  [phone]  Fax:  [phone]

## 2021-12-18 ENCOUNTER — Other Ambulatory Visit: Payer: Self-pay

## 2021-12-18 DIAGNOSIS — E559 Vitamin D deficiency, unspecified: Secondary | ICD-10-CM

## 2021-12-18 MED ORDER — CALCITRIOL 0.25 MCG PO CAPS: 0.2500 ug | ORAL_CAPSULE | Freq: Two times a day (BID) | ORAL | 6 refills | Status: DC

## 2021-12-18 MED ORDER — METFORMIN HCL 500 MG PO TABS: ORAL_TABLET | ORAL | 6 refills | Status: DC

## 2021-12-18 MED ORDER — ARMODAFINIL 200 MG PO TABS: 1.0000 | ORAL_TABLET | Freq: Every day | ORAL | 4 refills | Status: DC

## 2021-12-18 MED ORDER — BUPROPION HCL ER (SR) 200 MG PO TB12: 200.0000 mg | ORAL_TABLET | Freq: Every day | ORAL | 6 refills | Status: DC

## 2021-12-21 ENCOUNTER — Telehealth: Payer: Self-pay | Admitting: Family Medicine

## 2021-12-21 ENCOUNTER — Other Ambulatory Visit: Payer: Self-pay

## 2021-12-21 DIAGNOSIS — M5416 Radiculopathy, lumbar region: Secondary | ICD-10-CM | POA: Diagnosis not present

## 2021-12-21 MED ORDER — ARMODAFINIL 200 MG PO TABS: 1.0000 | ORAL_TABLET | Freq: Every day | ORAL | 4 refills | Status: DC

## 2021-12-21 NOTE — Telephone Encounter (Signed)
Patient called stating that her insurance would not cover [facility] so she found a place in [place].  She was them and was disappointed because she was basically told all of the same things that Dr Denyse Amass did. She was told that they did not prescribed any pain medications and only recommended the epidural steroid injections (which she is already scheduled for from Dr Denyse Amass).   She asked if there was anything further that Dr Denyse Amass would recommend.

## 2021-12-21 NOTE — Telephone Encounter (Signed)
Pt called back in and states she also needs  Armodafinil 200 MG TABS.   States she is at pharmacy now.

## 2021-12-22 ENCOUNTER — Telehealth: Payer: Self-pay | Admitting: *Deleted

## 2021-12-22 ENCOUNTER — Other Ambulatory Visit: Payer: Self-pay | Admitting: Internal Medicine

## 2021-12-22 NOTE — Telephone Encounter (Signed)
Pt was on cover-my-meds need PA on Armodanfinil 200mg . Submitted PA w/ (Key: ). Rec'd instant msg med has been approved. Faxed approval to pof..[id number]

## 2021-12-22 NOTE — Telephone Encounter (Signed)
I got their messages to go and was also disappointed.  I think seeing if she can find an alternative pain management location would be helpful.

## 2021-12-25 ENCOUNTER — Inpatient Hospital Stay
Admission: RE | Admit: 2021-12-25 | Discharge: 2021-12-25 | Disposition: A | Discharge status: Home / Self Care | Payer: 59 | Source: Ambulatory Visit | Attending: Family Medicine | Admitting: Family Medicine

## 2021-12-25 ENCOUNTER — Inpatient Hospital Stay: Admission: RE | Admit: 2021-12-25 | Payer: 59 | Source: Ambulatory Visit

## 2021-12-28 ENCOUNTER — Ambulatory Visit: Payer: 59 | Admitting: Family Medicine

## 2021-12-28 VITALS — BP 142/88 | HR 87 | Ht 63.0 in | Wt 254.8 lb

## 2021-12-28 DIAGNOSIS — M545 Low back pain, unspecified: Secondary | ICD-10-CM

## 2021-12-28 DIAGNOSIS — G8929 Other chronic pain: Secondary | ICD-10-CM

## 2021-12-30 ENCOUNTER — Telehealth: Payer: Self-pay | Admitting: Family Medicine

## 2021-12-30 NOTE — Telephone Encounter (Signed)
Pt states that after last visit with Dr. Denyse Amass, he was going to discuss medication changes/additions with Dr. Posey Rea. Pt checking on status of this.

## 2021-12-31 ENCOUNTER — Encounter (HOSPITAL_BASED_OUTPATIENT_CLINIC_OR_DEPARTMENT_OTHER): Payer: Self-pay | Admitting: Physical Therapy

## 2021-12-31 ENCOUNTER — Ambulatory Visit (HOSPITAL_BASED_OUTPATIENT_CLINIC_OR_DEPARTMENT_OTHER): Payer: 59 | Attending: Family Medicine | Admitting: Physical Therapy

## 2021-12-31 DIAGNOSIS — M5416 Radiculopathy, lumbar region: Secondary | ICD-10-CM | POA: Insufficient documentation

## 2021-12-31 DIAGNOSIS — M62830 Muscle spasm of back: Secondary | ICD-10-CM | POA: Diagnosis not present

## 2021-12-31 DIAGNOSIS — R262 Difficulty in walking, not elsewhere classified: Secondary | ICD-10-CM | POA: Insufficient documentation

## 2021-12-31 NOTE — Therapy (Addendum)
OUTPATIENT PHYSICAL THERAPY THORACOLUMBAR EVALUATION  PHYSICAL THERAPY DISCHARGE SUMMARY  Visits from Start of Care: 1  Current functional level related to goals / functional outcomes: unknown   Remaining deficits: all   Education / Equipment: Discussed eval findings, rehab rationale and POC and patient is in agreement    Patient agrees to discharge. Patient goals were not met. Patient is being discharged due to not returning since the last visit.  Patient Name: Tiffany Medina MRN: [id number] DOB:[birth year], 56 y.o., female Today's Date: 12/31/2021   PT End of Session - 12/31/21 1133     Visit Number 1    Number of Visits 8    Date for PT Re-Evaluation 02/25/22    Authorization Type AETNA    PT Start Time 1116    PT Stop Time 1200    PT Time Calculation (min) 44 min    Activity Tolerance Patient limited by pain    Behavior During Therapy [facility] for tasks assessed/performed             Past Medical History:  Diagnosis Date   Ankle pain    Anxiety    Asthma    Back pain    Chest pain    Chronic fatigue syndrome    Constipation    COPD (chronic obstructive pulmonary disease) (HCC)    Depression    Discoid lupus    DVT (deep venous thrombosis) (HCC)    Dyspnea    Family history of breast cancer    Family history of colon cancer    Genetic testing 06/20/2015   Negative genetic testing on the Breast/Ovarian cancer panel.  The Breast/Ovarian gene panel offered by [facility] includes sequencing and rearrangement analysis for the following 20 genes:  ATM, BARD1, BRCA1, BRCA2, BRIP1, CDH1, CHEK2, EPCAM, FANCC, MLH1, MSH2, MSH6, NBN, PALB2, PMS2, PTEN, RAD51C, RAD51D, TP53, and XRCC2.   The report date is June 19, 2015.    GERD (gastroesophageal reflux disease)    Hot flashes    Hypothyroidism    Insomnia    Leg edema    Lupus ([place])    Neuropathy    Obesity    Sciatica    Sleep apnea    Past Surgical History:  Procedure Laterality Date   ABDOMINAL HYSTERECTOMY      APPENDECTOMY     KNEE ARTHROSCOPY W/ MENISCAL REPAIR     TUBAL LIGATION  1987   VEIN LIGATION AND STRIPPING     Patient Active Problem List   Diagnosis Date Noted   Arthritis 10/23/2021   Blood coagulation disorder ([place]) 10/23/2021   Degeneration of lumbar intervertebral disc 10/23/2021   History of abnormal cervical Papanicolaou smear 10/23/2021   Nausea & vomiting 10/23/2021   Knee pain, left 08/05/2021   Sinusitis 07/26/2021   COPD (chronic obstructive pulmonary disease) ([place]) 07/23/2021   Degenerative tear of medial meniscus of left knee 06/05/2021   Fall 04/30/2021   B12 deficiency 04/30/2021   Viral gastroenteritis 09/25/2019   Asthma 04/25/2019   Deep venous thrombosis ([place]) 04/25/2019   Diverticulosis of colon 04/25/2019   Headache 04/25/2019   Anxiety disorder 11/24/2018   Subungual exostosis 09/07/2018   Right foot pain 06/23/2018   Contusion of foot 06/22/2018   History of adenomatous polyp of colon 05/05/2018   Laceration of right index finger without foreign body without damage to nail 10/14/2017   Mouth sores 10/14/2017   Prediabetes 08/08/2017   Fatigue 04/11/2017   Shortness of breath on exertion 04/11/2017   Hyperglycemia  04/11/2017   Class 3 severe obesity with serious comorbidity and body mass index (BMI) of 45.0 to 49.9 in adult ([place]) 04/11/2017   Chest pain, atypical 03/11/2017   Low back pain 03/11/2017   Leg swelling 01/28/2017   Peripheral neuropathy 01/28/2017   Edema 12/03/2016   Chronic venous insufficiency 12/03/2016   Osteopenia 09/24/2016   Artificial menopause state 08/25/2016   Asymptomatic menopausal state 08/25/2016   Stress at home 07/07/2016   Insomnia 01/02/2016   GERD (gastroesophageal reflux disease) 09/01/2015   Acute bronchitis 06/27/2015   Wheezing 06/27/2015   Vitamin D deficiency 06/09/2015   Family history of breast cancer    Cervical adenopathy 04/14/2015   Tachycardia 04/14/2015   Rash and nonspecific skin  eruption 01/22/2015   ADD (attention deficit disorder) 01/22/2015   Restless leg syndrome 12/09/2014   CFS (chronic fatigue syndrome) 12/09/2014   OSA (obstructive sleep apnea) 12/09/2014   Hypothyroidism 12/09/2014   Paresthesia 12/09/2014   Heavy tobacco smoker >10 cigarettes per day 12/09/2014   History of DVT of lower extremity 12/09/2014   Discoid lupus 12/09/2014   Personal history of other venous thrombosis and embolism 12/09/2014    PCP: Plotnikov, Evie Lacks, MD  REFERRING PROVIDER: Gregor Hams, MD  REFERRING DIAG: M54.16 (ICD-10-CM) - Lumbar radiculopathy  Rationale for Evaluation and Treatment Rehabilitation  THERAPY DIAG:  Radiculopathy, lumbar region - Plan: PT plan of care cert/re-cert  Difficulty in walking, not elsewhere classified - Plan: PT plan of care cert/re-cert  Muscle spasm of back - Plan: PT plan of care cert/re-cert  ONSET DATE: 2 yrs ago  SUBJECTIVE:                                                                                                                                                                                           SUBJECTIVE STATEMENT:   Hx of LBP x 2 years.  [place] last year exacerbated pain with fx left elbow and torn meniscus left knee (need replacement). Has had a few facet injections with more planned.  Has been OOW since Jan 23.  Unable to do house work. Once seated numbness with improve after ~1. Pain interrupts sleep. Lost 30 LB recently Nerve block with ablation scheduled next few weeks. PERTINENT HISTORY:  facet injections bilaterally at L4-L5 [place] DM 1 pack/day smoker  PAIN:  Are you having pain? Yes: NPRS scale: 9/10 Pain location: LB with radiation into LLE numb Pain description: achy, severe constant Aggravating factors: Stair climbing, walking -10 minutes Standing >5 mins Relieving factors: heat, rest, ms relaxers. OTC drug Radiates lle  PRECAUTIONS: Fall  WEIGHT BEARING RESTRICTIONS No  FALLS:   Has patient fallen in last 6 months? 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 1. Moderate right and mild left foraminal stenosis at L2-3 secondary to a far right lateral disc protrusion and bilateral facet hypertrophy. 2. Mild foraminal narrowing bilaterally at L3-4 is slightly worse on the left. 3. Mild subarticular narrowing bilaterally at L4-5 is secondary to a broad-based disc protrusion and moderate facet hypertrophy. 4. Left L5 pars defect without significant stenosis. This likely contributes to mild anterolisthesis.  PATIENT SURVEYS:  FOTO 32 with goal of 42 @ 13th visit  SCREENING FOR RED FLAGS: none  COGNITION:  Overall cognitive status: Within functional limits for tasks assessed     SENSATION: WFL  MUSCLE LENGTH:  Hamstrings:Limited due to pain   POSTURE: rounded shoulders, decreased lumbar lordosis, and weight shift left  PALPATION: Tender palpation lumbar paraspinal musculature and right glut  LUMBAR ROM:   Active  A/PROM  eval  Flexion 30d  Extension 5d  Right lateral flexion 20%   Left lateral flexion 25%  Right rotation 25%  Left rotation 30%   (Blank rows = not tested)  LOWER EXTREMITY ROM:      WFL  LOWER EXTREMITY MMT:    MMT Right eval Left eval  Hip flexion 3 3  Hip extension    Hip abduction    Hip adduction    Hip internal rotation    Hip external rotation    Knee flexion 4 4-  Knee extension 5 5  Ankle dorsiflexion 5 5  Ankle plantarflexion 5 5  Ankle inversion    Ankle eversion     (Blank rows = not tested)  LUMBAR SPECIAL TESTS:  Straight leg  raise test: Positive and Slump test: Negative -left @ 20d; right '@40d'   FUNCTIONAL TESTS:  5 times sit to stand: 35 6 minute walk test: 546f  GAIT: Distance walked: 500 ft Assistive device utilized: None Level of assistance: Complete Independence Comments: antalgic with increased left lateral sway/offloading left, decreased right knee flex with swing    TODAY'S TREATMENT  Evaluation Objective testing Pt Edu see below   PATIENT EDUCATION:  Education details: Anatomy of condition; condition management; benefits of aquatic therapy/properties of water Person educated: Patient Education method: Explanation Education comprehension: verbalized understanding   HOME EXERCISE PROGRAM: Pt has existing HEP from previous episodes. Will instruct on aquatic HEP  ASSESSMENT:  CLINICAL IMPRESSION: Patient is a 56y.o. F who was seen today for physical therapy evaluation and treatment for lumbar radiculopathy secondary to stenosis, facet hypertrophy, L4-5 disc protrusion and mild anterolisthesis.  Situation further aggravated with fall 1 year ago injuring right elbow and left knee. Left knee with torn meniscus unrepaired and reportedly needing to be replaced. 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 She does have very high co-pay and coming from >45 mins away. Maybe limiting factors impeding long term benefit of Aquatic therapy.  She is an excellent candidate for this setting as she is unable to tolerate on land setting.  She will benefit from properties of water facilitating and enhancing progression towards goal.  Anticipate improved pain control with pending  procedures.   OBJECTIVE IMPAIRMENTS decreased activity tolerance, decreased endurance, decreased mobility, difficulty walking, decreased ROM, decreased strength, increased muscle spasms, impaired flexibility, impaired sensation, obesity, and pain.   ACTIVITY LIMITATIONS carrying, lifting, bending, standing,  squatting, stairs, transfers, bed mobility, dressing, and caring for others  PARTICIPATION LIMITATIONS: meal prep, laundry, shopping, community activity, occupation, and yard work  PERSONAL Education officer, community, Profession, Time since onset of injury/illness/exacerbation, and 1-2 comorbidities: smoker, obesity  are also affecting patient's functional outcome.   REHAB POTENTIAL: Good  CLINICAL DECISION MAKING: Evolving/moderate complexity  EVALUATION COMPLEXITY: Moderate   GOALS: Goals reviewed with patient? Yes  SHORT TERM GOALS: Target date: 01/28/2022  LBP decreased to < 5/10 with centralization of symptoms from lle to left glut to signify improvements in radicular symptoms Baseline:10 Goal status: INITIAL  2.  Pt will improve on bilateral hip strength by 1 grade or > (tested in supine) Baseline: 3/5 Goal status: INITIAL  3.  Lumbar Flex to improve to 50d. Baseline: 30d Goal status: INITIAL  4.  Therapist will conduct 6MWT and set appropriate goals based on assessment to improve pt activity tolerance and LE endurance Baseline:  Goal status: INITIAL   LONG TERM GOALS: Target date: 02/25/2022  Will meet FOTO goal of 42  Baseline: 32 Goal status: INITIAL  2.  Pt will be able walk through grocery store without increase in pain sx. Baseline: unable Goal status: INITIAL  3.  Pt will have normalization of gait pattern, without offloading left, and symmetrical movement bilat le. Baseline: left lateral sway/offloading left, decreased right knee flex with swing Goal status: INITIAL   PLAN: PT FREQUENCY: 1x/week  PT DURATION: 8 weeks  PLANNED INTERVENTIONS: Therapeutic exercises, Therapeutic activity, Neuromuscular re-education, Balance training, Gait training, Patient/Family education, Joint mobilization, Stair training, Aquatic Therapy, Spinal manipulation, Spinal mobilization, Cryotherapy, Moist heat, Taping, Traction, Manual therapy, and Re-evaluation.  PLAN FOR NEXT  SESSION: introduce aquatics; core stretching and strengthening   Thoma Paulsen (Frankie) Nainoa Woldt MPT 12/31/2021, 1:13 PM   Addend Stanton Kidney Tharon Aquas) Deundre Thong MPT 07/02/22 915am

## 2021-12-31 NOTE — Telephone Encounter (Signed)
I sent him a message but have not heard back yet.  I will send him a message again when I return after next week if he has not replied back.

## 2022-01-01 ENCOUNTER — Ambulatory Visit
Admission: RE | Admit: 2022-01-01 | Discharge: 2022-01-01 | Disposition: A | Discharge status: Home / Self Care | Payer: 59 | Source: Ambulatory Visit | Attending: Family Medicine | Admitting: Family Medicine

## 2022-01-01 ENCOUNTER — Other Ambulatory Visit: Payer: Self-pay | Admitting: Family Medicine

## 2022-01-01 DIAGNOSIS — M5416 Radiculopathy, lumbar region: Secondary | ICD-10-CM

## 2022-01-01 DIAGNOSIS — M47817 Spondylosis without myelopathy or radiculopathy, lumbosacral region: Secondary | ICD-10-CM | POA: Diagnosis not present

## 2022-01-01 NOTE — Telephone Encounter (Signed)
Pt was informed of this by [employer].

## 2022-01-01 NOTE — Discharge Instructions (Addendum)
Medial Branch Block Discharge Instructions ? ?Take over-the-counter and prescription medicines only as told by your health care provider. ? ?Do not drive the day of your procedure ? ?Return to your normal activities as told by your health care provider.  ? ?If injection site is sore you may ice the area for 20 minutes, 2-3 times a day.  ? ?Check your injection site every day for signs of infection. Check for: ?Redness, swelling, or pain. ?Fluid or blood. ?Warmth. ?Pus or a bad smell. ? ?Please contact our office at [phone] if: ?You have a fever or chills. ?You have any signs of infection. ?You develop any numbness or weakness. ? ? ?Thank you for visiting our office.   ? ?YOU MAY RESUME YOUR ASPIRIN ANYTIME AFTER PROCEDURE TODAY  ?

## 2022-01-12 NOTE — Telephone Encounter (Signed)
Pt called again about this message. Please let her know if we have NOT heard from Dr. Posey Rea on this issue so she can contact him directly.

## 2022-01-13 ENCOUNTER — Telehealth (INDEPENDENT_AMBULATORY_CARE_PROVIDER_SITE_OTHER): Payer: 59 | Admitting: Internal Medicine

## 2022-01-13 DIAGNOSIS — K529 Noninfective gastroenteritis and colitis, unspecified: Secondary | ICD-10-CM

## 2022-01-13 MED ORDER — DIPHENOXYLATE-ATROPINE 2.5-0.025 MG PO TABS: 1.0000 | ORAL_TABLET | Freq: Four times a day (QID) | ORAL | 1 refills | Status: DC | PRN

## 2022-01-13 MED ORDER — ONDANSETRON HCL 4 MG PO TABS: 4.0000 mg | ORAL_TABLET | Freq: Three times a day (TID) | ORAL | 1 refills | Status: DC | PRN

## 2022-01-13 MED ORDER — HYDROCODONE-ACETAMINOPHEN 5-325 MG PO TABS: 1.0000 | ORAL_TABLET | Freq: Three times a day (TID) | ORAL | 0 refills | Status: DC | PRN

## 2022-01-13 NOTE — Telephone Encounter (Signed)
PCP wants to discuss with pt at visit scheduled for 7/31. Pt informed of response.

## 2022-01-13 NOTE — Progress Notes (Signed)
Virtual Visit via Video Note  I connected with Tiffany Medina on 02/01/22 at  2:40 PM EDT by a video enabled telemedicine application and verified that I am speaking with the correct person using two identifiers.   I discussed the limitations of evaluation and management by telemedicine and the availability of in person appointments. The patient expressed understanding and agreed to proceed.  I was located at our [facility] office. The patient was at home. There was no one else present in the visit.  No chief complaint on file.    History of Present Illness:  C/o n/v/d C/o LBP - worse  Review of Systems  Constitutional:  Positive for malaise/fatigue. Negative for fever and weight loss.  HENT:  Negative for nosebleeds and sore throat.   Cardiovascular:  Negative for palpitations and leg swelling.  Gastrointestinal:  Positive for diarrhea and nausea.  Genitourinary:  Positive for frequency. Negative for urgency.  Musculoskeletal:  Positive for back pain and joint pain. Negative for falls.  Skin:  Positive for rash.  Neurological:  Negative for weakness.  Endo/Heme/Allergies:  Bruises/bleeds easily.  Psychiatric/Behavioral:  Positive for depression. Negative for suicidal ideas. The patient is nervous/anxious and has insomnia.      Observations/Objective: The patient appears to be in no acute distress, appears tired  Assessment and Plan:  Problem List Items Addressed This Visit     Gastroenteritis    Acute GI illness.  Prescribed Zofran and Lomotil.  Hydrate well.  Call if not better.  Go to ER if worse        Meds ordered this encounter  Medications   diphenoxylate-atropine (LOMOTIL) 2.5-0.025 MG tablet    Sig: Take 1 tablet by mouth 4 (four) times daily as needed for diarrhea or loose stools.    Dispense:  60 tablet    Refill:  1   DISCONTD: HYDROcodone-acetaminophen (NORCO/VICODIN) 5-325 MG tablet    Sig: Take 1 tablet by mouth 3 (three) times daily as needed for  severe pain.    Dispense:  90 tablet    Refill:  0   ondansetron (ZOFRAN) 4 MG tablet    Sig: Take 1 tablet (4 mg total) by mouth every 8 (eight) hours as needed for nausea or vomiting.    Dispense:  20 tablet    Refill:  1     Follow Up Instructions:    I discussed the assessment and treatment plan with the patient. The patient was provided an opportunity to ask questions and all were answered. The patient agreed with the plan and demonstrated an understanding of the instructions.   The patient was advised to call back or seek an in-person evaluation if the symptoms worsen or if the condition fails to improve as anticipated.  I provided face-to-face time during this encounter. We were at different locations.   Sonda Primes, MD

## 2022-01-25 ENCOUNTER — Ambulatory Visit: Payer: 59 | Admitting: Internal Medicine

## 2022-02-01 ENCOUNTER — Ambulatory Visit: Payer: 59 | Admitting: Family Medicine

## 2022-02-01 ENCOUNTER — Ambulatory Visit (INDEPENDENT_AMBULATORY_CARE_PROVIDER_SITE_OTHER): Payer: 59 | Admitting: Internal Medicine

## 2022-02-01 ENCOUNTER — Encounter: Payer: Self-pay | Admitting: Internal Medicine

## 2022-02-01 VITALS — BP 122/78 | HR 88 | Temp 97.8°F | Ht 63.0 in | Wt 254.0 lb

## 2022-02-01 VITALS — BP 122/78 | HR 88 | Ht 63.0 in | Wt 254.0 lb

## 2022-02-01 DIAGNOSIS — Z6841 Body Mass Index (BMI) 40.0 and over, adult: Secondary | ICD-10-CM

## 2022-02-01 DIAGNOSIS — E538 Deficiency of other specified B group vitamins: Secondary | ICD-10-CM

## 2022-02-01 DIAGNOSIS — E038 Other specified hypothyroidism: Secondary | ICD-10-CM

## 2022-02-01 DIAGNOSIS — G8929 Other chronic pain: Secondary | ICD-10-CM

## 2022-02-01 DIAGNOSIS — M5440 Lumbago with sciatica, unspecified side: Secondary | ICD-10-CM

## 2022-02-01 DIAGNOSIS — K529 Noninfective gastroenteritis and colitis, unspecified: Secondary | ICD-10-CM | POA: Insufficient documentation

## 2022-02-01 DIAGNOSIS — E559 Vitamin D deficiency, unspecified: Secondary | ICD-10-CM | POA: Diagnosis not present

## 2022-02-01 DIAGNOSIS — M5416 Radiculopathy, lumbar region: Secondary | ICD-10-CM

## 2022-02-01 LAB — CBC WITH DIFFERENTIAL/PLATELET
Basophils Absolute: 0.1 10*3/uL (ref 0.0–0.1)
Basophils Relative: 1 % (ref 0.0–3.0)
Eosinophils Absolute: 0.2 10*3/uL (ref 0.0–0.7)
Eosinophils Relative: 2.2 % (ref 0.0–5.0)
HCT: 42.9 % (ref 36.0–46.0)
Hemoglobin: 14.5 g/dL (ref 12.0–15.0)
Lymphocytes Relative: 16.7 % (ref 12.0–46.0)
Lymphs Abs: 1.2 10*3/uL (ref 0.7–4.0)
MCHC: 33.7 g/dL (ref 30.0–36.0)
MCV: 94 fl (ref 78.0–100.0)
Monocytes Absolute: 0.5 10*3/uL (ref 0.1–1.0)
Monocytes Relative: 7.3 % (ref 3.0–12.0)
Neutro Abs: 5.1 10*3/uL (ref 1.4–7.7)
Neutrophils Relative %: 72.8 % (ref 43.0–77.0)
Platelets: 350 10*3/uL (ref 150.0–400.0)
RBC: 4.57 Mil/uL (ref 3.87–5.11)
RDW: 13.7 % (ref 11.5–15.5)
WBC: 7 10*3/uL (ref 4.0–10.5)

## 2022-02-01 LAB — COMPREHENSIVE METABOLIC PANEL
ALT: 24 U/L (ref 0–35)
AST: 20 U/L (ref 0–37)
Albumin: 4.2 g/dL (ref 3.5–5.2)
Alkaline Phosphatase: 93 U/L (ref 39–117)
BUN: 18 mg/dL (ref 6–23)
CO2: 29 mEq/L (ref 19–32)
Calcium: 9.5 mg/dL (ref 8.4–10.5)
Chloride: 102 mEq/L (ref 96–112)
Creatinine, Ser: 0.78 mg/dL (ref 0.40–1.20)
GFR: 84.41 mL/min (ref >60.00)
Glucose, Bld: 86 mg/dL (ref 70–99)
Potassium: 4.3 mEq/L (ref 3.5–5.1)
Sodium: 139 mEq/L (ref 135–145)
Total Bilirubin: 0.2 mg/dL (ref 0.2–1.2)
Total Protein: 7.2 g/dL (ref 6.0–8.3)

## 2022-02-01 LAB — TSH: TSH: 0.86 u[IU]/mL (ref 0.35–5.50)

## 2022-02-01 LAB — VITAMIN B12: Vitamin B-12: 266 pg/mL (ref 211–911)

## 2022-02-01 LAB — VITAMIN D 25 HYDROXY (VIT D DEFICIENCY, FRACTURES): VITD: 12.72 ng/mL — ABNORMAL LOW (ref 30.00–100.00)

## 2022-02-01 LAB — HEMOGLOBIN A1C: Hgb A1c MFr Bld: 6 % (ref 4.6–6.5)

## 2022-02-01 MED ORDER — PHENTERMINE HCL 37.5 MG PO TABS: 37.5000 mg | ORAL_TABLET | Freq: Every day | ORAL | 0 refills | Status: DC

## 2022-02-01 MED ORDER — HYDROCODONE-ACETAMINOPHEN 5-325 MG PO TABS: 1.0000 | ORAL_TABLET | Freq: Three times a day (TID) | ORAL | 0 refills | Status: DC | PRN

## 2022-02-01 MED ORDER — CYANOCOBALAMIN 1000 MCG/ML IJ SOLN: 1000.0000 ug | INTRAMUSCULAR | 3 refills | Status: DC

## 2022-02-01 MED ORDER — WEGOVY 0.5 MG/0.5ML ~~LOC~~ SOAJ: 0.5000 mg | SUBCUTANEOUS | 3 refills | Status: DC

## 2022-02-01 NOTE — Assessment & Plan Note (Signed)
On Rocaltrol 

## 2022-02-01 NOTE — Assessment & Plan Note (Signed)
Acute GI illness.  Prescribed Zofran and Lomotil.  Hydrate well.  Call if not better.  Go to ER if worse

## 2022-02-01 NOTE — Assessment & Plan Note (Signed)
On B12 

## 2022-02-01 NOTE — Progress Notes (Signed)
Subjective:  Patient ID: Tiffany Medina, female    DOB: September 14, 1965  Age: 56 y.o. MRN: [id number]  CC: No chief complaint on file.   HPI Tiffany Medina presents for chronic pain, obesity, anxiety, B12 def  Outpatient Medications Prior to Visit  Medication Sig Dispense Refill   Albuterol Sulfate (PROAIR RESPICLICK) 108 (90 Base) MCG/ACT AEPB Inhale 1-2 puffs into the lungs 4 (four) times daily as needed. 3 each 0   ALPRAZolam (XANAX) 0.5 MG tablet TAKE 3 TABLETS BY MOUTH EVERY DAY AT BEDTIME AS NEEDED 270 tablet 1   Armodafinil 200 MG TABS Take 1 tablet by mouth daily. 30 tablet 4   aspirin (GOODSENSE ASPIRIN) 325 MG tablet Take 1 tablet (325 mg total) by mouth daily. 100 tablet 3   buPROPion (WELLBUTRIN SR) 200 MG 12 hr tablet Take 1 tablet (200 mg total) by mouth daily. 30 tablet 6   calcitRIOL (ROCALTROL) 0.25 MCG capsule Take 1 capsule (0.25 mcg total) by mouth 2 (two) times daily. 30 capsule 6   cyclobenzaprine (FLEXERIL) 10 MG tablet Take 1 tablet (10 mg total) by mouth 3 (three) times daily as needed. 90 tablet 1   diphenoxylate-atropine (LOMOTIL) 2.5-0.025 MG tablet Take 1 tablet by mouth 4 (four) times daily as needed for diarrhea or loose stools. 60 tablet 1   levothyroxine (SYNTHROID) 100 MCG tablet TAKE 1 TABLET BY MOUTH ONCE DAILY BEFORE BREAKFAST 90 tablet 0   metFORMIN (GLUCOPHAGE) 500 MG tablet Take 1 tablet by mouth once daily with breakfast 30 tablet 6   ondansetron (ZOFRAN) 4 MG tablet Take 1 tablet (4 mg total) by mouth every 8 (eight) hours as needed for nausea or vomiting. 20 tablet 1   pantoprazole (PROTONIX) 40 MG tablet Take 1 tablet (40 mg total) by mouth 2 (two) times daily. 60 tablet 11   HYDROcodone-acetaminophen (NORCO/VICODIN) 5-325 MG tablet Take 1 tablet by mouth 3 (three) times daily as needed for severe pain. 90 tablet 0   Semaglutide,0.25 or 0.5MG /DOS, (OZEMPIC, 0.25 OR 0.5 MG/DOSE,) 2 MG/1.5ML SOPN Inject 0.5 mg into the skin once a week. 9 mL 1    fluconazole (DIFLUCAN) 150 MG tablet 1 tab by mouth every 3 days as needed 2 tablet 1   phentermine (ADIPEX-P) 37.5 MG tablet Take 1 tablet (37.5 mg total) by mouth daily before breakfast. 90 tablet 0   No facility-administered medications prior to visit.    ROS: Review of Systems  Constitutional:  Positive for fatigue. Negative for activity change, appetite change, chills and unexpected weight change.  HENT:  Negative for congestion, mouth sores and sinus pressure.   Eyes:  Negative for visual disturbance.  Respiratory:  Negative for cough and chest tightness.   Gastrointestinal:  Negative for abdominal pain and nausea.  Genitourinary:  Negative for difficulty urinating, frequency and vaginal pain.  Musculoskeletal:  Positive for arthralgias, back pain and gait problem.  Skin:  Negative for pallor and rash.  Neurological:  Negative for dizziness, tremors, weakness, numbness and headaches.  Psychiatric/Behavioral:  Positive for dysphoric mood and sleep disturbance. Negative for confusion.     Objective:  BP 122/78 (BP Location: Left Arm, Patient Position: Sitting, Cuff Size: Normal)   Pulse 88   Temp 97.8 F (36.6 C) (Oral)   Ht 5\' 3"  (1.6 m)   Wt 254 lb (115.2 kg)   SpO2 96%   BMI 44.99 kg/m   BP Readings from Last 3 Encounters:  02/01/22 122/78  01/01/22 (!) 144/76  12/28/21 (!) 142/88  Wt Readings from Last 3 Encounters:  02/01/22 254 lb (115.2 kg)  12/28/21 254 lb 12.8 oz (115.6 kg)  11/27/21 255 lb 6.4 oz (115.8 kg)    Physical Exam Constitutional:      General: She is not in acute distress.    Appearance: She is well-developed. She is obese.  HENT:     Head: Normocephalic.     Right Ear: External ear normal.     Left Ear: External ear normal.     Nose: Nose normal.  Eyes:     General:        Right eye: No discharge.        Left eye: No discharge.     Conjunctiva/sclera: Conjunctivae normal.     Pupils: Pupils are equal, round, and reactive to light.   Neck:     Thyroid: No thyromegaly.     Vascular: No JVD.     Trachea: No tracheal deviation.  Cardiovascular:     Rate and Rhythm: Normal rate and regular rhythm.     Heart sounds: Normal heart sounds.  Pulmonary:     Effort: No respiratory distress.     Breath sounds: No stridor. No wheezing.  Abdominal:     General: Bowel sounds are normal. There is no distension.     Palpations: Abdomen is soft. There is no mass.     Tenderness: There is no abdominal tenderness. There is no guarding or rebound.  Musculoskeletal:        General: Tenderness present.     Cervical back: Normal range of motion and neck supple. No rigidity.  Lymphadenopathy:     Cervical: No cervical adenopathy.  Skin:    Findings: No erythema or rash.  Neurological:     Mental Status: She is oriented to person, place, and time.     Cranial Nerves: No cranial nerve deficit.     Motor: No abnormal muscle tone.     Coordination: Coordination normal.     Deep Tendon Reflexes: Reflexes normal.  Psychiatric:        Behavior: Behavior normal.        Thought Content: Thought content normal.        Judgment: Judgment normal.   Antalgic gait  Lab Results  Component Value Date   WBC 6.1 02/15/2020   HGB 15.4 (H) 02/15/2020   HCT 45.7 02/15/2020   PLT 369.0 02/15/2020   GLUCOSE 83 04/20/2021   CHOL 184 04/20/2021   TRIG 128.0 04/20/2021   HDL 62.50 04/20/2021   LDLCALC 95 04/20/2021   ALT 16 04/20/2021   AST 15 04/20/2021   NA 139 04/20/2021   K 4.6 04/20/2021   CL 100 04/20/2021   CREATININE 0.73 04/20/2021   BUN 15 04/20/2021   CO2 34 (H) 04/20/2021   TSH 0.51 04/20/2021   INR 1.02 11/28/2014   HGBA1C 6.0 04/20/2021    DG FACET JT INJ L /S SINGLE LEVEL RIGHT W/FL/CT  Addendum Date: 01/04/2022   ADDENDUM REPORT: 01/04/2022 16:48 ADDENDUM: I called the patient at on Monday morning after her procedure last Friday. She only experienced approximately 25% relief in her facet mediated pain since the  procedure. She is therefore not a good candidate for radiofrequency ablation. Electronically Signed   By: Obie DredgeWilliam T Derry M.D.   On: 01/04/2022 16:48   Result Date: 01/04/2022 CLINICAL DATA:  Lumbosacral spondylosis without myelopathy. Left greater than right low back and leg pain with partial response to bilateral L4-L5 facet injections. Medial  branch blocks requested for possible RFA. EXAM: FLUOROSCOPICALLY GUIDED BILATERAL L3 AND L4 MEDIAL BRANCH BLOCKS, IN PREPARATION FOR BILATERAL L4-L5 FACET RADIOFREQUENCY ABLATION FLUOROSCOPY TIME:  Radiation Exposure Index (as provided by the fluoroscopic device): 20.5 mGy Kerma TECHNIQUE: The procedure, risks, benefits, and alternatives were explained to the patient. Questions regarding the procedure were encouraged and answered. The patient understands and consents to the procedure. An appropriate skin entry site was determined fluoroscopically and marked. Site prepped with betadine, draped in usual sterile fashion, and infiltrated locally with 1% lidocaine. BILATERAL L3 MEDIAL BRANCH BLOCKS: A posterior oblique approach was taken to the junction of the superior articular process and transverse process on each side at L4 using 5 inch 22 gauge spinal needles, to lie along the course of the bilateral L3 medial branch nerves. 0.5 mL of 0.5% bupivacaine were injected. BILATERAL L4 MEDIAL BRANCH BLOCKS: A posterior oblique approach was taken to the junction of the superior articular process and transverse process on each side at L5 using 5 inch 22 gauge spinal needles, to lie along the course of the bilateral L4 medial branch nerves. 0.5 mL of 0.5% bupivacaine were injected. The procedure was well-tolerated. IMPRESSION: 1. Technically successful medial branch blocks in preparation for bilateral L4-L5 facet radiofrequency ablation. I will call the patient on Monday morning to assess for durable and sustained pain relief. An addendum will be placed at that time. Electronically  Signed: By: Obie Dredge M.D. On: 01/01/2022 13:58   DG FACET JT INJ L /S SINGLE LEVEL LEFT W/FL/CT  Addendum Date: 01/04/2022   ADDENDUM REPORT: 01/04/2022 16:48 ADDENDUM: I called the patient at on Monday morning after her procedure last Friday. She only experienced approximately 25% relief in her facet mediated pain since the procedure. She is therefore not a good candidate for radiofrequency ablation. Electronically Signed   By: Obie Dredge M.D.   On: 01/04/2022 16:48   Result Date: 01/04/2022 CLINICAL DATA:  Lumbosacral spondylosis without myelopathy. Left greater than right low back and leg pain with partial response to bilateral L4-L5 facet injections. Medial branch blocks requested for possible RFA. EXAM: FLUOROSCOPICALLY GUIDED BILATERAL L3 AND L4 MEDIAL BRANCH BLOCKS, IN PREPARATION FOR BILATERAL L4-L5 FACET RADIOFREQUENCY ABLATION FLUOROSCOPY TIME:  Radiation Exposure Index (as provided by the fluoroscopic device): 20.5 mGy Kerma TECHNIQUE: The procedure, risks, benefits, and alternatives were explained to the patient. Questions regarding the procedure were encouraged and answered. The patient understands and consents to the procedure. An appropriate skin entry site was determined fluoroscopically and marked. Site prepped with betadine, draped in usual sterile fashion, and infiltrated locally with 1% lidocaine. BILATERAL L3 MEDIAL BRANCH BLOCKS: A posterior oblique approach was taken to the junction of the superior articular process and transverse process on each side at L4 using 5 inch 22 gauge spinal needles, to lie along the course of the bilateral L3 medial branch nerves. 0.5 mL of 0.5% bupivacaine were injected. BILATERAL L4 MEDIAL BRANCH BLOCKS: A posterior oblique approach was taken to the junction of the superior articular process and transverse process on each side at L5 using 5 inch 22 gauge spinal needles, to lie along the course of the bilateral L4 medial branch nerves. 0.5 mL of  0.5% bupivacaine were injected. The procedure was well-tolerated. IMPRESSION: 1. Technically successful medial branch blocks in preparation for bilateral L4-L5 facet radiofrequency ablation. I will call the patient on Monday morning to assess for durable and sustained pain relief. An addendum will be placed at that time. Electronically Signed:  By: Obie Dredge M.D. On: 01/01/2022 13:58   DG FACET JT INJ L /S 2ND LEVEL RIGHT W/FL/CT  Addendum Date: 01/04/2022   ADDENDUM REPORT: 01/04/2022 16:48 ADDENDUM: I called the patient at on Monday morning after her procedure last Friday. She only experienced approximately 25% relief in her facet mediated pain since the procedure. She is therefore not a good candidate for radiofrequency ablation. Electronically Signed   By: Obie Dredge M.D.   On: 01/04/2022 16:48   Result Date: 01/04/2022 CLINICAL DATA:  Lumbosacral spondylosis without myelopathy. Left greater than right low back and leg pain with partial response to bilateral L4-L5 facet injections. Medial branch blocks requested for possible RFA. EXAM: FLUOROSCOPICALLY GUIDED BILATERAL L3 AND L4 MEDIAL BRANCH BLOCKS, IN PREPARATION FOR BILATERAL L4-L5 FACET RADIOFREQUENCY ABLATION FLUOROSCOPY TIME:  Radiation Exposure Index (as provided by the fluoroscopic device): 20.5 mGy Kerma TECHNIQUE: The procedure, risks, benefits, and alternatives were explained to the patient. Questions regarding the procedure were encouraged and answered. The patient understands and consents to the procedure. An appropriate skin entry site was determined fluoroscopically and marked. Site prepped with betadine, draped in usual sterile fashion, and infiltrated locally with 1% lidocaine. BILATERAL L3 MEDIAL BRANCH BLOCKS: A posterior oblique approach was taken to the junction of the superior articular process and transverse process on each side at L4 using 5 inch 22 gauge spinal needles, to lie along the course of the bilateral L3 medial  branch nerves. 0.5 mL of 0.5% bupivacaine were injected. BILATERAL L4 MEDIAL BRANCH BLOCKS: A posterior oblique approach was taken to the junction of the superior articular process and transverse process on each side at L5 using 5 inch 22 gauge spinal needles, to lie along the course of the bilateral L4 medial branch nerves. 0.5 mL of 0.5% bupivacaine were injected. The procedure was well-tolerated. IMPRESSION: 1. Technically successful medial branch blocks in preparation for bilateral L4-L5 facet radiofrequency ablation. I will call the patient on Monday morning to assess for durable and sustained pain relief. An addendum will be placed at that time. Electronically Signed: By: Obie Dredge M.D. On: 01/01/2022 13:58   DG FACET JT INJ L /S  2ND LEVEL LEFT W/FL/CT  Addendum Date: 01/04/2022   ADDENDUM REPORT: 01/04/2022 16:48 ADDENDUM: I called the patient at on Monday morning after her procedure last Friday. She only experienced approximately 25% relief in her facet mediated pain since the procedure. She is therefore not a good candidate for radiofrequency ablation. Electronically Signed   By: Obie Dredge M.D.   On: 01/04/2022 16:48   Result Date: 01/04/2022 CLINICAL DATA:  Lumbosacral spondylosis without myelopathy. Left greater than right low back and leg pain with partial response to bilateral L4-L5 facet injections. Medial branch blocks requested for possible RFA. EXAM: FLUOROSCOPICALLY GUIDED BILATERAL L3 AND L4 MEDIAL BRANCH BLOCKS, IN PREPARATION FOR BILATERAL L4-L5 FACET RADIOFREQUENCY ABLATION FLUOROSCOPY TIME:  Radiation Exposure Index (as provided by the fluoroscopic device): 20.5 mGy Kerma TECHNIQUE: The procedure, risks, benefits, and alternatives were explained to the patient. Questions regarding the procedure were encouraged and answered. The patient understands and consents to the procedure. An appropriate skin entry site was determined fluoroscopically and marked. Site prepped with  betadine, draped in usual sterile fashion, and infiltrated locally with 1% lidocaine. BILATERAL L3 MEDIAL BRANCH BLOCKS: A posterior oblique approach was taken to the junction of the superior articular process and transverse process on each side at L4 using 5 inch 22 gauge spinal needles, to lie along the  course of the bilateral L3 medial branch nerves. 0.5 mL of 0.5% bupivacaine were injected. BILATERAL L4 MEDIAL BRANCH BLOCKS: A posterior oblique approach was taken to the junction of the superior articular process and transverse process on each side at L5 using 5 inch 22 gauge spinal needles, to lie along the course of the bilateral L4 medial branch nerves. 0.5 mL of 0.5% bupivacaine were injected. The procedure was well-tolerated. IMPRESSION: 1. Technically successful medial branch blocks in preparation for bilateral L4-L5 facet radiofrequency ablation. I will call the patient on Monday morning to assess for durable and sustained pain relief. An addendum will be placed at that time. Electronically Signed: By: Obie Dredge M.D. On: 01/01/2022 13:58    Assessment & Plan:   Problem List Items Addressed This Visit     B12 deficiency    On B12      Relevant Orders   Vitamin B12   Class 3 severe obesity with serious comorbidity and body mass index (BMI) of 45.0 to 49.9 in adult (HCC)    Wt Readings from Last 3 Encounters:  02/01/22 254 lb (115.2 kg)  12/28/21 254 lb 12.8 oz (115.6 kg)  11/27/21 255 lb 6.4 oz (115.8 kg)  Off Ozempic On Phentermine  Potential benefits of a long term phentermine  use as well as potential risks  and complications were explained to the patient and were aknowledged. Try [phone] if covered       Relevant Medications   Semaglutide-Weight Management (WEGOVY) 0.5 MG/0.5ML SOAJ   phentermine (ADIPEX-P) 37.5 MG tablet   Other Relevant Orders   CBC with Differential/Platelet   Comprehensive metabolic panel   Hemoglobin A1c   TSH   Vitamin B12   VITAMIN D 25  Hydroxy (Vit-D Deficiency, Fractures)   Hypothyroidism - Primary   Relevant Orders   CBC with Differential/Platelet   Comprehensive metabolic panel   TSH   Low back pain    On Norco per Dr Zollie Pee recomendation  Potential benefits of a long term opioids use as well as potential risks (i.e. addiction risk, apnea etc) and complications (i.e. Somnolence, constipation and others) were explained to the patient and were aknowledged.      Relevant Medications   HYDROcodone-acetaminophen (NORCO/VICODIN) 5-325 MG tablet   Vitamin D deficiency    On  Rocaltrol      Relevant Orders   VITAMIN D 25 Hydroxy (Vit-D Deficiency, Fractures)      Meds ordered this encounter  Medications   Semaglutide-Weight Management (WEGOVY) 0.5 MG/0.5ML SOAJ    Sig: Inject 0.5 mg into the skin once a week.    Dispense:  2 mL    Refill:  3   HYDROcodone-acetaminophen (NORCO/VICODIN) 5-325 MG tablet    Sig: Take 1 tablet by mouth 3 (three) times daily as needed for severe pain.    Dispense:  90 tablet    Refill:  0   phentermine (ADIPEX-P) 37.5 MG tablet    Sig: Take 1 tablet (37.5 mg total) by mouth daily before breakfast.    Dispense:  90 tablet    Refill:  0   cyanocobalamin (VITAMIN B12) 1000 MCG/ML injection    Sig: Inject 1 mL (1,000 mcg total) into the muscle every 30 (thirty) days.    Dispense:  3 mL    Refill:  3      Follow-up: Return in about 3 months (around 05/04/2022) for a follow-up visit.  Sonda Primes, MD

## 2022-02-01 NOTE — Patient Instructions (Signed)
Thank you for coming in today.   Please call [facility] at [phone] to schedule your spine injection.   Let me know how the shot goes.   This is a different injection than you have had.

## 2022-02-01 NOTE — Progress Notes (Signed)
I, [name], LAT, ATC acting as a scribe for [name], MD.  Batul Diego is a 56 y.o. female who presents to [facility] at [facility] today for cont'd chronic LBP. She was referred for a B L4-5 medial branch block and ablation that she had on 01/01/22.  She had prior B L4-5 facet injections on 08/11/21.  She was also referred to both aquatic PT and pain management and has been seen by pain management on 12/21/21, but [facility] was not covered by her insurance. She was last seen by Dr. Denyse Amass on 12/28/21 and was advised to proceed to medial branch block and ablation at bilateral L4-L5 which was performed on 6/30. Today, pt reports she was not a candidate for the ablations due to only 25% improvement from the block.      Today, pt reports LBP has changed. Pt locates pain from low back along the midline to the L-side. Pt describes pain as more of a "bone" pain. Radiating pain into bilat legs cont.  She notes the pain predominantly radiates down her left leg to the medial calf and ankle.  Dx imaging: 11/27/21 L-spine XR 11/29/20 L-spine MRI             10/03/20 L-spine XR  Pertinent review of systems: No fevers or chills  Relevant historical information: History of a DVT.  COPD.   Exam:  BP 122/78   Pulse 88   Ht 5\' 3"  (1.6 m)   Wt 254 lb (115.2 kg)   SpO2 96%   BMI 44.99 kg/m  General: Well Developed, well nourished, and in no acute distress.   MSK: L-spine: Nontender midline. Decreased lumbar motion. Lower extremity strength is intact.    Lab and Radiology Results  EXAM: MRI LUMBAR SPINE WITHOUT CONTRAST   TECHNIQUE: Multiplanar, multisequence MR imaging of the lumbar spine was performed. No intravenous contrast was administered.   COMPARISON:  None.   FINDINGS: Segmentation: 5 non rib-bearing lumbar type vertebral bodies are present. The lowest fully formed vertebral body is L5.   Alignment: Slight retrolisthesis is present at T12-L1, L1-2,  and L2-3. Slight degenerative anterolisthesis is present at L4-5 and L5-S1. Left-sided pars defect noted at L5. Levoconvex curvature is centered at L2-3.   Vertebrae: Edematous endplate marrow changes are noted anteriorly at T10-11. Chronic fatty endplate marrow changes are worse left than right anteriorly at T11-12. Mild chronic fatty endplate changes are present anteriorly at L1-2. Marrow signal and vertebral body heights are otherwise normal.   Conus medullaris and cauda equina: Conus extends to the T12-L1 level. Conus and cauda equina appear normal.   Paraspinal and other soft tissues: Limited imaging the abdomen is unremarkable. There is no significant adenopathy. No solid organ lesions are present.   Disc levels:   T12-L1: Mild disc bulging is present without significant stenosis.   L1-2: Mild disc bulging and facet hypertrophy is present without significant stenosis.   L2-3: A far right lateral disc protrusion is present. Mild facet hypertrophy is noted bilaterally. Moderate right and mild left foraminal narrowing is present.   L3-4: A broad-based disc protrusion is present. Moderate facet hypertrophy present on the left. Mild broad-based disc bulging is present. Central canal is patent. Mild foraminal narrowing is slightly worse on the left.   L4-5: A broad-based disc protrusion is present scratched at there is some uncovering of the disc. Moderate facet hypertrophy is noted. Mild subarticular narrowing is present bilaterally. Foramina are patent.   L5-S1:  Left L5 pars defect is present. No significant stenosis is present.   IMPRESSION: 1. Moderate right and mild left foraminal stenosis at L2-3 secondary to a far right lateral disc protrusion and bilateral facet hypertrophy. 2. Mild foraminal narrowing bilaterally at L3-4 is slightly worse on the left. 3. Mild subarticular narrowing bilaterally at L4-5 is secondary to a broad-based disc protrusion and moderate  facet hypertrophy. 4. Left L5 pars defect without significant stenosis. This likely contributes to mild anterolisthesis.     Electronically Signed   By: Marin Roberts M.D.   On: 11/30/2020 14:06 I, [name], personally (independently) visualized and performed the interpretation of the images attached in this note.     Assessment and Plan: 56 y.o. female with chronic low back pain with left lumbar radiculopathy in an L4 dermatomal pattern.  She has had multiple different treatments for her chronic back pain including facet injections and medial branch block and ablation which did not work as well as we would like.  At this time should her pain has changed to a more radicular pain.  She does have back pain which has not changed all that much but also has pain radiating down her left leg at L4.  Plan for epidural steroid injection.  Plan for aquatic physical therapy as already ordered.  She will keep me updated with how she feels after this injection.   PDMP not reviewed this encounter. Orders Placed This Encounter  Procedures   DG INJECT DIAG/THERA/INC NEEDLE/CATH/PLC EPI/LUMB/SAC W/IMG    Standing Status:   Future    Standing Expiration Date:   02/02/2023    Order Specific Question:   Reason for Exam (SYMPTOM  OR DIAGNOSIS REQUIRED)    Answer:   ESI suspect left L4. Level and technique per radiology    Order Specific Question:   Is the patient pregnant?    Answer:   No    Order Specific Question:   Preferred Imaging Location?    Answer:   [id number]    Order Specific Question:   Radiology Contrast Protocol - do NOT remove file path    Answer:   [email or web address]   No orders of the defined types were placed in this encounter.    Discussed warning signs or symptoms. Please see discharge instructions. Patient expresses understanding.   The above documentation has been reviewed and is accurate and complete [name],  M.D.

## 2022-02-01 NOTE — Assessment & Plan Note (Signed)
On Norco per Dr Zollie Pee recomendation  Potential benefits of a long term opioids use as well as potential risks (i.e. addiction risk, apnea etc) and complications (i.e. Somnolence, constipation and others) were explained to the patient and were aknowledged.

## 2022-02-01 NOTE — Assessment & Plan Note (Signed)
Wt Readings from Last 3 Encounters:  02/01/22 254 lb (115.2 kg)  12/28/21 254 lb 12.8 oz (115.6 kg)  11/27/21 255 lb 6.4 oz (115.8 kg)  Off Ozempic On Phentermine  Potential benefits of a long term phentermine  use as well as potential risks  and complications were explained to the patient and were aknowledged. Try Reginal Lutes if covered

## 2022-02-02 ENCOUNTER — Telehealth: Payer: Self-pay | Admitting: *Deleted

## 2022-02-02 NOTE — Telephone Encounter (Signed)
P t was on cover-my-meds need PA on [employer]. Submitted w/ (Key: [id number]) PA sent to [employer] is processing your PA request../lmb

## 2022-02-04 NOTE — Telephone Encounter (Signed)
Rec'd determination PA was DENIED. It states Your request for coverage was denied because your prescription benefit plan does not cover the requested medication. You may also choose to purchase this medicine at your own expense...Raechel Chute

## 2022-02-06 ENCOUNTER — Encounter: Payer: Self-pay | Admitting: Internal Medicine

## 2022-02-09 ENCOUNTER — Ambulatory Visit
Admission: RE | Admit: 2022-02-09 | Discharge: 2022-02-09 | Disposition: A | Discharge status: Home / Self Care | Payer: 59 | Source: Ambulatory Visit | Attending: Family Medicine | Admitting: Family Medicine

## 2022-02-09 DIAGNOSIS — M4727 Other spondylosis with radiculopathy, lumbosacral region: Secondary | ICD-10-CM | POA: Diagnosis not present

## 2022-02-09 DIAGNOSIS — M5416 Radiculopathy, lumbar region: Secondary | ICD-10-CM

## 2022-02-09 MED ORDER — IOPAMIDOL (ISOVUE-M 200) INJECTION 41%
1.0000 mL | Freq: Once | INTRAMUSCULAR | Status: AC
  Administered 2022-02-09: 1 mL via EPIDURAL

## 2022-02-09 MED ORDER — METHYLPREDNISOLONE ACETATE 40 MG/ML INJ SUSP (RADIOLOG
80.0000 mg | Freq: Once | INTRAMUSCULAR | Status: AC
  Administered 2022-02-09: 80 mg via EPIDURAL

## 2022-02-09 NOTE — Discharge Instructions (Signed)

## 2022-02-11 ENCOUNTER — Telehealth: Payer: Self-pay | Admitting: *Deleted

## 2022-02-11 ENCOUNTER — Other Ambulatory Visit: Payer: 59

## 2022-02-11 NOTE — Telephone Encounter (Signed)
Requesting refill on phentermine # 90. Last filled 11/23/21..Raechel Chute

## 2022-02-12 MED ORDER — PHENTERMINE HCL 37.5 MG PO TABS: 37.5000 mg | ORAL_TABLET | Freq: Every day | ORAL | 0 refills | Status: DC

## 2022-02-12 NOTE — Telephone Encounter (Signed)
Okay.  Thanks.

## 2022-03-15 ENCOUNTER — Telehealth: Payer: Self-pay | Admitting: Internal Medicine

## 2022-03-15 MED ORDER — HYDROCODONE-ACETAMINOPHEN 5-325 MG PO TABS: 1.0000 | ORAL_TABLET | Freq: Three times a day (TID) | ORAL | 0 refills | Status: DC | PRN

## 2022-03-15 NOTE — Telephone Encounter (Signed)
Check [place] registry last filled 02/11/2022.Marland KitchenRaechel Chute

## 2022-03-15 NOTE — Telephone Encounter (Signed)
Done. Thanks.

## 2022-03-15 NOTE — Telephone Encounter (Signed)
Patient would like to have her hydrocodone refilled - please send to [facility] in "[place]

## 2022-04-02 ENCOUNTER — Other Ambulatory Visit: Payer: Self-pay | Admitting: Internal Medicine

## 2022-04-06 NOTE — Telephone Encounter (Signed)
Patient called again about her xanax rx.

## 2022-04-08 ENCOUNTER — Other Ambulatory Visit: Payer: Self-pay | Admitting: Nurse Practitioner

## 2022-04-08 DIAGNOSIS — F419 Anxiety disorder, unspecified: Secondary | ICD-10-CM

## 2022-04-08 MED ORDER — ALPRAZOLAM 0.5 MG PO TABS: ORAL_TABLET | ORAL | 0 refills | Status: DC

## 2022-04-08 NOTE — Progress Notes (Signed)
Partial refill of xanax ordered to get patient through weekend in absence of PCP.

## 2022-04-09 DIAGNOSIS — B079 Viral wart, unspecified: Secondary | ICD-10-CM | POA: Diagnosis not present

## 2022-04-09 DIAGNOSIS — L209 Atopic dermatitis, unspecified: Secondary | ICD-10-CM | POA: Diagnosis not present

## 2022-04-09 NOTE — Telephone Encounter (Signed)
I called to advise her that Jeralyn Ruths, NP sent in a partial refill of  12 pills to get her through the weekend until PCP returns in the office.  Pt is still requesting a full month refill to get her through the remainder of the month.  ALPRAZolam (XANAX) 0.5 MG tablet  Pharmacy: [facility] [address] [place] - [place]

## 2022-04-12 ENCOUNTER — Telehealth: Payer: Self-pay | Admitting: Internal Medicine

## 2022-04-12 DIAGNOSIS — F419 Anxiety disorder, unspecified: Secondary | ICD-10-CM

## 2022-04-12 NOTE — Telephone Encounter (Signed)
Patient is requesting the rest of her xanax rx and her hydrocodone rx be sent to [facility] - in [place].

## 2022-04-13 ENCOUNTER — Other Ambulatory Visit: Payer: Self-pay | Admitting: Nurse Practitioner

## 2022-04-13 DIAGNOSIS — F419 Anxiety disorder, unspecified: Secondary | ICD-10-CM

## 2022-04-13 NOTE — Telephone Encounter (Signed)
Patient called back and still has not received her xanax or hydrocodone - please advise.

## 2022-04-14 MED ORDER — HYDROCODONE-ACETAMINOPHEN 5-325 MG PO TABS: 1.0000 | ORAL_TABLET | Freq: Three times a day (TID) | ORAL | 0 refills | Status: DC | PRN

## 2022-04-14 MED ORDER — ALPRAZOLAM 0.5 MG PO TABS: ORAL_TABLET | ORAL | 3 refills | Status: DC

## 2022-04-14 NOTE — Telephone Encounter (Signed)
Okay.  Done.  Thanks 

## 2022-05-03 ENCOUNTER — Ambulatory Visit: Payer: 59 | Admitting: Internal Medicine

## 2022-05-03 ENCOUNTER — Encounter: Payer: Self-pay | Admitting: Internal Medicine

## 2022-05-03 ENCOUNTER — Ambulatory Visit (INDEPENDENT_AMBULATORY_CARE_PROVIDER_SITE_OTHER): Payer: 59

## 2022-05-03 VITALS — BP 138/94 | HR 81 | Temp 98.0°F | Ht 63.0 in | Wt 261.4 lb

## 2022-05-03 DIAGNOSIS — R739 Hyperglycemia, unspecified: Secondary | ICD-10-CM | POA: Diagnosis not present

## 2022-05-03 DIAGNOSIS — G8929 Other chronic pain: Secondary | ICD-10-CM | POA: Diagnosis not present

## 2022-05-03 DIAGNOSIS — K2100 Gastro-esophageal reflux disease with esophagitis, without bleeding: Secondary | ICD-10-CM | POA: Diagnosis not present

## 2022-05-03 DIAGNOSIS — S92512A Displaced fracture of proximal phalanx of left lesser toe(s), initial encounter for closed fracture: Secondary | ICD-10-CM | POA: Diagnosis not present

## 2022-05-03 DIAGNOSIS — M5440 Lumbago with sciatica, unspecified side: Secondary | ICD-10-CM

## 2022-05-03 DIAGNOSIS — F419 Anxiety disorder, unspecified: Secondary | ICD-10-CM

## 2022-05-03 DIAGNOSIS — E538 Deficiency of other specified B group vitamins: Secondary | ICD-10-CM

## 2022-05-03 DIAGNOSIS — E559 Vitamin D deficiency, unspecified: Secondary | ICD-10-CM | POA: Diagnosis not present

## 2022-05-03 DIAGNOSIS — M79672 Pain in left foot: Secondary | ICD-10-CM | POA: Diagnosis not present

## 2022-05-03 DIAGNOSIS — R69 Illness, unspecified: Secondary | ICD-10-CM | POA: Diagnosis not present

## 2022-05-03 MED ORDER — ONDANSETRON HCL 4 MG PO TABS: 4.0000 mg | ORAL_TABLET | Freq: Three times a day (TID) | ORAL | 1 refills | Status: DC | PRN

## 2022-05-03 MED ORDER — PROAIR RESPICLICK 108 (90 BASE) MCG/ACT IN AEPB: 1.0000 | INHALATION_SPRAY | Freq: Four times a day (QID) | RESPIRATORY_TRACT | 1 refills | Status: DC | PRN

## 2022-05-03 MED ORDER — ALPRAZOLAM 0.5 MG PO TABS: ORAL_TABLET | ORAL | 3 refills | Status: DC

## 2022-05-03 MED ORDER — CYCLOBENZAPRINE HCL 10 MG PO TABS: 10.0000 mg | ORAL_TABLET | Freq: Three times a day (TID) | ORAL | 1 refills | Status: DC | PRN

## 2022-05-03 MED ORDER — PANTOPRAZOLE SODIUM 40 MG PO TBEC: 40.0000 mg | DELAYED_RELEASE_TABLET | Freq: Two times a day (BID) | ORAL | 3 refills | Status: DC

## 2022-05-03 MED ORDER — HYDROCODONE-ACETAMINOPHEN 5-325 MG PO TABS
1.0000 | ORAL_TABLET | Freq: Three times a day (TID) | ORAL | 0 refills | Status: DC | PRN
Start: 2022-05-03 — End: 2022-05-12

## 2022-05-03 MED ORDER — CYANOCOBALAMIN 1000 MCG/ML IJ SOLN
1000.0000 ug | INTRAMUSCULAR | 3 refills | Status: DC
Start: 2022-05-03 — End: 2023-04-19

## 2022-05-03 MED ORDER — BUPROPION HCL ER (SR) 200 MG PO TB12: 200.0000 mg | ORAL_TABLET | Freq: Every day | ORAL | 3 refills | Status: DC

## 2022-05-03 MED ORDER — CALCITRIOL 0.5 MCG PO CAPS: 0.5000 ug | ORAL_CAPSULE | Freq: Two times a day (BID) | ORAL | 11 refills | Status: DC

## 2022-05-03 MED ORDER — LEVOTHYROXINE SODIUM 100 MCG PO TABS: ORAL_TABLET | ORAL | 3 refills | Status: DC

## 2022-05-03 MED ORDER — WEGOVY 0.5 MG/0.5ML ~~LOC~~ SOAJ: 0.5000 mg | SUBCUTANEOUS | 3 refills | Status: DC

## 2022-05-03 MED ORDER — PHENTERMINE HCL 37.5 MG PO TABS
37.5000 mg | ORAL_TABLET | Freq: Every day | ORAL | 0 refills | Status: DC
Start: 2022-05-03 — End: 2022-05-12

## 2022-05-03 MED ORDER — ARMODAFINIL 200 MG PO TABS
1.0000 | ORAL_TABLET | Freq: Every day | ORAL | 1 refills | Status: DC
Start: 2022-05-03 — End: 2022-05-12

## 2022-05-03 NOTE — Assessment & Plan Note (Signed)
On Protonix bid 

## 2022-05-03 NOTE — Assessment & Plan Note (Signed)
On Norco per Dr Clovis Riley recommendation 2023  Potential benefits of a long term opioids use as well as potential risks (i.e. addiction risk, apnea etc) and complications (i.e. Somnolence, constipation and others) were explained to the patient and were aknowledged.

## 2022-05-03 NOTE — Progress Notes (Signed)
Subjective:  Patient ID: Tiffany Medina, female    DOB: [birth year]  Age: 56 y.o. MRN: [id number]  CC: Follow-up (3 month f/u- Want to know if she need to increase rocalcitrol & Vitamin B since labs was low) and Foot Injury (Pt states on Sat miss and hit her foot on table think her (L) pinky toe is broken. Want to get xray)   HPI [place] presents for CFS, LBP/OA, anxiety, Vit B12 and Vit D def F/u DM C/o L toes #4-5 - poss broke, hit the table Outpatient Medications Prior to Visit  Medication Sig Dispense Refill   aspirin (GOODSENSE ASPIRIN) 325 MG tablet Take 1 tablet (325 mg total) by mouth daily. 100 tablet 3   diphenoxylate-atropine (LOMOTIL) 2.5-0.025 MG tablet Take 1 tablet by mouth 4 (four) times daily as needed for diarrhea or loose stools. 60 tablet 1   Albuterol Sulfate (PROAIR RESPICLICK) 123XX123 (90 Base) MCG/ACT AEPB Inhale 1-2 puffs into the lungs 4 (four) times daily as needed. 3 each 0   ALPRAZolam (XANAX) 0.5 MG tablet TAKE 3 TABLETS BY MOUTH EVERY DAY AT BEDTIME AS NEEDED 90 tablet 3   Armodafinil 200 MG TABS Take 1 tablet by mouth daily. 30 tablet 4   buPROPion (WELLBUTRIN SR) 200 MG 12 hr tablet Take 1 tablet (200 mg total) by mouth daily. 30 tablet 6   calcitRIOL (ROCALTROL) 0.25 MCG capsule Take 1 capsule (0.25 mcg total) by mouth 2 (two) times daily. 30 capsule 6   cyanocobalamin (VITAMIN B12) 1000 MCG/ML injection Inject 1 mL (1,000 mcg total) into the muscle every 30 (thirty) days. 3 mL 3   cyclobenzaprine (FLEXERIL) 10 MG tablet Take 1 tablet (10 mg total) by mouth 3 (three) times daily as needed. 90 tablet 1   HYDROcodone-acetaminophen (NORCO/VICODIN) 5-325 MG tablet Take 1 tablet by mouth 3 (three) times daily as needed for severe pain. 90 tablet 0   levothyroxine (SYNTHROID) 100 MCG tablet TAKE 1 TABLET BY MOUTH ONCE DAILY BEFORE BREAKFAST 90 tablet 0   ondansetron (ZOFRAN) 4 MG tablet Take 1 tablet (4 mg total) by mouth every 8 (eight) hours as needed for  nausea or vomiting. 20 tablet 1   pantoprazole (PROTONIX) 40 MG tablet Take 1 tablet (40 mg total) by mouth 2 (two) times daily. 60 tablet 11   Semaglutide-Weight Management (WEGOVY) 0.5 MG/0.5ML SOAJ Inject 0.5 mg into the skin once a week. 2 mL 3   metFORMIN (GLUCOPHAGE) 500 MG tablet Take 1 tablet by mouth once daily with breakfast (Patient not taking: Reported on 05/03/2022) 30 tablet 6   phentermine (ADIPEX-P) 37.5 MG tablet Take 1 tablet (37.5 mg total) by mouth daily before breakfast. 90 tablet 0   No facility-administered medications prior to visit.    ROS: Review of Systems  Constitutional:  Negative for activity change, appetite change, chills, fatigue and unexpected weight change.  HENT:  Negative for congestion, mouth sores and sinus pressure.   Eyes:  Negative for visual disturbance.  Respiratory:  Negative for cough and chest tightness.   Gastrointestinal:  Negative for abdominal pain and nausea.  Genitourinary:  Negative for difficulty urinating, frequency and vaginal pain.  Musculoskeletal:  Positive for arthralgias and back pain. Negative for gait problem.  Skin:  Negative for pallor and rash.  Neurological:  Negative for dizziness, tremors, weakness, numbness and headaches.  Psychiatric/Behavioral:  Negative for confusion, sleep disturbance and suicidal ideas. The patient is nervous/anxious.     Objective:  BP (!) 138/94 (BP  Location: Left Arm)   Pulse 81   Temp 98 F (36.7 C) (Oral)   Ht 5\' 3"  (1.6 m)   Wt 261 lb 6.4 oz (118.6 kg)   SpO2 98%   BMI 46.30 kg/m   BP Readings from Last 3 Encounters:  05/03/22 (!) 138/94  02/09/22 (!) 139/96  02/01/22 122/78    Wt Readings from Last 3 Encounters:  05/03/22 261 lb 6.4 oz (118.6 kg)  02/01/22 254 lb (115.2 kg)  02/01/22 254 lb (115.2 kg)    Physical Exam Constitutional:      General: She is not in acute distress.    Appearance: She is well-developed. She is obese.  HENT:     Head: Normocephalic.      Right Ear: External ear normal.     Left Ear: External ear normal.     Nose: Nose normal.  Eyes:     General:        Right eye: No discharge.        Left eye: No discharge.     Conjunctiva/sclera: Conjunctivae normal.     Pupils: Pupils are equal, round, and reactive to light.  Neck:     Thyroid: No thyromegaly.     Vascular: No JVD.     Trachea: No tracheal deviation.  Cardiovascular:     Rate and Rhythm: Normal rate and regular rhythm.     Heart sounds: Normal heart sounds.  Pulmonary:     Effort: No respiratory distress.     Breath sounds: No stridor. No wheezing.  Abdominal:     General: Bowel sounds are normal. There is no distension.     Palpations: Abdomen is soft. There is no mass.     Tenderness: There is no abdominal tenderness. There is no guarding or rebound.  Musculoskeletal:        General: Tenderness present.     Cervical back: Normal range of motion and neck supple. No rigidity.  Lymphadenopathy:     Cervical: No cervical adenopathy.  Skin:    Findings: No erythema or rash.  Neurological:     Mental Status: She is oriented to person, place, and time.     Cranial Nerves: No cranial nerve deficit.     Motor: No abnormal muscle tone.     Coordination: Coordination normal.     Gait: Gait abnormal.     Deep Tendon Reflexes: Reflexes normal.  Psychiatric:        Behavior: Behavior normal.        Thought Content: Thought content normal.        Judgment: Judgment normal.   Antalgic gait L toes #4 and 5 w/pain and bruises    A total time of 45 minutes was spent preparing to see the patient, reviewing tests, x-rays, operative reports and other medical records.  Also, obtaining history and performing comprehensive physical exam.  Additionally, counseling the patient regarding the above listed issues.   Finally, documenting clinical information in the health records, coordination of care, educating the patient.    Lab Results  Component Value Date   WBC 7.0  02/01/2022   HGB 14.5 02/01/2022   HCT 42.9 02/01/2022   PLT 350.0 02/01/2022   GLUCOSE 86 02/01/2022   CHOL 184 04/20/2021   TRIG 128.0 04/20/2021   HDL 62.50 04/20/2021   LDLCALC 95 04/20/2021   ALT 24 02/01/2022   AST 20 02/01/2022   NA 139 02/01/2022   K 4.3 02/01/2022   CL 102 02/01/2022  CREATININE 0.78 02/01/2022   BUN 18 02/01/2022   CO2 29 02/01/2022   TSH 0.86 02/01/2022   INR 1.02 11/28/2014   HGBA1C 6.0 02/01/2022    DG INJECT DIAG/THERA/INC NEEDLE/CATH/PLC EPI/LUMB/SAC W/IMG  Result Date: 02/09/2022 CLINICAL DATA:  Lumbosacral spondylosis without myelopathy with radiculopathy. Left-sided low back pain radiating down the left leg. No significant improvement after L4-L5 facet injections and medial branch blocks. No prior epidural injections or surgery. FLUOROSCOPY: Radiation Exposure Index (as provided by the fluoroscopic device): 5.2 mGy Kerma PROCEDURE: The procedure, risks, benefits, and alternatives were explained to the patient. Questions regarding the procedure were encouraged and answered. The patient understands and consents to the procedure. LUMBAR EPIDURAL INJECTION: An interlaminar approach was performed on the left at L4-L5. The overlying skin was cleansed and anesthetized. A 6 inch 20 gauge epidural needle was advanced using loss-of-resistance technique. DIAGNOSTIC EPIDURAL INJECTION: Injection of Isovue-M 200 shows a good epidural pattern with spread above and below the level of needle placement, primarily on the left. No vascular opacification is seen. THERAPEUTIC EPIDURAL INJECTION: 80 mg of Depo-Medrol mixed with 3 mL of 1% lidocaine were instilled. The procedure was well-tolerated, and the patient was discharged thirty minutes following the injection in good condition. COMPLICATIONS: None immediate. IMPRESSION: Technically successful interlaminar epidural injection on the left at L4-L5. Electronically Signed   By: Obie Dredge M.D.   On: 02/09/2022 12:33     Assessment & Plan:   Problem List Items Addressed This Visit     Anxiety disorder   Relevant Medications   ALPRAZolam (XANAX) 0.5 MG tablet   buPROPion (WELLBUTRIN SR) 200 MG 12 hr tablet   Other Relevant Orders   T4, free   TSH   Vitamin B12   VITAMIN D 25 Hydroxy (Vit-D Deficiency, Fractures)   Comprehensive metabolic panel   Hemoglobin A1c   B12 deficiency    Increase B12 inj to q 14 d      Relevant Orders   Vitamin B12   Foot pain, left    C/o L toes #4-5 - poss broken X ray Tape toes together Firm sole shoes      Relevant Orders   DG Foot Complete Left   GERD (gastroesophageal reflux disease)    On  Protonix bid      Relevant Medications   ondansetron (ZOFRAN) 4 MG tablet   pantoprazole (PROTONIX) 40 MG tablet   Hyperglycemia - Primary   Relevant Orders   T4, free   TSH   Vitamin B12   VITAMIN D 25 Hydroxy (Vit-D Deficiency, Fractures)   Comprehensive metabolic panel   Hemoglobin A1c   Low back pain    On Norco per Dr Zollie Pee recommendation 2023  Potential benefits of a long term opioids use as well as potential risks (i.e. addiction risk, apnea etc) and complications (i.e. Somnolence, constipation and others) were explained to the patient and were aknowledged.      Relevant Medications   cyclobenzaprine (FLEXERIL) 10 MG tablet   HYDROcodone-acetaminophen (NORCO/VICODIN) 5-325 MG tablet   Vitamin D deficiency    Increase Rocaltrol to 0.5 mcg bid (was on 0.25 bid) 2023      Relevant Orders   VITAMIN D 25 Hydroxy (Vit-D Deficiency, Fractures)      Meds ordered this encounter  Medications   calcitRIOL (ROCALTROL) 0.5 MCG capsule    Sig: Take 1 capsule (0.5 mcg total) by mouth in the morning and at bedtime.    Dispense:  60 capsule  Refill:  11   cyanocobalamin (VITAMIN B12) 1000 MCG/ML injection    Sig: Inject 1 mL (1,000 mcg total) into the muscle every 14 (fourteen) days.    Dispense:  6 mL    Refill:  3   Albuterol Sulfate (PROAIR  RESPICLICK) 123XX123 (90 Base) MCG/ACT AEPB    Sig: Inhale 1-2 puffs into the lungs 4 (four) times daily as needed.    Dispense:  3 each    Refill:  1   ALPRAZolam (XANAX) 0.5 MG tablet    Sig: TAKE 3 TABLETS BY MOUTH EVERY DAY AT BEDTIME AS NEEDED    Dispense:  90 tablet    Refill:  3   Armodafinil 200 MG TABS    Sig: Take 1 tablet by mouth daily.    Dispense:  90 tablet    Refill:  1   buPROPion (WELLBUTRIN SR) 200 MG 12 hr tablet    Sig: Take 1 tablet (200 mg total) by mouth daily.    Dispense:  90 tablet    Refill:  3   cyclobenzaprine (FLEXERIL) 10 MG tablet    Sig: Take 1 tablet (10 mg total) by mouth 3 (three) times daily as needed.    Dispense:  90 tablet    Refill:  1   HYDROcodone-acetaminophen (NORCO/VICODIN) 5-325 MG tablet    Sig: Take 1 tablet by mouth 3 (three) times daily as needed for severe pain.    Dispense:  90 tablet    Refill:  0   levothyroxine (SYNTHROID) 100 MCG tablet    Sig: TAKE 1 TABLET BY MOUTH ONCE DAILY BEFORE BREAKFAST    Dispense:  90 tablet    Refill:  3    Keep follow-up appt for future refills   ondansetron (ZOFRAN) 4 MG tablet    Sig: Take 1 tablet (4 mg total) by mouth every 8 (eight) hours as needed for nausea or vomiting.    Dispense:  20 tablet    Refill:  1   pantoprazole (PROTONIX) 40 MG tablet    Sig: Take 1 tablet (40 mg total) by mouth 2 (two) times daily.    Dispense:  180 tablet    Refill:  3   Semaglutide-Weight Management (WEGOVY) 0.5 MG/0.5ML SOAJ    Sig: Inject 0.5 mg into the skin once a week.    Dispense:  2 mL    Refill:  3   phentermine (ADIPEX-P) 37.5 MG tablet    Sig: Take 1 tablet (37.5 mg total) by mouth daily before breakfast.    Dispense:  90 tablet    Refill:  0      Follow-up: Return in about 3 months (around 08/03/2022) for a follow-up visit.  Walker Kehr, MD

## 2022-05-03 NOTE — Assessment & Plan Note (Signed)
C/o L toes #4-5 - poss broken X ray Tape toes together Firm sole shoes

## 2022-05-03 NOTE — Assessment & Plan Note (Signed)
Increase B12 inj to q 14 d

## 2022-05-03 NOTE — Assessment & Plan Note (Signed)
Increase Rocaltrol to 0.5 mcg bid (was on 0.25 bid) 2023

## 2022-05-10 ENCOUNTER — Telehealth: Payer: Self-pay | Admitting: Internal Medicine

## 2022-05-10 DIAGNOSIS — R69 Illness, unspecified: Secondary | ICD-10-CM | POA: Diagnosis not present

## 2022-05-10 DIAGNOSIS — F419 Anxiety disorder, unspecified: Secondary | ICD-10-CM

## 2022-05-10 DIAGNOSIS — N959 Unspecified menopausal and perimenopausal disorder: Secondary | ICD-10-CM | POA: Diagnosis not present

## 2022-05-10 DIAGNOSIS — R87610 Atypical squamous cells of undetermined significance on cytologic smear of cervix (ASC-US): Secondary | ICD-10-CM | POA: Diagnosis not present

## 2022-05-10 NOTE — Telephone Encounter (Signed)
Pt says [facility] does not take her insurance.  Please send RX for:  ARMODAFINIL 200 MG XANAX 0.5 MG HYDROCODONE 5-325 Phentermine 37.5 mg  Preferred Pharmacy:  [facility].  Pt phone: [phone]

## 2022-05-11 NOTE — Telephone Encounter (Signed)
Patient called to follow up on this, She asked if it could please be done today

## 2022-05-12 MED ORDER — HYDROCODONE-ACETAMINOPHEN 5-325 MG PO TABS: 1.0000 | ORAL_TABLET | Freq: Three times a day (TID) | ORAL | 0 refills | Status: DC | PRN

## 2022-05-12 MED ORDER — ARMODAFINIL 200 MG PO TABS
1.0000 | ORAL_TABLET | Freq: Every day | ORAL | 1 refills | Status: DC
Start: 2022-05-12 — End: 2023-01-04

## 2022-05-12 MED ORDER — ALPRAZOLAM 0.5 MG PO TABS: ORAL_TABLET | ORAL | 3 refills | Status: DC

## 2022-05-12 MED ORDER — PHENTERMINE HCL 37.5 MG PO TABS
37.5000 mg | ORAL_TABLET | Freq: Every day | ORAL | 0 refills | Status: DC
Start: 2022-05-12 — End: 2022-08-03

## 2022-05-12 NOTE — Telephone Encounter (Signed)
Okay.  Thanks.

## 2022-05-12 NOTE — Telephone Encounter (Signed)
Pt wants to be sure provider knows the reason for the [facility] is due to her health insurance, [facility] [employer], is not accepted at other pharmacy.

## 2022-05-12 NOTE — Telephone Encounter (Signed)
Patient calling back today to see if this request can be done today.  Send prescriptions requested in below note to:  [facility]  Pt phone [phone]

## 2022-06-15 ENCOUNTER — Telehealth: Payer: Self-pay | Admitting: Internal Medicine

## 2022-06-15 NOTE — Telephone Encounter (Signed)
Caller & Relationship to patient:  Self   Call back number:[phone]   Date of last office visit:  05/03/2022   Date of next office visit:  08/03/2022   Medication(s) to be refilled:Hydrocodon 5.325 - 90 tablets          [facility] [place]

## 2022-06-16 NOTE — Telephone Encounter (Signed)
Patient called again and is requesting her hydrocodone? Please advise

## 2022-06-17 MED ORDER — HYDROCODONE-ACETAMINOPHEN 5-325 MG PO TABS: 1.0000 | ORAL_TABLET | Freq: Three times a day (TID) | ORAL | 0 refills | Status: DC | PRN

## 2022-06-17 NOTE — Telephone Encounter (Signed)
Okay.  Done.  Thanks 

## 2022-07-18 ENCOUNTER — Telehealth: Payer: Self-pay | Admitting: Internal Medicine

## 2022-07-20 MED ORDER — HYDROCODONE-ACETAMINOPHEN 5-325 MG PO TABS
1.0000 | ORAL_TABLET | Freq: Three times a day (TID) | ORAL | 0 refills | Status: DC | PRN
Start: 2022-07-20 — End: 2022-08-03

## 2022-07-21 ENCOUNTER — Telehealth: Payer: Self-pay

## 2022-07-21 ENCOUNTER — Other Ambulatory Visit (HOSPITAL_COMMUNITY): Payer: Self-pay

## 2022-07-21 ENCOUNTER — Telehealth: Payer: Self-pay | Admitting: *Deleted

## 2022-07-21 NOTE — Telephone Encounter (Signed)
Pt states something beside PROAir needs to be called in because her insurance will not pay for it...Tiffany Medina

## 2022-07-21 NOTE — Telephone Encounter (Signed)
Pharmacy Patient Advocate Encounter   Received notification that prior authorization for HYDROcodone-Acetaminophen 5-325MG  tablets is required/requested.   PA submitted on 07/21/22 to (ins) [place] Medicaid via [facility] (fax) [place] [phone]  Status is pending

## 2022-07-21 NOTE — Telephone Encounter (Signed)
Will forward PA to PA team.../lmb

## 2022-07-21 NOTE — Telephone Encounter (Signed)
Patient called and said that a PA has to be done for the hydrocodone and also said that something beside PROAir needs to be called in because her insurance will not pay for it.  Patient's Number:  [phone]

## 2022-07-22 NOTE — Telephone Encounter (Signed)
Pharmacy Patient Advocate Encounter  Received notification from [place] Medicaid that the request for prior authorization for HYDROcodone-Acetaminophen 5-325MG  tablets has been denied due to patient has alternative pharmacy benefits.      Called pharmacy to see if they had different insurance for patient, they only had the [employer] plan.

## 2022-07-23 MED ORDER — ALBUTEROL SULFATE HFA 108 (90 BASE) MCG/ACT IN AERS: 2.0000 | INHALATION_SPRAY | RESPIRATORY_TRACT | 11 refills | Status: DC | PRN

## 2022-07-23 NOTE — Telephone Encounter (Signed)
Notified pt MD sent in ventolin./lmb

## 2022-07-23 NOTE — Telephone Encounter (Signed)
Okay Ventolin.  Thanks

## 2022-07-30 ENCOUNTER — Other Ambulatory Visit (INDEPENDENT_AMBULATORY_CARE_PROVIDER_SITE_OTHER): Payer: BLUE CROSS/BLUE SHIELD

## 2022-07-30 DIAGNOSIS — R739 Hyperglycemia, unspecified: Secondary | ICD-10-CM | POA: Diagnosis not present

## 2022-07-30 DIAGNOSIS — E559 Vitamin D deficiency, unspecified: Secondary | ICD-10-CM

## 2022-07-30 DIAGNOSIS — F419 Anxiety disorder, unspecified: Secondary | ICD-10-CM

## 2022-07-30 DIAGNOSIS — E538 Deficiency of other specified B group vitamins: Secondary | ICD-10-CM

## 2022-07-30 LAB — COMPREHENSIVE METABOLIC PANEL
ALT: 20 U/L (ref 0–35)
AST: 18 U/L (ref 0–37)
Albumin: 4.2 g/dL (ref 3.5–5.2)
Alkaline Phosphatase: 79 U/L (ref 39–117)
BUN: 25 mg/dL — ABNORMAL HIGH (ref 6–23)
CO2: 29 mEq/L (ref 19–32)
Calcium: 9.3 mg/dL (ref 8.4–10.5)
Chloride: 99 mEq/L (ref 96–112)
Creatinine, Ser: 0.91 mg/dL (ref 0.40–1.20)
GFR: 69.91 mL/min (ref >60.00)
Glucose, Bld: 95 mg/dL (ref 70–99)
Potassium: 4.2 mEq/L (ref 3.5–5.1)
Sodium: 137 mEq/L (ref 135–145)
Total Bilirubin: 0.3 mg/dL (ref 0.2–1.2)
Total Protein: 7.2 g/dL (ref 6.0–8.3)

## 2022-07-30 LAB — VITAMIN B12: Vitamin B-12: 543 pg/mL (ref 211–911)

## 2022-07-30 LAB — T4, FREE: Free T4: 2.33 ng/dL — ABNORMAL HIGH (ref 0.60–1.60)

## 2022-07-30 LAB — HEMOGLOBIN A1C: Hgb A1c MFr Bld: 6.1 % (ref 4.6–6.5)

## 2022-07-30 LAB — VITAMIN D 25 HYDROXY (VIT D DEFICIENCY, FRACTURES): VITD: 14.88 ng/mL — ABNORMAL LOW (ref 30.00–100.00)

## 2022-07-30 LAB — TSH: TSH: 1.57 u[IU]/mL (ref 0.35–5.50)

## 2022-08-03 ENCOUNTER — Other Ambulatory Visit: Payer: Self-pay | Admitting: Internal Medicine

## 2022-08-03 ENCOUNTER — Encounter: Payer: Self-pay | Admitting: Internal Medicine

## 2022-08-03 ENCOUNTER — Ambulatory Visit: Payer: Medicaid Other | Admitting: Internal Medicine

## 2022-08-03 VITALS — BP 128/74 | HR 85 | Temp 97.8°F | Ht 63.0 in | Wt 268.0 lb

## 2022-08-03 DIAGNOSIS — M5440 Lumbago with sciatica, unspecified side: Secondary | ICD-10-CM | POA: Diagnosis not present

## 2022-08-03 DIAGNOSIS — G8929 Other chronic pain: Secondary | ICD-10-CM

## 2022-08-03 DIAGNOSIS — E538 Deficiency of other specified B group vitamins: Secondary | ICD-10-CM

## 2022-08-03 DIAGNOSIS — E038 Other specified hypothyroidism: Secondary | ICD-10-CM

## 2022-08-03 DIAGNOSIS — F419 Anxiety disorder, unspecified: Secondary | ICD-10-CM

## 2022-08-03 DIAGNOSIS — Z6841 Body Mass Index (BMI) 40.0 and over, adult: Secondary | ICD-10-CM

## 2022-08-03 MED ORDER — WEGOVY 1 MG/0.5ML ~~LOC~~ SOAJ: 1.0000 mg | SUBCUTANEOUS | 3 refills | Status: DC

## 2022-08-03 MED ORDER — HYDROCODONE-ACETAMINOPHEN 7.5-325 MG PO TABS: 1.0000 | ORAL_TABLET | Freq: Three times a day (TID) | ORAL | 0 refills | Status: DC | PRN

## 2022-08-03 MED ORDER — PHENTERMINE HCL 37.5 MG PO TABS: 37.5000 mg | ORAL_TABLET | Freq: Every day | ORAL | 0 refills | Status: DC

## 2022-08-03 NOTE — Assessment & Plan Note (Signed)
Worse On Phentermine  Potential benefits of a long term phentermine  use as well as potential risks  and complications were explained to the patient and were aknowledged. Try Mancel Parsons again if covered

## 2022-08-03 NOTE — Assessment & Plan Note (Signed)
FT4 was high - reduced Levothroid to 50 mc/d. Check TSH, FT4 in 4 weeks

## 2022-08-03 NOTE — Telephone Encounter (Signed)
Rec'd msg from pharmacy Alternative Requested:NOT COVERED .Marland KitchenJohny Chess

## 2022-08-03 NOTE — Progress Notes (Signed)
Subjective:  Patient ID: Tiffany Medina, female    DOB: 09-Jan-1966  Age: 57 y.o. MRN: [id number]  CC: Follow-up   HPI Tiffany Medina presents for chronic pain - worse, HTN, hypothyroidism  Outpatient Medications Prior to Visit  Medication Sig Dispense Refill   albuterol (PROVENTIL HFA) 108 (90 Base) MCG/ACT inhaler Inhale 2 puffs into the lungs every 4 (four) hours as needed for wheezing or shortness of breath. 8.5 g 11   ALPRAZolam (XANAX) 0.5 MG tablet TAKE 3 TABLETS BY MOUTH EVERY DAY AT BEDTIME AS NEEDED 90 tablet 3   Armodafinil 200 MG TABS Take 1 tablet by mouth daily. 90 tablet 1   aspirin (GOODSENSE ASPIRIN) 325 MG tablet Take 1 tablet (325 mg total) by mouth daily. 100 tablet 3   buPROPion (WELLBUTRIN SR) 200 MG 12 hr tablet Take 1 tablet (200 mg total) by mouth daily. 90 tablet 3   calcitRIOL (ROCALTROL) 0.5 MCG capsule Take 1 capsule (0.5 mcg total) by mouth in the morning and at bedtime. 60 capsule 11   cyanocobalamin (VITAMIN B12) 1000 MCG/ML injection Inject 1 mL (1,000 mcg total) into the muscle every 14 (fourteen) days. 6 mL 3   cyclobenzaprine (FLEXERIL) 10 MG tablet Take 1 tablet (10 mg total) by mouth 3 (three) times daily as needed. 90 tablet 1   diphenoxylate-atropine (LOMOTIL) 2.5-0.025 MG tablet Take 1 tablet by mouth 4 (four) times daily as needed for diarrhea or loose stools. 60 tablet 1   levothyroxine (SYNTHROID) 100 MCG tablet TAKE 1 TABLET BY MOUTH ONCE DAILY BEFORE BREAKFAST 90 tablet 3   ondansetron (ZOFRAN) 4 MG tablet Take 1 tablet (4 mg total) by mouth every 8 (eight) hours as needed for nausea or vomiting. 20 tablet 1   pantoprazole (PROTONIX) 40 MG tablet Take 1 tablet (40 mg total) by mouth 2 (two) times daily. 180 tablet 3   HYDROcodone-acetaminophen (NORCO/VICODIN) 5-325 MG tablet Take 1 tablet by mouth 3 (three) times daily as needed for severe pain. 90 tablet 0   phentermine (ADIPEX-P) 37.5 MG tablet Take 1 tablet (37.5 mg total) by mouth daily  before breakfast. 90 tablet 0   Semaglutide-Weight Management (WEGOVY) 0.5 MG/0.5ML SOAJ Inject 0.5 mg into the skin once a week. (Patient not taking: Reported on 08/03/2022) 2 mL 3   No facility-administered medications prior to visit.    ROS: Review of Systems  Constitutional:  Positive for fatigue and unexpected weight change. Negative for activity change, appetite change and chills.  HENT:  Negative for congestion, mouth sores and sinus pressure.   Eyes:  Negative for visual disturbance.  Respiratory:  Negative for cough and chest tightness.   Gastrointestinal:  Negative for abdominal pain and nausea.  Genitourinary:  Negative for difficulty urinating, frequency and vaginal pain.  Musculoskeletal:  Positive for arthralgias and back pain. Negative for gait problem.  Skin:  Negative for pallor and rash.  Neurological:  Negative for dizziness, tremors, weakness, numbness and headaches.  Hematological:  Does not bruise/bleed easily.  Psychiatric/Behavioral:  Negative for confusion, sleep disturbance and suicidal ideas. The patient is not nervous/anxious.     Objective:  BP 128/74 (BP Location: Left Arm, Patient Position: Sitting, Cuff Size: Normal)   Pulse 85   Temp 97.8 F (36.6 C) (Oral)   Ht 5\' 3"  (1.6 m)   Wt 268 lb (121.6 kg)   SpO2 95%   BMI 47.47 kg/m   BP Readings from Last 3 Encounters:  08/03/22 128/74  05/03/22 (!) 138/94  02/09/22 (!) 139/96    Wt Readings from Last 3 Encounters:  08/03/22 268 lb (121.6 kg)  05/03/22 261 lb 6.4 oz (118.6 kg)  02/01/22 254 lb (115.2 kg)    Physical Exam Constitutional:      General: She is not in acute distress.    Appearance: She is well-developed. She is obese.  HENT:     Head: Normocephalic.     Right Ear: External ear normal.     Left Ear: External ear normal.     Nose: Nose normal.  Eyes:     General:        Right eye: No discharge.        Left eye: No discharge.     Conjunctiva/sclera: Conjunctivae normal.      Pupils: Pupils are equal, round, and reactive to light.  Neck:     Thyroid: No thyromegaly.     Vascular: No JVD.     Trachea: No tracheal deviation.  Cardiovascular:     Rate and Rhythm: Normal rate and regular rhythm.     Heart sounds: Normal heart sounds.  Pulmonary:     Effort: No respiratory distress.     Breath sounds: No stridor. No wheezing.  Abdominal:     General: Bowel sounds are normal. There is no distension.     Palpations: Abdomen is soft. There is no mass.     Tenderness: There is no abdominal tenderness. There is no guarding or rebound.  Musculoskeletal:        General: Tenderness present.     Cervical back: Normal range of motion and neck supple. No rigidity.  Lymphadenopathy:     Cervical: No cervical adenopathy.  Skin:    Findings: No erythema or rash.  Neurological:     Mental Status: She is oriented to person, place, and time.     Cranial Nerves: No cranial nerve deficit.     Motor: No abnormal muscle tone.     Coordination: Coordination normal.     Gait: Gait abnormal.     Deep Tendon Reflexes: Reflexes normal.  Psychiatric:        Behavior: Behavior normal.        Thought Content: Thought content normal.        Judgment: Judgment normal.     Lab Results  Component Value Date   WBC 7.0 02/01/2022   HGB 14.5 02/01/2022   HCT 42.9 02/01/2022   PLT 350.0 02/01/2022   GLUCOSE 95 07/30/2022   CHOL 184 04/20/2021   TRIG 128.0 04/20/2021   HDL 62.50 04/20/2021   LDLCALC 95 04/20/2021   ALT 20 07/30/2022   AST 18 07/30/2022   NA 137 07/30/2022   K 4.2 07/30/2022   CL 99 07/30/2022   CREATININE 0.91 07/30/2022   BUN 25 (H) 07/30/2022   CO2 29 07/30/2022   TSH 1.57 07/30/2022   INR 1.02 11/28/2014   HGBA1C 6.1 07/30/2022    DG INJECT DIAG/THERA/INC NEEDLE/CATH/PLC EPI/LUMB/SAC W/IMG  Result Date: 02/09/2022 CLINICAL DATA:  Lumbosacral spondylosis without myelopathy with radiculopathy. Left-sided low back pain radiating down the left leg. No  significant improvement after L4-L5 facet injections and medial branch blocks. No prior epidural injections or surgery. FLUOROSCOPY: Radiation Exposure Index (as provided by the fluoroscopic device): 5.2 mGy Kerma PROCEDURE: The procedure, risks, benefits, and alternatives were explained to the patient. Questions regarding the procedure were encouraged and answered. The patient understands and consents to the procedure. LUMBAR EPIDURAL INJECTION: An interlaminar approach was  performed on the left at L4-L5. The overlying skin was cleansed and anesthetized. A 6 inch 20 gauge epidural needle was advanced using loss-of-resistance technique. DIAGNOSTIC EPIDURAL INJECTION: Injection of Isovue-M 200 shows a good epidural pattern with spread above and below the level of needle placement, primarily on the left. No vascular opacification is seen. THERAPEUTIC EPIDURAL INJECTION: 80 mg of Depo-Medrol mixed with 3 mL of 1% lidocaine were instilled. The procedure was well-tolerated, and the patient was discharged thirty minutes following the injection in good condition. COMPLICATIONS: None immediate. IMPRESSION: Technically successful interlaminar epidural injection on the left at L4-L5. Electronically Signed   By: Titus Dubin M.D.   On: 02/09/2022 12:33    Assessment & Plan:   Problem List Items Addressed This Visit       Endocrine   Hypothyroidism    FT4 was high - reduced Levothroid to 50 mc/d. Check TSH, FT4 in 4 weeks      Relevant Orders   T4, free   TSH     Other   Low back pain    On Norco per Dr Clovis Riley recommendation 2023 Pain is worse Increase Norco to 7.5/325 mg  Potential benefits of a long term opioids use as well as potential risks (i.e. addiction risk, apnea etc) and complications (i.e. Somnolence, constipation and others) were explained to the patient and were aknowledged. UDS per protocol      Relevant Medications   HYDROcodone-acetaminophen (NORCO) 7.5-325 MG tablet   Other  Relevant Orders   Pain Management Screening Profile (10S)   Class 3 severe obesity with serious comorbidity and body mass index (BMI) of 45.0 to 49.9 in adult [facility])    Worse On Phentermine  Potential benefits of a long term phentermine  use as well as potential risks  and complications were explained to the patient and were aknowledged. Try [phone] again if covered      Relevant Medications   phentermine (ADIPEX-P) 37.5 MG tablet   B12 deficiency - Primary    On B12      Anxiety disorder    Xanax as needed  Potential benefits of a long term benzodiazepines  use as well as potential risks  and complications were explained to the patient and were aknowledged.         Meds ordered this encounter  Medications   phentermine (ADIPEX-P) 37.5 MG tablet    Sig: Take 1 tablet (37.5 mg total) by mouth daily before breakfast.    Dispense:  90 tablet    Refill:  0   DISCONTD: Semaglutide-Weight Management (WEGOVY) 1 MG/0.5ML SOAJ    Sig: Inject 1 mg into the skin once a week.    Dispense:  2 mL    Refill:  3    BMI 46, HTN, OA   HYDROcodone-acetaminophen (NORCO) 7.5-325 MG tablet    Sig: Take 1 tablet by mouth every 8 (eight) hours as needed for severe pain.    Dispense:  90 tablet    Refill:  0      Follow-up: Return in about 2 months (around 10/02/2022) for a follow-up visit.  Walker Kehr, MD

## 2022-08-03 NOTE — Assessment & Plan Note (Signed)
On B12 

## 2022-08-03 NOTE — Assessment & Plan Note (Addendum)
On Norco per Dr Clovis Riley recommendation 2023 Pain is worse Increase Norco to 7.5/325 mg  Potential benefits of a long term opioids use as well as potential risks (i.e. addiction risk, apnea etc) and complications (i.e. Somnolence, constipation and others) were explained to the patient and were aknowledged. UDS per protocol

## 2022-08-05 ENCOUNTER — Telehealth: Payer: Self-pay | Admitting: Internal Medicine

## 2022-08-05 NOTE — Telephone Encounter (Signed)
Patient called and stated that her insurance does not cover the medication Semaglutide-Weight Management (WEGOVY) 1 MG/0.5ML SOAJ . She stated that she called her pharmacy and they will cover Ozempic for a 3 mnths supply. Patient is requesting Ozempic be called into her pharmacy on file [id number] - [place], [address]. Patient also stated that this medication will require a Prior Authorization. Best callback number for patient is [phone].

## 2022-08-09 NOTE — Telephone Encounter (Signed)
Patient called again about having Ozempic prescribed. She would [facility] sen to [facility] in [place].

## 2022-08-09 NOTE — Telephone Encounter (Signed)
Insurance will not cover Wegovy, but will on the [place]. Pls send to CV.Marland KitchenJohny Chess

## 2022-08-09 NOTE — Telephone Encounter (Signed)
Ozempic is covered for the diagnosis of diabetes only.  Tiffany Medina does not have diabetes.  Thanks

## 2022-08-10 NOTE — Telephone Encounter (Signed)
Patient said that her insurance will cover:  Trulicity or Victoza but they will require prior authorization.  Please let patient know.

## 2022-08-10 NOTE — Telephone Encounter (Signed)
Called pt no answer LMOM w/MD response../lmb 

## 2022-08-11 MED ORDER — OZEMPIC (0.25 OR 0.5 MG/DOSE) 2 MG/3ML ~~LOC~~ SOPN: PEN_INJECTOR | SUBCUTANEOUS | 1 refills | Status: DC

## 2022-08-11 NOTE — Telephone Encounter (Signed)
Okay to do prior examination on Ozempic.  Diagnosis prediabetes, asthma, low back pain.  Thanks

## 2022-08-11 NOTE — Telephone Encounter (Signed)
Resubmitted PA for Ozempic w/ [id number]. It states has sent to plan.Marland KitchenJohny Chess

## 2022-08-11 NOTE — Addendum Note (Signed)
Addended by: Cassandria Anger on: 08/11/2022 07:31 AM   Modules accepted: Orders

## 2022-08-16 NOTE — Telephone Encounter (Signed)
Rec;d determination back it states Our records indicate Tiffany Medina has alternative pharmacy benefits. Please instruct the pharmacy to bill the patient primary insurance first. If the request medication is not covered or denied by your primary insurance an explanation of benefits or proof denial is required to coverage [place]. Sent Pt status of PA./lmb

## 2022-09-03 ENCOUNTER — Telehealth: Payer: Self-pay

## 2022-09-03 DIAGNOSIS — M1990 Unspecified osteoarthritis, unspecified site: Secondary | ICD-10-CM

## 2022-09-03 NOTE — Telephone Encounter (Signed)
Pt called asking for a refill of her HYDROcodone-acetaminophen (NORCO) 7.5-325 MG tablet.  LOV: 08/03/2022.

## 2022-09-06 NOTE — Telephone Encounter (Signed)
I do not see any recent UDS. This is appropriate I have ordered. Once collected route back to me and we can fill short term. Is she out of medication? It looks like she filled 2 hydrocodone prescriptions in January (07/23/22 and 08/03/22) and should still have medication left.

## 2022-09-06 NOTE — Addendum Note (Signed)
Addended by: Pricilla Holm A on: 09/06/2022 05:01 PM   Modules accepted: Orders

## 2022-09-07 NOTE — Telephone Encounter (Signed)
I was able to speak with the pt and inform her that she is needing to complete a UDS before her next rx refil for her  . Pt also did state she is not out of her medication yet that she still has a few days left. I reiterated to the pt she is needing to complete her UDS before she can get another refill of the medication mentioned above. Pt stated she understands and has no questions or concerns at this time.

## 2022-09-12 ENCOUNTER — Encounter: Payer: Self-pay | Admitting: Internal Medicine

## 2022-09-12 NOTE — Assessment & Plan Note (Signed)
Xanax as needed  Potential benefits of a long term benzodiazepines  use as well as potential risks  and complications were explained to the patient and were aknowledged. 

## 2022-09-22 ENCOUNTER — Ambulatory Visit (INDEPENDENT_AMBULATORY_CARE_PROVIDER_SITE_OTHER): Payer: Medicaid Other

## 2022-09-22 ENCOUNTER — Ambulatory Visit (INDEPENDENT_AMBULATORY_CARE_PROVIDER_SITE_OTHER): Payer: Medicaid Other | Admitting: Sports Medicine

## 2022-09-22 VITALS — HR 90 | Ht 63.0 in | Wt 268.0 lb

## 2022-09-22 DIAGNOSIS — G8929 Other chronic pain: Secondary | ICD-10-CM

## 2022-09-22 DIAGNOSIS — M25522 Pain in left elbow: Secondary | ICD-10-CM

## 2022-09-22 DIAGNOSIS — M25521 Pain in right elbow: Secondary | ICD-10-CM

## 2022-09-22 DIAGNOSIS — M5416 Radiculopathy, lumbar region: Secondary | ICD-10-CM | POA: Diagnosis not present

## 2022-09-22 DIAGNOSIS — M25531 Pain in right wrist: Secondary | ICD-10-CM | POA: Diagnosis not present

## 2022-09-22 DIAGNOSIS — M25532 Pain in left wrist: Secondary | ICD-10-CM

## 2022-09-22 DIAGNOSIS — M546 Pain in thoracic spine: Secondary | ICD-10-CM

## 2022-09-22 MED ORDER — MELOXICAM 15 MG PO TABS: 15.0000 mg | ORAL_TABLET | Freq: Every day | ORAL | 0 refills | Status: DC

## 2022-09-22 NOTE — Patient Instructions (Addendum)
Good to see you  - Start meloxicam 15 mg daily x2 weeks.  If still having pain after 2 weeks, complete 3rd-week of meloxicam. May use remaining meloxicam as needed once daily for pain control.  Do not to use additional NSAIDs while taking meloxicam.  May use Tylenol [phone] mg 2 to 3 times a day for breakthrough pain. Use hydrocodone as needed for pain  Use flexeril nightly 10 mg for breakthrough pain  May continue water therapy 3 week follow up

## 2022-09-22 NOTE — Progress Notes (Signed)
Benito Mccreedy D.[facility] Phone: [phone]   Assessment and Plan:     1. Left wrist pain 2. Lumbar radiculopathy 3. Wrist pain, right 4. Elbow pain, right 5. Chronic bilateral thoracic back pain -Chronic with exacerbation, initial visit - Patient presents with multiple musculoskeletal complaints after a fall on 09/21/2022 - Multiple x-rays obtained due to trauma and patient's TTP.  No interpretation: No acute fractures or dislocations or vertebral collapses.  Chronic appearing degenerative changes in thoracic and lumbar spine.  Will await final radiology reviews - Encourage patient to continue water therapy as tolerated  For pain management recommend the following treatment plan: - Start meloxicam 15 mg daily x2 weeks.  If still having pain after 2 weeks, complete 3rd-week of meloxicam. May use remaining meloxicam as needed once daily for pain control.  Do not to use additional NSAIDs while taking meloxicam.  May use Tylenol [phone] mg 2 to 3 times a day for breakthrough pain. - May use previously prescribed Norco 7.5-325 mg 3 times daily as needed for severe breakthrough pain - May use Flexeril 10 mg nightly as needed for muscle spasms  - DG ELBOW COMPLETE RIGHT (3+VIEW); Future - DG Forearm Right; Future - DG Wrist Complete Left; Future - DG Hand Complete Left; Future - DG Thoracic Spine 2 View; Future - DG Forearm Left; Future - DG Lumbar Spine 2-3 Views; Future  Other orders - meloxicam (MOBIC) 15 MG tablet; Take 1 tablet (15 mg total) by mouth daily.    Pertinent previous records reviewed include none   Follow Up: 3-week follow-up for reevaluation   Subjective:   I, Moenique Parris, am serving as a Education administrator for Doctor Glennon Mac  Chief Complaint: left arm, right hand knot and back pain   HPI:   09/22/22 Patient is a 57 year old female complaining of multiple issus. Patient states she  fell yesterday over and air mattress bag, has been using her hydros , pain with sleeping   Right arm elbow , forearm  Left wrist and hand pain up the arm  Lumbar and thoracic back   Relevant Historical Information: History of PT, COPD, GERD, hypothyroidism,  Additional pertinent review of systems negative.   Current Outpatient Medications:    albuterol (PROVENTIL HFA) 108 (90 Base) MCG/ACT inhaler, Inhale 2 puffs into the lungs every 4 (four) hours as needed for wheezing or shortness of breath., Disp: 8.5 g, Rfl: 11   ALPRAZolam (XANAX) 0.5 MG tablet, TAKE 3 TABLETS BY MOUTH EVERY DAY AT BEDTIME AS NEEDED, Disp: 90 tablet, Rfl: 3   Armodafinil 200 MG TABS, Take 1 tablet by mouth daily., Disp: 90 tablet, Rfl: 1   aspirin (GOODSENSE ASPIRIN) 325 MG tablet, Take 1 tablet (325 mg total) by mouth daily., Disp: 100 tablet, Rfl: 3   buPROPion (WELLBUTRIN SR) 200 MG 12 hr tablet, Take 1 tablet (200 mg total) by mouth daily., Disp: 90 tablet, Rfl: 3   calcitRIOL (ROCALTROL) 0.5 MCG capsule, Take 1 capsule (0.5 mcg total) by mouth in the morning and at bedtime., Disp: 60 capsule, Rfl: 11   cyanocobalamin (VITAMIN B12) 1000 MCG/ML injection, Inject 1 mL (1,000 mcg total) into the muscle every 14 (fourteen) days., Disp: 6 mL, Rfl: 3   cyclobenzaprine (FLEXERIL) 10 MG tablet, Take 1 tablet (10 mg total) by mouth 3 (three) times daily as needed., Disp: 90 tablet, Rfl: 1   diphenoxylate-atropine (LOMOTIL) 2.5-0.025 MG tablet, Take 1  tablet by mouth 4 (four) times daily as needed for diarrhea or loose stools., Disp: 60 tablet, Rfl: 1   HYDROcodone-acetaminophen (NORCO) 7.5-325 MG tablet, Take 1 tablet by mouth every 8 (eight) hours as needed for severe pain., Disp: 90 tablet, Rfl: 0   levothyroxine (SYNTHROID) 100 MCG tablet, TAKE 1 TABLET BY MOUTH ONCE DAILY BEFORE BREAKFAST, Disp: 90 tablet, Rfl: 3   meloxicam (MOBIC) 15 MG tablet, Take 1 tablet (15 mg total) by mouth daily., Disp: 30 tablet, Rfl: 0    ondansetron (ZOFRAN) 4 MG tablet, Take 1 tablet (4 mg total) by mouth every 8 (eight) hours as needed for nausea or vomiting., Disp: 20 tablet, Rfl: 1   pantoprazole (PROTONIX) 40 MG tablet, Take 1 tablet (40 mg total) by mouth 2 (two) times daily., Disp: 180 tablet, Rfl: 3   phentermine (ADIPEX-P) 37.5 MG tablet, Take 1 tablet (37.5 mg total) by mouth daily before breakfast., Disp: 90 tablet, Rfl: 0   Objective:     Vitals:   09/22/22 1400  Pulse: 90  SpO2: 98%  Weight: 268 lb (121.6 kg)  Height: 5\' 3"  (1.6 m)      Body mass index is 47.47 kg/m.    Physical Exam:    Gen: Appears well, nad, nontoxic and pleasant Psych: Alert and oriented, appropriate mood and affect Neuro: sensation intact, strength is 5/5 in upper and lower extremities, muscle tone wnl Skin: no susupicious lesions or rashes  Back - Normal skin, Spine with normal alignment and no deformity.   No tenderness to vertebral process palpation.   Bilateral thoracic and lumbar paraspinous muscles are   tender and without spasm  TTP gluteal musculature Straight leg raise positive for back pain, but negative for radicular symptoms Gait antalgic  TTP left and right forearm, left wrist, left carpals, left snuffbox, right medial and lateral epicondyle  Electronically signed by:  Benito Mccreedy D.[facility] 4:49 PM 09/22/22

## 2022-09-23 ENCOUNTER — Other Ambulatory Visit: Payer: Self-pay | Admitting: Sports Medicine

## 2022-09-23 DIAGNOSIS — M546 Pain in thoracic spine: Secondary | ICD-10-CM

## 2022-09-23 DIAGNOSIS — M25521 Pain in right elbow: Secondary | ICD-10-CM

## 2022-09-23 DIAGNOSIS — M25532 Pain in left wrist: Secondary | ICD-10-CM

## 2022-09-23 DIAGNOSIS — M5416 Radiculopathy, lumbar region: Secondary | ICD-10-CM

## 2022-09-23 DIAGNOSIS — M25522 Pain in left elbow: Secondary | ICD-10-CM

## 2022-09-23 DIAGNOSIS — M25531 Pain in right wrist: Secondary | ICD-10-CM

## 2022-09-23 DIAGNOSIS — G8929 Other chronic pain: Secondary | ICD-10-CM

## 2022-09-29 ENCOUNTER — Other Ambulatory Visit (INDEPENDENT_AMBULATORY_CARE_PROVIDER_SITE_OTHER): Payer: Medicaid Other

## 2022-09-29 DIAGNOSIS — E038 Other specified hypothyroidism: Secondary | ICD-10-CM | POA: Diagnosis not present

## 2022-09-29 DIAGNOSIS — G8929 Other chronic pain: Secondary | ICD-10-CM

## 2022-09-29 LAB — T4, FREE: Free T4: 1.2 ng/dL (ref 0.60–1.60)

## 2022-09-29 LAB — TSH: TSH: 1.47 u[IU]/mL (ref 0.35–5.50)

## 2022-09-30 ENCOUNTER — Encounter: Payer: Self-pay | Admitting: Internal Medicine

## 2022-09-30 ENCOUNTER — Ambulatory Visit: Payer: Medicaid Other | Admitting: Internal Medicine

## 2022-09-30 VITALS — BP 140/82 | HR 75 | Temp 97.9°F | Ht 63.0 in | Wt 265.0 lb

## 2022-09-30 DIAGNOSIS — G9332 Myalgic encephalomyelitis/chronic fatigue syndrome: Secondary | ICD-10-CM | POA: Diagnosis not present

## 2022-09-30 DIAGNOSIS — M5440 Lumbago with sciatica, unspecified side: Secondary | ICD-10-CM

## 2022-09-30 DIAGNOSIS — L930 Discoid lupus erythematosus: Secondary | ICD-10-CM

## 2022-09-30 DIAGNOSIS — F419 Anxiety disorder, unspecified: Secondary | ICD-10-CM

## 2022-09-30 DIAGNOSIS — G2581 Restless legs syndrome: Secondary | ICD-10-CM

## 2022-09-30 DIAGNOSIS — E538 Deficiency of other specified B group vitamins: Secondary | ICD-10-CM | POA: Diagnosis not present

## 2022-09-30 DIAGNOSIS — G8929 Other chronic pain: Secondary | ICD-10-CM

## 2022-09-30 MED ORDER — HYDROCODONE-ACETAMINOPHEN 7.5-325 MG PO TABS: 1.0000 | ORAL_TABLET | Freq: Three times a day (TID) | ORAL | 0 refills | Status: DC | PRN

## 2022-09-30 MED ORDER — ALPRAZOLAM 0.5 MG PO TABS: ORAL_TABLET | ORAL | 3 refills | Status: DC

## 2022-09-30 NOTE — Assessment & Plan Note (Signed)
Worse  - needs to go on Plaquenil or MTX per dermatology to control rash - d/c'd Stress reduction - work 8h shifts

## 2022-09-30 NOTE — Assessment & Plan Note (Signed)
On B12 

## 2022-09-30 NOTE — Assessment & Plan Note (Signed)
Cont w/Armodafinil

## 2022-09-30 NOTE — Progress Notes (Signed)
Subjective:  Patient ID: Tiffany Medina, female    DOB: 1966/06/19  Age: 57 y.o. MRN: [id number]  CC: Follow-up (2 MNTH F/U, REFILL ON Duanne Moron)   HPI Tiffany Medina presents for chronic LBP, lupus, anxiety, somnolence/CFS due to OSA  Outpatient Medications Prior to Visit  Medication Sig Dispense Refill   albuterol (PROVENTIL HFA) 108 (90 Base) MCG/ACT inhaler Inhale 2 puffs into the lungs every 4 (four) hours as needed for wheezing or shortness of breath. 8.5 g 11   Armodafinil 200 MG TABS Take 1 tablet by mouth daily. 90 tablet 1   aspirin (GOODSENSE ASPIRIN) 325 MG tablet Take 1 tablet (325 mg total) by mouth daily. 100 tablet 3   buPROPion (WELLBUTRIN SR) 200 MG 12 hr tablet Take 1 tablet (200 mg total) by mouth daily. 90 tablet 3   calcitRIOL (ROCALTROL) 0.5 MCG capsule Take 1 capsule (0.5 mcg total) by mouth in the morning and at bedtime. 60 capsule 11   cyanocobalamin (VITAMIN B12) 1000 MCG/ML injection Inject 1 mL (1,000 mcg total) into the muscle every 14 (fourteen) days. 6 mL 3   cyclobenzaprine (FLEXERIL) 10 MG tablet Take 1 tablet (10 mg total) by mouth 3 (three) times daily as needed. 90 tablet 1   diphenoxylate-atropine (LOMOTIL) 2.5-0.025 MG tablet Take 1 tablet by mouth 4 (four) times daily as needed for diarrhea or loose stools. 60 tablet 1   levothyroxine (SYNTHROID) 100 MCG tablet TAKE 1 TABLET BY MOUTH ONCE DAILY BEFORE BREAKFAST 90 tablet 3   ondansetron (ZOFRAN) 4 MG tablet Take 1 tablet (4 mg total) by mouth every 8 (eight) hours as needed for nausea or vomiting. 20 tablet 1   pantoprazole (PROTONIX) 40 MG tablet Take 1 tablet (40 mg total) by mouth 2 (two) times daily. 180 tablet 3   phentermine (ADIPEX-P) 37.5 MG tablet Take 1 tablet (37.5 mg total) by mouth daily before breakfast. 90 tablet 0   ALPRAZolam (XANAX) 0.5 MG tablet TAKE 3 TABLETS BY MOUTH EVERY DAY AT BEDTIME AS NEEDED 90 tablet 3   HYDROcodone-acetaminophen (NORCO) 7.5-325 MG tablet Take 1 tablet by mouth  every 8 (eight) hours as needed for severe pain. 90 tablet 0   meloxicam (MOBIC) 15 MG tablet Take 1 tablet (15 mg total) by mouth daily. 30 tablet 0   No facility-administered medications prior to visit.    ROS: Review of Systems  Constitutional:  Positive for fatigue. Negative for activity change, appetite change, chills and unexpected weight change.  HENT:  Negative for congestion, mouth sores and sinus pressure.   Eyes:  Negative for visual disturbance.  Respiratory:  Negative for cough, chest tightness and shortness of breath.   Gastrointestinal:  Negative for abdominal pain and nausea.  Genitourinary:  Negative for difficulty urinating, frequency and vaginal pain.  Musculoskeletal:  Positive for arthralgias, back pain and gait problem.  Skin:  Negative for pallor and rash.  Neurological:  Negative for dizziness, tremors, weakness, numbness and headaches.  Psychiatric/Behavioral:  Negative for confusion, sleep disturbance and suicidal ideas. The patient is nervous/anxious.     Objective:  BP (!) 140/82 (BP Location: Left Arm, Patient Position: Sitting, Cuff Size: Normal)   Pulse 75   Temp 97.9 F (36.6 C) (Oral)   Ht 5\' 3"  (1.6 m)   Wt 265 lb (120.2 kg)   SpO2 97%   BMI 46.94 kg/m   BP Readings from Last 3 Encounters:  09/30/22 (!) 140/82  08/03/22 128/74  05/03/22 (!) 138/94  Wt Readings from Last 3 Encounters:  09/30/22 265 lb (120.2 kg)  09/22/22 268 lb (121.6 kg)  08/03/22 268 lb (121.6 kg)    Physical Exam Constitutional:      General: She is not in acute distress.    Appearance: She is well-developed. She is obese.  HENT:     Head: Normocephalic.     Right Ear: External ear normal.     Left Ear: External ear normal.     Nose: Nose normal.  Eyes:     General:        Right eye: No discharge.        Left eye: No discharge.     Conjunctiva/sclera: Conjunctivae normal.     Pupils: Pupils are equal, round, and reactive to light.  Neck:     Thyroid: No  thyromegaly.     Vascular: No JVD.     Trachea: No tracheal deviation.  Cardiovascular:     Rate and Rhythm: Normal rate and regular rhythm.     Heart sounds: Normal heart sounds.  Pulmonary:     Effort: No respiratory distress.     Breath sounds: No stridor. No wheezing.  Abdominal:     General: Bowel sounds are normal. There is no distension.     Palpations: Abdomen is soft. There is no mass.     Tenderness: There is no abdominal tenderness. There is no guarding or rebound.  Musculoskeletal:        General: No tenderness.     Cervical back: Normal range of motion and neck supple. No rigidity.  Lymphadenopathy:     Cervical: No cervical adenopathy.  Skin:    Findings: No erythema or rash.  Neurological:     Mental Status: She is oriented to person, place, and time.     Cranial Nerves: No cranial nerve deficit.     Motor: No abnormal muscle tone.     Coordination: Coordination normal.     Gait: Gait abnormal.     Deep Tendon Reflexes: Reflexes normal.  Psychiatric:        Behavior: Behavior normal.        Thought Content: Thought content normal.        Judgment: Judgment normal.     Lab Results  Component Value Date   WBC 7.0 02/01/2022   HGB 14.5 02/01/2022   HCT 42.9 02/01/2022   PLT 350.0 02/01/2022   GLUCOSE 95 07/30/2022   CHOL 184 04/20/2021   TRIG 128.0 04/20/2021   HDL 62.50 04/20/2021   LDLCALC 95 04/20/2021   ALT 20 07/30/2022   AST 18 07/30/2022   NA 137 07/30/2022   K 4.2 07/30/2022   CL 99 07/30/2022   CREATININE 0.91 07/30/2022   BUN 25 (H) 07/30/2022   CO2 29 07/30/2022   TSH 1.47 09/29/2022   INR 1.02 11/28/2014   HGBA1C 6.1 07/30/2022    DG INJECT DIAG/THERA/INC NEEDLE/CATH/PLC EPI/LUMB/SAC W/IMG  Result Date: 02/09/2022 CLINICAL DATA:  Lumbosacral spondylosis without myelopathy with radiculopathy. Left-sided low back pain radiating down the left leg. No significant improvement after L4-L5 facet injections and medial branch blocks. No prior  epidural injections or surgery. FLUOROSCOPY: Radiation Exposure Index (as provided by the fluoroscopic device): 5.2 mGy Kerma PROCEDURE: The procedure, risks, benefits, and alternatives were explained to the patient. Questions regarding the procedure were encouraged and answered. The patient understands and consents to the procedure. LUMBAR EPIDURAL INJECTION: An interlaminar approach was performed on the left at L4-L5. The overlying  skin was cleansed and anesthetized. A 6 inch 20 gauge epidural needle was advanced using loss-of-resistance technique. DIAGNOSTIC EPIDURAL INJECTION: Injection of Isovue-M 200 shows a good epidural pattern with spread above and below the level of needle placement, primarily on the left. No vascular opacification is seen. THERAPEUTIC EPIDURAL INJECTION: 80 mg of Depo-Medrol mixed with 3 mL of 1% lidocaine were instilled. The procedure was well-tolerated, and the patient was discharged thirty minutes following the injection in good condition. COMPLICATIONS: None immediate. IMPRESSION: Technically successful interlaminar epidural injection on the left at L4-L5. Electronically Signed   By: Titus Dubin M.D.   On: 02/09/2022 12:33    Assessment & Plan:   Problem List Items Addressed This Visit       Nervous and Auditory   CFS (chronic fatigue syndrome)    Cont w/Armodafinil        Musculoskeletal and Integument   Discoid lupus - Primary    Worse  - needs to go on Plaquenil or MTX per dermatology to control rash - d/c'd Stress reduction - work 8h shifts        Other   Anxiety disorder   Relevant Medications   ALPRAZolam (XANAX) 0.5 MG tablet   B12 deficiency    On B12      Low back pain    UDS pending On Norco to 7.5/325 mg  Potential benefits of a long term opioids use as well as potential risks (i.e. addiction risk, apnea etc) and complications (i.e. Somnolence, constipation and others) were explained to the patient and were aknowledged.      Relevant  Medications   HYDROcodone-acetaminophen (NORCO) 7.5-325 MG tablet   HYDROcodone-acetaminophen (NORCO) 7.5-325 MG tablet (Start on 10/30/2022)   Restless leg syndrome    Chronic On Tramadol, Requip, Gabapentin         Meds ordered this encounter  Medications   ALPRAZolam (XANAX) 0.5 MG tablet    Sig: TAKE 3 TABLETS BY MOUTH EVERY DAY AT BEDTIME AS NEEDED    Dispense:  90 tablet    Refill:  3   HYDROcodone-acetaminophen (NORCO) 7.5-325 MG tablet    Sig: Take 1 tablet by mouth every 8 (eight) hours as needed for severe pain.    Dispense:  90 tablet    Refill:  0   HYDROcodone-acetaminophen (NORCO) 7.5-325 MG tablet    Sig: Take 1 tablet by mouth 3 (three) times daily as needed for severe pain.    Dispense:  90 tablet    Refill:  0      Follow-up: Return in about 2 months (around 11/30/2022) for a follow-up visit.  Walker Kehr, MD

## 2022-09-30 NOTE — Assessment & Plan Note (Signed)
Chronic On Tramadol, Requip, Gabapentin

## 2022-09-30 NOTE — Assessment & Plan Note (Signed)
UDS pending On Norco to 7.5/325 mg  Potential benefits of a long term opioids use as well as potential risks (i.e. addiction risk, apnea etc) and complications (i.e. Somnolence, constipation and others) were explained to the patient and were aknowledged.

## 2022-10-01 ENCOUNTER — Other Ambulatory Visit (HOSPITAL_COMMUNITY): Payer: Self-pay

## 2022-10-02 LAB — PMP SCREEN PROFILE (10S), URINE
Amphetamine Scrn, Ur: NEGATIVE ng/mL
BARBITURATE SCREEN URINE: NEGATIVE ng/mL
BENZODIAZEPINE SCREEN, URINE: POSITIVE ng/mL — AB
CANNABINOIDS UR QL SCN: POSITIVE ng/mL — AB
Cocaine (Metab) Scrn, Ur: NEGATIVE ng/mL
Creatinine(Crt), U: 122.3 mg/dL (ref 20.0–300.0)
Methadone Screen, Urine: NEGATIVE ng/mL
OXYCODONE+OXYMORPHONE UR QL SCN: NEGATIVE ng/mL
Opiate Scrn, Ur: POSITIVE ng/mL — AB
Ph of Urine: 6.1 (ref 4.5–8.9)
Phencyclidine Qn, Ur: NEGATIVE ng/mL
Propoxyphene Scrn, Ur: NEGATIVE ng/mL

## 2022-10-07 ENCOUNTER — Telehealth: Payer: Self-pay

## 2022-10-07 ENCOUNTER — Other Ambulatory Visit (HOSPITAL_COMMUNITY): Payer: Self-pay

## 2022-10-07 NOTE — Telephone Encounter (Signed)
Pharmacy Patient Advocate Encounter   Received notification that prior authorization for Armodafinil 200mg   is required/requested.  Per Test Claim: prior authorization required   PA submitted on 10/07/22 to (ins) [employer] via fax at [phone] Key#  [id number] Status is pending

## 2022-10-08 ENCOUNTER — Encounter: Payer: Self-pay | Admitting: Internal Medicine

## 2022-10-08 NOTE — Telephone Encounter (Signed)
FYI.. pt viewed urine results and reports she has been using CBD oil daily on her wrists, hands, knee and back. She states she will discontinue this and is requesting another test on her next visit with you.

## 2022-10-11 NOTE — Telephone Encounter (Signed)
Pharmacy Patient Advocate Encounter  Prior Authorization for Armodafanil has been approved by [employer] (ins).     Effective dates: 10/07/2022 through 12/03/2022

## 2022-10-13 ENCOUNTER — Ambulatory Visit: Payer: Medicaid Other | Admitting: Family Medicine

## 2022-10-15 ENCOUNTER — Ambulatory Visit: Payer: Self-pay

## 2022-10-15 ENCOUNTER — Ambulatory Visit: Payer: Medicaid Other | Admitting: Family Medicine

## 2022-10-15 ENCOUNTER — Ambulatory Visit (HOSPITAL_COMMUNITY)
Admission: RE | Admit: 2022-10-15 | Discharge: 2022-10-15 | Disposition: A | Discharge status: Home / Self Care | Payer: Medicaid Other | Source: Ambulatory Visit | Attending: Cardiology | Admitting: Cardiology

## 2022-10-15 ENCOUNTER — Ambulatory Visit (INDEPENDENT_AMBULATORY_CARE_PROVIDER_SITE_OTHER): Payer: Medicaid Other

## 2022-10-15 VITALS — BP 142/96 | HR 92 | Ht 63.0 in | Wt 266.0 lb

## 2022-10-15 DIAGNOSIS — M25561 Pain in right knee: Secondary | ICD-10-CM | POA: Diagnosis not present

## 2022-10-15 DIAGNOSIS — M79661 Pain in right lower leg: Secondary | ICD-10-CM

## 2022-10-15 DIAGNOSIS — M7989 Other specified soft tissue disorders: Secondary | ICD-10-CM | POA: Diagnosis present

## 2022-10-15 MED ORDER — IBUPROFEN 800 MG PO TABS: 800.0000 mg | ORAL_TABLET | Freq: Three times a day (TID) | ORAL | 2 refills | Status: DC | PRN

## 2022-10-15 NOTE — Patient Instructions (Addendum)
Thank you for coming in today.   Please get an Xray today before you leave   You have a Vascular Ultrasound scheduled for: Today at 12:00  [facility] [address] [place], [place] [address] Phone: [phone]   Check back in 1 month

## 2022-10-15 NOTE — Progress Notes (Signed)
Preliminary results of vascular ultrasound showed no evidence of DVT.

## 2022-10-15 NOTE — Progress Notes (Signed)
Rubin Payor, PhD, LAT, ATC acting as a scribe for [name], MD.  Tiffany Medina is a 57 y.o. female who presents to [facility] at [facility] today for f/u exacerbation of multiple chronic musculoskeletal complaints after suffering a fall on 09/21/22. Pt was last seen by Dr. Jean Rosenthal on 09/22/22 and multiple XR were obtained and was prescribed meloxicam, advised to use Tylenol, and previously prescribed Norco.   Today, pt reports she is having pain and swelling in her R knee and slightly into her R anterior lower leg. Pain has significantly worsened over the last 4 days. She notes a hx of blood clots and phlebitis. She continues to have LBP. She was intolerant to the meloxicam due to increased SOB.  She is taking ibuprofen and tolerating it well.  She takes 800 mg over-the-counter ibuprofen every 8 hours.  This helps a lot.   Pertinent review of systems: No fevers or chills  Relevant historical information: History of DVT   Exam:  BP (!) 142/96   Pulse 92   Ht 5\' 3"  (1.6 m)   Wt 266 lb (120.7 kg)   SpO2 96%   BMI 47.12 kg/m  General: Well Developed, well nourished, and in no acute distress.   MSK: Right knee mild effusion. Lower extremity is swollen with varicosities. Decreased range of motion knee lacks full extension and flexion. Tender palpation medial joint line. Significant antalgic gait. Strength is intact.    Lab and Radiology Results  Procedure: Real-time Ultrasound Guided Injection of right knee joint superior lateral patellar space Device: Philips Affiniti 50G Images permanently stored and available for review in PACS Mild effusion is present in the superior patellar space.  No Baker's cyst is visible. Verbal informed consent obtained.  Discussed risks and benefits of procedure. Warned about infection, bleeding, hyperglycemia damage to structures among others. Patient expresses understanding and agreement Time-out conducted.   Noted no  overlying erythema, induration, or other signs of local infection.   Skin prepped in a sterile fashion.   Local anesthesia: Topical Ethyl chloride.   With sterile technique and under real time ultrasound guidance: 40 mg of Kenalog and 2 mL Marcaine injected into knee joint. Fluid seen entering the joint capsule.   Completed without difficulty   Pain immediately resolved suggesting accurate placement of the medication.   Advised to call if fevers/chills, erythema, induration, drainage, or persistent bleeding.   Images permanently stored and available for review in the ultrasound unit.  Impression: Technically successful ultrasound guided injection.   X-ray images right knee obtained today personally and independently interpreted Significant medial compartment DJD.  No acute fractures are visible. Await for radiology review   No results found for this or any previous visit (from the past 72 hour(s)). VAS [place] LOWER EXTREMITY VENOUS (DVT)  Result Date: 10/15/2022  Lower Venous DVT Study Patient Name:  Tiffany Medina  Date of Exam:   10/15/2022 Medical Rec #: [id number]       Accession #:    [id number] Date of Birth: 1966/01/28       Patient Gender: F Patient Age:   41 years Exam Location:  [facility] Procedure:      VAS [place] LOWER EXTREMITY VENOUS (DVT) Referring Phys: Jasen Hartstein --------------------------------------------------------------------------------  Indications: Pain, and Edema.  Risk Factors: DVT History left lower extremity DVT >20 years ago, and several episodes of superficial thromboses. obesity and smoker. Anticoagulation: ASA 325 mg/day. Performing Technologist: Vella Kohler BS, RVT, RDCS  Examination Guidelines: A complete  evaluation includes B-mode imaging, spectral Doppler, color Doppler, and power Doppler as needed of all accessible portions of each vessel. Bilateral testing is considered an integral part of a complete examination. Limited examinations for reoccurring indications may  be performed as noted. The reflux portion of the exam is performed with the patient in reverse Trendelenburg.  +---------+---------------+---------+-----------+----------+--------------+ RIGHT    CompressibilityPhasicitySpontaneityPropertiesThrombus Aging +---------+---------------+---------+-----------+----------+--------------+ CFV      Full           Yes      Yes                                 +---------+---------------+---------+-----------+----------+--------------+ SFJ      Full           Yes      Yes                                 +---------+---------------+---------+-----------+----------+--------------+ FV Prox  Full           Yes      Yes                                 +---------+---------------+---------+-----------+----------+--------------+ FV Mid   Full           Yes      Yes                                 +---------+---------------+---------+-----------+----------+--------------+ FV DistalFull           Yes      Yes                                 +---------+---------------+---------+-----------+----------+--------------+ PFV      Full           Yes      Yes                                 +---------+---------------+---------+-----------+----------+--------------+ POP      Full           Yes      Yes                                 +---------+---------------+---------+-----------+----------+--------------+ PTV      Full           No       Yes                                 +---------+---------------+---------+-----------+----------+--------------+ PERO     Full           No       Yes                                 +---------+---------------+---------+-----------+----------+--------------+ Gastroc  Full                                                        +---------+---------------+---------+-----------+----------+--------------+  GSV      Full                                                         +---------+---------------+---------+-----------+----------+--------------+   +----+---------------+---------+-----------+----------+--------------+ LEFTCompressibilityPhasicitySpontaneityPropertiesThrombus Aging +----+---------------+---------+-----------+----------+--------------+ CFV Full           Yes      Yes                                 +----+---------------+---------+-----------+----------+--------------+    Findings reported to Dr. Denyse Amass at 12:15PM.  Summary: RIGHT: - No evidence of deep vein thrombosis in the lower extremity. No indirect evidence of obstruction proximal to the inguinal ligament. - There is no evidence of superficial venous thrombosis.  - No cystic structure found in the popliteal fossa.  LEFT: - No evidence of common femoral vein obstruction.  *See table(s) above for measurements and observations.    Preliminary        Assessment and Plan: 57 y.o. female with right knee pain thought to be due to exacerbation of DJD.  Plan for steroid injection today.  She does have left leg swelling that DVT is a concern.  Vascular ultrasound ordered and appears to be normal.  This is reassuring.  Will also use ibuprofen as needed as she is already taking it now.  Will prescribe prescription strength ibuprofen to use in more controlled manner.  Recheck in 1 month.   PDMP not reviewed this encounter. Orders Placed This Encounter  Procedures   [place] LIMITED JOINT SPACE STRUCTURES LOW RIGHT(NO LINKED CHARGES)    Order Specific Question:   Reason for Exam (SYMPTOM  OR DIAGNOSIS REQUIRED)    Answer:   right knee pain    Order Specific Question:   Preferred imaging location?    Answer:   [facility] Knee AP/LAT W/[facility] Right    Standing Status:   Future    Number of Occurrences:   1    Standing Expiration Date:   11/14/2022    Order Specific Question:   Reason for Exam (SYMPTOM  OR DIAGNOSIS REQUIRED)    Answer:   right knee pain    Order  Specific Question:   Preferred imaging location?    Answer:   [facility]    Order Specific Question:   Is patient pregnant?    Answer:   No   Meds ordered this encounter  Medications   ibuprofen (ADVIL) 800 MG tablet    Sig: Take 1 tablet (800 mg total) by mouth every 8 (eight) hours as needed.    Dispense:  90 tablet    Refill:  2     Discussed warning signs or symptoms. Please see discharge instructions. Patient expresses understanding.   The above documentation has been reviewed and is accurate and complete [name], M.D.

## 2022-10-18 NOTE — Progress Notes (Signed)
Right knee x-ray shows arthritis changes worse on the inside or medial part of the knee.

## 2022-10-18 NOTE — Progress Notes (Signed)
No evidence of DVT on vascular ultrasound.

## 2022-10-19 ENCOUNTER — Other Ambulatory Visit: Payer: Self-pay | Admitting: Sports Medicine

## 2022-11-11 DIAGNOSIS — Z0289 Encounter for other administrative examinations: Secondary | ICD-10-CM

## 2022-11-11 NOTE — Progress Notes (Signed)
   I, Stevenson Clinch, CMA acting as a scribe for [name], MD.  Tiffany Medina is a 57 y.o. female who presents to [facility] at [facility] today for f/u R knee and lower leg swelling. Pt was last seen by Dr. Denyse Amass on 10/15/22 and was given a R knee steroid injection, a vascular US was ordered, and she was prescribed IBU. Today, pt reports negative vascular u/s. IBU has been helpful, so has steroid injection. Sx worsen with ambulation, but reports about 75% improvement overall. Some swelling still present.   Dx testing: 10/15/22 R knee XR & R LE vasc US  Pertinent review of systems: No fevers or chills  Relevant historical information: History of DVT.  Knee osteoarthritis.  Obesity.   Exam:  BP (!) 124/94   Pulse 88   Ht 5\' 3"  (1.6 m)   Wt 272 lb (123.4 kg)   SpO2 96%   BMI 48.18 kg/m  General: Well Developed, well nourished, and in no acute distress.   MSK: Right knee mild effusion.  Normal motion with crepitation.    Lab and Radiology Results  EXAM: RIGHT KNEE 3 VIEWS   COMPARISON:  None Available.   FINDINGS: Tricompartmental degenerative changes most marked in the medial compartment with significant loss of joint space medially. No fracture or dislocation. No joint effusion. No other acute abnormalities.   IMPRESSION: Tricompartmental degenerative changes most marked in the medial compartment with significant loss of joint space medially.     Electronically Signed   By: Gerome Sam III M.D.   On: 10/15/2022 13:30 I, [name], personally (independently) visualized and performed the interpretation of the images attached in this note.  Vascular ultrasound assessing DVT date of service 10/15/2022 Summary:  RIGHT:  - No evidence of deep vein thrombosis in the lower extremity. No indirect  evidence of obstruction proximal to the inguinal ligament.  - There is no evidence of superficial venous thrombosis.    - No cystic structure found in  the popliteal fossa.    LEFT:  - No evidence of common femoral vein obstruction.     *See table(s) above for measurements and observations.   Electronically signed by Lance Muss MD on 10/15/2022 at 10:13:18 PM.     Assessment and Plan: 57 y.o. female with right knee pain and swelling due to DJD.  Steroid injection performed a month ago.  Doing pretty well.  Anticipate repeating injection at the 48-month mark which would be July 12 or later.  If this is not sufficient could try to authorize hyaluronic acid injections or Zilretta injection.  Some Medicaid plans do not cover this however.  In the future is likely that she will require knee replacement.  She is currently working to lose weight which is essential for this.  Her BMI is 48.  BMI needs to be under 40 for knee replacement.      Discussed warning signs or symptoms. Please see discharge instructions. Patient expresses understanding.   The above documentation has been reviewed and is accurate and complete [name], M.D.

## 2022-11-12 ENCOUNTER — Other Ambulatory Visit: Payer: Medicaid Other

## 2022-11-12 ENCOUNTER — Encounter: Payer: Self-pay | Admitting: Family Medicine

## 2022-11-12 ENCOUNTER — Ambulatory Visit (INDEPENDENT_AMBULATORY_CARE_PROVIDER_SITE_OTHER): Payer: Medicaid Other | Admitting: Family Medicine

## 2022-11-12 VITALS — BP 124/94 | HR 88 | Ht 63.0 in | Wt 272.0 lb

## 2022-11-12 DIAGNOSIS — G8929 Other chronic pain: Secondary | ICD-10-CM

## 2022-11-12 DIAGNOSIS — M25561 Pain in right knee: Secondary | ICD-10-CM

## 2022-11-12 DIAGNOSIS — M1711 Unilateral primary osteoarthritis, right knee: Secondary | ICD-10-CM

## 2022-11-12 NOTE — Patient Instructions (Signed)
Thank you for coming in today.   Your knee is wearing out.   We can repeat the cortisone shot in mid July.  Ok to schedule around July 12 if you would like.   If we need to try the gel shot but we need some warning ahead of time.   Thank you for coming in today.   Let me know what you need if sooner.

## 2022-11-14 LAB — SPECIFIC GRAVITY (REFLEXED): SPECIFIC GRAVITY: 1.0042

## 2022-11-14 LAB — PMP SCREEN PROFILE (10S), URINE
Amphetamine Scrn, Ur: NEGATIVE ng/mL
BARBITURATE SCREEN URINE: NEGATIVE ng/mL
BENZODIAZEPINE SCREEN, URINE: NEGATIVE ng/mL
CANNABINOIDS UR QL SCN: NEGATIVE ng/mL
Cocaine (Metab) Scrn, Ur: NEGATIVE ng/mL
Creatinine(Crt), U: 16.4 mg/dL — ABNORMAL LOW (ref 20.0–300.0)
Methadone Screen, Urine: NEGATIVE ng/mL
OXYCODONE+OXYMORPHONE UR QL SCN: NEGATIVE ng/mL
Opiate Scrn, Ur: NEGATIVE ng/mL
Ph of Urine: 6.4 (ref 4.5–8.9)
Phencyclidine Qn, Ur: NEGATIVE ng/mL
Propoxyphene Scrn, Ur: NEGATIVE ng/mL

## 2022-11-15 ENCOUNTER — Encounter: Payer: Self-pay | Admitting: Internal Medicine

## 2022-11-16 ENCOUNTER — Other Ambulatory Visit: Payer: Self-pay | Admitting: Internal Medicine

## 2022-11-16 ENCOUNTER — Ambulatory Visit (INDEPENDENT_AMBULATORY_CARE_PROVIDER_SITE_OTHER): Payer: PRIVATE HEALTH INSURANCE | Admitting: Family Medicine

## 2022-11-16 NOTE — Progress Notes (Incomplete)
Tiffany Medina, D.O.  ABFM, ABOM Specializing in Clinical Bariatric Medicine Office located at: [address] [place], [place]  [address]     Initial Bariatric Medicine Consultation Visit  Dear Tiffany Medina, Tiffany Quint, MD   Thank you for referring Tiffany Medina to our clinic today for evaluation.  We performed a consultation to discuss her options for treatment and educate the patient on her disease state.  The following note includes my evaluation and treatment recommendations.   Please do not hesitate to reach out to me directly if you have any further concerns.    Assessment and Plan:   No orders of the defined types were placed in this encounter.   There are no discontinued medications.   No orders of the defined types were placed in this encounter.    ***  1) Fatigue Assessment: Condition is {EWCOURSE:24262}.  Tiffany Medina {DOES_DOES YQI:34742} feel that her weight is causing her energy to be lower than it should be. Fatigue may be related to obesity, depression or many other causes. she does not appear to have any red flag symptoms and this appears to most likely related to her current lifestyle habits and dietary intake.  Plan:  Labs will be ordered and reviewed with her at their next office visit in two weeks. Epworth sleepiness scale score appears to be within normal limits.  Her ESS score is ***.   Tiffany Medina {Actions; denies/reports/admits to:19208} daytime somnolence and {Actions; denies/reports/admits to:19208} waking up still tired. Patient has a history of symptoms of {OSA Sx:17850}. Tiffany Medina generally gets {numbers (fuzzy):14653} hours of sleep per night, and states that she has {sleep quality:17851}. Snoring {is/are not:32546} present. Apneic episodes {is/are not:32546} present.  ECG: Normal sinus rhythm, rate ***bpm; reassuring without any acute abnormalities, will continue to monitor for symptoms  Modified PHQ-9 Depression Screen: Her Food and Mood (modified  PHQ-9) score was ***. In the meanwhile, Tiffany Medina will focus on self care including making healthy food choices by following their meal plan, improving sleep quality and focusing on stress reduction.  Once we are assured she is on an appropriate meal plan, we will start discussing exercise to increase cardiovascular fitness levels.    2) Shortness of breath on exertion Assessment: Condition is {EWCOURSE:24262}. Jem {DOES_DOES VZD:63875} feel that she gets out of breath more easily than she used to when she exercises and seems to be worsening over time with weight gain.  This {HAS HAS IEP:32951} gotten worse recently. Sarely denies shortness of breath at rest or orthopnea. Tiffany Medina's shortness of breath appears to be obesity related and exercise induced, as they do not appear to have any "red flag" symptoms/ concerns today.  Also, this condition appears to be related to a state of poor cardiovascular conditioning   Plan:  Obtain labs today and will be reviewed with her at their next office visit in two weeks. Indirect Calorimeter completed today to help guide our dietary regimen. It shows a VO2 of *** and a REE of ***.  Her calculated basal metabolic rate is *** thus her measured basal metabolic rate is {DESC; BETTER/WORSE:18575} than expected. Patient agreed to work on weight loss at this time.  As Tiffany Medina progresses through our weight loss program, we will gradually increase exercise as tolerated to treat her current condition.   If Tomecka follows our recommendations and loses 5-10% of their weight without improvement of her shortness of breath or if at any time, symptoms become more concerning, they agree to urgently follow up  with their PCP/ specialist for further consideration/ evaluation.   Tiffany Medina verbalizes agreement with this plan.   There are no diagnoses linked to this encounter.     TREATMENT PLAN FOR OBESITY:  Assessment: Condition is {EWCOURSE:24262}. {BiometricData  (Optional):29179} Fat mass has {DID:29233} by ***lb. Muscle mass has {DID:29233} by ***lb. Total body water has {DID:29233} by ***lb.   Plan:  Tiffany Medina is currently in the action stage of change. As such, her goal is to continue weight management plan. Tiffany Medina will work on healthier eating habits and try their best to follow the {mealplan:29239} best they can.   Behavioral Intervention Additional resources provided today: {weightresources:29185} Evidence-based interventions for health behavior change were utilized today including the discussion of self monitoring techniques, problem-solving barriers and SMART goal setting techniques.   Regarding patient's less desirable eating habits and patterns, we employed the technique of small changes.  Pt will specifically work on: *** for next visit.    Recommended Physical Activity Goals Tiffany Medina has been advised to gradually work up to 150 minutes of moderate intensity aerobic activity a week and strengthening exercises 2-3 times per week for cardiovascular health, weight loss maintenance and preservation of muscle mass.  She has agreed to continue their current level of activity  FOLLOW UP: Follow up in 2 weeks. She was informed of the importance of frequent follow up visits to maximize her success with intensive lifestyle modifications for her multiple health conditions.  Icela Blacknall is aware that we will review all of her lab results at our next visit.  She is aware that if anything is critical/ life threatening with the results, we will be contacting her via MyChart prior to the office visit to discuss management.     Chief Complaint:   OBESITY Tiffany Medina (MR# [id number]) is a 57 y.o. female who presents for evaluation and treatment of obesity and related comorbidities. Current BMI is There is no height or weight on file to calculate BMI. Tiffany Medina has been struggling with her weight for many years and has been unsuccessful in either  losing weight, maintaining weight loss, or reaching her healthy weight goal.  Tiffany Medina is currently in the action stage of change and ready to dedicate time achieving and maintaining a healthier weight. Tiffany Medina is interested in becoming our patient and working on intensive lifestyle modifications including (but not limited to) diet and exercise for weight loss.  Tiffany Medina works as a ***. Patient {relationshipstatus:29186} and {HAS/DOES NOT WUJW:11914} children. She lives with ***.  Tiffany Medina's habits were reviewed today and are as follows: {MWM WT HABITS:23461}.  ***      Subjective:   This is the patient's first visit at [facility].  The patient's NEW PATIENT PACKET that they filled out prior to today's office visit was reviewed at length and information from that paperwork was included within the following office visit note.    Included in the packet: current and past health history, medications, allergies, ROS, gynecologic history (women only), surgical history, family history, social history, weight history, weight loss surgery history (for those that have had weight loss surgery), nutritional evaluation, mood and food questionnaire along with a depression screening (PHQ9) on all patients, an Epworth questionnaire, sleep habits questionnaire, patient life and health improvement goals questionnaire. These will all be scanned into the patient's chart under the "media" tab.   Review of Systems: Please refer to new patient packet scanned into media. Pertinent positives were addressed with patient today.  Objective:   PHYSICAL EXAM: There were no vitals taken for this visit. There is no height or weight on file to calculate BMI. General: Well Developed, well nourished, and in no acute distress.  HEENT: Normocephalic, atraumatic Skin: Warm and dry, cap RF less 2 sec, good turgor Chest:  Normal excursion, shape, no gross abn Respiratory: speaking in  full sentences, no conversational dyspnea NeuroM-Sk: Ambulates Medina/o assistance, moves * 4 Psych: A and O *3, insight good, mood-full  No data recorded No data recorded No data recorded  DIAGNOSTIC DATA REVIEWED:  BMET    Component Value Date/Time   NA 137 07/30/2022 1104   NA 139 08/08/2017 0844   K 4.2 07/30/2022 1104   CL 99 07/30/2022 1104   CO2 29 07/30/2022 1104   GLUCOSE 95 07/30/2022 1104   BUN 25 (H) 07/30/2022 1104   BUN 17 08/08/2017 0844   CREATININE 0.91 07/30/2022 1104   CALCIUM 9.3 07/30/2022 1104   GFRNONAA 97 08/08/2017 0844   GFRAA 111 08/08/2017 0844   Lab Results  Component Value Date   HGBA1C 6.1 07/30/2022   HGBA1C 6.0 (H) 04/11/2017   Lab Results  Component Value Date   INSULIN 12.3 02/02/2018   INSULIN 23.7 04/11/2017   Lab Results  Component Value Date   TSH 1.47 09/29/2022   CBC    Component Value Date/Time   WBC 7.0 02/01/2022 1141   RBC 4.57 02/01/2022 1141   HGB 14.5 02/01/2022 1141   HGB 13.9 04/11/2017 1040   HGB 14.4 12/25/2010 1504   HCT 42.9 02/01/2022 1141   HCT 41.6 04/11/2017 1040   HCT 41.5 12/25/2010 1504   PLT 350.0 02/01/2022 1141   PLT 329 12/25/2010 1504   MCV 94.0 02/01/2022 1141   MCV 92 04/11/2017 1040   MCV 94.7 12/25/2010 1504   MCH 30.8 04/11/2017 1040   MCH 31.7 12/02/2016 1756   MCHC 33.7 02/01/2022 1141   RDW 13.7 02/01/2022 1141   RDW 13.6 04/11/2017 1040   RDW 13.0 12/25/2010 1504   Iron Studies No results found for: "IRON", "TIBC", "FERRITIN", "IRONPCTSAT" Lipid Panel     Component Value Date/Time   CHOL 184 04/20/2021 0940   CHOL 164 08/08/2017 0844   TRIG 128.0 04/20/2021 0940   HDL 62.50 04/20/2021 0940   HDL 46 08/08/2017 0844   CHOLHDL 3 04/20/2021 0940   VLDL 25.6 04/20/2021 0940   LDLCALC 95 04/20/2021 0940   LDLCALC 97 08/08/2017 0844   Hepatic Function Panel     Component Value Date/Time   PROT 7.2 07/30/2022 1104   PROT 6.6 08/08/2017 0844   ALBUMIN 4.2 07/30/2022 1104    ALBUMIN 4.4 08/08/2017 0844   AST 18 07/30/2022 1104   ALT 20 07/30/2022 1104   ALKPHOS 79 07/30/2022 1104   BILITOT 0.3 07/30/2022 1104   BILITOT 0.2 08/08/2017 0844   BILIDIR 0.1 08/21/2018 1430      Component Value Date/Time   TSH 1.47 09/29/2022 1619   Nutritional Lab Results  Component Value Date   VD25OH 14.88 (L) 07/30/2022   VD25OH 12.72 (L) 02/01/2022   VD25OH 27.83 (L) 02/15/2020    Attestation Statements:   Reviewed by clinician on day of visit: allergies, medications, problem list, medical history, surgical history, family history, social history, and previous encounter notes.  During the visit, I independently reviewed the patient's EKG, bioimpedance scale results, and indirect calorimeter results. I used this information to tailor a meal plan for the patient that will help  [name] to lose weight and will improve her obesity-related conditions going forward.  I performed a medically necessary appropriate examination and/or evaluation. I discussed the assessment and treatment plan with the patient. The patient was provided an opportunity to ask questions and all were answered. The patient agreed with the plan and demonstrated an understanding of the instructions. Labs were ordered today (unless patient declined them) and will be reviewed with the patient at our next visit unless more critical results need to be addressed immediately. Clinical information was updated and documented in the EMR.  Time spent on visit including pre-visit chart review and post-visit care was estimated to be 60 *** minutes.    I,Special Puri,acting as a Neurosurgeon for [employer], DO.,have documented all relevant documentation on the behalf of Thomasene Lot, DO,as directed by  Thomasene Lot, DO while in the presence of Thomasene Lot, DO.   I, Thomasene Lot, DO, have reviewed all documentation for this visit. The documentation on 11/16/22 for the exam, diagnosis, procedures, and  orders are all accurate and complete.

## 2022-11-19 ENCOUNTER — Ambulatory Visit: Payer: Medicaid Other | Admitting: Internal Medicine

## 2022-11-19 ENCOUNTER — Encounter: Payer: Self-pay | Admitting: Internal Medicine

## 2022-11-19 VITALS — BP 130/82 | HR 100 | Temp 98.4°F | Ht 63.0 in | Wt 269.0 lb

## 2022-11-19 DIAGNOSIS — F419 Anxiety disorder, unspecified: Secondary | ICD-10-CM | POA: Diagnosis not present

## 2022-11-19 DIAGNOSIS — E559 Vitamin D deficiency, unspecified: Secondary | ICD-10-CM | POA: Diagnosis not present

## 2022-11-19 DIAGNOSIS — F988 Other specified behavioral and emotional disorders with onset usually occurring in childhood and adolescence: Secondary | ICD-10-CM

## 2022-11-19 DIAGNOSIS — G8929 Other chronic pain: Secondary | ICD-10-CM

## 2022-11-19 DIAGNOSIS — E538 Deficiency of other specified B group vitamins: Secondary | ICD-10-CM

## 2022-11-19 DIAGNOSIS — Z6841 Body Mass Index (BMI) 40.0 and over, adult: Secondary | ICD-10-CM

## 2022-11-19 DIAGNOSIS — G9332 Myalgic encephalomyelitis/chronic fatigue syndrome: Secondary | ICD-10-CM | POA: Diagnosis not present

## 2022-11-19 DIAGNOSIS — M5440 Lumbago with sciatica, unspecified side: Secondary | ICD-10-CM

## 2022-11-19 DIAGNOSIS — E038 Other specified hypothyroidism: Secondary | ICD-10-CM

## 2022-11-19 DIAGNOSIS — L930 Discoid lupus erythematosus: Secondary | ICD-10-CM

## 2022-11-19 MED ORDER — HYDROCODONE-ACETAMINOPHEN 7.5-325 MG PO TABS: 1.0000 | ORAL_TABLET | Freq: Three times a day (TID) | ORAL | 0 refills | Status: DC | PRN

## 2022-11-19 MED ORDER — ALPRAZOLAM 0.5 MG PO TABS: ORAL_TABLET | ORAL | 3 refills | Status: DC

## 2022-11-19 MED ORDER — AMPHETAMINE-DEXTROAMPHETAMINE 5 MG PO TABS: ORAL_TABLET | ORAL | 0 refills | Status: DC

## 2022-11-19 NOTE — Assessment & Plan Note (Addendum)
Provigil, nuvigil - not covered; adderall is covered Will Rx Adderall - monitor HR  Potential benefits of a long term amphetamines  use as well as potential risks  and complications were explained to the patient and were aknowledged.

## 2022-11-19 NOTE — Assessment & Plan Note (Signed)
On Rocaltrol 

## 2022-11-19 NOTE — Assessment & Plan Note (Signed)
Provigil, nuvigil - not covered; adderall is covered Will Rx Adderall - monitor HR  Potential benefits of a long term amphetamines  use as well as potential risks  and complications were explained to the patient and were aknowledged.  

## 2022-11-19 NOTE — Assessment & Plan Note (Signed)
Pool exercises

## 2022-11-19 NOTE — Assessment & Plan Note (Signed)
Starting [facility]

## 2022-11-19 NOTE — Assessment & Plan Note (Signed)
On B12 

## 2022-11-19 NOTE — Assessment & Plan Note (Signed)
FT4 was high - reduced Levothroid to 50 mc/d. Check TSH, FT4 in 4 weeks 

## 2022-11-19 NOTE — Progress Notes (Unsigned)
Subjective:  Patient ID: Tiffany Medina, female    DOB: [birth year]  Age: 57 y.o. MRN: [id number]  CC: Follow-up   HPI Tiffany Medina presents for LBP, anxiety, sleep disorder Provigil, nuvigil - not covered; adderall is covered THC came from CBD cream  Outpatient Medications Prior to Visit  Medication Sig Dispense Refill   albuterol (PROVENTIL HFA) 108 (90 Base) MCG/ACT inhaler Inhale 2 puffs into the lungs every 4 (four) hours as needed for wheezing or shortness of breath. 8.5 g 11   ALPRAZolam (XANAX) 0.5 MG tablet TAKE 3 TABLETS BY MOUTH EVERY DAY AT BEDTIME AS NEEDED 90 tablet 3   Armodafinil 200 MG TABS Take 1 tablet by mouth daily. 90 tablet 1   aspirin (GOODSENSE ASPIRIN) 325 MG tablet Take 1 tablet (325 mg total) by mouth daily. 100 tablet 3   buPROPion (WELLBUTRIN SR) 200 MG 12 hr tablet Take 1 tablet (200 mg total) by mouth daily. 90 tablet 3   calcitRIOL (ROCALTROL) 0.5 MCG capsule Take 1 capsule (0.5 mcg total) by mouth in the morning and at bedtime. 60 capsule 11   cyanocobalamin (VITAMIN B12) 1000 MCG/ML injection Inject 1 mL (1,000 mcg total) into the muscle every 14 (fourteen) days. 6 mL 3   cyclobenzaprine (FLEXERIL) 10 MG tablet TAKE 1 TABLET 3 TIMES A DAY AS NEEDED 90 tablet 1   diphenoxylate-atropine (LOMOTIL) 2.5-0.025 MG tablet Take 1 tablet by mouth 4 (four) times daily as needed for diarrhea or loose stools. 60 tablet 1   HYDROcodone-acetaminophen (NORCO) 7.5-325 MG tablet Take 1 tablet by mouth every 8 (eight) hours as needed for severe pain. 90 tablet 0   HYDROcodone-acetaminophen (NORCO) 7.5-325 MG tablet Take 1 tablet by mouth 3 (three) times daily as needed for severe pain. 90 tablet 0   ibuprofen (ADVIL) 800 MG tablet Take 1 tablet (800 mg total) by mouth every 8 (eight) hours as needed. 90 tablet 2   levothyroxine (SYNTHROID) 100 MCG tablet TAKE 1 TABLET BY MOUTH ONCE DAILY BEFORE BREAKFAST 90 tablet 3   ondansetron (ZOFRAN) 4 MG tablet Take 1 tablet (4 mg  total) by mouth every 8 (eight) hours as needed for nausea or vomiting. 20 tablet 1   pantoprazole (PROTONIX) 40 MG tablet Take 1 tablet (40 mg total) by mouth 2 (two) times daily. 180 tablet 3   phentermine (ADIPEX-P) 37.5 MG tablet Take 1 tablet (37.5 mg total) by mouth daily before breakfast. 90 tablet 0   No facility-administered medications prior to visit.    ROS: Review of Systems  Constitutional:  Negative for activity change, appetite change, chills, fatigue and unexpected weight change.  HENT:  Negative for congestion, mouth sores and sinus pressure.   Eyes:  Negative for visual disturbance.  Respiratory:  Negative for cough and chest tightness.   Gastrointestinal:  Negative for abdominal pain and nausea.  Genitourinary:  Negative for difficulty urinating, frequency and vaginal pain.  Musculoskeletal:  Positive for arthralgias, back pain and gait problem.  Skin:  Negative for pallor and rash.  Neurological:  Negative for dizziness, tremors, weakness, numbness and headaches.  Psychiatric/Behavioral:  Positive for dysphoric mood. Negative for confusion, sleep disturbance and suicidal ideas.     Objective:  BP 130/82 (BP Location: Left Arm, Patient Position: Sitting, Cuff Size: Large)   Pulse 100   Temp 98.4 F (36.9 C) (Oral)   Ht 5\' 3"  (1.6 m)   Wt 269 lb (122 kg)   SpO2 96%   BMI 47.65 kg/m  BP Readings from Last 3 Encounters:  11/19/22 130/82  11/12/22 (!) 124/94  10/15/22 (!) 142/96    Wt Readings from Last 3 Encounters:  11/19/22 269 lb (122 kg)  11/12/22 272 lb (123.4 kg)  10/15/22 266 lb (120.7 kg)    Physical Exam Constitutional:      General: She is not in acute distress.    Appearance: She is well-developed. She is obese.  HENT:     Head: Normocephalic.     Right Ear: External ear normal.     Left Ear: External ear normal.     Nose: Nose normal.  Eyes:     General:        Right eye: No discharge.        Left eye: No discharge.      Conjunctiva/sclera: Conjunctivae normal.     Pupils: Pupils are equal, round, and reactive to light.  Neck:     Thyroid: No thyromegaly.     Vascular: No JVD.     Trachea: No tracheal deviation.  Cardiovascular:     Rate and Rhythm: Normal rate and regular rhythm.     Heart sounds: Normal heart sounds.  Pulmonary:     Effort: No respiratory distress.     Breath sounds: No stridor. No wheezing.  Abdominal:     General: Bowel sounds are normal. There is no distension.     Palpations: Abdomen is soft. There is no mass.     Tenderness: There is no abdominal tenderness. There is no guarding or rebound.  Musculoskeletal:        General: Tenderness present.     Cervical back: Normal range of motion and neck supple. No rigidity.  Lymphadenopathy:     Cervical: No cervical adenopathy.  Skin:    Findings: No erythema or rash.  Neurological:     Cranial Nerves: No cranial nerve deficit.     Motor: No abnormal muscle tone.     Coordination: Coordination normal.     Gait: Gait abnormal.     Deep Tendon Reflexes: Reflexes normal.  Psychiatric:        Behavior: Behavior normal.        Thought Content: Thought content normal.        Judgment: Judgment normal.     Lab Results  Component Value Date   WBC 7.0 02/01/2022   HGB 14.5 02/01/2022   HCT 42.9 02/01/2022   PLT 350.0 02/01/2022   GLUCOSE 95 07/30/2022   CHOL 184 04/20/2021   TRIG 128.0 04/20/2021   HDL 62.50 04/20/2021   LDLCALC 95 04/20/2021   ALT 20 07/30/2022   AST 18 07/30/2022   NA 137 07/30/2022   K 4.2 07/30/2022   CL 99 07/30/2022   CREATININE 0.91 07/30/2022   BUN 25 (H) 07/30/2022   CO2 29 07/30/2022   TSH 1.47 09/29/2022   INR 1.02 11/28/2014   HGBA1C 6.1 07/30/2022    [place] LIMITED JOINT SPACE STRUCTURES LOW RIGHT(NO LINKED CHARGES)  Result Date: 10/21/2022 Procedure: Real-time Ultrasound Guided Injection of right knee joint superior lateral patellar space Device: Philips Affiniti 50G Images permanently  stored and available for review in PACS Mild effusion is present in the superior patellar space.  No Baker's cyst is visible. Verbal informed consent obtained.  Discussed risks and benefits of procedure. Warned about infection, bleeding, hyperglycemia damage to structures among others. Patient expresses understanding and agreement Time-out conducted.  Noted no overlying erythema, induration, or other signs of local infection.  Skin prepped in a sterile fashion.  Local anesthesia: Topical Ethyl chloride.  With sterile technique and under real time ultrasound guidance: 40 mg of Kenalog and 2 mL Marcaine injected into knee joint. Fluid seen entering the joint capsule.  Completed without difficulty  Pain immediately resolved suggesting accurate placement of the medication.  Advised to call if fevers/chills, erythema, induration, drainage, or persistent bleeding.  Images permanently stored and available for review in the ultrasound unit. Impression: Technically successful ultrasound guided injection.    VAS [place] LOWER EXTREMITY VENOUS (DVT)  Result Date: 10/15/2022  Lower Venous DVT Study Patient Name:  Tiffany Medina  Date of Exam:   10/15/2022 Medical Rec #: [id number]       Accession #:    [id number] Date of Birth: 04/07/1966       Patient Gender: F Patient Age:   56 years Exam Location:  [facility] Procedure:      VAS [place] LOWER EXTREMITY VENOUS (DVT) Referring Phys: EVAN COREY --------------------------------------------------------------------------------  Indications: Pain, and Edema.  Risk Factors: DVT History left lower extremity DVT >20 years ago, and several episodes of superficial thromboses. obesity and smoker. Anticoagulation: ASA 325 mg/day. Performing Technologist: [employer] BS, RVT, RDCS  Examination Guidelines: A complete evaluation includes B-mode imaging, spectral Doppler, color Doppler, and power Doppler as needed of all accessible portions of each vessel. Bilateral testing is considered an integral  part of a complete examination. Limited examinations for reoccurring indications may be performed as noted. The reflux portion of the exam is performed with the patient in reverse Trendelenburg.  +---------+---------------+---------+-----------+----------+--------------+ RIGHT    CompressibilityPhasicitySpontaneityPropertiesThrombus Aging +---------+---------------+---------+-----------+----------+--------------+ CFV      Full           Yes      Yes                                 +---------+---------------+---------+-----------+----------+--------------+ SFJ      Full           Yes      Yes                                 +---------+---------------+---------+-----------+----------+--------------+ FV Prox  Full           Yes      Yes                                 +---------+---------------+---------+-----------+----------+--------------+ FV Mid   Full           Yes      Yes                                 +---------+---------------+---------+-----------+----------+--------------+ FV DistalFull           Yes      Yes                                 +---------+---------------+---------+-----------+----------+--------------+ PFV      Full           Yes      Yes                                 +---------+---------------+---------+-----------+----------+--------------+  POP      Full           Yes      Yes                                 +---------+---------------+---------+-----------+----------+--------------+ PTV      Full           No       Yes                                 +---------+---------------+---------+-----------+----------+--------------+ PERO     Full           No       Yes                                 +---------+---------------+---------+-----------+----------+--------------+ Gastroc  Full                                                        +---------+---------------+---------+-----------+----------+--------------+ GSV       Full                                                        +---------+---------------+---------+-----------+----------+--------------+   +----+---------------+---------+-----------+----------+--------------+ LEFTCompressibilityPhasicitySpontaneityPropertiesThrombus Aging +----+---------------+---------+-----------+----------+--------------+ CFV Full           Yes      Yes                                 +----+---------------+---------+-----------+----------+--------------+    Findings reported to Dr. Denyse Amass at 12:15PM.  Summary: RIGHT: - No evidence of deep vein thrombosis in the lower extremity. No indirect evidence of obstruction proximal to the inguinal ligament. - There is no evidence of superficial venous thrombosis.  - No cystic structure found in the popliteal fossa.  LEFT: - No evidence of common femoral vein obstruction.  *See table(s) above for measurements and observations. Electronically signed by Lance Muss MD on 10/15/2022 at 10:13:18 PM.    Final    DG Knee AP/LAT W/Sunrise Right  Result Date: 10/15/2022 CLINICAL DATA:  Pain EXAM: RIGHT KNEE 3 VIEWS COMPARISON:  None Available. FINDINGS: Tricompartmental degenerative changes most marked in the medial compartment with significant loss of joint space medially. No fracture or dislocation. No joint effusion. No other acute abnormalities. IMPRESSION: Tricompartmental degenerative changes most marked in the medial compartment with significant loss of joint space medially. Electronically Signed   By: Gerome Sam III M.D.   On: 10/15/2022 13:30    Assessment & Plan:   Problem List Items Addressed This Visit     CFS (chronic fatigue syndrome) - Primary    Provigil, nuvigil - not covered; adderall is covered Will Rx Adderall - monitor HR  Potential benefits of a long term amphetamines  use as well as potential risks  and complications were explained to the patient and were aknowledged.       Hypothyroidism  FT4 was high - reduced Levothroid to 50 mc/d. Check TSH, FT4 in 4 weeks      Discoid lupus    Pool exercises      ADD (attention deficit disorder)    Provigil, nuvigil - not covered; adderall is covered Will Rx Adderall - monitor HR  Potential benefits of a long term amphetamines  use as well as potential risks  and complications were explained to the patient and were aknowledged.      Vitamin D deficiency    On  Rocaltrol      Low back pain    UDS - THC ?due to CBD cream use: quit. Repeat test was (-). Re-start Norco; monitor UDS On Norco to 7.5/325 mg  Potential benefits of a long term opioids use as well as potential risks (i.e. addiction risk, apnea etc) and complications (i.e. Somnolence, constipation and others) were explained to the patient and were aknowledged.      Class 3 severe obesity with serious comorbidity and body mass index (BMI) of 45.0 to 49.9 in adult [facility])    Starting [facility]      Anxiety disorder   B12 deficiency    On B12         No orders of the defined types were placed in this encounter.     Follow-up: No follow-ups on file.  Sonda Primes, MD

## 2022-11-19 NOTE — Assessment & Plan Note (Signed)
UDS - THC ?due to CBD cream use: quit. Repeat test was (-). Re-start Norco; monitor UDS Positive THC on the drug screen test came from CBD cream per patient On Norco to 7.5/325 mg  Potential benefits of a long term opioids use as well as potential risks (i.e. addiction risk, apnea etc) and complications (i.e. Somnolence, constipation and others) were explained to the patient and were aknowledged.

## 2022-11-24 ENCOUNTER — Ambulatory Visit (INDEPENDENT_AMBULATORY_CARE_PROVIDER_SITE_OTHER): Payer: Medicaid Other | Admitting: Family Medicine

## 2022-11-24 ENCOUNTER — Encounter (INDEPENDENT_AMBULATORY_CARE_PROVIDER_SITE_OTHER): Payer: Self-pay | Admitting: Family Medicine

## 2022-11-24 VITALS — BP 144/87 | HR 83 | Temp 98.0°F | Ht 63.0 in | Wt 266.0 lb

## 2022-11-24 DIAGNOSIS — R0602 Shortness of breath: Secondary | ICD-10-CM

## 2022-11-24 DIAGNOSIS — G4733 Obstructive sleep apnea (adult) (pediatric): Secondary | ICD-10-CM

## 2022-11-24 DIAGNOSIS — R5383 Other fatigue: Secondary | ICD-10-CM | POA: Diagnosis not present

## 2022-11-24 DIAGNOSIS — F32A Depression, unspecified: Secondary | ICD-10-CM | POA: Insufficient documentation

## 2022-11-24 DIAGNOSIS — R7303 Prediabetes: Secondary | ICD-10-CM | POA: Diagnosis not present

## 2022-11-24 DIAGNOSIS — M170 Bilateral primary osteoarthritis of knee: Secondary | ICD-10-CM

## 2022-11-24 DIAGNOSIS — F3289 Other specified depressive episodes: Secondary | ICD-10-CM

## 2022-11-24 DIAGNOSIS — E669 Obesity, unspecified: Secondary | ICD-10-CM

## 2022-11-24 DIAGNOSIS — E559 Vitamin D deficiency, unspecified: Secondary | ICD-10-CM | POA: Diagnosis not present

## 2022-11-24 DIAGNOSIS — Z1331 Encounter for screening for depression: Secondary | ICD-10-CM | POA: Insufficient documentation

## 2022-11-24 DIAGNOSIS — Z6841 Body Mass Index (BMI) 40.0 and over, adult: Secondary | ICD-10-CM | POA: Insufficient documentation

## 2022-11-25 LAB — COMPREHENSIVE METABOLIC PANEL
ALT: 20 IU/L (ref 0–32)
AST: 18 IU/L (ref 0–40)
Albumin/Globulin Ratio: 1.8 (ref 1.2–2.2)
Albumin: 4.2 g/dL (ref 3.8–4.9)
Alkaline Phosphatase: 100 IU/L (ref 44–121)
BUN/Creatinine Ratio: 27 — ABNORMAL HIGH (ref 9–23)
BUN: 25 mg/dL — ABNORMAL HIGH (ref 6–24)
Bilirubin Total: <0.2 mg/dL (ref 0.0–1.2)
CO2: 23 mmol/L (ref 20–29)
Calcium: 9.7 mg/dL (ref 8.7–10.2)
Chloride: 100 mmol/L (ref 96–106)
Creatinine, Ser: 0.94 mg/dL (ref 0.57–1.00)
Globulin, Total: 2.3 g/dL (ref 1.5–4.5)
Glucose: 75 mg/dL (ref 70–99)
Potassium: 4.9 mmol/L (ref 3.5–5.2)
Sodium: 141 mmol/L (ref 134–144)
Total Protein: 6.5 g/dL (ref 6.0–8.5)
eGFR: 70 mL/min/{1.73_m2} (ref >59)

## 2022-11-25 LAB — CBC WITH DIFFERENTIAL/PLATELET
Basophils Absolute: 0.1 10*3/uL (ref 0.0–0.2)
Basos: 1 %
EOS (ABSOLUTE): 0.1 10*3/uL (ref 0.0–0.4)
Eos: 2 %
Hematocrit: 45.1 % (ref 34.0–46.6)
Hemoglobin: 14.9 g/dL (ref 11.1–15.9)
Immature Grans (Abs): 0 10*3/uL (ref 0.0–0.1)
Immature Granulocytes: 0 %
Lymphocytes Absolute: 1.3 10*3/uL (ref 0.7–3.1)
Lymphs: 18 %
MCH: 31.4 pg (ref 26.6–33.0)
MCHC: 33 g/dL (ref 31.5–35.7)
MCV: 95 fL (ref 79–97)
Monocytes Absolute: 0.6 10*3/uL (ref 0.1–0.9)
Monocytes: 9 %
Neutrophils Absolute: 4.8 10*3/uL (ref 1.4–7.0)
Neutrophils: 70 %
Platelets: 332 10*3/uL (ref 150–450)
RBC: 4.75 x10E6/uL (ref 3.77–5.28)
RDW: 12.2 % (ref 11.7–15.4)
WBC: 6.8 10*3/uL (ref 3.4–10.8)

## 2022-11-25 LAB — VITAMIN B12: Vitamin B-12: 866 pg/mL (ref 232–1245)

## 2022-11-25 LAB — FOLATE: Folate: 6.6 ng/mL (ref >3.0)

## 2022-11-25 LAB — LIPID PANEL
Chol/HDL Ratio: 2.7 ratio (ref 0.0–4.4)
Cholesterol, Total: 186 mg/dL (ref 100–199)
HDL: 68 mg/dL (ref >39)
LDL Chol Calc (NIH): 104 mg/dL — ABNORMAL HIGH (ref 0–99)
Triglycerides: 74 mg/dL (ref 0–149)
VLDL Cholesterol Cal: 14 mg/dL (ref 5–40)

## 2022-11-25 LAB — VITAMIN D 25 HYDROXY (VIT D DEFICIENCY, FRACTURES): Vit D, 25-Hydroxy: 13.4 ng/mL — ABNORMAL LOW (ref 30.0–100.0)

## 2022-11-25 LAB — INSULIN, RANDOM: INSULIN: 18.2 u[IU]/mL (ref 2.6–24.9)

## 2022-11-25 LAB — HEMOGLOBIN A1C
Est. average glucose Bld gHb Est-mCnc: 126 mg/dL
Hgb A1c MFr Bld: 6 % — ABNORMAL HIGH (ref 4.8–5.6)

## 2022-11-30 ENCOUNTER — Ambulatory Visit (INDEPENDENT_AMBULATORY_CARE_PROVIDER_SITE_OTHER): Payer: PRIVATE HEALTH INSURANCE | Admitting: Family Medicine

## 2022-11-30 NOTE — Progress Notes (Signed)
Chief Complaint:   OBESITY Tiffany Medina (MR# [id number]) is a 57 y.o. female who presents for evaluation and treatment of obesity and related comorbidities. Current BMI is Body mass index is 47.12 kg/m. Tiffany Medina has been struggling with her weight for many years and has been unsuccessful in either losing weight, maintaining weight loss, or reaching her healthy weight goal.  Tiffany Medina fell last July and has lost her sister.  She is out of work.  She has gained about 20 lb. She would like to get a right total knee replacement.  She goes to the [facility] with a friend and does water exercise.  She used to work as a Engineer, civil (consulting).  She would like to be less than 200 LB.  Patient took phentermine with PCP.  Patient does not drink water.  Tiffany Medina is currently in the action stage of change and ready to dedicate time achieving and maintaining a healthier weight. Tiffany Medina is interested in becoming our patient and working on intensive lifestyle modifications including (but not limited to) diet and exercise for weight loss.  Tiffany Medina's habits were reviewed today and are as follows: Her family eats meals together, she thinks her family will eat healthier with her, her desired weight loss is 116 lbs, she has been heavy most of her life, she started gaining weight after child birth, her heaviest weight ever was 270 pounds, she has significant food cravings issues, she snacks frequently in the evenings, she wakes up frequently in the middle of the night to eat, she skips meals frequently, she is frequently drinking liquids with calories, she frequently makes poor food choices, she has problems with excessive hunger, and she struggles with emotional eating.  Depression Screen Tiffany Medina's Food and Mood (modified PHQ-9) score was 25.  Subjective:   1. Other fatigue Tiffany Medina admits to daytime somnolence and admits to waking up still tired. Patient has a history of symptoms of daytime fatigue, morning fatigue, and  morning headache. Tiffany Medina generally gets 4 or 5 hours of sleep per night, and states that she has nightime awakenings. Snoring is present. Apneic episodes are present. Epworth Sleepiness Score is 15.  EKG, NSR without ischemia.  2. SOBOE (shortness of breath on exertion) Tiffany Medina notes increasing shortness of breath with exercising and seems to be worsening over time with weight gain. She notes getting out of breath sooner with activity than she used to. This has not gotten worse recently. Tiffany Medina denies shortness of breath at rest or orthopnea.  3. OSA on CPAP Patient reports broken sleep at night.  Epworth score 15.  Patient gets about 5 hours of sleep at night.  4. Osteoarthritis of both knees, unspecified osteoarthritis type Patient has right and left knee pain which limits physical activity.  Patient would like to proceed with total knee replacement after BMI reduction.  5. Pre-diabetes Patient had GI upset from metformin in the past.  6. Vitamin D deficiency Patient last vitamin D level 14.88.  Patient is allergic to ergocalciferol.  Patient is on Calcitrol 0.5 mcg twice daily.  7. Other depression with emotional eating Bariatric PHQ-9: 25 (High).  Patient is manage for mood disorder.  Chronic pain and inability to work as worsened emotional eating.  Assessment/Plan:   1. Other fatigue Tiffany Medina does feel that her weight is causing her energy to be lower than it should be. Fatigue may be related to obesity, depression or many other causes. Labs will be ordered, and in the meanwhile, [name] will focus on self  care including making healthy food choices, increasing physical activity and focusing on stress reduction.  Update labs today.  - EKG 12-Lead - Folate - Comprehensive metabolic panel  2. SOBOE (shortness of breath on exertion) Tiffany Medina does feel that she gets out of breath more easily that she used to when she exercises. Tiffany Medina's shortness of breath appears to be obesity  related and exercise induced. She has agreed to work on weight loss and gradually increase exercise to treat her exercise induced shortness of breath. Will continue to monitor closely.  3. OSA on CPAP Continue CPAP nightly.  Aim for 7 to 8 hours of sleep at night.  4. Osteoarthritis of both knees, unspecified osteoarthritis type Continue water exercise 2 times a week.  Begin active plan for weight reduction.  5. Pre-diabetes Check labs today.  Begin prescribed meal plan.  - Lipid panel - Insulin, random - Hemoglobin A1c  6. Vitamin D deficiency Continue Calcitrol 0.5 mcg twice daily.  - VITAMIN D 25 Hydroxy (Vit-D Deficiency, Fractures)  7. Other depression with emotional eating Plan to add CBT with Dr. Dewaine Conger.  Write down short-term goals.  8. Depression screen Tiffany Medina had a positive depression screening. Depression is commonly associated with obesity and often results in emotional eating behaviors. We will monitor this closely and work on CBT to help improve the non-hunger eating patterns. Referral to Psychology may be required if no improvement is seen as she continues in our clinic.  9. BMI 45.0-49.9, adult (HCC)  10. Obesity, Starting BMI 47.3 - Vitamin B12 - CBC with Differential/Platelet  Tiffany Medina is currently in the action stage of change and her goal is to continue with weight loss efforts. I recommend Tiffany Medina begin the structured treatment plan as follows:  She has agreed to the Category 3 Plan.  100 calorie snack list was given.   Exercise goals: All adults should avoid inactivity. Some physical activity is better than none, and adults who participate in any amount of physical activity gain some health benefits.   Behavioral modification strategies: increasing lean protein intake, increasing vegetables, increasing water intake, decreasing liquid calories, decreasing eating out, no skipping meals, meal planning and cooking strategies, keeping healthy foods in the  home, better snacking choices, planning for success, and decreasing junk food.  She was informed of the importance of frequent follow-up visits to maximize her success with intensive lifestyle modifications for her multiple health conditions. She was informed we would discuss her lab results at her next visit unless there is a critical issue that needs to be addressed sooner. Tiffany Medina agreed to keep her next visit at the agreed upon time to discuss these results.  Objective:   Blood pressure (!) 144/87, pulse 83, temperature 98 F (36.7 C), height 5\' 3"  (1.6 m), weight 266 lb (120.7 kg), SpO2 96 %. Body mass index is 47.12 kg/m.  EKG: Normal sinus rhythm, rate 85 BPM.  Indirect Calorimeter completed today shows a VO2 of 304 and a REE of 2102.  Her calculated basal metabolic rate is 1610 thus her basal metabolic rate is better than expected.  General: Cooperative, alert, well developed, in no acute distress. HEENT: Conjunctivae and lids unremarkable. Cardiovascular: Regular rhythm.  Lungs: Normal work of breathing. Neurologic: No focal deficits.   Lab Results  Component Value Date   CREATININE 0.94 11/24/2022   BUN 25 (H) 11/24/2022   NA 141 11/24/2022   K 4.9 11/24/2022   CL 100 11/24/2022   CO2 23 11/24/2022   Lab Results  Component Value Date   ALT 20 11/24/2022   AST 18 11/24/2022   ALKPHOS 100 11/24/2022   BILITOT <0.2 11/24/2022   Lab Results  Component Value Date   HGBA1C 6.0 (H) 11/24/2022   HGBA1C 6.1 07/30/2022   HGBA1C 6.0 02/01/2022   HGBA1C 6.0 04/20/2021   HGBA1C 5.9 02/15/2020   Lab Results  Component Value Date   INSULIN 18.2 11/24/2022   INSULIN 12.3 02/02/2018   INSULIN 11.5 08/08/2017   INSULIN 23.7 04/11/2017   Lab Results  Component Value Date   TSH 1.47 09/29/2022   Lab Results  Component Value Date   CHOL 186 11/24/2022   HDL 68 11/24/2022   LDLCALC 104 (H) 11/24/2022   TRIG 74 11/24/2022   CHOLHDL 2.7 11/24/2022   Lab Results   Component Value Date   WBC 6.8 11/24/2022   HGB 14.9 11/24/2022   HCT 45.1 11/24/2022   MCV 95 11/24/2022   PLT 332 11/24/2022   No results found for: "IRON", "TIBC", "FERRITIN"  Attestation Statements:   Reviewed by clinician on day of visit: allergies, medications, problem list, medical history, surgical history, family history, social history, and previous encounter notes.  Time spent on visit including pre-visit chart review and post-visit charting and care was 45 minutes.   Marily Lente, am acting as transcriptionist for Seymour Bars, DO.  Seymour Bars, DO

## 2022-12-07 ENCOUNTER — Ambulatory Visit: Payer: Medicaid Other | Admitting: Internal Medicine

## 2022-12-14 ENCOUNTER — Ambulatory Visit (INDEPENDENT_AMBULATORY_CARE_PROVIDER_SITE_OTHER): Payer: PRIVATE HEALTH INSURANCE | Admitting: Family Medicine

## 2022-12-19 ENCOUNTER — Other Ambulatory Visit: Payer: Self-pay | Admitting: Internal Medicine

## 2022-12-19 ENCOUNTER — Encounter: Payer: Self-pay | Admitting: Internal Medicine

## 2022-12-22 ENCOUNTER — Other Ambulatory Visit: Payer: Self-pay | Admitting: Internal Medicine

## 2022-12-22 MED ORDER — HYDROCODONE-ACETAMINOPHEN 7.5-325 MG PO TABS: 1.0000 | ORAL_TABLET | Freq: Three times a day (TID) | ORAL | 0 refills | Status: DC | PRN

## 2022-12-22 MED ORDER — AMPHETAMINE-DEXTROAMPHETAMINE 5 MG PO TABS: ORAL_TABLET | ORAL | 0 refills | Status: DC

## 2022-12-27 ENCOUNTER — Ambulatory Visit: Payer: Medicaid Other | Admitting: Internal Medicine

## 2022-12-29 ENCOUNTER — Ambulatory Visit (INDEPENDENT_AMBULATORY_CARE_PROVIDER_SITE_OTHER): Payer: Medicaid Other | Admitting: Family Medicine

## 2022-12-29 ENCOUNTER — Encounter (INDEPENDENT_AMBULATORY_CARE_PROVIDER_SITE_OTHER): Payer: Self-pay | Admitting: Family Medicine

## 2022-12-29 VITALS — BP 139/83 | HR 95 | Temp 98.3°F | Ht 63.0 in | Wt 269.0 lb

## 2022-12-29 DIAGNOSIS — F3289 Other specified depressive episodes: Secondary | ICD-10-CM

## 2022-12-29 DIAGNOSIS — E88819 Insulin resistance, unspecified: Secondary | ICD-10-CM | POA: Insufficient documentation

## 2022-12-29 DIAGNOSIS — E559 Vitamin D deficiency, unspecified: Secondary | ICD-10-CM | POA: Diagnosis not present

## 2022-12-29 DIAGNOSIS — M170 Bilateral primary osteoarthritis of knee: Secondary | ICD-10-CM

## 2022-12-29 DIAGNOSIS — E538 Deficiency of other specified B group vitamins: Secondary | ICD-10-CM

## 2022-12-29 DIAGNOSIS — R7303 Prediabetes: Secondary | ICD-10-CM

## 2022-12-29 DIAGNOSIS — Z6841 Body Mass Index (BMI) 40.0 and over, adult: Secondary | ICD-10-CM

## 2022-12-29 MED ORDER — SEMAGLUTIDE(0.25 OR 0.5MG/DOS) 2 MG/3ML ~~LOC~~ SOPN: 0.2500 mg | PEN_INJECTOR | SUBCUTANEOUS | 0 refills | Status: DC
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
## 2022-12-29 NOTE — Assessment & Plan Note (Signed)
Last vitamin D Lab Results  Component Value Date   VD25OH 13.4 (L) 11/24/2022   Currently on calcitriol 0.5 mcg twice daily by PCP.  History of vitamin D allergy. Complains of fatigue  She will share these results with Dr. Posey Rea or her upcoming follow-up visit.

## 2022-12-29 NOTE — Assessment & Plan Note (Signed)
Reviewed labs together.  Fasting insulin elevated at 18.2.  She has findings of hyperinsulinemia including cravings and hyperphasia. We discussed the importance of reducing intake of added sugar and refined carbohydrates, adding regular physical activity to improve insulin sensitivity and working on weight reduction in general.  She has had intolerance to metformin in the past.  She has had good response to Ozempic in the past without adverse side effect.  Resume prescribed meal plan.  Begin chair exercises for physical activity.  Begin Ozempic 0.25 mg once weekly injection.  We reviewed mechanism of action and potential for side effects.  She denies a personal or family history for pancreatitis, multiple endocrine neoplasia type II or medullary thyroid carcinoma.

## 2022-12-29 NOTE — Assessment & Plan Note (Signed)
Left greater than right bilateral DJD pain in knees has limited her physical activity level.  This has hindered her weight loss over time.  She is actively working on BMI reduction to have left than right total knee replacement surgery.  She was seen by Dr. Sharmon Leyden for ortho and Dr. Denyse Amass for sports management, knee injections.  Continue active plan for BMI reduction to have bilateral knee replacement surgery.  Plan to increase physical activity level following physical therapy.  In the meantime, she may resort to home chair exercises on YouTube.

## 2022-12-29 NOTE — Assessment & Plan Note (Signed)
Reviewed B12 levels on recent labs.  Her B12 level has improved on vitamin B12 injections q. 14 days with PCP. Her energy level remains poor.

## 2022-12-29 NOTE — Assessment & Plan Note (Signed)
Lab Results  Component Value Date   HGBA1C 6.0 (H) 11/24/2022   Reviewed lab from last visit.  Her A1c is 6 in the prediabetic range.  She has a long history of prediabetes with poor dietary compliance and lack of regular exercise.  She is motivated to continue working on a reduced calorie diet low in sugar and refined carbohydrates while focusing on lean protein and fiber with meals as prescribed.  Recheck A1c in the next 6 months.  Look for improvements with the introduction of Ozempic for insulin resistance.
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
## 2022-12-30 ENCOUNTER — Ambulatory Visit (INDEPENDENT_AMBULATORY_CARE_PROVIDER_SITE_OTHER): Payer: PRIVATE HEALTH INSURANCE | Admitting: Family Medicine

## 2023-01-01 IMAGING — DX DG LUMBAR SPINE 2-3V
3 series · 3 of 3 positions shown · non-contrast
Comparison: Lumbar spine x-ray 10/03/2020.

CLINICAL DATA: Low back pain.

EXAM:
LUMBAR SPINE - 2-3 VIEW

[l-spine ap]
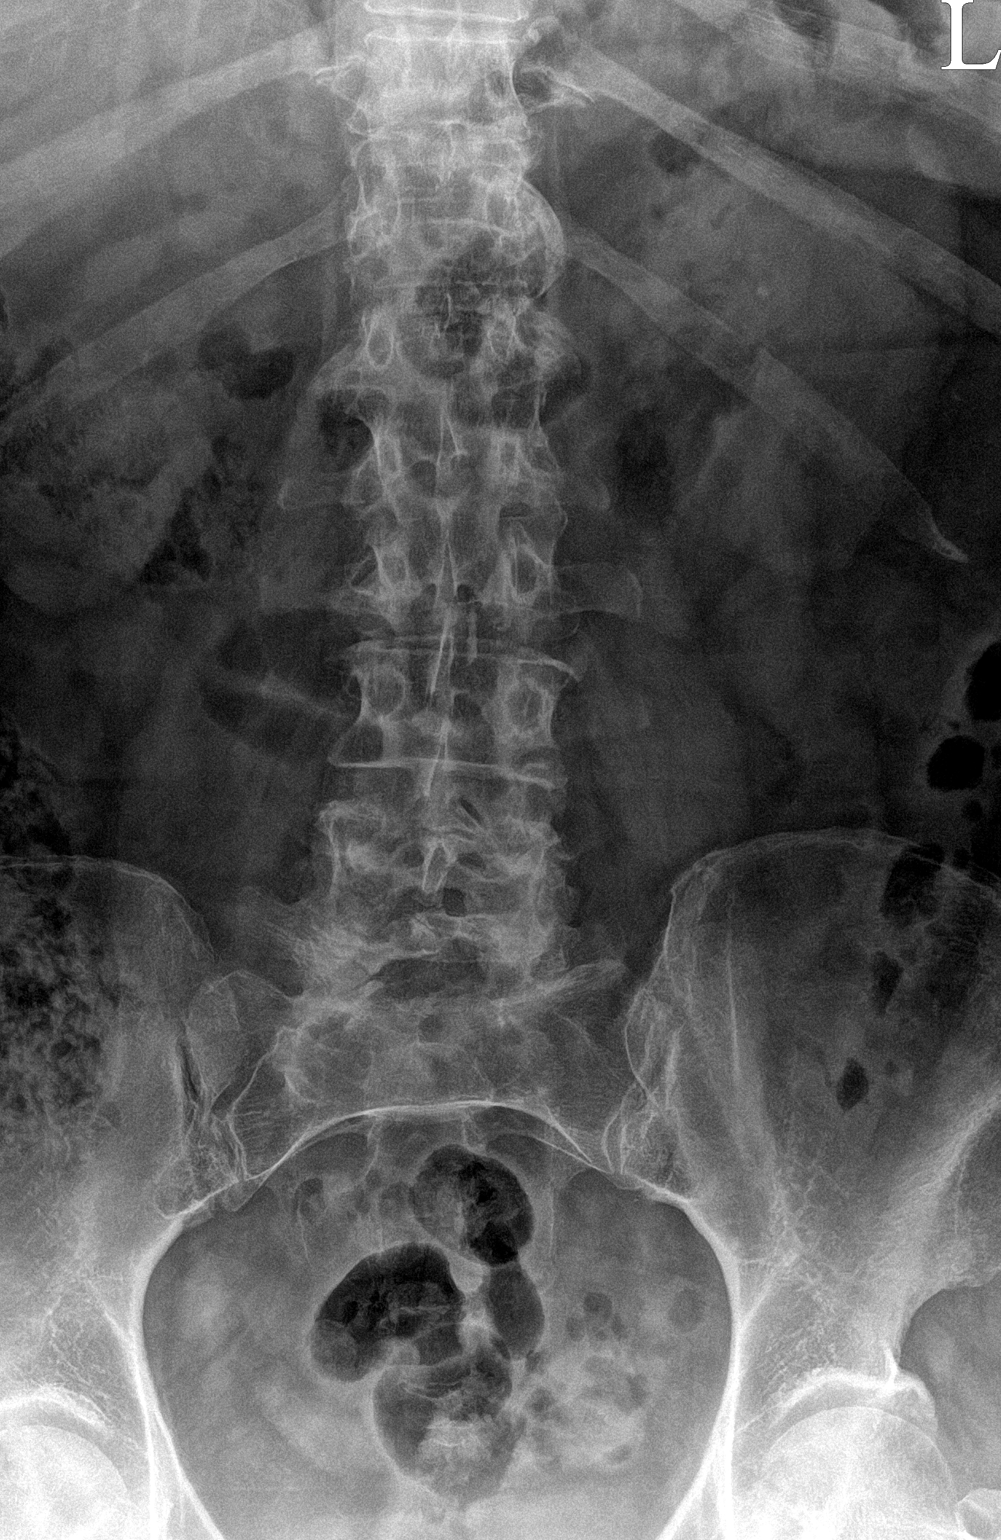

[l-spine lateral]
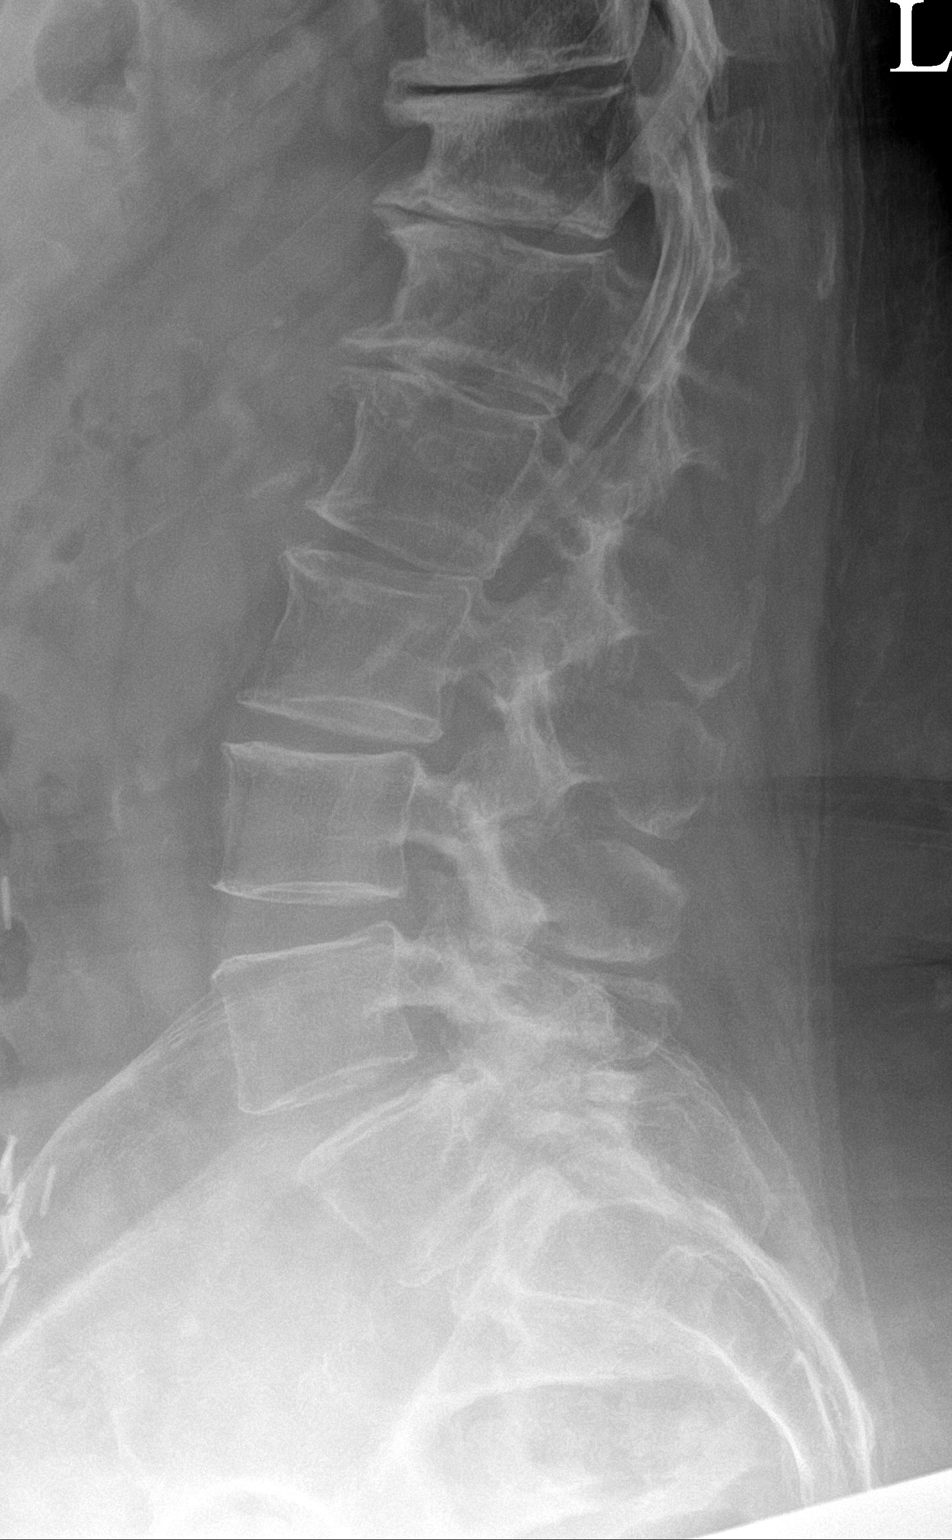

[l-spine spot]
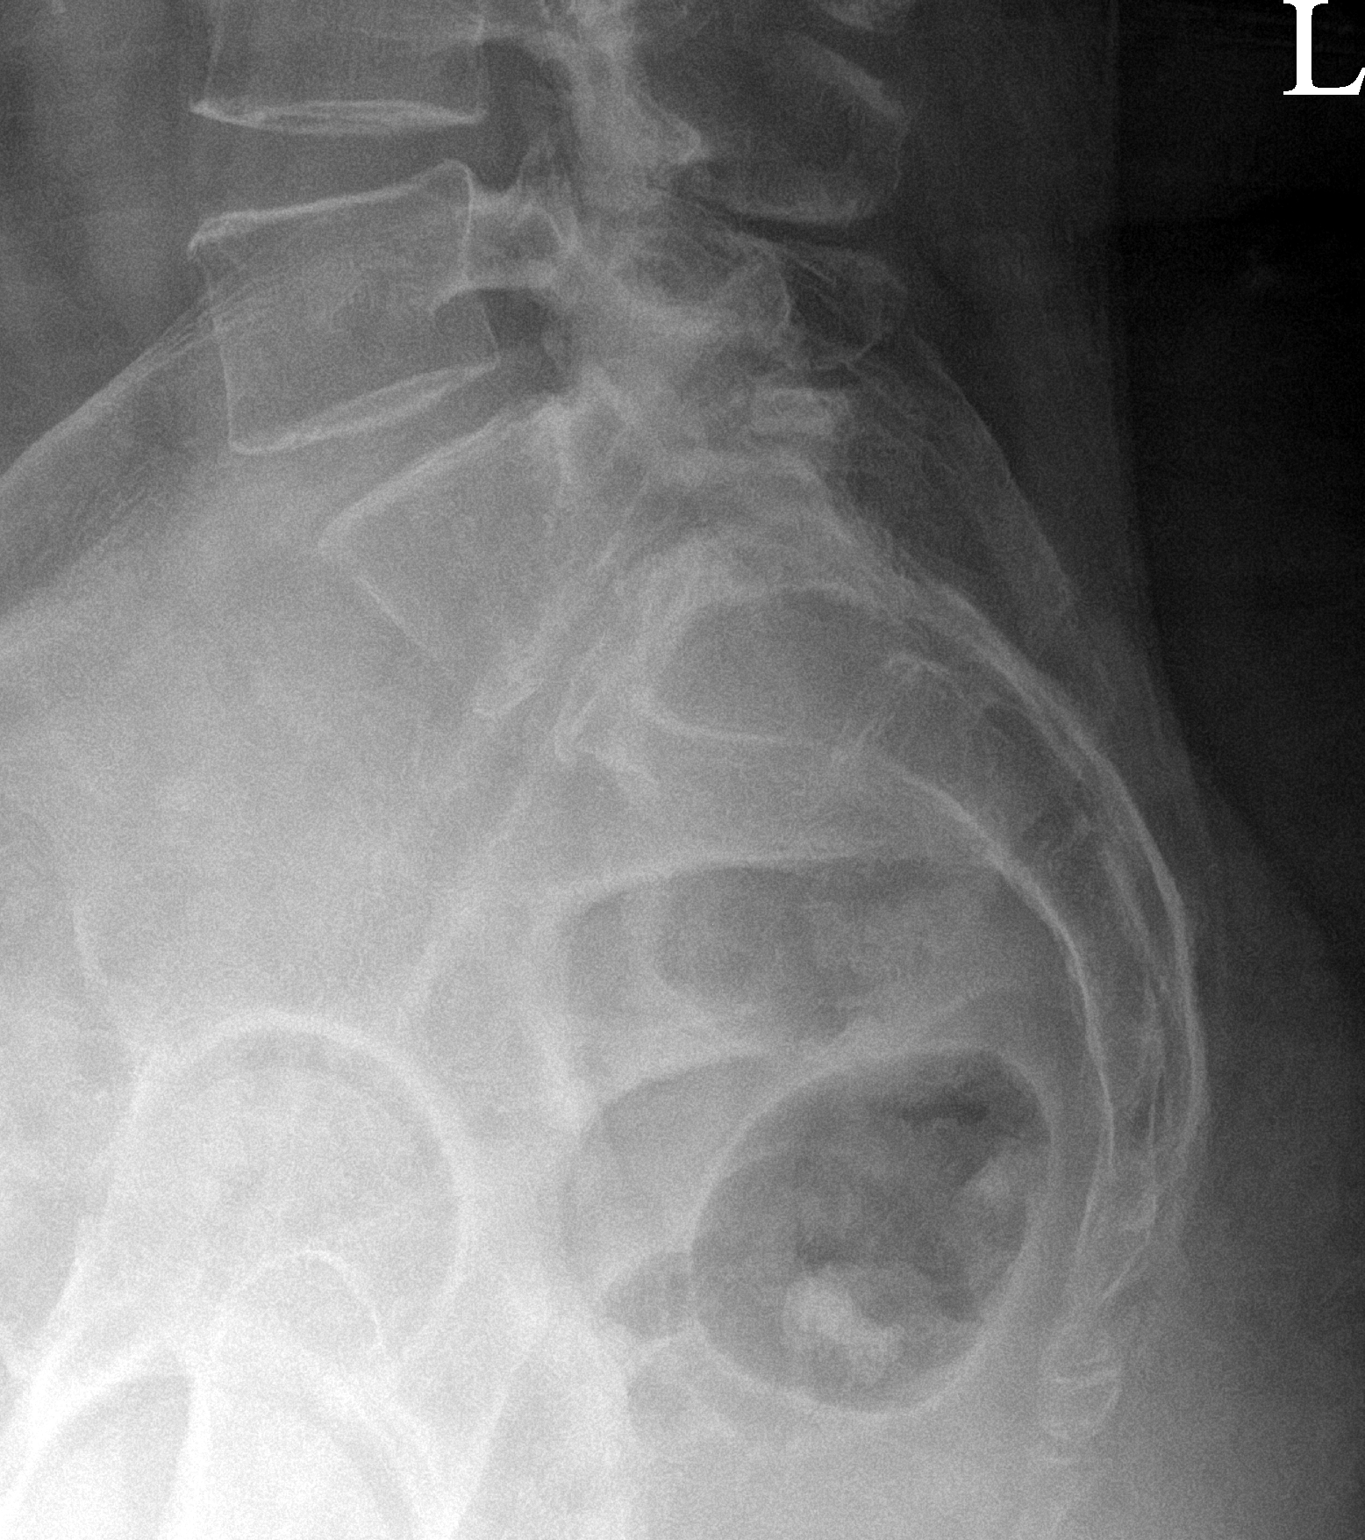

[3 of 3 positions shown; findings below may reference images not displayed]

FINDINGS: There is no evidence of lumbar spine fracture. Alignment is normal.
There is mild disc space narrowing at L5-S1. There is also moderate
disc space narrowing and endplate osteophyte formation of the
visualized lower thoracic spine. There is mild disc space narrowing
at L1-L2. Scattered degenerative endplate osteophytes are present.
Soft tissues are within normal limits.
IMPRESSION: 1. Mild degenerative changes at L1-L2 and L5-S1.
2. No acute fracture.

## 2023-01-03 ENCOUNTER — Telehealth (INDEPENDENT_AMBULATORY_CARE_PROVIDER_SITE_OTHER): Payer: Self-pay | Admitting: Family Medicine

## 2023-01-03 NOTE — Telephone Encounter (Signed)
Prior authorization done via cover my meds for patients Ozempic. Waiting on determination.  

## 2023-01-04 ENCOUNTER — Encounter: Payer: Self-pay | Admitting: Internal Medicine

## 2023-01-04 ENCOUNTER — Ambulatory Visit: Payer: Medicaid Other | Admitting: Internal Medicine

## 2023-01-04 VITALS — BP 140/90 | HR 90 | Temp 98.6°F | Ht 63.0 in | Wt 268.0 lb

## 2023-01-04 DIAGNOSIS — E038 Other specified hypothyroidism: Secondary | ICD-10-CM | POA: Diagnosis not present

## 2023-01-04 DIAGNOSIS — G9332 Myalgic encephalomyelitis/chronic fatigue syndrome: Secondary | ICD-10-CM

## 2023-01-04 DIAGNOSIS — Z6841 Body Mass Index (BMI) 40.0 and over, adult: Secondary | ICD-10-CM

## 2023-01-04 DIAGNOSIS — E559 Vitamin D deficiency, unspecified: Secondary | ICD-10-CM

## 2023-01-04 DIAGNOSIS — R7303 Prediabetes: Secondary | ICD-10-CM

## 2023-01-04 DIAGNOSIS — F17200 Nicotine dependence, unspecified, uncomplicated: Secondary | ICD-10-CM

## 2023-01-04 DIAGNOSIS — F419 Anxiety disorder, unspecified: Secondary | ICD-10-CM

## 2023-01-04 MED ORDER — AMPHETAMINE-DEXTROAMPHETAMINE 5 MG PO TABS: ORAL_TABLET | ORAL | 0 refills | Status: DC

## 2023-01-04 MED ORDER — HYDROCODONE-ACETAMINOPHEN 7.5-325 MG PO TABS: 1.0000 | ORAL_TABLET | Freq: Three times a day (TID) | ORAL | 0 refills | Status: DC | PRN

## 2023-01-04 NOTE — Assessment & Plan Note (Signed)
Stress reduction

## 2023-01-04 NOTE — Assessment & Plan Note (Signed)
Xanax as needed  Potential benefits of a long term benzodiazepines  use as well as potential risks  and complications were explained to the patient and were aknowledged. 

## 2023-01-04 NOTE — Progress Notes (Signed)
Subjective:  Patient ID: Tiffany Medina, female    DOB: [birth year]  Age: 57 y.o. MRN: [id number]  CC: Follow-up (4 week f/u)   HPI Tiffany Medina presents for wt gain, LBP, lupus C/o marital stress - separated now  Separated w/restraint order   Outpatient Medications Prior to Visit  Medication Sig Dispense Refill   albuterol (PROVENTIL HFA) 108 (90 Base) MCG/ACT inhaler Inhale 2 puffs into the lungs every 4 (four) hours as needed for wheezing or shortness of breath. 8.5 g 11   ALPRAZolam (XANAX) 0.5 MG tablet TAKE 3 TABLETS BY MOUTH EVERY DAY AT BEDTIME AS NEEDED 90 tablet 3   amphetamine-dextroamphetamine (ADDERALL) 5 MG tablet 5 mg in am, 5 mg at lunch 60 tablet 0   aspirin (GOODSENSE ASPIRIN) 325 MG tablet Take 1 tablet (325 mg total) by mouth daily. 100 tablet 3   buPROPion (WELLBUTRIN SR) 200 MG 12 hr tablet Take 1 tablet (200 mg total) by mouth daily. 90 tablet 3   calcitRIOL (ROCALTROL) 0.5 MCG capsule Take 1 capsule (0.5 mcg total) by mouth in the morning and at bedtime. 60 capsule 11   cyanocobalamin (VITAMIN B12) 1000 MCG/ML injection Inject 1 mL (1,000 mcg total) into the muscle every 14 (fourteen) days. 6 mL 3   cyclobenzaprine (FLEXERIL) 10 MG tablet TAKE 1 TABLET 3 TIMES A DAY AS NEEDED 90 tablet 1   diphenoxylate-atropine (LOMOTIL) 2.5-0.025 MG tablet Take 1 tablet by mouth 4 (four) times daily as needed for diarrhea or loose stools. 60 tablet 1   HYDROcodone-acetaminophen (NORCO) 7.5-325 MG tablet Take 1 tablet by mouth every 8 (eight) hours as needed for severe pain. 90 tablet 0   HYDROcodone-acetaminophen (NORCO) 7.5-325 MG tablet Take 1 tablet by mouth 3 (three) times daily as needed for severe pain. 90 tablet 0   ibuprofen (ADVIL) 800 MG tablet Take 1 tablet (800 mg total) by mouth every 8 (eight) hours as needed. 90 tablet 2   levothyroxine (SYNTHROID) 100 MCG tablet TAKE 1 TABLET BY MOUTH ONCE DAILY BEFORE BREAKFAST (Patient taking differently: TAKE 1/2 TABLET BY  MOUTH ONCE DAILY BEFORE BREAKFAST ( )) 90 tablet 3   ondansetron (ZOFRAN) 4 MG tablet Take 1 tablet (4 mg total) by mouth every 8 (eight) hours as needed for nausea or vomiting. 20 tablet 1   pantoprazole (PROTONIX) 40 MG tablet TAKE 1 TABLET TWICE DAILY 180 tablet 1   Semaglutide,0.25 or 0.5MG /DOS, 2 MG/3ML SOPN Inject 0.25 mg into the skin once a week. 3 mL 0   Armodafinil 200 MG TABS Take 1 tablet by mouth daily. (Patient not taking: Reported on 11/24/2022) 90 tablet 1   No facility-administered medications prior to visit.    ROS: Review of Systems  Constitutional:  Positive for fatigue and unexpected weight change. Negative for activity change, appetite change and chills.  HENT:  Negative for congestion, mouth sores and sinus pressure.   Eyes:  Negative for visual disturbance.  Respiratory:  Negative for cough and chest tightness.   Gastrointestinal:  Negative for abdominal pain and nausea.  Genitourinary:  Negative for difficulty urinating, frequency and vaginal pain.  Musculoskeletal:  Positive for arthralgias, back pain and gait problem.  Skin:  Negative for pallor and rash.  Neurological:  Negative for dizziness, tremors, numbness and headaches.  Psychiatric/Behavioral:  Positive for dysphoric mood and sleep disturbance. Negative for confusion, decreased concentration and suicidal ideas. The patient is nervous/anxious.     Objective:  BP (!) 140/90   Pulse 90  Temp 98.6 F (37 C) (Oral)   Ht 5\' 3"  (1.6 m)   Wt 268 lb (121.6 kg)   SpO2 96%   BMI 47.47 kg/m   BP Readings from Last 3 Encounters:  01/04/23 (!) 140/90  12/29/22 139/83  11/24/22 (!) 144/87    Wt Readings from Last 3 Encounters:  01/04/23 268 lb (121.6 kg)  12/29/22 269 lb (122 kg)  11/24/22 266 lb (120.7 kg)    Physical Exam Constitutional:      General: She is not in acute distress.    Appearance: She is well-developed. She is obese.  HENT:     Head: Normocephalic.     Right Ear: External  ear normal.     Left Ear: External ear normal.     Nose: Nose normal.  Eyes:     General:        Right eye: No discharge.        Left eye: No discharge.     Conjunctiva/sclera: Conjunctivae normal.     Pupils: Pupils are equal, round, and reactive to light.  Neck:     Thyroid: No thyromegaly.     Vascular: No JVD.     Trachea: No tracheal deviation.  Cardiovascular:     Rate and Rhythm: Normal rate and regular rhythm.     Heart sounds: Normal heart sounds.  Pulmonary:     Effort: No respiratory distress.     Breath sounds: No stridor. No wheezing.  Abdominal:     General: Bowel sounds are normal. There is no distension.     Palpations: Abdomen is soft. There is no mass.     Tenderness: There is no abdominal tenderness. There is no guarding or rebound.  Musculoskeletal:        General: Tenderness present.     Cervical back: Normal range of motion and neck supple. No rigidity.     Right lower leg: Edema present.     Left lower leg: Edema present.  Lymphadenopathy:     Cervical: No cervical adenopathy.  Skin:    Findings: No erythema or rash.  Neurological:     Mental Status: She is oriented to person, place, and time.     Cranial Nerves: No cranial nerve deficit.     Motor: No abnormal muscle tone.     Coordination: Coordination normal.     Gait: Gait abnormal.     Deep Tendon Reflexes: Reflexes normal.  Psychiatric:        Behavior: Behavior normal.        Thought Content: Thought content normal.        Judgment: Judgment normal.   Antalgic gait LS, knees w/pain  Lab Results  Component Value Date   WBC 6.8 11/24/2022   HGB 14.9 11/24/2022   HCT 45.1 11/24/2022   PLT 332 11/24/2022   GLUCOSE 75 11/24/2022   CHOL 186 11/24/2022   TRIG 74 11/24/2022   HDL 68 11/24/2022   LDLCALC 104 (H) 11/24/2022   ALT 20 11/24/2022   AST 18 11/24/2022   NA 141 11/24/2022   K 4.9 11/24/2022   CL 100 11/24/2022   CREATININE 0.94 11/24/2022   BUN 25 (H) 11/24/2022   CO2 23  11/24/2022   TSH 1.47 09/29/2022   INR 1.02 11/28/2014   HGBA1C 6.0 (H) 11/24/2022    [place] LIMITED JOINT SPACE STRUCTURES LOW RIGHT(NO LINKED CHARGES)  Result Date: 10/21/2022 Procedure: Real-time Ultrasound Guided Injection of right knee joint superior lateral patellar space  Device: Philips Affiniti 50G Images permanently stored and available for review in PACS Mild effusion is present in the superior patellar space.  No Baker's cyst is visible. Verbal informed consent obtained.  Discussed risks and benefits of procedure. Warned about infection, bleeding, hyperglycemia damage to structures among others. Patient expresses understanding and agreement Time-out conducted.  Noted no overlying erythema, induration, or other signs of local infection.  Skin prepped in a sterile fashion.  Local anesthesia: Topical Ethyl chloride.  With sterile technique and under real time ultrasound guidance: 40 mg of Kenalog and 2 mL Marcaine injected into knee joint. Fluid seen entering the joint capsule.  Completed without difficulty  Pain immediately resolved suggesting accurate placement of the medication.  Advised to call if fevers/chills, erythema, induration, drainage, or persistent bleeding.  Images permanently stored and available for review in the ultrasound unit. Impression: Technically successful ultrasound guided injection.    VAS [place] LOWER EXTREMITY VENOUS (DVT)  Result Date: 10/15/2022  Lower Venous DVT Study Patient Name:  GREENLY LLERA  Date of Exam:   10/15/2022 Medical Rec #: [id number]       Accession #:    [id number] Date of Birth: [birth year]       Patient Gender: F Patient Age:   3 years Exam Location:  [facility] Procedure:      VAS [place] LOWER EXTREMITY VENOUS (DVT) Referring Phys: EVAN COREY --------------------------------------------------------------------------------  Indications: Pain, and Edema.  Risk Factors: DVT History left lower extremity DVT >20 years ago, and several episodes of superficial  thromboses. obesity and smoker. Anticoagulation: ASA 325 mg/day. Performing Technologist: [employer] BS, RVT, RDCS  Examination Guidelines: A complete evaluation includes B-mode imaging, spectral Doppler, color Doppler, and power Doppler as needed of all accessible portions of each vessel. Bilateral testing is considered an integral part of a complete examination. Limited examinations for reoccurring indications may be performed as noted. The reflux portion of the exam is performed with the patient in reverse Trendelenburg.  +---------+---------------+---------+-----------+----------+--------------+ RIGHT    CompressibilityPhasicitySpontaneityPropertiesThrombus Aging +---------+---------------+---------+-----------+----------+--------------+ CFV      Full           Yes      Yes                                 +---------+---------------+---------+-----------+----------+--------------+ SFJ      Full           Yes      Yes                                 +---------+---------------+---------+-----------+----------+--------------+ FV Prox  Full           Yes      Yes                                 +---------+---------------+---------+-----------+----------+--------------+ FV Mid   Full           Yes      Yes                                 +---------+---------------+---------+-----------+----------+--------------+ FV DistalFull           Yes      Yes                                 +---------+---------------+---------+-----------+----------+--------------+  PFV      Full           Yes      Yes                                 +---------+---------------+---------+-----------+----------+--------------+ POP      Full           Yes      Yes                                 +---------+---------------+---------+-----------+----------+--------------+ PTV      Full           No       Yes                                  +---------+---------------+---------+-----------+----------+--------------+ PERO     Full           No       Yes                                 +---------+---------------+---------+-----------+----------+--------------+ Gastroc  Full                                                        +---------+---------------+---------+-----------+----------+--------------+ GSV      Full                                                        +---------+---------------+---------+-----------+----------+--------------+   +----+---------------+---------+-----------+----------+--------------+ LEFTCompressibilityPhasicitySpontaneityPropertiesThrombus Aging +----+---------------+---------+-----------+----------+--------------+ CFV Full           Yes      Yes                                 +----+---------------+---------+-----------+----------+--------------+    Findings reported to Dr. Denyse Amass at 12:15PM.  Summary: RIGHT: - No evidence of deep vein thrombosis in the lower extremity. No indirect evidence of obstruction proximal to the inguinal ligament. - There is no evidence of superficial venous thrombosis.  - No cystic structure found in the popliteal fossa.  LEFT: - No evidence of common femoral vein obstruction.  *See table(s) above for measurements and observations. Electronically signed by Lance Muss MD on 10/15/2022 at 10:13:18 PM.    Final    DG Knee AP/LAT W/Sunrise Right  Result Date: 10/15/2022 CLINICAL DATA:  Pain EXAM: RIGHT KNEE 3 VIEWS COMPARISON:  None Available. FINDINGS: Tricompartmental degenerative changes most marked in the medial compartment with significant loss of joint space medially. No fracture or dislocation. No joint effusion. No other acute abnormalities. IMPRESSION: Tricompartmental degenerative changes most marked in the medial compartment with significant loss of joint space medially. Electronically Signed   By: Gerome Sam III M.D.   On: 10/15/2022  13:30    Assessment & Plan:   Problem List Items Addressed This Visit  CFS (chronic fatigue syndrome) - Primary    Stress reduction       Hypothyroidism    Chronic  Cont on Levothroid 100 mcg/d      Vitamin D deficiency    On Vit D      Class 3 severe obesity with serious comorbidity and body mass index (BMI) of 45.0 to 49.9 in adult [facility])    Off Phentermine  Potential benefits of a long term phentermine  use as well as potential risks  and complications were explained to the patient and were aknowledged. Reginal Lutes is not covered Started with [facility]      Anxiety disorder    Xanax as needed  Potential benefits of a long term benzodiazepines  use as well as potential risks  and complications were explained to the patient and were aknowledged.      Pre-diabetes    Lab Results  Component Value Date   HGBA1C 6.0 (H) 11/24/2022           No orders of the defined types were placed in this encounter.     Follow-up: No follow-ups on file.  Sonda Primes, MD

## 2023-01-04 NOTE — Assessment & Plan Note (Signed)
On Vit D 

## 2023-01-04 NOTE — Assessment & Plan Note (Signed)
Off Phentermine  Potential benefits of a long term phentermine  use as well as potential risks  and complications were explained to the patient and were aknowledged. Reginal Lutes is not covered Started with [facility]

## 2023-01-04 NOTE — Assessment & Plan Note (Signed)
Chronic  Cont on Levothroid 100 mcg/d

## 2023-01-04 NOTE — Assessment & Plan Note (Signed)
Chronic  Discussed  

## 2023-01-04 NOTE — Assessment & Plan Note (Signed)
Lab Results  Component Value Date   HGBA1C 6.0 (H) 11/24/2022

## 2023-01-05 NOTE — Telephone Encounter (Signed)
Prior authorization denied for patients Ozempic.  

## 2023-01-07 ENCOUNTER — Ambulatory Visit: Payer: Medicaid Other | Admitting: Internal Medicine

## 2023-01-07 ENCOUNTER — Encounter: Payer: Self-pay | Admitting: Internal Medicine

## 2023-01-07 VITALS — BP 126/74 | HR 95 | Temp 98.2°F | Ht 63.0 in | Wt 272.0 lb

## 2023-01-07 DIAGNOSIS — R202 Paresthesia of skin: Secondary | ICD-10-CM

## 2023-01-07 DIAGNOSIS — R600 Localized edema: Secondary | ICD-10-CM

## 2023-01-07 DIAGNOSIS — R200 Anesthesia of skin: Secondary | ICD-10-CM

## 2023-01-07 NOTE — Progress Notes (Signed)
Subjective:    Patient ID: Tiffany Medina, female    DOB: 1966/06/10, 57 y.o.   MRN: [id number]      HPI Tiffany Medina is here for  Chief Complaint  Patient presents with   Edema    Numbness on left leg more than right; left leg has gotten worse    Has numbness and swelling in her left leg for years - worse after injury 2 years ago.  H/o LLE DVT 4 years ago.   Has a history of chronic venous insufficiency.   She has chronic left knee pain.  She has chronic back pain and issues.  She is on pain medication.  She has had several injections in both of her knees and her back.  She needs a knee replacement, but needs to lose weight first.  Bilateral leg swelling, numbness- she was here three days ago and saw her PCP.  Her legs were swollen then, but at her baseline.  Her right leg is still at its baseline, but her left ankle and foot are more swollen.  She also states worsening numbness in her left foot.  The numbness is more chronic, but there has been any acute worsening.    She denies any worsening of her back pain or leg pain.  Elevates legs when sitting.  She has not consumed more sodium that usual.       Medications and allergies reviewed with patient and updated if appropriate.  Current Outpatient Medications on File Prior to Visit  Medication Sig Dispense Refill   albuterol (PROVENTIL HFA) 108 (90 Base) MCG/ACT inhaler Inhale 2 puffs into the lungs every 4 (four) hours as needed for wheezing or shortness of breath. 8.5 g 11   ALPRAZolam (XANAX) 0.5 MG tablet TAKE 3 TABLETS BY MOUTH EVERY DAY AT BEDTIME AS NEEDED 90 tablet 3   amphetamine-dextroamphetamine (ADDERALL) 5 MG tablet 5 mg in am, 5 mg at lunch 60 tablet 0   aspirin (GOODSENSE ASPIRIN) 325 MG tablet Take 1 tablet (325 mg total) by mouth daily. 100 tablet 3   buPROPion (WELLBUTRIN SR) 200 MG 12 hr tablet Take 1 tablet (200 mg total) by mouth daily. 90 tablet 3   calcitRIOL (ROCALTROL) 0.5 MCG capsule Take 1 capsule (0.5  mcg total) by mouth in the morning and at bedtime. 60 capsule 11   cyanocobalamin (VITAMIN B12) 1000 MCG/ML injection Inject 1 mL (1,000 mcg total) into the muscle every 14 (fourteen) days. 6 mL 3   cyclobenzaprine (FLEXERIL) 10 MG tablet TAKE 1 TABLET 3 TIMES A DAY AS NEEDED 90 tablet 1   diphenoxylate-atropine (LOMOTIL) 2.5-0.025 MG tablet Take 1 tablet by mouth 4 (four) times daily as needed for diarrhea or loose stools. 60 tablet 1   HYDROcodone-acetaminophen (NORCO) 7.5-325 MG tablet Take 1 tablet by mouth 3 (three) times daily as needed for severe pain. 90 tablet 0   HYDROcodone-acetaminophen (NORCO) 7.5-325 MG tablet Take 1 tablet by mouth every 8 (eight) hours as needed for severe pain. 90 tablet 0   ibuprofen (ADVIL) 800 MG tablet Take 1 tablet (800 mg total) by mouth every 8 (eight) hours as needed. 90 tablet 2   levothyroxine (SYNTHROID) 100 MCG tablet TAKE 1 TABLET BY MOUTH ONCE DAILY BEFORE BREAKFAST (Patient taking differently: TAKE 1/2 TABLET BY MOUTH ONCE DAILY BEFORE BREAKFAST ( )) 90 tablet 3   ondansetron (ZOFRAN) 4 MG tablet Take 1 tablet (4 mg total) by mouth every 8 (eight) hours as needed for nausea or vomiting.  20 tablet 1   pantoprazole (PROTONIX) 40 MG tablet TAKE 1 TABLET TWICE DAILY 180 tablet 1   Semaglutide,0.25 or 0.5MG /DOS, 2 MG/3ML SOPN Inject 0.25 mg into the skin once a week. 3 mL 0   No current facility-administered medications on file prior to visit.    Review of Systems  Constitutional:  Negative for fever.  HENT:  Positive for voice change.   Respiratory:  Positive for cough (chronic) and wheezing (chronic). Negative for shortness of breath.   Cardiovascular:  Positive for leg swelling. Negative for chest pain and palpitations.       Objective:   Vitals:   01/07/23 1540  BP: 126/74  Pulse: 95  Temp: 98.2 F (36.8 C)  SpO2: 97%   BP Readings from Last 3 Encounters:  01/07/23 126/74  01/04/23 (!) 140/90  12/29/22 139/83   Wt Readings from  Last 3 Encounters:  01/07/23 272 lb (123.4 kg)  01/04/23 268 lb (121.6 kg)  12/29/22 269 lb (122 kg)   Body mass index is 48.18 kg/m.    Physical Exam Constitutional:      General: She is not in acute distress.    Appearance: Normal appearance. She is not ill-appearing.  HENT:     Head: Normocephalic and atraumatic.  Pulmonary:     Comments: Bilateral lower extremity varicose veins Musculoskeletal:     Right lower leg: No edema.     Left lower leg: Edema (mild, nonpitting) present.  Skin:    General: Skin is warm and dry.     Findings: No rash.  Neurological:     Mental Status: She is alert.     Sensory: Sensory deficit (Decreased sensation to light touch left plantar surface,toes, medial ankle) present.            Assessment & Plan:    Leg swelling: Chronic Acute worsening of left ankle and foot swelling for no obvious reason The swelling is very mild No change in sodium intake, no injury, has not been more sedentary I do not think that she has a DVT I think the swelling is secondary to her chronic knee issue, chronic venous insufficiency and probably neuropathy Continue to elevate legs Monitoring for symptoms should return for follow-up  Numbness left foot: Chronic Acute worsening sometime in the last 3 days Denies any worsening of her left knee pain: Chronic back pain Denies any new pain in her left leg or numbness of the left leg States particular the bottom of her left foot is more numb than usual and she does have decreased sensation to light touch Likely secondary to her chronic back issues for worsening of peripheral neuropathy Advise follow-up with Dr. Denyse Amass next week since he knows her history and has been training her for her back and knee issues She is not having any other acute symptoms indicate worsening of her back or any-no change in pain medication Wants to avoid steroids given that she is trying to lose weight She takes ibuprofen as  needed-advised taking ibuprofen 3 times daily with food until she sees Dr. Leander Rams if there is something acutely inflammatory going on this will help If symptoms worsen will need to follow-up to evaluate further, but since it is a Friday afternoon we are very limited with what we can do at this time  She agrees with plan-will schedule an appointment downstairs for next week

## 2023-01-07 NOTE — Patient Instructions (Addendum)
     Follow up with Dr Denyse Amass.     Medications changes include :   start taking ibuprofen three times a day with food until you see Dr Denyse Amass.

## 2023-01-12 ENCOUNTER — Encounter (INDEPENDENT_AMBULATORY_CARE_PROVIDER_SITE_OTHER): Payer: Self-pay | Admitting: Family Medicine

## 2023-01-12 ENCOUNTER — Ambulatory Visit (INDEPENDENT_AMBULATORY_CARE_PROVIDER_SITE_OTHER): Payer: Medicaid Other | Admitting: Family Medicine

## 2023-01-12 VITALS — BP 133/85 | HR 86 | Temp 97.9°F | Ht 63.0 in | Wt 267.0 lb

## 2023-01-12 DIAGNOSIS — Z658 Other specified problems related to psychosocial circumstances: Secondary | ICD-10-CM

## 2023-01-12 DIAGNOSIS — E559 Vitamin D deficiency, unspecified: Secondary | ICD-10-CM

## 2023-01-12 DIAGNOSIS — M170 Bilateral primary osteoarthritis of knee: Secondary | ICD-10-CM

## 2023-01-12 DIAGNOSIS — R7303 Prediabetes: Secondary | ICD-10-CM

## 2023-01-12 DIAGNOSIS — Z6841 Body Mass Index (BMI) 40.0 and over, adult: Secondary | ICD-10-CM

## 2023-01-12 NOTE — Assessment & Plan Note (Signed)
Barriers to progress over time have included financial strain, lack of a good support system and ongoing psychosocial stressors with chronic pain syndrome.  She voices increased stress since being out of work.  She is awaiting disability.  Continue to keep junk food snacks out of the house.  Focus on eating on a schedule and trying to stick to prescribed meal plan.  Consider adding in cognitive behavioral therapy with Dr. Dewaine Conger.

## 2023-01-12 NOTE — Assessment & Plan Note (Signed)
Lab Results  Component Value Date   HGBA1C 6.0 (H) 11/24/2022   History of metformin intolerance.  Unfortunately, her insurance would not cover Ozempic with her prediabetes and insulin resistance diagnosis.  Her last fasting insulin was elevated at 18.2.  She has been mindful of added sugar when reading labels.  Her physical activity remains limited due to chronic pain.  Continue to work on limiting intake of added sugar and refined carbohydrates while focusing on lean protein and fiber with meals.  Begin chair exercises as tolerated 15 minutes 3 times a week.

## 2023-01-12 NOTE — Assessment & Plan Note (Signed)
Last vitamin D Lab Results  Component Value Date   VD25OH 13.4 (L) 11/24/2022   Taking calcitriol 0.25 mcg once daily per PCP.  Has known allergy to vitamin D analogs.  We have discussed the target vitamin D level 50-70.  Plan to recheck level in the next 3 months.

## 2023-01-12 NOTE — Progress Notes (Signed)
Office: [phone]  /  Fax: [phone]  WEIGHT SUMMARY AND BIOMETRICS  Starting Date: 11/24/22  Starting Weight: 266lb   Weight Lost Since Last Visit: 2lb   Vitals Temp: 97.9 F (36.6 C) BP: 133/85 Pulse Rate: 86 SpO2: 96 %   Body Composition  Body Fat %: 52.4 % Fat Mass (lbs): 140 lbs Muscle Mass (lbs): 120.6 lbs Total Body Water (lbs): 86 lbs Visceral Fat Rating : 19    HPI  Chief Complaint: OBESITY  Tiffany Medina is here to discuss her progress with her obesity treatment plan. She is on the the Category 3 Plan and states she is following her eating plan approximately 25 % of the time. She states she is exercising 0 minutes 0 times per week.   Interval History:  Since last office visit she is down 2 lb She is up 0.6 lb of muscle mass and down 3.2 lb of body fat in the past 2 weeks She has been dealing with L foot numbness which was limited exercise [facility] Dr Tiffany Medina back to discuss her joint pain and foot numbness She was prescribed Ozempic for PDM but this was denied by insurance She is not eating according to her meal plan; limited by finances, stress She is still in the habit of skipping meals and eating only one meal per day Stress levels remain high being out of work and awaiting disability claim She has been gardening and growing her own vegetables  Pharmacotherapy: none  PHYSICAL EXAM:  Blood pressure 133/85, pulse 86, temperature 97.9 F (36.6 C), height 5\' 3"  (1.6 m), weight 267 lb (121.1 kg), SpO2 96 %. Body mass index is 47.3 kg/m.  General: She is overweight, cooperative, alert, well developed, and in no acute distress. PSYCH: Has normal mood, affect and thought process.   Lungs: Normal breathing effort, no conversational dyspnea.   ASSESSMENT AND PLAN  TREATMENT PLAN FOR OBESITY:  Recommended Dietary Goals  Tiffany Medina is currently in the action stage of change. As such, her  goal is to continue weight management plan. She has agreed to the Category 3 Plan.  Behavioral Intervention  We discussed the following Behavioral Modification Strategies today: increasing lean protein intake, decreasing simple carbohydrates , increasing vegetables, increasing lower glycemic fruits, increasing water intake, work on meal planning and preparation, keeping healthy foods at home, work on managing stress, creating time for self-care and relaxation measures, avoiding temptations and identifying enticing environmental cues, continue to work on implementation of reduced calorie nutritional plan, continue to practice mindfulness when eating, and planning for success.  Additional resources provided today: NA  Recommended Physical Activity Goals  Tiffany Medina has been advised to work up to 150 minutes of moderate intensity aerobic activity a week and strengthening exercises 2-3 times per week for cardiovascular health, weight loss maintenance and preservation of muscle mass.   She has agreed to Think about ways to increase daily physical activity and overcoming barriers to exercise  Pharmacotherapy changes for the treatment of obesity: none  ASSOCIATED CONDITIONS ADDRESSED TODAY  Pre-diabetes Assessment & Plan: Lab Results  Component Value Date   HGBA1C 6.0 (H) 11/24/2022   History of metformin intolerance.  Unfortunately, her insurance would not cover Ozempic with her prediabetes and insulin resistance diagnosis.  Her last fasting insulin was elevated at 18.2.  She has been mindful of added sugar when reading labels.  Her physical activity remains limited due to chronic pain.  Continue to work on limiting intake of added sugar and refined carbohydrates while focusing on lean protein and fiber with meals.  Begin chair exercises as tolerated 15 minutes 3 times a week.   Class 3 severe obesity due to excess calories with serious comorbidity and body mass index (BMI) of 45.0 to 49.9 in  adult Tiffany Medina)  Osteoarthritis of both knees, unspecified osteoarthritis type Assessment & Plan: Bilateral knee DJD pain bilateral knee DJD pain continues to limit her ability to walk and do vigorous exercise.  She has follow-up scheduled with sports medicine.  She will need additional BMI reduction to have TKR's done in the future.  Her chronic knee pain has limited her ability to lose weight by limiting exercise.  Continue plan of care per sports medicine.  We have discussed chair exercises, water exercise as options.  Continue active plan for BMI reduction for future knee replacement.   Vitamin D deficiency Assessment & Plan: Last vitamin D Lab Results  Component Value Date   VD25OH 13.4 (L) 11/24/2022   Taking calcitriol 0.25 mcg once daily per PCP.  Has known allergy to vitamin D analogs.  We have discussed the target vitamin D level 50-70.  Plan to recheck level in the next 3 months.   Psychosocial stressors Assessment & Plan: Barriers to progress over time have included financial strain, lack of a good support system and ongoing psychosocial stressors with chronic pain syndrome.  She voices increased stress since being out of work.  She is awaiting disability.  Continue to keep junk food snacks out of the house.  Focus on eating on a schedule and trying to stick to prescribed meal plan.  Consider adding in cognitive behavioral therapy with Dr. Dewaine Medina.       She was informed of the importance of frequent follow up visits to maximize her success with intensive lifestyle modifications for her multiple health conditions.   ATTESTASTION STATEMENTS:  Reviewed by clinician on day of visit: allergies, medications, problem list, medical history, surgical history, family history, social history, and previous encounter notes pertinent to obesity diagnosis.   I have personally spent 30 minutes total time today in preparation, patient care, nutritional counseling and documentation for  this visit, including the following: review of clinical lab tests; review of medical tests/procedures/services.      Tiffany Brink, DO DABFM, DABOM [facility] [address] [place], [place] [address] [phone]

## 2023-01-12 NOTE — Assessment & Plan Note (Signed)
Bilateral knee DJD pain bilateral knee DJD pain continues to limit her ability to walk and do vigorous exercise.  She has follow-up scheduled with sports medicine.  She will need additional BMI reduction to have TKR's done in the future.  Her chronic knee pain has limited her ability to lose weight by limiting exercise.  Continue plan of care per sports medicine.  We have discussed chair exercises, water exercise as options.  Continue active plan for BMI reduction for future knee replacement.

## 2023-01-13 ENCOUNTER — Encounter: Payer: Self-pay | Admitting: Family Medicine

## 2023-01-13 ENCOUNTER — Ambulatory Visit (INDEPENDENT_AMBULATORY_CARE_PROVIDER_SITE_OTHER): Payer: Medicaid Other | Admitting: Family Medicine

## 2023-01-13 VITALS — BP 142/96 | HR 81 | Ht 63.0 in | Wt 262.2 lb

## 2023-01-13 DIAGNOSIS — M5416 Radiculopathy, lumbar region: Secondary | ICD-10-CM

## 2023-01-13 DIAGNOSIS — R29898 Other symptoms and signs involving the musculoskeletal system: Secondary | ICD-10-CM | POA: Diagnosis not present

## 2023-01-13 NOTE — Patient Instructions (Addendum)
Thank you for coming in today.   Please call [facility] at [phone] to schedule your spine injection.    I agree with water aerobics.   Let me know how it goes. I agree with [facility].   If not better after the injection, let me know and I will order a MRI

## 2023-01-13 NOTE — Progress Notes (Signed)
I, Stevenson Clinch, CMA acting as a scribe for [name], MD.  Tiffany Medina is a 57 y.o. female who presents to [facility] today for L foot numbness. Pt was previously seen by Dr. Denyse Amass on 11/12/22 for R knee pain.  Today, pt c/o numbness in her L foot ongoing for years, worsening over the past 9 days, sudden onset edema and numbness in the bottom of the left foot. Had eval with Dr. Lawerance Bach this past Friday, was advised to take IBU for the pain. Numbness if constant, pain waxes and wanes. Swelling has improved since onset. The foot is painful to walk on. Chronic lower back pain, denies worsening sx. Notes some wheezing but denies SOB or CP. Current smoker.   Swelling: yes Aggravates: ambulation Treatments tried: IBU  Dx testing: 05/03/22 L foot XR  12/02/16 L LE vasc US  11/28/14 L LE vasc US  Pertinent review of systems: No fevers or chills  Relevant historical information: Prior lumbar radiculopathy and pars defect lumbar spine.   Exam:  BP (!) 142/96   Pulse 81   Ht 5\' 3"  (1.6 m)   Wt 262 lb 3.2 oz (118.9 kg)   SpO2 95%   BMI 46.45 kg/m  General: Well Developed, well nourished, and in no acute distress.   MSK: L-spine: Normal appearing. Nontender palpation spinal midline. Normal lumbar motion. Lower extremity strength is reduced left foot dorsiflexion rated 4/5.  Otherwise lower extremity strength and reflexes are intact. Positive slump test left leg.    Lab and Radiology Results  EXAM: LUMBAR SPINE - 2-3 VIEW   COMPARISON:  Lumbar spine radiographs 11/27/2021; contemporaneous thoracic spine radiographs 09/22/2022   FINDINGS: There appears to be variant thoracic and lumbar vertebral body numbering. Counting down from T1 on thoracic spine radiographs 09/22/2022, there appear to be 12 rib-bearing thoracic type vertebral bodies. The next vertebral body is non-rib-bearing and considered "T13."   The next 5 vertebral bodies are  considered L1 through L5, as on the 11/27/2021 prior lumbar spine radiograph report. The L4 and L5 vertebral bodies are predominantly inferior to the iliac crest on lateral view.   There is mild levocurvature centered at L1-2, unchanged.   There is 4 mm grade 1 anterolisthesis of L5 on S1, 2 mm grade 1 anterolisthesis of L4 on L5, and 4 mm retrolisthesis of L2 on L3 and L1 on L2, unchanged from 11/27/2021.   The lumbar vertebral body heights are maintained. Mild anterior T11-12 height loss is unchanged from 11/27/2021. Severe anterior T10-11 disc space narrowing vacuum phenomenon, high-grade endplate sclerosis, and large anterior endplate osteophytosis. Minimal anterior T11-12 disc space narrowing with moderate anterior T11-12 and T12-L1 endplate osteophytes.   L5-S1 moderate to severe and L4-5 greater than L3-4 moderate facet joint hypertrophy and sclerosis.   The bilateral sacroiliac joint spaces are maintained. There are surgical clips overlying the right lower abdominal quadrant and superior right pelvis.   IMPRESSION: 1. Variant thoracic and lumbar vertebral body numbering. Category down from T1 on thoracic spine radiographs 09/22/2022, there appear to be 12 rib-bearing thoracic type vertebral bodies. The next vertebral body is considered T13, as on the 11/27/2021 prior lumbar spine radiograph report. 2. Mild retrolisthesis of L1 on L2 and L2 on L3 and mild grade 1 anterolisthesis of L4 on L5 and L5 on S1, unchanged. 3. Mild anterior height loss of the T11 vertebral body is unchanged and chronic. Severe anterior T10-11 degenerative disc and endplate changes. 4. No  acute fracture or traumatic listhesis.     Electronically Signed   By: Neita Garnet M.D.   On: 09/23/2022 11:04 I, [name], personally (independently) visualized and performed the interpretation of the images attached in this note.   EXAM: MRI LUMBAR SPINE WITHOUT CONTRAST   TECHNIQUE: Multiplanar,  multisequence MR imaging of the lumbar spine was performed. No intravenous contrast was administered.   COMPARISON:  None.   FINDINGS: Segmentation: 5 non rib-bearing lumbar type vertebral bodies are present. The lowest fully formed vertebral body is L5.   Alignment: Slight retrolisthesis is present at T12-L1, L1-2, and L2-3. Slight degenerative anterolisthesis is present at L4-5 and L5-S1. Left-sided pars defect noted at L5. Levoconvex curvature is centered at L2-3.   Vertebrae: Edematous endplate marrow changes are noted anteriorly at T10-11. Chronic fatty endplate marrow changes are worse left than right anteriorly at T11-12. Mild chronic fatty endplate changes are present anteriorly at L1-2. Marrow signal and vertebral body heights are otherwise normal.   Conus medullaris and cauda equina: Conus extends to the T12-L1 level. Conus and cauda equina appear normal.   Paraspinal and other soft tissues: Limited imaging the abdomen is unremarkable. There is no significant adenopathy. No solid organ lesions are present.   Disc levels:   T12-L1: Mild disc bulging is present without significant stenosis.   L1-2: Mild disc bulging and facet hypertrophy is present without significant stenosis.   L2-3: A far right lateral disc protrusion is present. Mild facet hypertrophy is noted bilaterally. Moderate right and mild left foraminal narrowing is present.   L3-4: A broad-based disc protrusion is present. Moderate facet hypertrophy present on the left. Mild broad-based disc bulging is present. Central canal is patent. Mild foraminal narrowing is slightly worse on the left.   L4-5: A broad-based disc protrusion is present scratched at there is some uncovering of the disc. Moderate facet hypertrophy is noted. Mild subarticular narrowing is present bilaterally. Foramina are patent.   L5-S1: Left L5 pars defect is present. No significant stenosis is present.   IMPRESSION: 1.  Moderate right and mild left foraminal stenosis at L2-3 secondary to a far right lateral disc protrusion and bilateral facet hypertrophy. 2. Mild foraminal narrowing bilaterally at L3-4 is slightly worse on the left. 3. Mild subarticular narrowing bilaterally at L4-5 is secondary to a broad-based disc protrusion and moderate facet hypertrophy. 4. Left L5 pars defect without significant stenosis. This likely contributes to mild anterolisthesis.     Electronically Signed   By: Marin Roberts M.D.   On: 11/30/2020 14:06     Assessment and Plan: 57 y.o. female with left leg numbness and pain thought to be lumbar radiculopathy likely L5.  This is an acute exacerbation of a chronic problem.  Her prior MRI is May 2022.  She had an x-ray a few months ago of her lumbar spine as well.  Plan for trial of epidural steroid injection and adjust from there.  If this shot does not help we should get a new MRI.  She does have some new weakness which is concerning.   PDMP not reviewed this encounter. Orders Placed This Encounter  Procedures   DG INJECT DIAG/THERA/INC NEEDLE/CATH/PLC EPI/LUMB/SAC W/IMG    Standing Status:   Future    Standing Expiration Date:   01/13/2024    Order Specific Question:   Reason for Exam (SYMPTOM  OR DIAGNOSIS REQUIRED)    Answer:   ESI/NRB or both. Low back pain and left foot numbness. Suspect Rt  L5. Level and technique per radiology    Order Specific Question:   Is the patient pregnant?    Answer:   No    Order Specific Question:   Preferred Imaging Location?    Answer:   [id number]    Order Specific Question:   Radiology Contrast Protocol - do NOT remove file path    Answer:   [email or web address]   No orders of the defined types were placed in this encounter.    Discussed warning signs or symptoms. Please see discharge instructions. Patient expresses understanding.   The above documentation has been reviewed  and is accurate and complete [name], M.D.

## 2023-01-25 ENCOUNTER — Ambulatory Visit
Admission: RE | Admit: 2023-01-25 | Discharge: 2023-01-25 | Disposition: A | Discharge status: Home / Self Care | Payer: Medicaid Other | Source: Ambulatory Visit | Attending: Family Medicine | Admitting: Family Medicine

## 2023-01-25 ENCOUNTER — Telehealth: Payer: Self-pay | Admitting: *Deleted

## 2023-01-25 DIAGNOSIS — M5416 Radiculopathy, lumbar region: Secondary | ICD-10-CM

## 2023-01-25 MED ORDER — IOPAMIDOL (ISOVUE-M 200) INJECTION 41%
1.0000 mL | Freq: Once | INTRAMUSCULAR | Status: AC
  Administered 2023-01-25: 1 mL via EPIDURAL

## 2023-01-25 MED ORDER — METHYLPREDNISOLONE ACETATE 40 MG/ML INJ SUSP (RADIOLOG
80.0000 mg | Freq: Once | INTRAMUSCULAR | Status: AC
  Administered 2023-01-25: 80 mg via EPIDURAL

## 2023-01-25 NOTE — Telephone Encounter (Signed)
Rec'd fax pt needing PA on Hydrocodone-Acetamin 7.5/325 mg. Submitted w/ (Key: [id number]) PA sent to [facility]...Raechel Chute

## 2023-01-25 NOTE — Discharge Instructions (Signed)
Post Procedure Spinal Discharge Instruction Sheet  You may resume a regular diet and any medications that you routinely take (including pain medications) unless otherwise noted by MD.  No driving day of procedure.  Light activity throughout the rest of the day.  Do not do any strenuous work, exercise, bending or lifting.  The day following the procedure, you can resume normal physical activity but you should refrain from exercising or physical therapy for at least three days thereafter.  You may apply ice to the injection site, 20 minutes on, 20 minutes off, as needed. Do not apply ice directly to skin.    Common Side Effects:  Headaches- take your usual medications as directed by your physician.  Increase your fluid intake.  Caffeinated beverages may be helpful.  Lie flat in bed until your headache resolves.  Restlessness or inability to sleep- you may have trouble sleeping for the next few days.  Ask your referring physician if you need any medication for sleep.  Facial flushing or redness- should subside within a few days.  Increased pain- a temporary increase in pain a day or two following your procedure is not unusual.  Take your pain medication as prescribed by your referring physician.  Leg cramps  Please contact our office at [phone] for the following symptoms: Fever greater than 100 degrees. Headaches unresolved with medication after 2-3 days. Increased swelling, pain, or redness at injection site.  YOU MAY RESUME YOUR ASPIRIN TODAY POST PROCEDURE.  Thank you for visiting [facility] today.

## 2023-01-26 NOTE — Telephone Encounter (Signed)
Check status on PA it states "Member Not Found". Called pharmacy to see what insurance are they running. Last states [place] [employer] . She gave me [phone] to call.\ Called Selena Lesser spoke [place] she is faxing over [place].Marland KitchenRaechel Chute.... Resent via cover-my-meds submitted w/ (Key: [id number]). PA has gone to plan.Marland KitchenShearon Stalls

## 2023-01-27 NOTE — Telephone Encounter (Signed)
Check status on PA med was DENIED!!..Raechel Chute

## 2023-02-02 ENCOUNTER — Encounter (INDEPENDENT_AMBULATORY_CARE_PROVIDER_SITE_OTHER): Payer: Self-pay

## 2023-02-09 ENCOUNTER — Encounter (INDEPENDENT_AMBULATORY_CARE_PROVIDER_SITE_OTHER): Payer: Self-pay | Admitting: Family Medicine

## 2023-02-09 ENCOUNTER — Telehealth (INDEPENDENT_AMBULATORY_CARE_PROVIDER_SITE_OTHER): Payer: Self-pay | Admitting: Family Medicine

## 2023-02-09 ENCOUNTER — Ambulatory Visit (INDEPENDENT_AMBULATORY_CARE_PROVIDER_SITE_OTHER): Payer: Medicaid Other | Admitting: Family Medicine

## 2023-02-09 VITALS — BP 126/84 | HR 90 | Temp 97.8°F | Ht 63.0 in | Wt 270.0 lb

## 2023-02-09 DIAGNOSIS — M170 Bilateral primary osteoarthritis of knee: Secondary | ICD-10-CM

## 2023-02-09 DIAGNOSIS — Z6841 Body Mass Index (BMI) 40.0 and over, adult: Secondary | ICD-10-CM

## 2023-02-09 DIAGNOSIS — R7303 Prediabetes: Secondary | ICD-10-CM

## 2023-02-09 DIAGNOSIS — E669 Obesity, unspecified: Secondary | ICD-10-CM | POA: Diagnosis not present

## 2023-02-09 MED ORDER — WEGOVY 0.5 MG/0.5ML ~~LOC~~ SOAJ
0.5000 mg | SUBCUTANEOUS | 0 refills | Status: DC
Start: 2023-02-09 — End: 2023-02-15

## 2023-02-09 NOTE — Telephone Encounter (Signed)
Tiffany Medina (Key: [id number])  [facility] has not yet replied to your PA request. You may close this dialog box, return to your dashboard, and perform other tasks. To check for an update later, open this request again from your dashboard.  If [facility] has not replied to your urgent request within 24 hours, please contact [facility] at the provider services number on the [employer] card.

## 2023-02-09 NOTE — Progress Notes (Unsigned)
Chief Complaint:   OBESITY Tiffany Medina is here to discuss her progress with her obesity treatment plan along with follow-up of her obesity related diagnoses. Tiffany Medina is on the Category 3 Plan and states she is following her eating plan approximately 20% of the time. Tiffany Medina states she is walking for 15 minutes 1-2 times per week.  Today's visit was #: 4 Starting weight: 266 lbs Starting date: 11/24/2022 Today's weight: 270 lbs Today's date: 02/09/2023 Total lbs lost to date: 0 Total lbs lost since last in-office visit: 0  Interim History: Patient has not been able to concentrate on her weight loss recently.  She needs a knee replacement and she has to lose down below a BMI of 40.  Subjective:   1. Primary osteoarthritis of both knees Patient now has a disability.  She needs to lose weight to have knee replacement surgery.  2. Prediabetes Patient is working on her diet, but she could benefit from a GLP-1.  Assessment/Plan:   1. Primary osteoarthritis of both knees Patient is to work on her diet to help with weight loss, and consider physical therapy for knee pain to help with exercise.  2. Prediabetes Patient is to start [facility] for both obesity and pre-diabetes.  3. BMI 45.0-49.9, adult (HCC) - Semaglutide-Weight Management (WEGOVY) 0.5 MG/0.5ML SOAJ; Inject 0.5 mg into the skin once a week.  Dispense: 2 mL; Refill: 0  4. Obesity, Beginning BMI 47.3 Patient agreed to start Wegovy 0.5 mg once weekly with no refills.  - Semaglutide-Weight Management (WEGOVY) 0.5 MG/0.5ML SOAJ; Inject 0.5 mg into the skin once a week.  Dispense: 2 mL; Refill: 0  Tiffany Medina is currently in the action stage of change. As such, her goal is to continue with weight loss efforts. She has agreed to the Category 3 Plan.   Exercise goals: As is.  Behavioral modification strategies: increasing lean protein intake and meal planning and cooking strategies.  Tiffany Medina has agreed to follow-up with our  clinic in 4 weeks. She was informed of the importance of frequent follow-up visits to maximize her success with intensive lifestyle modifications for her multiple health conditions.   Objective:   Blood pressure 126/84, pulse 90, temperature 97.8 F (36.6 C), height 5\' 3"  (1.6 m), weight 270 lb (122.5 kg), SpO2 90%. Body mass index is 47.83 kg/m.  Lab Results  Component Value Date   CREATININE 0.94 11/24/2022   BUN 25 (H) 11/24/2022   NA 141 11/24/2022   K 4.9 11/24/2022   CL 100 11/24/2022   CO2 23 11/24/2022   Lab Results  Component Value Date   ALT 20 11/24/2022   AST 18 11/24/2022   ALKPHOS 100 11/24/2022   BILITOT <0.2 11/24/2022   Lab Results  Component Value Date   HGBA1C 6.0 (H) 11/24/2022   HGBA1C 6.1 07/30/2022   HGBA1C 6.0 02/01/2022   HGBA1C 6.0 04/20/2021   HGBA1C 5.9 02/15/2020   Lab Results  Component Value Date   INSULIN 18.2 11/24/2022   INSULIN 12.3 02/02/2018   INSULIN 11.5 08/08/2017   INSULIN 23.7 04/11/2017   Lab Results  Component Value Date   TSH 1.47 09/29/2022   Lab Results  Component Value Date   CHOL 186 11/24/2022   HDL 68 11/24/2022   LDLCALC 104 (H) 11/24/2022   TRIG 74 11/24/2022   CHOLHDL 2.7 11/24/2022   Lab Results  Component Value Date   VD25OH 13.4 (L) 11/24/2022   VD25OH 14.88 (L) 07/30/2022   VD25OH 12.72 (  L) 02/01/2022   Lab Results  Component Value Date   WBC 6.8 11/24/2022   HGB 14.9 11/24/2022   HCT 45.1 11/24/2022   MCV 95 11/24/2022   PLT 332 11/24/2022   No results found for: "IRON", "TIBC", "FERRITIN"  Attestation Statements:   Reviewed by clinician on day of visit: allergies, medications, problem list, medical history, surgical history, family history, social history, and previous encounter notes.   I, Burt Knack, am acting as transcriptionist for Quillian Quince, MD.  I have reviewed the above documentation for accuracy and completeness, and I agree with the above. -  Quillian Quince, MD

## 2023-02-09 NOTE — Telephone Encounter (Signed)
PA SUBMITTED VIA COVERMYMEDS

## 2023-02-10 NOTE — Telephone Encounter (Signed)
Per Cover My Meds: Medication denied.

## 2023-02-15 ENCOUNTER — Ambulatory Visit: Payer: Self-pay | Admitting: Internal Medicine

## 2023-02-15 ENCOUNTER — Encounter: Payer: Self-pay | Admitting: Internal Medicine

## 2023-02-15 ENCOUNTER — Telehealth: Payer: Self-pay | Admitting: Family Medicine

## 2023-02-15 ENCOUNTER — Telehealth: Payer: Self-pay

## 2023-02-15 VITALS — BP 130/84 | HR 95 | Temp 98.6°F | Ht 63.0 in | Wt 273.0 lb

## 2023-02-15 DIAGNOSIS — E538 Deficiency of other specified B group vitamins: Secondary | ICD-10-CM

## 2023-02-15 DIAGNOSIS — R29898 Other symptoms and signs involving the musculoskeletal system: Secondary | ICD-10-CM

## 2023-02-15 DIAGNOSIS — L930 Discoid lupus erythematosus: Secondary | ICD-10-CM

## 2023-02-15 DIAGNOSIS — F988 Other specified behavioral and emotional disorders with onset usually occurring in childhood and adolescence: Secondary | ICD-10-CM

## 2023-02-15 DIAGNOSIS — F419 Anxiety disorder, unspecified: Secondary | ICD-10-CM

## 2023-02-15 DIAGNOSIS — M5416 Radiculopathy, lumbar region: Secondary | ICD-10-CM

## 2023-02-15 DIAGNOSIS — Z6838 Body mass index (BMI) 38.0-38.9, adult: Secondary | ICD-10-CM

## 2023-02-15 DIAGNOSIS — G9332 Myalgic encephalomyelitis/chronic fatigue syndrome: Secondary | ICD-10-CM

## 2023-02-15 DIAGNOSIS — G8929 Other chronic pain: Secondary | ICD-10-CM

## 2023-02-15 DIAGNOSIS — M5440 Lumbago with sciatica, unspecified side: Secondary | ICD-10-CM

## 2023-02-15 MED ORDER — WEGOVY 0.25 MG/0.5ML ~~LOC~~ SOAJ: 0.2500 mg | SUBCUTANEOUS | 2 refills | Status: DC

## 2023-02-15 MED ORDER — ALPRAZOLAM 0.5 MG PO TABS
ORAL_TABLET | ORAL | 3 refills | Status: DC
Start: 2023-02-15 — End: 2023-04-19

## 2023-02-15 MED ORDER — HYDROCODONE-ACETAMINOPHEN 7.5-325 MG PO TABS: 1.0000 | ORAL_TABLET | Freq: Three times a day (TID) | ORAL | 0 refills | Status: DC | PRN

## 2023-02-15 MED ORDER — AMPHETAMINE-DEXTROAMPHETAMINE 5 MG PO TABS: ORAL_TABLET | ORAL | 0 refills | Status: DC

## 2023-02-15 NOTE — Assessment & Plan Note (Signed)
UDS - THC ?due to CBD cream use: quit. Repeat test was (-). Re-start Norco; monitor UDS Positive THC on the drug screen test came from CBD cream per patient On Norco to 7.5/325 mg  Potential benefits of a long term opioids use as well as potential risks (i.e. addiction risk, apnea etc) and complications (i.e. Somnolence, constipation and others) were explained to the patient and were aknowledged.

## 2023-02-15 NOTE — Assessment & Plan Note (Signed)
Stress reduction

## 2023-02-15 NOTE — Telephone Encounter (Signed)
PA for [facility] sent in.  (Key: [id number])

## 2023-02-15 NOTE — Assessment & Plan Note (Signed)
Provigil, nuvigil - not covered; adderall is covered Will Rx Adderall - monitor HR  Potential benefits of a long term amphetamines  use as well as potential risks  and complications were explained to the patient and were aknowledged.  

## 2023-02-15 NOTE — Assessment & Plan Note (Signed)
On B12 

## 2023-02-15 NOTE — Progress Notes (Addendum)
Subjective:  Patient ID: Tiffany Medina, female    DOB: Sep 02, 1965  Age: 57 y.o. MRN: [id number]  CC: Follow-up (6 week f/u)   HPI Anujin Coston presents for obesity, anxiety, ADD, LBP f/u  Outpatient Medications Prior to Visit  Medication Sig Dispense Refill   albuterol (PROVENTIL HFA) 108 (90 Base) MCG/ACT inhaler Inhale 2 puffs into the lungs every 4 (four) hours as needed for wheezing or shortness of breath. 8.5 g 11   ASPIRIN 81 PO Take 2 tablets by mouth daily.     buPROPion (WELLBUTRIN SR) 200 MG 12 hr tablet Take 1 tablet (200 mg total) by mouth daily. 90 tablet 3   calcitRIOL (ROCALTROL) 0.5 MCG capsule Take 1 capsule (0.5 mcg total) by mouth in the morning and at bedtime. 60 capsule 11   cyanocobalamin (VITAMIN B12) 1000 MCG/ML injection Inject 1 mL (1,000 mcg total) into the muscle every 14 (fourteen) days. 6 mL 3   cyclobenzaprine (FLEXERIL) 10 MG tablet TAKE 1 TABLET 3 TIMES A DAY AS NEEDED 90 tablet 1   diphenoxylate-atropine (LOMOTIL) 2.5-0.025 MG tablet Take 1 tablet by mouth 4 (four) times daily as needed for diarrhea or loose stools. 60 tablet 1   ibuprofen (ADVIL) 800 MG tablet Take 1 tablet (800 mg total) by mouth every 8 (eight) hours as needed. 90 tablet 2   levothyroxine (SYNTHROID) 100 MCG tablet TAKE 1 TABLET BY MOUTH ONCE DAILY BEFORE BREAKFAST (Patient taking differently: TAKE 1/2 TABLET BY MOUTH ONCE DAILY BEFORE BREAKFAST ( )) 90 tablet 3   ondansetron (ZOFRAN) 4 MG tablet Take 1 tablet (4 mg total) by mouth every 8 (eight) hours as needed for nausea or vomiting. 20 tablet 1   pantoprazole (PROTONIX) 40 MG tablet TAKE 1 TABLET TWICE DAILY 180 tablet 1   ALPRAZolam (XANAX) 0.5 MG tablet TAKE 3 TABLETS BY MOUTH EVERY DAY AT BEDTIME AS NEEDED 90 tablet 3   amphetamine-dextroamphetamine (ADDERALL) 5 MG tablet 5 mg in am, 5 mg at lunch 60 tablet 0   HYDROcodone-acetaminophen (NORCO) 7.5-325 MG tablet Take 1 tablet by mouth every 8 (eight) hours as needed for  severe pain. 90 tablet 0   Semaglutide-Weight Management (WEGOVY) 0.5 MG/0.5ML SOAJ Inject 0.5 mg into the skin once a week. 2 mL 0   No facility-administered medications prior to visit.    ROS: Review of Systems  Constitutional:  Positive for fatigue. Negative for activity change, appetite change, chills and unexpected weight change.  HENT:  Negative for congestion, mouth sores and sinus pressure.   Eyes:  Negative for visual disturbance.  Respiratory:  Negative for cough and chest tightness.   Gastrointestinal:  Negative for abdominal pain and nausea.  Genitourinary:  Negative for difficulty urinating, frequency and vaginal pain.  Musculoskeletal:  Positive for back pain and gait problem.  Skin:  Negative for pallor and rash.  Neurological:  Negative for dizziness, tremors, weakness, numbness and headaches.  Psychiatric/Behavioral:  Positive for decreased concentration. Negative for confusion, sleep disturbance and suicidal ideas. The patient is nervous/anxious.     Objective:  BP 130/84 (BP Location: Left Arm, Patient Position: Sitting, Cuff Size: Large)   Pulse 95   Temp 98.6 F (37 C) (Oral)   Ht 5\' 3"  (1.6 m)   Wt 273 lb (123.8 kg)   SpO2 95%   BMI 48.36 kg/m   BP Readings from Last 3 Encounters:  02/15/23 130/84  02/09/23 126/84  01/25/23 (!) 174/84    Wt Readings from Last 3 Encounters:  02/15/23 273 lb (123.8 kg)  02/09/23 270 lb (122.5 kg)  01/13/23 262 lb 3.2 oz (118.9 kg)    Physical Exam Constitutional:      General: She is not in acute distress.    Appearance: She is well-developed. She is obese.  HENT:     Head: Normocephalic.     Right Ear: External ear normal.     Left Ear: External ear normal.     Nose: Nose normal.  Eyes:     General:        Right eye: No discharge.        Left eye: No discharge.     Conjunctiva/sclera: Conjunctivae normal.     Pupils: Pupils are equal, round, and reactive to light.  Neck:     Thyroid: No thyromegaly.      Vascular: No JVD.     Trachea: No tracheal deviation.  Cardiovascular:     Rate and Rhythm: Normal rate and regular rhythm.     Heart sounds: Normal heart sounds.  Pulmonary:     Effort: No respiratory distress.     Breath sounds: No stridor. No wheezing.  Abdominal:     General: Bowel sounds are normal. There is no distension.     Palpations: Abdomen is soft. There is no mass.     Tenderness: There is no abdominal tenderness. There is no guarding or rebound.  Musculoskeletal:        General: Tenderness present.     Cervical back: Normal range of motion and neck supple. No rigidity.  Lymphadenopathy:     Cervical: No cervical adenopathy.  Skin:    Findings: No erythema or rash.  Neurological:     Mental Status: She is oriented to person, place, and time. Mental status is at baseline.     Cranial Nerves: No cranial nerve deficit.     Motor: No abnormal muscle tone.     Coordination: Coordination normal.     Gait: Gait abnormal.     Deep Tendon Reflexes: Reflexes normal.  Psychiatric:        Behavior: Behavior normal.        Thought Content: Thought content normal.        Judgment: Judgment normal.    Antalgic gait    A total time of 45 minutes was spent preparing to see the patient, reviewing tests, x-rays, operative reports and other medical records.  Also, obtaining history and performing comprehensive physical exam.  Additionally, counseling the patient regarding the above listed issues.   Finally, documenting clinical information in the health records, coordination of care, educating the patient -she is planning to fly to [place] to see her friend.  DVT prophylaxis discussed.. It is a complex case.   Lab Results  Component Value Date   WBC 6.8 11/24/2022   HGB 14.9 11/24/2022   HCT 45.1 11/24/2022   PLT 332 11/24/2022   GLUCOSE 75 11/24/2022   CHOL 186 11/24/2022   TRIG 74 11/24/2022   HDL 68 11/24/2022   LDLCALC 104 (H) 11/24/2022   ALT 20 11/24/2022   AST 18  11/24/2022   NA 141 11/24/2022   K 4.9 11/24/2022   CL 100 11/24/2022   CREATININE 0.94 11/24/2022   BUN 25 (H) 11/24/2022   CO2 23 11/24/2022   TSH 1.47 09/29/2022   INR 1.02 11/28/2014   HGBA1C 6.0 (H) 11/24/2022    DG INJECT DIAG/THERA/INC NEEDLE/CATH/PLC EPI/LUMB/SAC W/IMG  Result Date: 01/25/2023 CLINICAL DATA:  Low back pain  radiating to the left leg all the way to the foot. FLUOROSCOPY: Radiation Exposure Index (as provided by the fluoroscopic device): 0 minutes 42 seconds. 70.58 micro gray meter squared PROCEDURE: The procedure, risks, benefits, and alternatives were explained to the patient. Questions regarding the procedure were encouraged and answered. The patient understands and consents to the procedure. LUMBAR EPIDURAL INJECTION: An interlaminar approach was performed on the left at L5-S1. The overlying skin was cleansed and anesthetized. A 20 gauge epidural needle was advanced using loss-of-resistance technique. DIAGNOSTIC EPIDURAL INJECTION: Injection of Isovue-M 200 shows a good epidural pattern with spread above and below the level of needle placement, primarily on the left no vascular opacification is seen. THERAPEUTIC EPIDURAL INJECTION: Eighty mg of Depo-Medrol mixed with 2 cc 1% lidocaine were instilled. The procedure was well-tolerated, and the patient was discharged thirty minutes following the injection in good condition. COMPLICATIONS: None IMPRESSION: Technically successful L5-S1 left epidural steroid injection. Electronically Signed   By: Paulina Fusi M.D.   On: 01/25/2023 15:02    Assessment & Plan:   Problem List Items Addressed This Visit     CFS (chronic fatigue syndrome) - Primary    Stress reduction       RESOLVED: Class 2 severe obesity with serious comorbidity and body mass index (BMI) of 38.0 to 38.9 in adult (HCC)    Wt Readings from Last 3 Encounters:  02/15/23 273 lb (123.8 kg)  02/09/23 270 lb (122.5 kg)  01/13/23 262 lb 3.2 oz (118.9 kg)  Diet  discussed Re-start NWGNFA       Relevant Medications   Semaglutide-Weight Management (WEGOVY) 0.25 MG/0.5ML SOAJ   amphetamine-dextroamphetamine (ADDERALL) 5 MG tablet   Discoid lupus    Pool exercises      ADD (attention deficit disorder)    Provigil, nuvigil - not covered; adderall is covered Will Rx Adderall - monitor HR  Potential benefits of a long term amphetamines  use as well as potential risks  and complications were explained to the patient and were aknowledged.      Low back pain    UDS - THC ?due to CBD cream use: quit. Repeat test was (-). Re-start Norco; monitor UDS Positive THC on the drug screen test came from CBD cream per patient On Norco to 7.5/325 mg  Potential benefits of a long term opioids use as well as potential risks (i.e. addiction risk, apnea etc) and complications (i.e. Somnolence, constipation and others) were explained to the patient and were aknowledged.      Relevant Medications   HYDROcodone-acetaminophen (NORCO) 7.5-325 MG tablet   Anxiety disorder   Relevant Medications   ALPRAZolam (XANAX) 0.5 MG tablet   B12 deficiency    On B12         Meds ordered this encounter  Medications   Semaglutide-Weight Management (WEGOVY) 0.25 MG/0.5ML SOAJ    Sig: Inject 0.25 mg into the skin once a week.    Dispense:  2 mL    Refill:  2    BMI 48 HTN, LBP   amphetamine-dextroamphetamine (ADDERALL) 5 MG tablet    Sig: 5 mg in am, 5 mg at lunch    Dispense:  60 tablet    Refill:  0    Please fill on or after 02/18/23   HYDROcodone-acetaminophen (NORCO) 7.5-325 MG tablet    Sig: Take 1 tablet by mouth every 8 (eight) hours as needed for severe pain.    Dispense:  90 tablet    Refill:  0    Please fill on or after 02/18/2023   ALPRAZolam (XANAX) 0.5 MG tablet    Sig: TAKE 3 TABLETS BY MOUTH EVERY DAY AT BEDTIME AS NEEDED    Dispense:  90 tablet    Refill:  3      Follow-up: Return in about 2 months (around 04/17/2023) for a follow-up  visit.  Sonda Primes, MD

## 2023-02-15 NOTE — Assessment & Plan Note (Signed)
Wt Readings from Last 3 Encounters:  02/15/23 273 lb (123.8 kg)  02/09/23 270 lb (122.5 kg)  01/13/23 262 lb 3.2 oz (118.9 kg)  Diet discussed Re-start NATFTD

## 2023-02-15 NOTE — Telephone Encounter (Signed)
Order placed for MRI L-Spine.    Per visit note 01/13/23:  Assessment and Plan: 57 y.o. female with left leg numbness and pain thought to be lumbar radiculopathy likely L5.  This is an acute exacerbation of a chronic problem.  Her prior MRI is May 2022.  She had an x-ray a few months ago of her lumbar spine as well.  Plan for trial of epidural steroid injection and adjust from there.  If this shot does not help we should get a new MRI.  She does have some new weakness which is concerning.

## 2023-02-15 NOTE — Telephone Encounter (Signed)
PA for HYDROcodone-acetaminophen has been sent in.  (Key: [id number])

## 2023-02-15 NOTE — Assessment & Plan Note (Signed)
Pool exercises

## 2023-02-15 NOTE — Telephone Encounter (Signed)
Pt states she is not better (maybe even worse) since the injections. Should would like to proceed with MRI, please order.

## 2023-02-19 ENCOUNTER — Other Ambulatory Visit: Payer: Self-pay | Admitting: Internal Medicine

## 2023-02-21 ENCOUNTER — Encounter: Payer: Self-pay | Admitting: Internal Medicine

## 2023-02-24 ENCOUNTER — Telehealth: Payer: Self-pay | Admitting: *Deleted

## 2023-02-24 NOTE — Telephone Encounter (Signed)
Pt states her [facility]. Pt want rx to [facility] to [facility] in [place]...Raechel Chute

## 2023-02-25 ENCOUNTER — Other Ambulatory Visit: Payer: Self-pay | Admitting: Family Medicine

## 2023-02-25 MED ORDER — HYDROCODONE-ACETAMINOPHEN 7.5-325 MG PO TABS: 1.0000 | ORAL_TABLET | Freq: Three times a day (TID) | ORAL | 0 refills | Status: DC | PRN

## 2023-02-25 NOTE — Telephone Encounter (Signed)
Forwarding to DOD.Marland KitchenRaechel Chute

## 2023-02-25 NOTE — Telephone Encounter (Addendum)
The [facility]. It just need to be resent to [facility].Marland KitchenRaechel Chute

## 2023-02-25 NOTE — Telephone Encounter (Signed)
This was not sent to [facility] as requested.  Can another doctor send this in today to [facility].  Patient is completely out of [facility] and [facility].

## 2023-02-25 NOTE — Telephone Encounter (Signed)
Okay.  Thanks.

## 2023-02-25 NOTE — Telephone Encounter (Signed)
Called pt no answer LMOM w/MD response../lmb 

## 2023-03-01 ENCOUNTER — Telehealth: Payer: Self-pay | Admitting: *Deleted

## 2023-03-01 NOTE — Telephone Encounter (Signed)
Need new order Hydrocodone-acetamin.Marland KitchenRaechel Medina

## 2023-03-01 NOTE — Telephone Encounter (Signed)
Patient wants this called in to [facility] in [place].  The remainder of last weeks script

## 2023-03-02 ENCOUNTER — Ambulatory Visit (INDEPENDENT_AMBULATORY_CARE_PROVIDER_SITE_OTHER): Payer: PRIVATE HEALTH INSURANCE | Admitting: Family Medicine

## 2023-03-03 NOTE — Telephone Encounter (Signed)
Pt stated that she has picked up and used what was Rx'd by Hetty Blend, NP on 8/23. However, pt is now needing a new refill for her full monthly amount to pharmacy below.  [id number] - [place], [place] - [address] AT [facility] OF [facility] [address] Phone: [phone]  Fax: [phone]

## 2023-03-03 NOTE — Telephone Encounter (Signed)
 Patient called back about this medication

## 2023-03-04 MED ORDER — HYDROCODONE-ACETAMINOPHEN 7.5-325 MG PO TABS: 1.0000 | ORAL_TABLET | Freq: Three times a day (TID) | ORAL | 0 refills | Status: DC | PRN

## 2023-03-04 NOTE — Telephone Encounter (Signed)
Okay.  Thanks.

## 2023-03-04 NOTE — Telephone Encounter (Signed)
There was a transmission error when sending in the prescription. Patient would like medication to be re-sent. Best callback is [phone].

## 2023-03-04 NOTE — Addendum Note (Signed)
Addended by: Tresa Garter on: 03/04/2023 09:19 AM   Modules accepted: Orders

## 2023-03-05 ENCOUNTER — Ambulatory Visit: Payer: Medicaid Other

## 2023-03-05 DIAGNOSIS — M5416 Radiculopathy, lumbar region: Secondary | ICD-10-CM | POA: Diagnosis not present

## 2023-03-05 DIAGNOSIS — R29898 Other symptoms and signs involving the musculoskeletal system: Secondary | ICD-10-CM | POA: Diagnosis not present

## 2023-03-08 DIAGNOSIS — H5213 Myopia, bilateral: Secondary | ICD-10-CM | POA: Diagnosis not present

## 2023-03-08 NOTE — Telephone Encounter (Signed)
Patient called and would like this medication sent in today if at all possible.

## 2023-03-08 NOTE — Telephone Encounter (Signed)
She should still have her usual prescription at [facility] sent in August which was not filled. Is she unable to fill this? We typically do not do duplicate prescriptions for same medication

## 2023-03-09 ENCOUNTER — Ambulatory Visit: Payer: Medicaid Other | Admitting: Internal Medicine

## 2023-03-09 ENCOUNTER — Ambulatory Visit (INDEPENDENT_AMBULATORY_CARE_PROVIDER_SITE_OTHER): Payer: PRIVATE HEALTH INSURANCE | Admitting: Physician Assistant

## 2023-03-09 ENCOUNTER — Encounter: Payer: Self-pay | Admitting: Internal Medicine

## 2023-03-09 VITALS — BP 134/78 | HR 82 | Temp 98.5°F | Ht 63.0 in | Wt 275.0 lb

## 2023-03-09 DIAGNOSIS — J209 Acute bronchitis, unspecified: Secondary | ICD-10-CM

## 2023-03-09 MED ORDER — HYDROCOD POLI-CHLORPHE POLI ER 10-8 MG/5ML PO SUER
5.0000 mL | Freq: Two times a day (BID) | ORAL | 0 refills | Status: DC | PRN
Start: 2023-03-09 — End: 2023-04-19

## 2023-03-09 MED ORDER — HYDROCODONE-ACETAMINOPHEN 7.5-325 MG PO TABS: 1.0000 | ORAL_TABLET | Freq: Three times a day (TID) | ORAL | 0 refills | Status: DC | PRN

## 2023-03-09 MED ORDER — CEFDINIR 300 MG PO CAPS: 300.0000 mg | ORAL_CAPSULE | Freq: Two times a day (BID) | ORAL | 0 refills | Status: DC

## 2023-03-09 NOTE — Telephone Encounter (Signed)
Noted. Thanks. Pt is being seen today by Dr. Lawerance Bach.

## 2023-03-09 NOTE — Progress Notes (Signed)
Subjective:    Patient ID: Tiffany Medina, female    DOB: [birth year], 57 y.o.   MRN: [id number]      HPI Elcie is here for  Chief Complaint  Patient presents with   Edema    Edema bilateral leg swelling   Cough    Cough over a week; Manson Passey and dark yellow mucus noted; Cough keeps her up at night     B/l LE edema  - sees Dr Denyse Amass tomorrow.  Has swelling in LLE - has been elevating legs more and swelling in left leg is better.  There is been collected be leg swelling in the left is related to her knee arthritis   Cold symptoms - coughing up yellow- brown sputum, wheeze, sob, headaches.  No fever, but chills.  She is smoking still.     Medications and allergies reviewed with patient and updated if appropriate.  Current Outpatient Medications on File Prior to Visit  Medication Sig Dispense Refill   albuterol (PROVENTIL HFA) 108 (90 Base) MCG/ACT inhaler Inhale 2 puffs into the lungs every 4 (four) hours as needed for wheezing or shortness of breath. 8.5 g 11   ALPRAZolam (XANAX) 0.5 MG tablet TAKE 3 TABLETS BY MOUTH EVERY DAY AT BEDTIME AS NEEDED 90 tablet 3   amphetamine-dextroamphetamine (ADDERALL) 5 MG tablet 5 mg in am, 5 mg at lunch 60 tablet 0   ASPIRIN 81 PO Take 2 tablets by mouth daily.     buPROPion (WELLBUTRIN SR) 200 MG 12 hr tablet Take 1 tablet (200 mg total) by mouth daily. 90 tablet 3   calcitRIOL (ROCALTROL) 0.5 MCG capsule Take 1 capsule (0.5 mcg total) by mouth in the morning and at bedtime. 60 capsule 11   cyanocobalamin (VITAMIN B12) 1000 MCG/ML injection Inject 1 mL (1,000 mcg total) into the muscle every 14 (fourteen) days. 6 mL 3   cyclobenzaprine (FLEXERIL) 10 MG tablet TAKE 1 TABLET BY MOUTH THREE TIMES A DAY AS NEEDED 90 tablet 1   diphenoxylate-atropine (LOMOTIL) 2.5-0.025 MG tablet Take 1 tablet by mouth 4 (four) times daily as needed for diarrhea or loose stools. 60 tablet 1   estradiol (VIVELLE-DOT) 0.1 MG/24HR patch APPLY 1 PATCH TRANSDERMALLY  TWICE A WEEK     HYDROcodone-acetaminophen (NORCO) 7.5-325 MG tablet Take 1 tablet by mouth every 8 (eight) hours as needed for severe pain. 90 tablet 0   ibuprofen (ADVIL) 800 MG tablet Take 1 tablet (800 mg total) by mouth every 8 (eight) hours as needed. 90 tablet 2   levothyroxine (SYNTHROID) 100 MCG tablet TAKE 1 TABLET BY MOUTH ONCE DAILY BEFORE BREAKFAST (Patient taking differently: TAKE 1/2 TABLET BY MOUTH ONCE DAILY BEFORE BREAKFAST ( )) 90 tablet 3   ondansetron (ZOFRAN) 4 MG tablet Take 1 tablet (4 mg total) by mouth every 8 (eight) hours as needed for nausea or vomiting. 20 tablet 1   pantoprazole (PROTONIX) 40 MG tablet TAKE 1 TABLET TWICE DAILY 180 tablet 1   Semaglutide-Weight Management (WEGOVY) 0.25 MG/0.5ML SOAJ Inject 0.25 mg into the skin once a week. 2 mL 2   No current facility-administered medications on file prior to visit.    Review of Systems  Constitutional:  Positive for chills. Negative for fever.  HENT:  Positive for postnasal drip and sore throat. Negative for congestion, ear pain and sinus pain.   Respiratory:  Positive for cough (productive of yellow-brown sputum), shortness of breath and wheezing.   Cardiovascular:  Positive for leg swelling. Negative for  chest pain and palpitations.  Neurological:  Positive for headaches. Negative for light-headedness.       Objective:   Vitals:   03/09/23 1445  BP: 134/78  Pulse: 82  Temp: 98.5 F (36.9 C)  SpO2: 92%   BP Readings from Last 3 Encounters:  03/09/23 134/78  02/15/23 130/84  02/09/23 126/84   Wt Readings from Last 3 Encounters:  03/09/23 275 lb (124.7 kg)  02/15/23 273 lb (123.8 kg)  02/09/23 270 lb (122.5 kg)   Body mass index is 48.71 kg/m.    Physical Exam Constitutional:      General: She is not in acute distress.    Appearance: Normal appearance. She is not ill-appearing.  HENT:     Head: Normocephalic and atraumatic.     Right Ear: Tympanic membrane, ear canal and external  ear normal.     Left Ear: Tympanic membrane, ear canal and external ear normal.     Mouth/Throat:     Mouth: Mucous membranes are moist.     Pharynx: No oropharyngeal exudate or posterior oropharyngeal erythema.  Eyes:     Conjunctiva/sclera: Conjunctivae normal.  Cardiovascular:     Rate and Rhythm: Normal rate and regular rhythm.  Pulmonary:     Effort: Pulmonary effort is normal. No respiratory distress.     Breath sounds: Normal breath sounds. No wheezing or rales.  Musculoskeletal:     Cervical back: Neck supple. No tenderness.     Right lower leg: Edema (trace) present.     Left lower leg: Edema (mild) present.  Lymphadenopathy:     Cervical: No cervical adenopathy.  Skin:    General: Skin is warm and dry.  Neurological:     Mental Status: She is alert.            Assessment & Plan:    Acute bronchitis Acute Smoking  Likely bacterial  Start omnicef 300  mg BID x 10 day, tussionex cough syrup otc cold medications Rest, fluid   Chronic pain management, chronic lower back pain-  Needs refill of hydrocodone -- has had difficulty getting it due to back order - [facility] did [facility] go [facility] - requesting refill today   [place] controlled substance database checked.  Refill sent to pharmacy.

## 2023-03-09 NOTE — Telephone Encounter (Signed)
[facility], and they do not know when they will have more, which is why she asked it be sent to [facility]. States when she comes in today she will ask for a paper prescription to take to the pharmacy herself.

## 2023-03-09 NOTE — Patient Instructions (Addendum)
       Medications changes include :   omnicef twice daily for 10 days, tussionex cough syrup.  [facility].     Return if symptoms worsen or fail to improve.

## 2023-03-10 ENCOUNTER — Ambulatory Visit: Payer: Medicaid Other | Admitting: Family Medicine

## 2023-03-10 ENCOUNTER — Other Ambulatory Visit: Payer: Self-pay

## 2023-03-10 ENCOUNTER — Encounter: Payer: Self-pay | Admitting: Internal Medicine

## 2023-03-10 ENCOUNTER — Encounter: Payer: Self-pay | Admitting: Family Medicine

## 2023-03-10 VITALS — BP 142/94 | HR 87 | Ht 63.0 in | Wt 276.6 lb

## 2023-03-10 DIAGNOSIS — M545 Low back pain, unspecified: Secondary | ICD-10-CM

## 2023-03-10 DIAGNOSIS — M25562 Pain in left knee: Secondary | ICD-10-CM

## 2023-03-10 DIAGNOSIS — R600 Localized edema: Secondary | ICD-10-CM

## 2023-03-10 DIAGNOSIS — M25561 Pain in right knee: Secondary | ICD-10-CM | POA: Diagnosis not present

## 2023-03-10 DIAGNOSIS — G8929 Other chronic pain: Secondary | ICD-10-CM | POA: Diagnosis not present

## 2023-03-10 LAB — COMPREHENSIVE METABOLIC PANEL
ALT: 17 U/L (ref 0–35)
AST: 21 U/L (ref 0–37)
Albumin: 4 g/dL (ref 3.5–5.2)
Alkaline Phosphatase: 85 U/L (ref 39–117)
BUN: 21 mg/dL (ref 6–23)
CO2: 28 meq/L (ref 19–32)
Calcium: 9.9 mg/dL (ref 8.4–10.5)
Chloride: 102 meq/L (ref 96–112)
Creatinine, Ser: 1.07 mg/dL (ref 0.40–1.20)
GFR: 57.32 mL/min — ABNORMAL LOW (ref >60.00)
Glucose, Bld: 89 mg/dL (ref 70–99)
Potassium: 5.3 meq/L — ABNORMAL HIGH (ref 3.5–5.1)
Sodium: 138 meq/L (ref 135–145)
Total Bilirubin: 0.2 mg/dL (ref 0.2–1.2)
Total Protein: 7.3 g/dL (ref 6.0–8.3)

## 2023-03-10 MED ORDER — FUROSEMIDE 20 MG PO TABS: 20.0000 mg | ORAL_TABLET | Freq: Every day | ORAL | 3 refills | Status: DC

## 2023-03-10 NOTE — Progress Notes (Signed)
I, Stevenson Clinch, CMA acting as a scribe for [name], MD.  Tiffany Medina is a 57 y.o. female who presents to [facility] today for f/u lumbar radiculopathy and new bilat LE swelling w/ L-spine MRI review. Pt was last seen by Dr. Denyse Amass on 01/13/23 and a lumbar ESI was ordered and later performed on July 23rd. Pt called the office on 8/13 reporting cont'd pain and a new MRI was ordered.   Today, pt reports 1 minor fall since last visit. Missed a step while ascending catching the shoe on the top stair and tumbling. This occurred about 2 weeks ago. Swelling present in the knees bilaterally. On ABX currently for bronchitis. Continues to have edema in the left leg. MRI review today. Continues to have back pain, though sx wax and wane with meds.   She has an once in a lifetime trip to [place] on September 16.  Should be gone for 10 days.  Her friend lives there and she is able to stay with her friend.  Dx testing: 03/05/23 L-spine MRI 05/03/22 L foot XR             12/02/16 L LE vasc US             11/28/14 L LE vasc US  Pertinent review of systems: No fevers or chills  Relevant historical information: Chronic venous insufficiency   Exam:  BP (!) 142/94   Pulse 87   Ht 5\' 3"  (1.6 m)   Wt 276 lb 9.6 oz (125.5 kg)   SpO2 97%   BMI 49.00 kg/m  General: Well Developed, well nourished, and in no acute distress.   MSK: Knees bilaterally mild effusion normal motion with crepitation.  Intact strength.  L-spine nontender to palpation.  Decreased lumbar motion.  Lower extremity strength is intact.  Mild nonpitting edema bilateral lower extremities left worse than right.   X-rays and imaging  Procedure: Real-time Ultrasound Guided Injection of right knee joint superior lateral patella space Device: Philips Affiniti 50G/GE Logiq Images permanently stored and available for review in PACS Verbal informed consent obtained.  Discussed risks and benefits of  procedure. Warned about infection, bleeding, hyperglycemia damage to structures among others. Patient expresses understanding and agreement Time-out conducted.   Noted no overlying erythema, induration, or other signs of local infection.   Skin prepped in a sterile fashion.   Local anesthesia: Topical Ethyl chloride.   With sterile technique and under real time ultrasound guidance: 40 mg of Kenalog and 2 mL of Marcaine injected into knee joint. Fluid seen entering the joint capsule.   Completed without difficulty   Pain immediately resolved suggesting accurate placement of the medication.   Advised to call if fevers/chills, erythema, induration, drainage, or persistent bleeding.   Images permanently stored and available for review in the ultrasound unit.  Impression: Technically successful ultrasound guided injection.  Procedure: Real-time Ultrasound Guided Injection of left knee joint superior lateral patella space Device: Philips Affiniti 50G/GE Logiq Images permanently stored and available for review in PACS Verbal informed consent obtained.  Discussed risks and benefits of procedure. Warned about infection, bleeding, hyperglycemia damage to structures among others. Patient expresses understanding and agreement Time-out conducted.   Noted no overlying erythema, induration, or other signs of local infection.   Skin prepped in a sterile fashion.   Local anesthesia: Topical Ethyl chloride.   With sterile technique and under real time ultrasound guidance: 40 mg of Kenalog and 2 mL of  Marcaine injected into knee joint. Fluid seen entering the joint capsule.   Completed without difficulty   Pain immediately resolved suggesting accurate placement of the medication.   Advised to call if fevers/chills, erythema, induration, drainage, or persistent bleeding.   Images permanently stored and available for review in the ultrasound unit.  Impression: Technically successful ultrasound guided  injection.   TECHNIQUE: Multiplanar, multisequence MR imaging of the lumbar spine was performed. No intravenous contrast was administered.   COMPARISON:  Lumbar spine MRI 11/29/2020   FINDINGS: Segmentation: Standard; the lowest formed disc space is designated L5-S1.   Alignment: There is exaggerated lumbar lordosis with trace retrolisthesis of T12 on L1 through L2 on L3 and trace anterolisthesis of L4 on L5 and L5 on S1, unchanged. A possible left pars defect is again noted L5-S1.   Vertebrae: Vertebral body heights are preserved. Background marrow signal is normal. There is degenerative endplate signal abnormality at T11-T12 and T12-L1 without edema, unchanged. There is no suspicious marrow signal abnormality or marrow edema.   Conus medullaris and cauda equina: Conus extends to the T12-L1 level. Conus and cauda equina appear normal.   Paraspinal and other soft tissues: Unremarkable.   Disc levels:   There is overall mild multilevel disc desiccation and narrowing throughout the lower thoracic and lumbar spine.   T10-T11: Small central protrusion resulting in mild spinal canal stenosis without significant neural foraminal stenosis, unchanged   T11-T12: No significant spinal canal or neural foraminal stenosis.   T12-L1: There is trace retrolisthesis with a shallow disc protrusion but no significant spinal canal or neural foraminal stenosis, unchanged.   L1-L2: There is trace retrolisthesis with mild disc bulge but no significant spinal canal or neural foraminal stenosis, unchanged.   L2-L3: There is a mild disc bulge, right-sided endplate spurring, and mild facet arthropathy resulting in moderate right and no significant left neural foraminal stenosis and no significant spinal canal stenosis, unchanged.   L3-L4: There is mild left worse than right facet arthropathy resulting in mild left worse than right neural foraminal stenosis without significant spinal canal  stenosis, unchanged.   L4-L5: There is trace anterolisthesis and advanced left worse than right facet arthropathy resulting mild left and no significant right neural foraminal stenosis and mild left subarticular zone narrowing, unchanged.   L5-S1: There is trace anterolisthesis with moderate bilateral facet arthropathy but no significant spinal canal or neural foraminal stenosis, unchanged.   IMPRESSION: 1. No significant interval change since the study from 11/29/2020. 2. Moderate to advanced facet arthropathy at L4-L5 and L5-S1 with mild left subarticular zone and foraminal narrowing at L4-L5, unchanged. 3. Moderate right neural foraminal stenosis at L2-L3, unchanged. 4. Otherwise overall mild degenerative changes as above.     Electronically Signed   By: Lesia Hausen M.D.   On: 03/10/2023 07:48 I, [name], personally (independently) visualized and performed the interpretation of the images attached in this note.        Assessment and Plan: 57 y.o. female with acute exacerbation of chronic knee pain due to exacerbation of DJD and fall.  Plan for steroid injection today.  This should help minimize pain and swelling and allow for a more pleasant and enjoyable vacation to [place] in 11 days.   Additionally she continues to experience chronic back pain.  She was seen recently for chronic back pain and had a epidural steroid injection with mild to little benefit.  In the past she has had facet block and ablation attempt at bilateral facet  joints L4-5 with little benefit from the block.  Her pain continues and is worsening.  After discussion we will refer to Dr. Lorrine Kin for second opinion regarding procedural options.  She may benefit from facet block and ablation L5-S1 bilateral facet joints.  She does have slightly worsened lower extremity edema.  This is probably secondary to her chronic venous insufficiency and perhaps some weight gain.  She already is using compression  stockings sometimes.  Encouraged her to use it more frequently.  I encouraged her to contact her primary care provider office to see if they will prescribe her diuretics.  Will check a metabolic panel now.  If no one else will prescribe her some diuretics I will prior to her trip to [place] but ideally this should really come from her medical team.   PDMP not reviewed this encounter. Orders Placed This Encounter  Procedures   [place] LIMITED JOINT SPACE STRUCTURES LOW BILAT(NO LINKED CHARGES)    Order Specific Question:   Reason for Exam (SYMPTOM  OR DIAGNOSIS REQUIRED)    Answer:   knee inj    Order Specific Question:   Preferred imaging location?    Answer:    [facility]   Comprehensive metabolic panel    Standing Status:   Future    Number of Occurrences:   1    Standing Expiration Date:   03/09/2024   Ambulatory referral to Pain Clinic    Referral Priority:   Routine    Referral Type:   Consultation    Referral Reason:   Specialty Services Required    Referred to Provider:   Renaldo Fiddler, MD    Requested Specialty:   Pain Medicine    Number of Visits Requested:   1   No orders of the defined types were placed in this encounter.    Discussed warning signs or symptoms. Please see discharge instructions. Patient expresses understanding.   The above documentation has been reviewed and is accurate and complete [name], M.D.

## 2023-03-10 NOTE — Patient Instructions (Addendum)
Thank you for coming in today.  You received an injection today. Seek immediate medical attention if the joint becomes red, extremely painful, or is oozing fluid.  A referral has been placed to [facility] to see Dr. Lorrine Kin. Please let us know if you don't hear from there office within the next 1-2 weeks.  Look in to Compression stockings.  Stop by lab before you go.  Reach out to Dr. Lawerance Bach about a fluid pill for the swelling in your legs.

## 2023-03-11 NOTE — Progress Notes (Signed)
We discussed the other day you do have arthritis changes in your back could benefit from injection.

## 2023-03-11 NOTE — Progress Notes (Signed)
Potassium is a little bit elevated and kidney function has worsened slightly over the last year.  The Lasix that Dr. Lawerance Bach prescribed will make the potassium go down.  So Dr. Lawerance Bach is going to want to recheck it again in a week as she said in her message.

## 2023-03-17 ENCOUNTER — Other Ambulatory Visit (HOSPITAL_COMMUNITY): Payer: Self-pay

## 2023-03-17 ENCOUNTER — Telehealth: Payer: Self-pay

## 2023-03-17 NOTE — Telephone Encounter (Signed)
Pharmacy Patient Advocate Encounter   Received notification from CoverMyMeds that prior authorization for HYDROcodone-Acetaminophen 7.5-325MG  tablets is required/requested.   Insurance verification completed.   The patient is insured through [facility] .   Per test claim: PA required; PA submitted to [facility] via CoverMyMeds Key/confirmation #/EOC [id number] Status is pending

## 2023-03-18 ENCOUNTER — Other Ambulatory Visit: Payer: Self-pay | Admitting: Family Medicine

## 2023-03-18 ENCOUNTER — Other Ambulatory Visit: Payer: Self-pay | Admitting: Internal Medicine

## 2023-03-18 ENCOUNTER — Other Ambulatory Visit (INDEPENDENT_AMBULATORY_CARE_PROVIDER_SITE_OTHER): Payer: Medicaid Other

## 2023-03-18 ENCOUNTER — Other Ambulatory Visit (HOSPITAL_COMMUNITY): Payer: Self-pay

## 2023-03-18 DIAGNOSIS — R600 Localized edema: Secondary | ICD-10-CM

## 2023-03-18 LAB — BASIC METABOLIC PANEL
BUN: 17 mg/dL (ref 6–23)
CO2: 35 meq/L — ABNORMAL HIGH (ref 19–32)
Calcium: 9.7 mg/dL (ref 8.4–10.5)
Chloride: 98 meq/L (ref 96–112)
Creatinine, Ser: 1.04 mg/dL (ref 0.40–1.20)
GFR: 59.3 mL/min — ABNORMAL LOW (ref >60.00)
Glucose, Bld: 81 mg/dL (ref 70–99)
Potassium: 4.1 meq/L (ref 3.5–5.1)
Sodium: 139 meq/L (ref 135–145)

## 2023-03-18 NOTE — Telephone Encounter (Signed)
Pharmacy Patient Advocate Encounter  Received notification from [facility] that Prior Authorization for HYDROcodone-Acetaminophen 7.5-325MG  tablets has been APPROVED from 03/17/23 to 09/12/23. Ran test claim, Copay is $4. This test claim was processed through [facility]- copay amounts may vary at other pharmacies due to pharmacy/plan contracts, or as the patient moves through the different stages of their insurance plan.   PA #/Case ID/Reference #:  [id number]   Called pharmacy, patient picked up and paid cash.

## 2023-03-21 NOTE — Telephone Encounter (Signed)
Pt has abnormal renal labs, forwarding to Dr. Denyse Amass to approve/deny.

## 2023-04-19 ENCOUNTER — Telehealth: Payer: Self-pay

## 2023-04-19 ENCOUNTER — Ambulatory Visit: Payer: Medicaid Other | Admitting: Internal Medicine

## 2023-04-19 ENCOUNTER — Encounter: Payer: Self-pay | Admitting: Internal Medicine

## 2023-04-19 VITALS — BP 130/80 | HR 106 | Temp 98.0°F | Ht 63.0 in | Wt 273.0 lb

## 2023-04-19 DIAGNOSIS — Z6841 Body Mass Index (BMI) 40.0 and over, adult: Secondary | ICD-10-CM

## 2023-04-19 DIAGNOSIS — E66813 Obesity, class 3: Secondary | ICD-10-CM

## 2023-04-19 DIAGNOSIS — F909 Attention-deficit hyperactivity disorder, unspecified type: Secondary | ICD-10-CM

## 2023-04-19 DIAGNOSIS — M7989 Other specified soft tissue disorders: Secondary | ICD-10-CM | POA: Diagnosis not present

## 2023-04-19 DIAGNOSIS — E538 Deficiency of other specified B group vitamins: Secondary | ICD-10-CM | POA: Diagnosis not present

## 2023-04-19 DIAGNOSIS — G9332 Myalgic encephalomyelitis/chronic fatigue syndrome: Secondary | ICD-10-CM

## 2023-04-19 DIAGNOSIS — F419 Anxiety disorder, unspecified: Secondary | ICD-10-CM

## 2023-04-19 DIAGNOSIS — R600 Localized edema: Secondary | ICD-10-CM

## 2023-04-19 LAB — CBC WITH DIFFERENTIAL/PLATELET
Basophils Absolute: 0.1 10*3/uL (ref 0.0–0.1)
Basophils Relative: 0.7 % (ref 0.0–3.0)
Eosinophils Absolute: 0.1 10*3/uL (ref 0.0–0.7)
Eosinophils Relative: 0.9 % (ref 0.0–5.0)
HCT: 48.1 % — ABNORMAL HIGH (ref 36.0–46.0)
Hemoglobin: 15.9 g/dL — ABNORMAL HIGH (ref 12.0–15.0)
Lymphocytes Relative: 16.9 % (ref 12.0–46.0)
Lymphs Abs: 1.4 10*3/uL (ref 0.7–4.0)
MCHC: 33 g/dL (ref 30.0–36.0)
MCV: 95.7 fL (ref 78.0–100.0)
Monocytes Absolute: 0.7 10*3/uL (ref 0.1–1.0)
Monocytes Relative: 8.4 % (ref 3.0–12.0)
Neutro Abs: 6.2 10*3/uL (ref 1.4–7.7)
Neutrophils Relative %: 73.1 % (ref 43.0–77.0)
Platelets: 381 10*3/uL (ref 150.0–400.0)
RBC: 5.03 Mil/uL (ref 3.87–5.11)
RDW: 13.9 % (ref 11.5–15.5)
WBC: 8.5 10*3/uL (ref 4.0–10.5)

## 2023-04-19 LAB — COMPREHENSIVE METABOLIC PANEL
ALT: 18 U/L (ref 0–35)
AST: 16 U/L (ref 0–37)
Albumin: 4.3 g/dL (ref 3.5–5.2)
Alkaline Phosphatase: 93 U/L (ref 39–117)
BUN: 20 mg/dL (ref 6–23)
CO2: 32 meq/L (ref 19–32)
Calcium: 10 mg/dL (ref 8.4–10.5)
Chloride: 99 meq/L (ref 96–112)
Creatinine, Ser: 1.08 mg/dL (ref 0.40–1.20)
GFR: 56.64 mL/min — ABNORMAL LOW (ref >60.00)
Glucose, Bld: 100 mg/dL — ABNORMAL HIGH (ref 70–99)
Potassium: 4.5 meq/L (ref 3.5–5.1)
Sodium: 140 meq/L (ref 135–145)
Total Bilirubin: 0.2 mg/dL (ref 0.2–1.2)
Total Protein: 7.6 g/dL (ref 6.0–8.3)

## 2023-04-19 LAB — URINALYSIS, ROUTINE W REFLEX MICROSCOPIC
Bilirubin Urine: NEGATIVE
Hgb urine dipstick: NEGATIVE
Ketones, ur: NEGATIVE
Nitrite: NEGATIVE
Specific Gravity, Urine: 1.01 (ref 1.000–1.030)
Total Protein, Urine: NEGATIVE
Urine Glucose: NEGATIVE
Urobilinogen, UA: 0.2 (ref 0.0–1.0)
pH: 6.5 (ref 5.0–8.0)

## 2023-04-19 LAB — HEMOGLOBIN A1C: Hgb A1c MFr Bld: 6.3 % (ref 4.6–6.5)

## 2023-04-19 MED ORDER — FUROSEMIDE 20 MG PO TABS: 20.0000 mg | ORAL_TABLET | Freq: Every day | ORAL | 3 refills | Status: DC

## 2023-04-19 MED ORDER — CYCLOBENZAPRINE HCL 10 MG PO TABS: 10.0000 mg | ORAL_TABLET | Freq: Three times a day (TID) | ORAL | 1 refills | Status: DC | PRN

## 2023-04-19 MED ORDER — LEVOTHYROXINE SODIUM 100 MCG PO TABS: ORAL_TABLET | ORAL | 3 refills | Status: DC

## 2023-04-19 MED ORDER — HYDROCODONE-ACETAMINOPHEN 7.5-325 MG PO TABS: 1.0000 | ORAL_TABLET | Freq: Three times a day (TID) | ORAL | 0 refills | Status: DC | PRN

## 2023-04-19 MED ORDER — CALCITRIOL 0.5 MCG PO CAPS: 0.5000 ug | ORAL_CAPSULE | Freq: Two times a day (BID) | ORAL | 11 refills | Status: DC

## 2023-04-19 MED ORDER — ALPRAZOLAM 0.5 MG PO TABS
ORAL_TABLET | ORAL | 3 refills | Status: DC
Start: 2023-04-19 — End: 2023-07-07

## 2023-04-19 MED ORDER — IBUPROFEN 800 MG PO TABS: 800.0000 mg | ORAL_TABLET | Freq: Three times a day (TID) | ORAL | 2 refills | Status: DC | PRN

## 2023-04-19 MED ORDER — WEGOVY 0.25 MG/0.5ML ~~LOC~~ SOAJ: 0.2500 mg | SUBCUTANEOUS | 2 refills | Status: DC

## 2023-04-19 MED ORDER — PANTOPRAZOLE SODIUM 40 MG PO TBEC: 40.0000 mg | DELAYED_RELEASE_TABLET | Freq: Two times a day (BID) | ORAL | 1 refills | Status: DC

## 2023-04-19 MED ORDER — CYANOCOBALAMIN 1000 MCG/ML IJ SOLN: 1000.0000 ug | INTRAMUSCULAR | 3 refills | Status: DC

## 2023-04-19 MED ORDER — AMPHETAMINE-DEXTROAMPHETAMINE 5 MG PO TABS: ORAL_TABLET | ORAL | 0 refills | Status: DC

## 2023-04-19 MED ORDER — ONDANSETRON HCL 4 MG PO TABS: 4.0000 mg | ORAL_TABLET | Freq: Three times a day (TID) | ORAL | 1 refills | Status: DC | PRN

## 2023-04-19 MED ORDER — BUPROPION HCL ER (SR) 200 MG PO TB12: 200.0000 mg | ORAL_TABLET | Freq: Every day | ORAL | 3 refills | Status: DC

## 2023-04-19 NOTE — Assessment & Plan Note (Signed)
Off Phentermine  Potential benefits of a long term phentermine  use as well as potential risks  and complications were explained to the patient and were aknowledged. Reginal Lutes is not covered Will start with [facility]

## 2023-04-19 NOTE — Progress Notes (Signed)
Subjective:  Patient ID: Tiffany Medina, female    DOB: [birth year]  Age: 57 y.o. MRN: [id number]  CC: Follow-up (2 mnth f/u, Discuss recent potassium labs)   HPI Tiffany Medina presents for chronic pain, ADD, anxiety, edema On Lasix - better  Outpatient Medications Prior to Visit  Medication Sig Dispense Refill   albuterol (PROVENTIL HFA) 108 (90 Base) MCG/ACT inhaler Inhale 2 puffs into the lungs every 4 (four) hours as needed for wheezing or shortness of breath. 8.5 g 11   ALPRAZolam (XANAX) 0.5 MG tablet TAKE 3 TABLETS BY MOUTH EVERY DAY AT BEDTIME AS NEEDED 90 tablet 3   amphetamine-dextroamphetamine (ADDERALL) 5 MG tablet 5 mg in am, 5 mg at lunch 60 tablet 0   ASPIRIN 81 PO Take 2 tablets by mouth daily.     buPROPion (WELLBUTRIN SR) 200 MG 12 hr tablet Take 1 tablet (200 mg total) by mouth daily. 90 tablet 3   calcitRIOL (ROCALTROL) 0.5 MCG capsule Take 1 capsule (0.5 mcg total) by mouth in the morning and at bedtime. 60 capsule 11   cyanocobalamin (VITAMIN B12) 1000 MCG/ML injection Inject 1 mL (1,000 mcg total) into the muscle every 14 (fourteen) days. 6 mL 3   cyclobenzaprine (FLEXERIL) 10 MG tablet TAKE 1 TABLET BY MOUTH THREE TIMES A DAY AS NEEDED 90 tablet 1   diphenoxylate-atropine (LOMOTIL) 2.5-0.025 MG tablet Take 1 tablet by mouth 4 (four) times daily as needed for diarrhea or loose stools. 60 tablet 1   estradiol (VIVELLE-DOT) 0.1 MG/24HR patch APPLY 1 PATCH TRANSDERMALLY TWICE A WEEK     furosemide (LASIX) 20 MG tablet Take 1 tablet (20 mg total) by mouth daily. 30 tablet 3   HYDROcodone-acetaminophen (NORCO) 7.5-325 MG tablet Take 1 tablet by mouth every 8 (eight) hours as needed for severe pain. 90 tablet 0   ibuprofen (ADVIL) 800 MG tablet TAKE 1 TABLET BY MOUTH EVERY 8 HOURS AS NEEDED 90 tablet 2   levothyroxine (SYNTHROID) 100 MCG tablet TAKE 1 TABLET BY MOUTH ONCE DAILY BEFORE BREAKFAST (Patient taking differently: TAKE 1/2 TABLET BY MOUTH ONCE DAILY BEFORE  BREAKFAST ( )) 90 tablet 3   ondansetron (ZOFRAN) 4 MG tablet Take 1 tablet (4 mg total) by mouth every 8 (eight) hours as needed for nausea or vomiting. 20 tablet 1   pantoprazole (PROTONIX) 40 MG tablet TAKE 1 TABLET TWICE DAILY 180 tablet 1   Semaglutide-Weight Management (WEGOVY) 0.25 MG/0.5ML SOAJ Inject 0.25 mg into the skin once a week. 2 mL 2   cefdinir (OMNICEF) 300 MG capsule Take 1 capsule (300 mg total) by mouth 2 (two) times daily. 20 capsule 0   chlorpheniramine-HYDROcodone (TUSSIONEX) 10-8 MG/5ML Take 5 mLs by mouth every 12 (twelve) hours as needed. 115 mL 0   No facility-administered medications prior to visit.    ROS: Review of Systems  Constitutional:  Negative for activity change, appetite change, chills, fatigue and unexpected weight change.  HENT:  Negative for congestion, mouth sores and sinus pressure.   Eyes:  Negative for visual disturbance.  Respiratory:  Negative for cough and chest tightness.   Gastrointestinal:  Negative for abdominal pain and nausea.  Genitourinary:  Negative for difficulty urinating, frequency and vaginal pain.  Musculoskeletal:  Positive for arthralgias, back pain and gait problem.  Skin:  Negative for pallor and rash.  Neurological:  Negative for dizziness, tremors, weakness, numbness and headaches.  Hematological:  Bruises/bleeds easily.  Psychiatric/Behavioral:  Negative for confusion, sleep disturbance and suicidal ideas. The patient  is nervous/anxious.     Objective:  BP 130/80 (BP Location: Left Arm, Patient Position: Sitting, Cuff Size: Normal)   Pulse (!) 106   Temp 98 F (36.7 C) (Oral)   Ht 5\' 3"  (1.6 m)   Wt 273 lb (123.8 kg)   SpO2 93%   BMI 48.36 kg/m   BP Readings from Last 3 Encounters:  04/19/23 130/80  03/10/23 (!) 142/94  03/09/23 134/78    Wt Readings from Last 3 Encounters:  04/19/23 273 lb (123.8 kg)  03/10/23 276 lb 9.6 oz (125.5 kg)  03/09/23 275 lb (124.7 kg)    Physical Exam Constitutional:       General: She is not in acute distress.    Appearance: She is well-developed. She is obese.  HENT:     Head: Normocephalic.     Right Ear: External ear normal.     Left Ear: External ear normal.     Nose: Nose normal.  Eyes:     General:        Right eye: No discharge.        Left eye: No discharge.     Conjunctiva/sclera: Conjunctivae normal.     Pupils: Pupils are equal, round, and reactive to light.  Neck:     Thyroid: No thyromegaly.     Vascular: No JVD.     Trachea: No tracheal deviation.  Cardiovascular:     Rate and Rhythm: Normal rate and regular rhythm.     Heart sounds: Normal heart sounds.  Pulmonary:     Effort: No respiratory distress.     Breath sounds: No stridor. No wheezing.  Abdominal:     General: Bowel sounds are normal. There is no distension.     Palpations: Abdomen is soft. There is no mass.     Tenderness: There is no abdominal tenderness. There is no guarding or rebound.  Musculoskeletal:        General: Tenderness present.     Cervical back: Normal range of motion and neck supple. No rigidity.     Right lower leg: Edema present.     Left lower leg: Edema present.  Lymphadenopathy:     Cervical: No cervical adenopathy.  Skin:    Findings: No erythema or rash.  Neurological:     Mental Status: She is oriented to person, place, and time.     Cranial Nerves: No cranial nerve deficit.     Motor: No abnormal muscle tone.     Coordination: Coordination normal.     Deep Tendon Reflexes: Reflexes normal.  Psychiatric:        Behavior: Behavior normal.        Thought Content: Thought content normal.        Judgment: Judgment normal.     Lab Results  Component Value Date   WBC 6.8 11/24/2022   HGB 14.9 11/24/2022   HCT 45.1 11/24/2022   PLT 332 11/24/2022   GLUCOSE 81 03/18/2023   CHOL 186 11/24/2022   TRIG 74 11/24/2022   HDL 68 11/24/2022   LDLCALC 104 (H) 11/24/2022   ALT 17 03/10/2023   AST 21 03/10/2023   NA 139 03/18/2023   K  4.1 03/18/2023   CL 98 03/18/2023   CREATININE 1.04 03/18/2023   BUN 17 03/18/2023   CO2 35 (H) 03/18/2023   TSH 1.47 09/29/2022   INR 1.02 11/28/2014   HGBA1C 6.0 (H) 11/24/2022    DG INJECT DIAG/THERA/INC NEEDLE/CATH/PLC EPI/LUMB/SAC W/IMG  Result Date:  01/25/2023 CLINICAL DATA:  Low back pain radiating to the left leg all the way to the foot. FLUOROSCOPY: Radiation Exposure Index (as provided by the fluoroscopic device): 0 minutes 42 seconds. 70.58 micro gray meter squared PROCEDURE: The procedure, risks, benefits, and alternatives were explained to the patient. Questions regarding the procedure were encouraged and answered. The patient understands and consents to the procedure. LUMBAR EPIDURAL INJECTION: An interlaminar approach was performed on the left at L5-S1. The overlying skin was cleansed and anesthetized. A 20 gauge epidural needle was advanced using loss-of-resistance technique. DIAGNOSTIC EPIDURAL INJECTION: Injection of Isovue-M 200 shows a good epidural pattern with spread above and below the level of needle placement, primarily on the left no vascular opacification is seen. THERAPEUTIC EPIDURAL INJECTION: Eighty mg of Depo-Medrol mixed with 2 cc 1% lidocaine were instilled. The procedure was well-tolerated, and the patient was discharged thirty minutes following the injection in good condition. COMPLICATIONS: None IMPRESSION: Technically successful L5-S1 left epidural steroid injection. Electronically Signed   By: Paulina Fusi M.D.   On: 01/25/2023 15:02    Assessment & Plan:   Problem List Items Addressed This Visit     CFS (chronic fatigue syndrome)    Stress reduction       ADD (attention deficit disorder) - Primary    Provigil, nuvigil - not covered; adderall is covered Will Rx Adderall - monitor HR  Potential benefits of a long term amphetamines  use as well as potential risks  and complications were explained to the patient and were aknowledged.      Edema     Chronic ven insufficiency Compr socks Wt loss Lasix prn      Class 3 severe obesity with serious comorbidity and body mass index (BMI) of 45.0 to 49.9 in adult [facility])    Off Phentermine  Potential benefits of a long term phentermine  use as well as potential risks  and complications were explained to the patient and were aknowledged. Reginal Lutes is not covered Will start with [facility]      Anxiety disorder    Xanax as needed  Potential benefits of a long term benzodiazepines  use as well as potential risks  and complications were explained to the patient and were aknowledged.      B12 deficiency    On B12      Leg swelling    Furosemide prn         No orders of the defined types were placed in this encounter.     Follow-up: No follow-ups on file.  Sonda Primes, MD

## 2023-04-19 NOTE — Assessment & Plan Note (Signed)
Provigil, nuvigil - not covered; adderall is covered Will Rx Adderall - monitor HR  Potential benefits of a long term amphetamines  use as well as potential risks  and complications were explained to the patient and were aknowledged.

## 2023-04-19 NOTE — Assessment & Plan Note (Signed)
On B12 

## 2023-04-19 NOTE — Assessment & Plan Note (Signed)
Furosemide prn

## 2023-04-19 NOTE — Assessment & Plan Note (Signed)
Xanax as needed  Potential benefits of a long term benzodiazepines  use as well as potential risks  and complications were explained to the patient and were aknowledged.

## 2023-04-19 NOTE — Assessment & Plan Note (Signed)
Chronic ven insufficiency Compr socks Wt loss Lasix prn

## 2023-04-19 NOTE — Assessment & Plan Note (Signed)
Stress reduction

## 2023-04-19 NOTE — Telephone Encounter (Signed)
PA has been started for [facility].  Key: [id number]

## 2023-04-20 LAB — PMP SCREEN PROFILE (10S), URINE
Amphetamine Scrn, Ur: NEGATIVE ng/mL
BARBITURATE SCREEN URINE: NEGATIVE ng/mL
BENZODIAZEPINE SCREEN, URINE: POSITIVE ng/mL — AB
CANNABINOIDS UR QL SCN: NEGATIVE ng/mL
Cocaine (Metab) Scrn, Ur: NEGATIVE ng/mL
Creatinine(Crt), U: 30.6 mg/dL (ref 20.0–300.0)
Methadone Screen, Urine: NEGATIVE ng/mL
OXYCODONE+OXYMORPHONE UR QL SCN: NEGATIVE ng/mL
Opiate Scrn, Ur: POSITIVE ng/mL — AB
Ph of Urine: 6.2 (ref 4.5–8.9)
Phencyclidine Qn, Ur: NEGATIVE ng/mL
Propoxyphene Scrn, Ur: NEGATIVE ng/mL

## 2023-04-25 NOTE — Telephone Encounter (Signed)
Pharmacy Patient Advocate Encounter  Received notification from [facility] that Prior Authorization for Wegovy 0.25 MG/0.5 ML Pens has been DENIED.  Full denial letter will be uploaded to the media tab. See denial reason below.  This is denied because of the following reason: because we did not see what we need to approve the drug you asked for, [facility]). We may be able to approve this drug in a certain situation (when your baseline weight [including whether in pounds or kilograms] and bocy mass index [BMI] measured within the past 45 days of this prior authorization request and the date of these baseline measurements have been provided).   PA #/Case ID/Reference #: [id number]   Please Route back to Rx PRIOR AUTH TEAM POOL with appeal letter, for appeals support. Full Denial letter will be uploaded to media tab. Appeals contact info: Phone: [phone], or Fax: xxx

## 2023-05-23 ENCOUNTER — Ambulatory Visit (INDEPENDENT_AMBULATORY_CARE_PROVIDER_SITE_OTHER): Payer: Medicaid Other | Admitting: Family Medicine

## 2023-05-23 ENCOUNTER — Ambulatory Visit (INDEPENDENT_AMBULATORY_CARE_PROVIDER_SITE_OTHER): Payer: PRIVATE HEALTH INSURANCE | Admitting: Family Medicine

## 2023-05-23 ENCOUNTER — Encounter (INDEPENDENT_AMBULATORY_CARE_PROVIDER_SITE_OTHER): Payer: Self-pay | Admitting: Family Medicine

## 2023-05-23 DIAGNOSIS — M170 Bilateral primary osteoarthritis of knee: Secondary | ICD-10-CM

## 2023-05-23 DIAGNOSIS — E669 Obesity, unspecified: Secondary | ICD-10-CM | POA: Diagnosis not present

## 2023-05-23 DIAGNOSIS — R7303 Prediabetes: Secondary | ICD-10-CM

## 2023-05-23 DIAGNOSIS — Z6841 Body Mass Index (BMI) 40.0 and over, adult: Secondary | ICD-10-CM

## 2023-05-23 MED ORDER — WEGOVY 0.5 MG/0.5ML ~~LOC~~ SOAJ
0.5000 mg | SUBCUTANEOUS | 0 refills | Status: DC
Start: 2023-05-23 — End: 2023-11-03

## 2023-05-23 NOTE — Progress Notes (Signed)
[name], D.O.  ABFM, ABOM Specializing in Clinical Bariatric Medicine  Office located at: [address] [place], [place]  [address]   Assessment and Plan:   FOR THE DISEASE OF OBESITY: Obesity, Beginning BMI 47.3 -     Wegovy; Inject 0.5 mg into the skin once a week.  Dispense: 2 mL; Refill: 0 BMI 45.0-49.9, adult (HCC) current bmi -47.49 -     UEAVWU; Inject 0.5 mg into the skin once a week.  Dispense: 2 mL; Refill: 0 Since last office visit on 02/09/2023 patient's  Muscle mass has decreased by 17.6lb. Fat mass has increased by 16.2lb. Total body water has increased by 0.4lb.  Counseling done on how various foods will affect these numbers and how to maximize success  Total lbs lost to date: +2 Total weight loss percentage to date: +0.75   Recommended Dietary Goals Tiffany Medina is currently in the action stage of change. As such, her goal is to continue weight management plan.  She has agreed to: continue current plan   Behavioral Intervention We discussed the following Behavioral Modification Strategies today: increasing lean protein intake to established goals and planning for success  Additional resources provided today: handout on CAT 3 meal plan, Handout on CAT 3-4 breakfast options, Handout on CAT 3-4 lunch options, and Thanksgiving eating strategies  Evidence-based interventions for health behavior change were utilized today including the discussion of self monitoring techniques, problem-solving barriers and SMART goal setting techniques.   Regarding patient's less desirable eating habits and patterns, we employed the technique of small changes.   Pt will specifically work on: measure her food and restart meal plan for next visit.    Recommended Physical Activity Goals Tiffany Medina has been advised to work up to 150 minutes of moderate intensity aerobic activity a week and strengthening exercises 2-3 times per week for cardiovascular health, weight loss  maintenance and preservation of muscle mass.   She has agreed to :  Think about enjoyable ways to increase daily physical activity and overcoming barriers to exercise and Increase physical activity in their day and reduce sedentary time (increase NEAT).   Pharmacotherapy We discussed various medication options to help Tiffany Medina with her weight loss efforts and we both agreed to : continue with nutritional and behavioral strategies and continue current anti-obesity medication regimen   FOR ASSOCIATED CONDITIONS ADDRESSED TODAY: Prediabetes Assessment: Condition is Not optimized.. Pt states that she is finally taking Wegovy as this is now covered by her insurance. She informed me that she continues this once weekly without difficulty and is on her second week. She denies any adverse side effects. She feels as her hunger and cravings are not well controlled. She feels as an increase in dosage will help her carb cravings. She informed me that she was previously on metformin but experienced nausea and vomiting so this was discontinued. Pt had worsening A1c.  Lab Results  Component Value Date   HGBA1C 6.3 04/19/2023   HGBA1C 6.0 (H) 11/24/2022   HGBA1C 6.1 07/30/2022   INSULIN 18.2 11/24/2022   INSULIN 12.3 02/02/2018   INSULIN 11.5 08/08/2017    Plan: - Continue Wegovy from 0.5mg  once weekly. I will refill.   - Her PCP obtained labs and her A1c worsened from 6.0 to 6.3 due to this we will increase her Wegovy dosage.   - Continue to decrease simple carbs/ sugars; increase fiber and proteins -> follow her meal plan.    - Explained role of simple carbs  and insulin levels on hunger and craving.   - Anticipatory guidance given.    Tiffany Medina will continue to work on weight loss, exercise, via their meal plan we devised to help decrease the risk of progressing to diabetes.    Primary osteoarthritis of both knees Assessment: Condition is Not optimized.. Pt informed me that she has multiple  comorbidities especially involving herniated disc, osteoarthritis of bilateral knees, and more. She takes medication for her knee pain.   Plan: - Continued physical activity/weight loss will help strengthen will help aid in muscle/joint relief.   FOLLOW UP: Return in about 1 month (around 06/22/2023). She was informed of the importance of frequent follow up visits to maximize her success with intensive lifestyle modifications for her multiple health conditions.  Subjective:   Chief complaint: Obesity Tiffany Medina is here to discuss her progress with her obesity treatment plan. She is on the the Category 3 Plan and states she is following her eating plan approximately 0% of the time. She states she is not exercising.  Interval History:  Tiffany Medina is here for a follow up office visit. She was last seen on 02/09/2023 and was usually seen by Dr. Dalbert Garnet and Dr. Cathey Endow. She informed me that she is here to reset her meal plan. She informed me due to her health issues she had to go on disability and is having a hard time following the meal plan. Pt does not exercise at this moment.   Barriers identified: multiple competing priorities, cost of medication, and medical comorbidities.   Pharmacotherapy for weight loss: She is currently taking Wegovy with adequate clinical response  and without side effects..   Review of Systems:  Pertinent positives were addressed with patient today.  Reviewed by clinician on day of visit: allergies, medications, problem list, medical history, surgical history, family history, social history, and previous encounter notes.  Weight Summary and Biometrics   Weight Lost Since Last Visit: 2lb  Weight Gained Since Last Visit: 0lb  Vitals Temp: 98 F (36.7 C) BP: 138/85 Pulse Rate: 92 SpO2: 99 %   Anthropometric Measurements Height: 5\' 3"  (1.6 m) Weight: 268 lb (121.6 kg) BMI (Calculated): 47.49 Weight at Last Visit: 270lb Weight Lost Since Last Visit:  2lb Weight Gained Since Last Visit: 0lb Starting Weight: 266lb   Body Composition  Body Fat %: 54.4 % Fat Mass (lbs): 146 lbs Muscle Mass (lbs): 116 lbs Total Body Water (lbs): 89.6 lbs Visceral Fat Rating : 20   Other Clinical Data Fasting: no Labs: no Today's Visit #: 5 Starting Date: 11/24/22    Objective:   PHYSICAL EXAM: Blood pressure 138/85, pulse 92, temperature 98 F (36.7 C), height 5\' 3"  (1.6 m), weight 268 lb (121.6 kg), SpO2 99%. Body mass index is 47.47 kg/m.  General: she is overweight, cooperative and in no acute distress. PSYCH: Has normal mood, affect and thought process.   HEENT: EOMI, sclerae are anicteric. Lungs: Normal breathing effort, no conversational dyspnea. Extremities: Moves * 4 Neurologic: A and O * 3, good insight  DIAGNOSTIC DATA REVIEWED: BMET    Component Value Date/Time   NA 140 04/19/2023 1113   NA 141 11/24/2022 1055   K 4.5 04/19/2023 1113   CL 99 04/19/2023 1113   CO2 32 04/19/2023 1113   GLUCOSE 100 (H) 04/19/2023 1113   BUN 20 04/19/2023 1113   BUN 25 (H) 11/24/2022 1055   CREATININE 1.08 04/19/2023 1113   CALCIUM 10.0 04/19/2023 1113   GFRNONAA 97 08/08/2017  6578   GFRAA 111 08/08/2017 0844   Lab Results  Component Value Date   HGBA1C 6.3 04/19/2023   HGBA1C 6.0 (H) 04/11/2017   Lab Results  Component Value Date   INSULIN 18.2 11/24/2022   INSULIN 23.7 04/11/2017   Lab Results  Component Value Date   TSH 1.47 09/29/2022   CBC    Component Value Date/Time   WBC 8.5 04/19/2023 1113   RBC 5.03 04/19/2023 1113   HGB 15.9 (H) 04/19/2023 1113   HGB 14.9 11/24/2022 1055   HGB 14.4 12/25/2010 1504   HCT 48.1 (H) 04/19/2023 1113   HCT 45.1 11/24/2022 1055   HCT 41.5 12/25/2010 1504   PLT 381.0 04/19/2023 1113   PLT 332 11/24/2022 1055   MCV 95.7 04/19/2023 1113   MCV 95 11/24/2022 1055   MCV 94.7 12/25/2010 1504   MCH 31.4 11/24/2022 1055   MCH 31.7 12/02/2016 1756   MCHC 33.0 04/19/2023 1113   RDW  13.9 04/19/2023 1113   RDW 12.2 11/24/2022 1055   RDW 13.0 12/25/2010 1504   Iron Studies No results found for: "IRON", "TIBC", "FERRITIN", "IRONPCTSAT" Lipid Panel     Component Value Date/Time   CHOL 186 11/24/2022 1055   TRIG 74 11/24/2022 1055   HDL 68 11/24/2022 1055   CHOLHDL 2.7 11/24/2022 1055   CHOLHDL 3 04/20/2021 0940   VLDL 25.6 04/20/2021 0940   LDLCALC 104 (H) 11/24/2022 1055   Hepatic Function Panel     Component Value Date/Time   PROT 7.6 04/19/2023 1113   PROT 6.5 11/24/2022 1055   ALBUMIN 4.3 04/19/2023 1113   ALBUMIN 4.2 11/24/2022 1055   AST 16 04/19/2023 1113   ALT 18 04/19/2023 1113   ALKPHOS 93 04/19/2023 1113   BILITOT 0.2 04/19/2023 1113   BILITOT <0.2 11/24/2022 1055   BILIDIR 0.1 08/21/2018 1430      Component Value Date/Time   TSH 1.47 09/29/2022 1619   Nutritional Lab Results  Component Value Date   VD25OH 13.4 (L) 11/24/2022   VD25OH 14.88 (L) 07/30/2022   VD25OH 12.72 (L) 02/01/2022    Attestations:   I, Clinical biochemist, acting as a Stage manager for [employer], DO., have compiled all relevant documentation for today's office visit on behalf of Thomasene Lot, DO, while in the presence of [employer], DO.  Reviewed by clinician on day of visit: allergies, medications, problem list, medical history, surgical history, family history, social history, and previous encounter notes pertinent to patient's obesity diagnosis. preparing to see patient (e.g. review and interpretation of tests, old notes ), obtaining and/or reviewing separately obtained history, performing a medically appropriate examination or evaluation, counseling and educating the patient, ordering medications, test or procedures, documenting clinical information in the electronic or other health care record, and independently interpreting results and communicating results to the patient, family, or caregiver   I have reviewed the above documentation for accuracy  and completeness, and I agree with the above. [name], D.O.  The 21st Century Cures Act was signed into law in 2016 which includes the topic of electronic health records.  This provides immediate access to information in MyChart.  This includes consultation notes, operative notes, office notes, lab results and pathology reports.  If you have any questions about what you read please let us know at your next visit so we can discuss your concerns and take corrective action if need be.  We are right here with you.

## 2023-05-24 ENCOUNTER — Other Ambulatory Visit: Payer: Self-pay | Admitting: Internal Medicine

## 2023-05-26 ENCOUNTER — Telehealth: Payer: Self-pay | Admitting: Internal Medicine

## 2023-05-26 NOTE — Telephone Encounter (Signed)
Next OV is 06/21/2023.   Prescription Request  05/26/2023  LOV: 04/19/2023  What is the name of the medication or equipment?  HYDROcodone-acetaminophen (NORCO) 7.5-325 MG tablet  amphetamine-dextroamphetamine (ADDERALL) 5 MG tablet  Have you contacted your pharmacy to request a refill? Yes   Which pharmacy would you like this sent to?  [id number] - [place], [place] - [address] AT [facility] [place] [address] [place] [address] Phone: [phone] Fax: [phone]    Patient notified that their request is being sent to the clinical staff for review and that they should receive a response within 2 business days.   Please advise at Mobile [phone] (mobile)

## 2023-05-27 MED ORDER — AMPHETAMINE-DEXTROAMPHETAMINE 5 MG PO TABS: ORAL_TABLET | ORAL | 0 refills | Status: DC

## 2023-05-27 MED ORDER — HYDROCODONE-ACETAMINOPHEN 7.5-325 MG PO TABS: 1.0000 | ORAL_TABLET | Freq: Three times a day (TID) | ORAL | 0 refills | Status: DC | PRN

## 2023-05-27 NOTE — Telephone Encounter (Signed)
Okay.  Thanks.

## 2023-05-30 MED ORDER — AMPHETAMINE-DEXTROAMPHETAMINE 5 MG PO TABS: ORAL_TABLET | ORAL | 0 refills | Status: DC

## 2023-05-30 MED ORDER — HYDROCODONE-ACETAMINOPHEN 7.5-325 MG PO TABS: 1.0000 | ORAL_TABLET | Freq: Three times a day (TID) | ORAL | 0 refills | Status: DC | PRN

## 2023-06-20 ENCOUNTER — Ambulatory Visit (INDEPENDENT_AMBULATORY_CARE_PROVIDER_SITE_OTHER): Payer: Medicaid Other | Admitting: Physician Assistant

## 2023-06-21 ENCOUNTER — Ambulatory Visit: Payer: Medicaid Other | Admitting: Internal Medicine

## 2023-07-07 ENCOUNTER — Ambulatory Visit: Payer: BC Managed Care – PPO | Admitting: Internal Medicine

## 2023-07-07 ENCOUNTER — Encounter: Payer: Self-pay | Admitting: Internal Medicine

## 2023-07-07 VITALS — BP 120/86 | HR 89 | Temp 98.6°F | Ht 63.0 in | Wt 277.0 lb

## 2023-07-07 DIAGNOSIS — E038 Other specified hypothyroidism: Secondary | ICD-10-CM | POA: Diagnosis not present

## 2023-07-07 DIAGNOSIS — G9332 Myalgic encephalomyelitis/chronic fatigue syndrome: Secondary | ICD-10-CM

## 2023-07-07 DIAGNOSIS — R600 Localized edema: Secondary | ICD-10-CM

## 2023-07-07 DIAGNOSIS — E559 Vitamin D deficiency, unspecified: Secondary | ICD-10-CM

## 2023-07-07 DIAGNOSIS — L930 Discoid lupus erythematosus: Secondary | ICD-10-CM

## 2023-07-07 DIAGNOSIS — F419 Anxiety disorder, unspecified: Secondary | ICD-10-CM | POA: Diagnosis not present

## 2023-07-07 MED ORDER — ALPRAZOLAM 0.5 MG PO TABS
ORAL_TABLET | ORAL | 3 refills | Status: DC
Start: 2023-07-07 — End: 2023-10-10

## 2023-07-07 MED ORDER — PANTOPRAZOLE SODIUM 40 MG PO TBEC: 40.0000 mg | DELAYED_RELEASE_TABLET | Freq: Two times a day (BID) | ORAL | 3 refills | Status: DC

## 2023-07-07 MED ORDER — HYDROCODONE-ACETAMINOPHEN 7.5-325 MG PO TABS: 1.0000 | ORAL_TABLET | Freq: Three times a day (TID) | ORAL | 0 refills | Status: DC | PRN

## 2023-07-07 MED ORDER — AMPHETAMINE-DEXTROAMPHETAMINE 5 MG PO TABS: ORAL_TABLET | ORAL | 0 refills | Status: DC

## 2023-07-07 MED ORDER — DIPHENOXYLATE-ATROPINE 2.5-0.025 MG PO TABS: 1.0000 | ORAL_TABLET | Freq: Four times a day (QID) | ORAL | 1 refills | Status: AC | PRN

## 2023-07-07 MED ORDER — BUPROPION HCL ER (SR) 200 MG PO TB12: 200.0000 mg | ORAL_TABLET | Freq: Every day | ORAL | 3 refills | Status: DC

## 2023-07-07 MED ORDER — ALBUTEROL SULFATE HFA 108 (90 BASE) MCG/ACT IN AERS: 2.0000 | INHALATION_SPRAY | RESPIRATORY_TRACT | 11 refills | Status: AC | PRN

## 2023-07-07 MED ORDER — FUROSEMIDE 20 MG PO TABS: 20.0000 mg | ORAL_TABLET | Freq: Every day | ORAL | 3 refills | Status: DC

## 2023-07-07 MED ORDER — LEVOTHYROXINE SODIUM 100 MCG PO TABS: ORAL_TABLET | ORAL | 3 refills | Status: DC

## 2023-07-07 MED ORDER — CALCITRIOL 0.5 MCG PO CAPS: 0.5000 ug | ORAL_CAPSULE | Freq: Two times a day (BID) | ORAL | 11 refills | Status: DC

## 2023-07-07 NOTE — Progress Notes (Signed)
 Subjective:  Patient ID: Tiffany Medina, female    DOB: 19-May-1966  Age: 58 y.o. MRN: [id number]  CC: No chief complaint on file.   HPI [name] presents for anxiety, fatigue, LBP  Outpatient Medications Prior to Visit  Medication Sig Dispense Refill   ASPIRIN  81 PO Take 2 tablets by mouth daily.     cyanocobalamin  (VITAMIN B12) 1000 MCG/ML injection Inject 1 mL (1,000 mcg total) into the muscle every 14 (fourteen) days. 6 mL 3   cyclobenzaprine  (FLEXERIL ) 10 MG tablet Take 1 tablet (10 mg total) by mouth 3 (three) times daily as needed. 90 tablet 1   estradiol (VIVELLE-DOT) 0.1 MG/24HR patch APPLY 1 PATCH TRANSDERMALLY TWICE A WEEK     ibuprofen  (ADVIL ) 800 MG tablet Take 1 tablet (800 mg total) by mouth every 8 (eight) hours as needed. 90 tablet 2   ondansetron  (ZOFRAN ) 4 MG tablet Take 1 tablet (4 mg total) by mouth every 8 (eight) hours as needed for nausea or vomiting. 20 tablet 1   Semaglutide -Weight Management (WEGOVY ) 0.5 MG/0.5ML SOAJ Inject 0.5 mg into the skin once a week. 2 mL 0   albuterol  (PROVENTIL  HFA) 108 (90 Base) MCG/ACT inhaler Inhale 2 puffs into the lungs every 4 (four) hours as needed for wheezing or shortness of breath. 8.5 g 11   ALPRAZolam  (XANAX ) 0.5 MG tablet TAKE 3 TABLETS BY MOUTH EVERY DAY AT BEDTIME AS NEEDED 90 tablet 3   amphetamine -dextroamphetamine  (ADDERALL) 5 MG tablet 5 mg in am, 5 mg at lunch 60 tablet 0   buPROPion  (WELLBUTRIN  SR) 200 MG 12 hr tablet Take 1 tablet (200 mg total) by mouth daily. 90 tablet 3   calcitRIOL  (ROCALTROL ) 0.5 MCG capsule Take 1 capsule (0.5 mcg total) by mouth in the morning and at bedtime. 60 capsule 11   diphenoxylate -atropine  (LOMOTIL ) 2.5-0.025 MG tablet Take 1 tablet by mouth 4 (four) times daily as needed for diarrhea or loose stools. 60 tablet 1   furosemide  (LASIX ) 20 MG tablet Take 1 tablet (20 mg total) by mouth daily. 30 tablet 3   HYDROcodone -acetaminophen  (NORCO) 7.5-325 MG tablet Take 1 tablet by mouth  every 8 (eight) hours as needed for severe pain (pain score 7-10). 90 tablet 0   HYDROcodone -acetaminophen  (NORCO) 7.5-325 MG tablet Take 1 tablet by mouth every 8 (eight) hours as needed for severe pain (pain score 7-10). 90 tablet 0   levothyroxine  (SYNTHROID ) 100 MCG tablet TAKE 1/2 TABLET BY MOUTH ONCE DAILY BEFORE BREAKFAST (50mcg) 90 tablet 3   pantoprazole  (PROTONIX ) 40 MG tablet Take 1 tablet (40 mg total) by mouth 2 (two) times daily. 180 tablet 1   amphetamine -dextroamphetamine  (ADDERALL) 5 MG tablet 5 mg in am, 5 mg at lunch (Patient not taking: Reported on 07/07/2023) 60 tablet 0   No facility-administered medications prior to visit.    ROS: Review of Systems  Constitutional:  Negative for activity change, appetite change, chills, fatigue and unexpected weight change.  HENT:  Positive for congestion. Negative for mouth sores and sinus pressure.   Eyes:  Negative for visual disturbance.  Respiratory:  Negative for cough and chest tightness.   Gastrointestinal:  Negative for abdominal pain and nausea.  Genitourinary:  Negative for difficulty urinating, frequency and vaginal pain.  Musculoskeletal:  Positive for arthralgias and back pain. Negative for gait problem.  Skin:  Negative for pallor and rash.  Neurological:  Negative for dizziness, tremors, weakness, numbness and headaches.  Psychiatric/Behavioral:  Positive for decreased concentration. Negative for  confusion, sleep disturbance and suicidal ideas.     Objective:  BP 120/86 (BP Location: Left Arm, Patient Position: Sitting, Cuff Size: Normal)   Pulse 89   Temp 98.6 F (37 C) (Oral)   Ht 5' 3 (1.6 m)   Wt 277 lb (125.6 kg)   SpO2 99%   BMI 49.07 kg/m   BP Readings from Last 3 Encounters:  07/07/23 120/86  05/23/23 138/85  04/19/23 130/80    Wt Readings from Last 3 Encounters:  07/07/23 277 lb (125.6 kg)  05/23/23 268 lb (121.6 kg)  04/19/23 273 lb (123.8 kg)    Physical Exam Constitutional:       General: She is not in acute distress.    Appearance: She is well-developed. She is obese.  HENT:     Head: Normocephalic.     Right Ear: External ear normal.     Left Ear: External ear normal.     Nose: Nose normal.  Eyes:     General:        Right eye: No discharge.        Left eye: No discharge.     Conjunctiva/sclera: Conjunctivae normal.     Pupils: Pupils are equal, round, and reactive to light.  Neck:     Thyroid : No thyromegaly.     Vascular: No JVD.     Trachea: No tracheal deviation.  Cardiovascular:     Rate and Rhythm: Normal rate and regular rhythm.     Heart sounds: Normal heart sounds.  Pulmonary:     Effort: No respiratory distress.     Breath sounds: No stridor. No wheezing.  Abdominal:     General: Bowel sounds are normal. There is no distension.     Palpations: Abdomen is soft. There is no mass.     Tenderness: There is no abdominal tenderness. There is no guarding or rebound.  Musculoskeletal:        General: Tenderness present.     Cervical back: Normal range of motion and neck supple. No rigidity.     Right lower leg: Edema present.     Left lower leg: Edema present.  Lymphadenopathy:     Cervical: No cervical adenopathy.  Skin:    Findings: No erythema or rash.  Neurological:     Mental Status: She is oriented to person, place, and time.     Cranial Nerves: No cranial nerve deficit.     Motor: No abnormal muscle tone.     Coordination: Coordination normal.     Gait: Gait abnormal.     Deep Tendon Reflexes: Reflexes normal.  Psychiatric:        Behavior: Behavior normal.        Thought Content: Thought content normal.        Judgment: Judgment normal.    LS w/pain Antalgic gait Lab Results  Component Value Date   WBC 8.5 04/19/2023   HGB 15.9 (H) 04/19/2023   HCT 48.1 (H) 04/19/2023   PLT 381.0 04/19/2023   GLUCOSE 100 (H) 04/19/2023   CHOL 186 11/24/2022   TRIG 74 11/24/2022   HDL 68 11/24/2022   LDLCALC 104 (H) 11/24/2022   ALT 18  04/19/2023   AST 16 04/19/2023   NA 140 04/19/2023   K 4.5 04/19/2023   CL 99 04/19/2023   CREATININE 1.08 04/19/2023   BUN 20 04/19/2023   CO2 32 04/19/2023   TSH 1.47 09/29/2022   INR 1.02 11/28/2014   HGBA1C 6.3 04/19/2023  DG INJECT DIAG/THERA/INC NEEDLE/CATH/PLC EPI/LUMB/SAC W/IMG Result Date: 01/25/2023 CLINICAL DATA:  Low back pain radiating to the left leg all the way to the foot. FLUOROSCOPY: Radiation Exposure Index (as provided by the fluoroscopic device): 0 minutes 42 seconds. 70.58 micro gray meter squared PROCEDURE: The procedure, risks, benefits, and alternatives were explained to the patient. Questions regarding the procedure were encouraged and answered. The patient understands and consents to the procedure. LUMBAR EPIDURAL INJECTION: An interlaminar approach was performed on the left at L5-S1. The overlying skin was cleansed and anesthetized. A 20 gauge epidural needle was advanced using loss-of-resistance technique. DIAGNOSTIC EPIDURAL INJECTION: Injection of Isovue -M 200 shows a good epidural pattern with spread above and below the level of needle placement, primarily on the left no vascular opacification is seen. THERAPEUTIC EPIDURAL INJECTION: Eighty mg of Depo-Medrol  mixed with 2 cc 1% lidocaine were instilled. The procedure was well-tolerated, and the patient was discharged thirty minutes following the injection in good condition. COMPLICATIONS: None IMPRESSION: Technically successful L5-S1 left epidural steroid injection. Electronically Signed   By: Oneil Officer M.D.   On: 01/25/2023 15:02    Assessment & Plan:   Problem List Items Addressed This Visit     CFS (chronic fatigue syndrome)   Hypothyroidism - Primary   Relevant Medications   levothyroxine  (SYNTHROID ) 100 MCG tablet   Other Relevant Orders   CBC with Differential/Platelet   Lipid panel   Discoid lupus   Vitamin D  deficiency   Edema   Relevant Orders   Comprehensive metabolic panel   Anxiety  disorder   Relevant Medications   ALPRAZolam  (XANAX ) 0.5 MG tablet   buPROPion  (WELLBUTRIN  SR) 200 MG 12 hr tablet   Other Relevant Orders   Comprehensive metabolic panel   TSH      Meds ordered this encounter  Medications   albuterol  (PROVENTIL  HFA) 108 (90 Base) MCG/ACT inhaler    Sig: Inhale 2 puffs into the lungs every 4 (four) hours as needed for wheezing or shortness of breath.    Dispense:  8.5 g    Refill:  11   ALPRAZolam  (XANAX ) 0.5 MG tablet    Sig: TAKE 3 TABLETS BY MOUTH EVERY DAY AT BEDTIME AS NEEDED    Dispense:  90 tablet    Refill:  3   buPROPion  (WELLBUTRIN  SR) 200 MG 12 hr tablet    Sig: Take 1 tablet (200 mg total) by mouth daily.    Dispense:  90 tablet    Refill:  3   calcitRIOL  (ROCALTROL ) 0.5 MCG capsule    Sig: Take 1 capsule (0.5 mcg total) by mouth in the morning and at bedtime.    Dispense:  60 capsule    Refill:  11   diphenoxylate -atropine  (LOMOTIL ) 2.5-0.025 MG tablet    Sig: Take 1 tablet by mouth 4 (four) times daily as needed for diarrhea or loose stools.    Dispense:  60 tablet    Refill:  1   furosemide  (LASIX ) 20 MG tablet    Sig: Take 1 tablet (20 mg total) by mouth daily.    Dispense:  90 tablet    Refill:  3   levothyroxine  (SYNTHROID ) 100 MCG tablet    Sig: TAKE 1/2 TABLET BY MOUTH ONCE DAILY BEFORE BREAKFAST (50mcg)    Dispense:  90 tablet    Refill:  3    Keep follow-up appt for future refills   pantoprazole  (PROTONIX ) 40 MG tablet    Sig: Take 1 tablet (40 mg total)  by mouth 2 (two) times daily.    Dispense:  180 tablet    Refill:  3   amphetamine -dextroamphetamine  (ADDERALL) 5 MG tablet    Sig: 5 mg in am, 5 mg at lunch    Dispense:  60 tablet    Refill:  0    Please fill on or after 07/07/23   amphetamine -dextroamphetamine  (ADDERALL) 5 MG tablet    Sig: 5 mg in am, 5 mg at lunch    Dispense:  60 tablet    Refill:  0    Please fill on or after 08/06/23   HYDROcodone -acetaminophen  (NORCO) 7.5-325 MG tablet    Sig: Take 1  tablet by mouth every 8 (eight) hours as needed for severe pain (pain score 7-10).    Dispense:  90 tablet    Refill:  0    Please fill on or after 07/07/23   HYDROcodone -acetaminophen  (NORCO) 7.5-325 MG tablet    Sig: Take 1 tablet by mouth every 8 (eight) hours as needed for severe pain (pain score 7-10).    Dispense:  90 tablet    Refill:  0    Please fill on or after 08/06/23      Follow-up: Return in about 2 months (around 09/04/2023) for a follow-up visit.  Marolyn Noel, MD

## 2023-07-08 NOTE — Progress Notes (Signed)
 error

## 2023-07-11 ENCOUNTER — Telehealth: Payer: Self-pay | Admitting: Pharmacy Technician

## 2023-07-11 ENCOUNTER — Other Ambulatory Visit (HOSPITAL_COMMUNITY): Payer: Self-pay

## 2023-07-11 ENCOUNTER — Encounter: Payer: BC Managed Care – PPO | Admitting: Sports Medicine

## 2023-07-11 NOTE — Telephone Encounter (Signed)
 Pharmacy Patient Advocate Encounter   Received notification from CoverMyMeds that prior authorization for HYDROcodone -Acetaminophen  7.5-325MG  tablets is required/requested.   Insurance verification completed.   The patient is insured through [facility] .   Per test claim: PA required; PA submitted to above mentioned insurance via CoverMyMeds Key/confirmation #/EOC Key: [id number] Status is pending

## 2023-07-11 NOTE — Telephone Encounter (Signed)
 Pharmacy Patient Advocate Encounter  Received notification from [facility] that Prior Authorization for HYDROcodone -Acetaminophen  7.5-325MG  tablets has been APPROVED from 07/11/2023 to 08/10/2023. Ran test claim, Copay is $10.50. This test claim was processed through [facility]- copay amounts may vary at other pharmacies due to pharmacy/plan contracts, or as the patient moves through the different stages of their insurance plan.   PA #/Case ID/Reference #: Key: [id number]

## 2023-07-12 ENCOUNTER — Ambulatory Visit (INDEPENDENT_AMBULATORY_CARE_PROVIDER_SITE_OTHER): Payer: BC Managed Care – PPO | Admitting: Family Medicine

## 2023-07-12 ENCOUNTER — Other Ambulatory Visit: Payer: Self-pay

## 2023-07-12 VITALS — BP 120/86 | HR 89 | Ht 63.0 in | Wt 280.0 lb

## 2023-07-12 DIAGNOSIS — M25561 Pain in right knee: Secondary | ICD-10-CM | POA: Diagnosis not present

## 2023-07-12 DIAGNOSIS — G8929 Other chronic pain: Secondary | ICD-10-CM

## 2023-07-12 DIAGNOSIS — M25562 Pain in left knee: Secondary | ICD-10-CM | POA: Diagnosis not present

## 2023-07-12 DIAGNOSIS — M170 Bilateral primary osteoarthritis of knee: Secondary | ICD-10-CM

## 2023-07-12 NOTE — Progress Notes (Signed)
 This encounter was created in error - please disregard.

## 2023-07-12 NOTE — Patient Instructions (Addendum)
Thank you for coming in today.   You received an injection today. Seek immediate medical attention if the joint becomes red, extremely painful, or is oozing fluid.   Return as needed.

## 2023-07-12 NOTE — Progress Notes (Signed)
 LILLETTE Ileana Collet, PhD, LAT, ATC acting as a scribe for Artist Lloyd, MD.  Tiffany Medina is a 58 y.o. female who presents to [facility] at [facility] today for cont'd bilat knee pain. Pt was last seen by Dr. Lloyd on 03/10/23 and was given bilat knee steroid injections.   Today, pt reports knee pain returned last Wed-Thursday. PCP cont'd the lasix . She is in a lot of pain. She also saw neurosurgery, who advised surgery.   Dx testing: 03/05/23 L-spine MRI  10/15/22 R knee XR             12/02/16 L LE vasc US              11/28/14 L LE vasc US   Pertinent review of systems: No fevers or chills  Relevant historical information: DVT history   Exam:  BP 120/86   Pulse 89   Ht 5' 3 (1.6 m)   Wt 280 lb (127 kg)   SpO2 99%   BMI 49.60 kg/m  General: Well Developed, well nourished, and in no acute distress.   MSK: Bilateral knees mild effusion right worse than left.  Otherwise normal-appearing normal motion with crepitation.    Lab and Radiology Results  Procedure: Real-time Ultrasound Guided Injection of right knee joint superior lateral patella space Device: Philips Affiniti 50G/GE Logiq Images permanently stored and available for review in PACS Verbal informed consent obtained.  Discussed risks and benefits of procedure. Warned about infection, bleeding, hyperglycemia damage to structures among others. Patient expresses understanding and agreement Time-out conducted.   Noted no overlying erythema, induration, or other signs of local infection.   Skin prepped in a sterile fashion.   Local anesthesia: Topical Ethyl chloride.   With sterile technique and under real time ultrasound guidance: 40 mg of Kenalog  and 2 mL of Marcaine injected into knee joint. Fluid seen entering the joint capsule.   Completed without difficulty   Pain immediately resolved suggesting accurate placement of the medication.   Advised to call if fevers/chills, erythema, induration, drainage,  or persistent bleeding.   Images permanently stored and available for review in the ultrasound unit.  Impression: Technically successful ultrasound guided injection.   Procedure: Real-time Ultrasound Guided Injection of left knee joint superior lateral patella space Device: Philips Affiniti 50G/GE Logiq Images permanently stored and available for review in PACS Verbal informed consent obtained.  Discussed risks and benefits of procedure. Warned about infection, bleeding, hyperglycemia damage to structures among others. Patient expresses understanding and agreement Time-out conducted.   Noted no overlying erythema, induration, or other signs of local infection.   Skin prepped in a sterile fashion.   Local anesthesia: Topical Ethyl chloride.   With sterile technique and under real time ultrasound guidance: 40 mg of Kenalog  and 2 mL of Marcaine injected into knee joint. Fluid seen entering the joint capsule.   Completed without difficulty   Pain immediately resolved suggesting accurate placement of the medication.   Advised to call if fevers/chills, erythema, induration, drainage, or persistent bleeding.   Images permanently stored and available for review in the ultrasound unit.  Impression: Technically successful ultrasound guided injection.        Assessment and Plan: 58 y.o. female with bilateral knee pain due to exacerbation of DJD.  Plan for steroid injection bilateral knees today.  Previous injection was about 4 months ago.  Recheck back as needed.  Continue to work on quad strength and weight loss.   PDMP not reviewed this encounter.  Orders Placed This Encounter  Procedures   US  LIMITED JOINT SPACE STRUCTURES LOW BILAT(NO LINKED CHARGES)    Reason for Exam (SYMPTOM  OR DIAGNOSIS REQUIRED):   bilateral knee pain    Preferred imaging location?:   [facility]   No orders of the defined types were placed in this encounter.    Discussed warning signs  or symptoms. Please see discharge instructions. Patient expresses understanding.   The above documentation has been reviewed and is accurate and complete Artist Lloyd, M.D.

## 2023-07-12 NOTE — Telephone Encounter (Signed)
 Noted! Thank you

## 2023-07-13 ENCOUNTER — Ambulatory Visit: Payer: BC Managed Care – PPO | Admitting: Sports Medicine

## 2023-07-19 ENCOUNTER — Ambulatory Visit (INDEPENDENT_AMBULATORY_CARE_PROVIDER_SITE_OTHER): Payer: Medicaid Other | Admitting: Family Medicine

## 2023-08-02 ENCOUNTER — Other Ambulatory Visit: Payer: Self-pay | Admitting: Internal Medicine

## 2023-08-03 ENCOUNTER — Other Ambulatory Visit (HOSPITAL_COMMUNITY): Payer: Self-pay

## 2023-08-03 ENCOUNTER — Telehealth: Payer: Self-pay

## 2023-08-03 NOTE — Telephone Encounter (Signed)
Pharmacy Patient Advocate Encounter   Received notification from CoverMyMeds that prior authorization for [facility] 0.5MG /0.5ML auto-injectors is required/requested.   Insurance verification completed.   The patient is insured through [facility] .   Per test claim: PA required; PA submitted to above mentioned insurance via CoverMyMeds Key/confirmation #/EOC [id number] Status is pending

## 2023-08-03 NOTE — Telephone Encounter (Signed)
Pharmacy Patient Advocate Encounter  Received notification from [facility] that Prior Authorization for [facility] 0.5MG /0.5ML auto-injectors has been DENIED.  Full denial letter will be uploaded to the media tab. See denial reason below.   PA #/Case ID/Reference #: [id number]

## 2023-08-05 NOTE — Telephone Encounter (Signed)
 Noted. Thanks.

## 2023-08-09 ENCOUNTER — Other Ambulatory Visit (HOSPITAL_COMMUNITY): Payer: Self-pay

## 2023-08-09 MED ORDER — HYDROCODONE-ACETAMINOPHEN 7.5-325 MG PO TABS: 1.0000 | ORAL_TABLET | Freq: Three times a day (TID) | ORAL | 0 refills | Status: DC | PRN

## 2023-08-11 ENCOUNTER — Other Ambulatory Visit (HOSPITAL_COMMUNITY): Payer: Self-pay

## 2023-09-05 ENCOUNTER — Ambulatory Visit: Payer: BC Managed Care – PPO | Admitting: Internal Medicine

## 2023-09-06 ENCOUNTER — Ambulatory Visit: Payer: BC Managed Care – PPO | Admitting: Internal Medicine

## 2023-09-06 ENCOUNTER — Encounter: Payer: Self-pay | Admitting: Internal Medicine

## 2023-09-06 VITALS — BP 122/82 | HR 90 | Temp 98.5°F | Ht 63.0 in | Wt 272.0 lb

## 2023-09-06 DIAGNOSIS — G8929 Other chronic pain: Secondary | ICD-10-CM | POA: Diagnosis not present

## 2023-09-06 DIAGNOSIS — R600 Localized edema: Secondary | ICD-10-CM | POA: Diagnosis not present

## 2023-09-06 DIAGNOSIS — Z6841 Body Mass Index (BMI) 40.0 and over, adult: Secondary | ICD-10-CM

## 2023-09-06 DIAGNOSIS — E66813 Obesity, class 3: Secondary | ICD-10-CM

## 2023-09-06 DIAGNOSIS — G9332 Myalgic encephalomyelitis/chronic fatigue syndrome: Secondary | ICD-10-CM | POA: Diagnosis not present

## 2023-09-06 DIAGNOSIS — M5440 Lumbago with sciatica, unspecified side: Secondary | ICD-10-CM

## 2023-09-06 DIAGNOSIS — E038 Other specified hypothyroidism: Secondary | ICD-10-CM | POA: Diagnosis not present

## 2023-09-06 DIAGNOSIS — F419 Anxiety disorder, unspecified: Secondary | ICD-10-CM | POA: Diagnosis not present

## 2023-09-06 DIAGNOSIS — G2581 Restless legs syndrome: Secondary | ICD-10-CM

## 2023-09-06 DIAGNOSIS — E538 Deficiency of other specified B group vitamins: Secondary | ICD-10-CM

## 2023-09-06 DIAGNOSIS — F909 Attention-deficit hyperactivity disorder, unspecified type: Secondary | ICD-10-CM

## 2023-09-06 LAB — CBC WITH DIFFERENTIAL/PLATELET
Basophils Absolute: 0 10*3/uL (ref 0.0–0.1)
Basophils Relative: 0.4 % (ref 0.0–3.0)
Eosinophils Absolute: 0.1 10*3/uL (ref 0.0–0.7)
Eosinophils Relative: 1 % (ref 0.0–5.0)
HCT: 47.8 % — ABNORMAL HIGH (ref 36.0–46.0)
Hemoglobin: 15.7 g/dL — ABNORMAL HIGH (ref 12.0–15.0)
Lymphocytes Relative: 19.8 % (ref 12.0–46.0)
Lymphs Abs: 1.6 10*3/uL (ref 0.7–4.0)
MCHC: 33 g/dL (ref 30.0–36.0)
MCV: 96.5 fl (ref 78.0–100.0)
Monocytes Absolute: 0.8 10*3/uL (ref 0.1–1.0)
Monocytes Relative: 9.7 % (ref 3.0–12.0)
Neutro Abs: 5.4 10*3/uL (ref 1.4–7.7)
Neutrophils Relative %: 69.1 % (ref 43.0–77.0)
Platelets: 411 10*3/uL — ABNORMAL HIGH (ref 150.0–400.0)
RBC: 4.95 Mil/uL (ref 3.87–5.11)
RDW: 14 % (ref 11.5–15.5)
WBC: 7.9 10*3/uL (ref 4.0–10.5)

## 2023-09-06 LAB — LIPID PANEL
Cholesterol: 154 mg/dL (ref 0–200)
HDL: 52.8 mg/dL (ref >39.00)
LDL Cholesterol: 79 mg/dL (ref 0–99)
NonHDL: 100.74
Total CHOL/HDL Ratio: 3
Triglycerides: 107 mg/dL (ref 0.0–149.0)
VLDL: 21.4 mg/dL (ref 0.0–40.0)

## 2023-09-06 LAB — COMPREHENSIVE METABOLIC PANEL
ALT: 23 U/L (ref 0–35)
AST: 25 U/L (ref 0–37)
Albumin: 4.6 g/dL (ref 3.5–5.2)
Alkaline Phosphatase: 78 U/L (ref 39–117)
BUN: 16 mg/dL (ref 6–23)
CO2: 31 meq/L (ref 19–32)
Calcium: 9.9 mg/dL (ref 8.4–10.5)
Chloride: 98 meq/L (ref 96–112)
Creatinine, Ser: 1.09 mg/dL (ref 0.40–1.20)
GFR: 55.86 mL/min — ABNORMAL LOW (ref >60.00)
Glucose, Bld: 101 mg/dL — ABNORMAL HIGH (ref 70–99)
Potassium: 4 meq/L (ref 3.5–5.1)
Sodium: 140 meq/L (ref 135–145)
Total Bilirubin: 0.4 mg/dL (ref 0.2–1.2)
Total Protein: 7.9 g/dL (ref 6.0–8.3)

## 2023-09-06 LAB — TSH: TSH: 0.81 u[IU]/mL (ref 0.35–5.50)

## 2023-09-06 MED ORDER — AMPHETAMINE-DEXTROAMPHETAMINE 5 MG PO TABS: ORAL_TABLET | ORAL | 0 refills | Status: DC

## 2023-09-06 MED ORDER — HYDROCODONE-ACETAMINOPHEN 7.5-325 MG PO TABS: 1.0000 | ORAL_TABLET | Freq: Three times a day (TID) | ORAL | 0 refills | Status: DC | PRN

## 2023-09-06 MED ORDER — IBUPROFEN 800 MG PO TABS: 800.0000 mg | ORAL_TABLET | Freq: Three times a day (TID) | ORAL | 2 refills | Status: DC | PRN

## 2023-09-06 NOTE — Assessment & Plan Note (Signed)
 On B12

## 2023-09-06 NOTE — Assessment & Plan Note (Signed)
Chronic On Tramadol, Requip, Gabapentin

## 2023-09-06 NOTE — Progress Notes (Signed)
 Subjective:  Patient ID: Tiffany Medina, female    DOB: 1965-11-29  Age: 58 y.o. MRN: [id number]  CC: Medical Management of Chronic Issues (2 mnth f/u, Discuss rx's for medical equipment tht insurance will cover )   HPI Tiffany Medina presents for obesity - on diet F/u on anxiety, chronic pain  Outpatient Medications Prior to Visit  Medication Sig Dispense Refill   albuterol (PROVENTIL HFA) 108 (90 Base) MCG/ACT inhaler Inhale 2 puffs into the lungs every 4 (four) hours as needed for wheezing or shortness of breath. 8.5 g 11   ALPRAZolam (XANAX) 0.5 MG tablet TAKE 3 TABLETS BY MOUTH EVERY DAY AT BEDTIME AS NEEDED 90 tablet 3   amphetamine-dextroamphetamine (ADDERALL) 5 MG tablet 5 mg in am, 5 mg at lunch 60 tablet 0   ASPIRIN 81 PO Take 2 tablets by mouth daily.     buPROPion (WELLBUTRIN SR) 200 MG 12 hr tablet Take 1 tablet (200 mg total) by mouth daily. 90 tablet 3   calcitRIOL (ROCALTROL) 0.5 MCG capsule Take 1 capsule (0.5 mcg total) by mouth in the morning and at bedtime. 60 capsule 11   cyanocobalamin (VITAMIN B12) 1000 MCG/ML injection Inject 1 mL (1,000 mcg total) into the muscle every 14 (fourteen) days. 6 mL 3   cyclobenzaprine (FLEXERIL) 10 MG tablet Take 1 tablet (10 mg total) by mouth 3 (three) times daily as needed. 90 tablet 1   diphenoxylate-atropine (LOMOTIL) 2.5-0.025 MG tablet Take 1 tablet by mouth 4 (four) times daily as needed for diarrhea or loose stools. 60 tablet 1   estradiol (VIVELLE-DOT) 0.1 MG/24HR patch APPLY 1 PATCH TRANSDERMALLY TWICE A WEEK     furosemide (LASIX) 20 MG tablet Take 1 tablet (20 mg total) by mouth daily. 90 tablet 3   HYDROcodone-acetaminophen (NORCO) 7.5-325 MG tablet Take 1 tablet by mouth every 8 (eight) hours as needed for severe pain (pain score 7-10). 90 tablet 0   levothyroxine (SYNTHROID) 100 MCG tablet TAKE 1/2 TABLET BY MOUTH ONCE DAILY BEFORE BREAKFAST ( ) 90 tablet 3   ondansetron (ZOFRAN) 4 MG tablet Take 1 tablet (4 mg  total) by mouth every 8 (eight) hours as needed for nausea or vomiting. 20 tablet 1   pantoprazole (PROTONIX) 40 MG tablet Take 1 tablet (40 mg total) by mouth 2 (two) times daily. 180 tablet 3   Semaglutide-Weight Management (WEGOVY) 0.5 MG/0.5ML SOAJ Inject 0.5 mg into the skin once a week. 2 mL 0   amphetamine-dextroamphetamine (ADDERALL) 5 MG tablet 5 mg in am, 5 mg at lunch 60 tablet 0   HYDROcodone-acetaminophen (NORCO) 7.5-325 MG tablet Take 1 tablet by mouth every 8 (eight) hours as needed for severe pain (pain score 7-10). 90 tablet 0   ibuprofen (ADVIL) 800 MG tablet Take 1 tablet (800 mg total) by mouth every 8 (eight) hours as needed. 90 tablet 2   No facility-administered medications prior to visit.    ROS: Review of Systems  Constitutional:  Negative for activity change, appetite change, chills, fatigue and unexpected weight change.  HENT:  Positive for congestion. Negative for mouth sores and sinus pressure.   Eyes:  Negative for visual disturbance.  Respiratory:  Negative for cough and chest tightness.   Gastrointestinal:  Negative for abdominal pain and nausea.  Genitourinary:  Negative for difficulty urinating, frequency and vaginal pain.  Musculoskeletal:  Positive for arthralgias, back pain, gait problem, joint swelling, neck pain and neck stiffness.  Skin:  Negative for pallor and rash.  Neurological:  Negative for dizziness, tremors, speech difficulty, weakness, numbness and headaches.  Hematological:  Bruises/bleeds easily.  Psychiatric/Behavioral:  Positive for dysphoric mood. Negative for confusion, sleep disturbance and suicidal ideas. The patient is nervous/anxious.     Objective:  BP 122/82   Pulse 90   Temp 98.5 F (36.9 C) (Oral)   Ht 5\' 3"  (1.6 m)   Wt 272 lb (123.4 kg)   SpO2 99%   BMI 48.18 kg/m   BP Readings from Last 3 Encounters:  09/06/23 122/82  07/12/23 120/86  07/07/23 120/86    Wt Readings from Last 3 Encounters:  09/06/23 272 lb  (123.4 kg)  07/12/23 280 lb (127 kg)  07/07/23 277 lb (125.6 kg)    Physical Exam Constitutional:      General: She is not in acute distress.    Appearance: She is well-developed. She is obese.  HENT:     Head: Normocephalic.     Right Ear: External ear normal.     Left Ear: External ear normal.     Nose: Nose normal.  Eyes:     General:        Right eye: No discharge.        Left eye: No discharge.     Conjunctiva/sclera: Conjunctivae normal.     Pupils: Pupils are equal, round, and reactive to light.  Neck:     Thyroid: No thyromegaly.     Vascular: No JVD.     Trachea: No tracheal deviation.  Cardiovascular:     Rate and Rhythm: Normal rate and regular rhythm.     Heart sounds: Normal heart sounds.  Pulmonary:     Effort: No respiratory distress.     Breath sounds: No stridor. No wheezing.  Abdominal:     General: Bowel sounds are normal. There is no distension.     Palpations: Abdomen is soft. There is no mass.     Tenderness: There is no abdominal tenderness. There is no guarding or rebound.  Musculoskeletal:        General: Tenderness present.     Cervical back: Normal range of motion and neck supple. No rigidity.     Right lower leg: No edema.     Left lower leg: No edema.  Lymphadenopathy:     Cervical: No cervical adenopathy.  Skin:    Findings: No erythema or rash.  Neurological:     Cranial Nerves: No cranial nerve deficit.     Motor: No abnormal muscle tone.     Coordination: Coordination normal.     Gait: Gait abnormal.     Deep Tendon Reflexes: Reflexes normal.  Psychiatric:        Behavior: Behavior normal.        Thought Content: Thought content normal.        Judgment: Judgment normal.   Antalgic gait  Lab Results  Component Value Date   WBC 8.5 04/19/2023   HGB 15.9 (H) 04/19/2023   HCT 48.1 (H) 04/19/2023   PLT 381.0 04/19/2023   GLUCOSE 100 (H) 04/19/2023   CHOL 186 11/24/2022   TRIG 74 11/24/2022   HDL 68 11/24/2022   LDLCALC 104  (H) 11/24/2022   ALT 18 04/19/2023   AST 16 04/19/2023   NA 140 04/19/2023   K 4.5 04/19/2023   CL 99 04/19/2023   CREATININE 1.08 04/19/2023   BUN 20 04/19/2023   CO2 32 04/19/2023   TSH 1.47 09/29/2022   INR 1.02 11/28/2014   HGBA1C 6.3  04/19/2023    DG INJECT DIAG/THERA/INC NEEDLE/CATH/PLC EPI/LUMB/SAC W/IMG Result Date: 01/25/2023 CLINICAL DATA:  Low back pain radiating to the left leg all the way to the foot. FLUOROSCOPY: Radiation Exposure Index (as provided by the fluoroscopic device): 0 minutes 42 seconds. 70.58 micro gray meter squared PROCEDURE: The procedure, risks, benefits, and alternatives were explained to the patient. Questions regarding the procedure were encouraged and answered. The patient understands and consents to the procedure. LUMBAR EPIDURAL INJECTION: An interlaminar approach was performed on the left at L5-S1. The overlying skin was cleansed and anesthetized. A 20 gauge epidural needle was advanced using loss-of-resistance technique. DIAGNOSTIC EPIDURAL INJECTION: Injection of Isovue-M 200 shows a good epidural pattern with spread above and below the level of needle placement, primarily on the left no vascular opacification is seen. THERAPEUTIC EPIDURAL INJECTION: Eighty mg of Depo-Medrol mixed with 2 cc 1% lidocaine were instilled. The procedure was well-tolerated, and the patient was discharged thirty minutes following the injection in good condition. COMPLICATIONS: None IMPRESSION: Technically successful L5-S1 left epidural steroid injection. Electronically Signed   By: Paulina Fusi M.D.   On: 01/25/2023 15:02    Assessment & Plan:   Problem List Items Addressed This Visit     Restless leg syndrome - Primary   Chronic On Tramadol, Requip, Gabapentin      CFS (chronic fatigue syndrome)   Stress reduction       ADD (attention deficit disorder)   Provigil, nuvigil - not covered; adderall is covered Will Rx Adderall - monitor HR  Potential benefits of a  long term amphetamines  use as well as potential risks  and complications were explained to the patient and were aknowledged.      Low back pain   UDS - THC ?due to CBD cream use: quit. Repeat test was (-). Re-start Norco; monitor UDS Positive THC on the drug screen test came from CBD cream per patient On Norco to 7.5/325 mg  Potential benefits of a long term opioids use as well as potential risks (i.e. addiction risk, apnea etc) and complications (i.e. Somnolence, constipation and others) were explained to the patient and were aknowledged.      Relevant Medications   HYDROcodone-acetaminophen (NORCO) 7.5-325 MG tablet   ibuprofen (ADVIL) 800 MG tablet   Class 3 severe obesity with serious comorbidity and body mass index (BMI) of 45.0 to 49.9 in adult [facility])   On diet      Relevant Medications   amphetamine-dextroamphetamine (ADDERALL) 5 MG tablet   B12 deficiency   On B12         Meds ordered this encounter  Medications   amphetamine-dextroamphetamine (ADDERALL) 5 MG tablet    Sig: 5 mg in am, 5 mg at lunch    Dispense:  60 tablet    Refill:  0    Please fill on or after 09/06/23   HYDROcodone-acetaminophen (NORCO) 7.5-325 MG tablet    Sig: Take 1 tablet by mouth every 8 (eight) hours as needed for severe pain (pain score 7-10).    Dispense:  90 tablet    Refill:  0    Please fill on or after 09/06/23   ibuprofen (ADVIL) 800 MG tablet    Sig: Take 1 tablet (800 mg total) by mouth every 8 (eight) hours as needed.    Dispense:  90 tablet    Refill:  2      Follow-up: No follow-ups on file.  Sonda Primes, MD

## 2023-09-06 NOTE — Assessment & Plan Note (Signed)
Provigil, nuvigil - not covered; adderall is covered Will Rx Adderall - monitor HR  Potential benefits of a long term amphetamines  use as well as potential risks  and complications were explained to the patient and were aknowledged.

## 2023-09-06 NOTE — Addendum Note (Signed)
 Addended by: Tresa Garter on: 09/06/2023 12:12 PM   Modules accepted: Orders

## 2023-09-06 NOTE — Assessment & Plan Note (Signed)
 Stress reduction

## 2023-09-06 NOTE — Assessment & Plan Note (Signed)
  On diet  

## 2023-09-06 NOTE — Assessment & Plan Note (Signed)
UDS - THC ?due to CBD cream use: quit. Repeat test was (-). Re-start Norco; monitor UDS Positive THC on the drug screen test came from CBD cream per patient On Norco to 7.5/325 mg  Potential benefits of a long term opioids use as well as potential risks (i.e. addiction risk, apnea etc) and complications (i.e. Somnolence, constipation and others) were explained to the patient and were aknowledged.

## 2023-09-07 LAB — PMP SCREEN PROFILE (10S), URINE
Amphetamine Scrn, Ur: NEGATIVE ng/mL
BARBITURATE SCREEN URINE: NEGATIVE ng/mL
BENZODIAZEPINE SCREEN, URINE: POSITIVE ng/mL — AB
CANNABINOIDS UR QL SCN: NEGATIVE ng/mL
Cocaine (Metab) Scrn, Ur: NEGATIVE ng/mL
Creatinine(Crt), U: 31.2 mg/dL (ref 20.0–300.0)
Methadone Screen, Urine: NEGATIVE ng/mL
OXYCODONE+OXYMORPHONE UR QL SCN: NEGATIVE ng/mL
Opiate Scrn, Ur: POSITIVE ng/mL — AB
Ph of Urine: 6.2 (ref 4.5–8.9)
Phencyclidine Qn, Ur: NEGATIVE ng/mL
Propoxyphene Scrn, Ur: NEGATIVE ng/mL

## 2023-09-08 ENCOUNTER — Encounter: Payer: Self-pay | Admitting: Internal Medicine

## 2023-09-14 ENCOUNTER — Encounter (INDEPENDENT_AMBULATORY_CARE_PROVIDER_SITE_OTHER): Payer: Self-pay

## 2023-09-20 ENCOUNTER — Ambulatory Visit (INDEPENDENT_AMBULATORY_CARE_PROVIDER_SITE_OTHER)

## 2023-09-20 ENCOUNTER — Encounter: Payer: Self-pay | Admitting: Family Medicine

## 2023-09-20 ENCOUNTER — Ambulatory Visit (INDEPENDENT_AMBULATORY_CARE_PROVIDER_SITE_OTHER): Admitting: Family Medicine

## 2023-09-20 ENCOUNTER — Other Ambulatory Visit: Payer: Self-pay

## 2023-09-20 VITALS — BP 138/88 | HR 96 | Ht 63.0 in

## 2023-09-20 DIAGNOSIS — G8929 Other chronic pain: Secondary | ICD-10-CM

## 2023-09-20 DIAGNOSIS — M25562 Pain in left knee: Secondary | ICD-10-CM | POA: Diagnosis not present

## 2023-09-20 DIAGNOSIS — M1712 Unilateral primary osteoarthritis, left knee: Secondary | ICD-10-CM | POA: Diagnosis not present

## 2023-09-20 DIAGNOSIS — M85862 Other specified disorders of bone density and structure, left lower leg: Secondary | ICD-10-CM | POA: Diagnosis not present

## 2023-09-20 NOTE — Progress Notes (Unsigned)
 I, Stevenson Clinch, CMA acting as a scribe for [name], MD.  Tiffany Medina is a 58 y.o. female who presents to [facility] at [facility] today for L knee pain. Pt was last seen by Dr. Denyse Amass on 07/12/23 for bilat knee pain and was given bilat steroid injections and was advised to cont working on quad strengthening and weight loss.  Today, pt c/o L knee pain after suffering a fall about a wk ago Monday. Tripped over carpet runner, hand hands full and landed on bot knees. Pt locates pain to entire knee joint. Bruising and swelling present. Has tried rest, elevation, staying hydrated, taking Hydrocodone and IBU prn.   L knee swelling: bilateral knees Treatments tried: IBU, elevation  Pertinent review of systems: No fevers or chills  Relevant historical information: History of a DVT.  Obesity.  COPD.   Exam:  BP 138/88   Pulse 96   Ht 5\' 3"  (1.6 m)   SpO2 96%   BMI 48.18 kg/m  General: Well Developed, well nourished, and in no acute distress.   MSK: Left knee varicosities present left leg.  Knee otherwise normal-appearing Tender palpation anterior knee. Stable ligamentous exam. Intact strength.    Lab and Radiology Results  Procedure: Real-time Ultrasound Guided Injection of left knee joint superior lateral patella space Device: Philips Affiniti 50G/GE Logiq Images permanently stored and available for review in PACS Verbal informed consent obtained.  Discussed risks and benefits of procedure. Warned about infection, bleeding, hyperglycemia damage to structures among others. Patient expresses understanding and agreement Time-out conducted.   Noted no overlying erythema, induration, or other signs of local infection.   Skin prepped in a sterile fashion.   Local anesthesia: Topical Ethyl chloride.   With sterile technique and under real time ultrasound guidance: 40 mg of Kenalog and 2 mL of Marcaine injected into knee joint. Fluid seen entering the joint  capsule.   Completed without difficulty   Pain immediately resolved suggesting accurate placement of the medication.   Advised to call if fevers/chills, erythema, induration, drainage, or persistent bleeding.   Images permanently stored and available for review in the ultrasound unit.  Impression: Technically successful ultrasound guided injection.     X-ray images left knee obtained today personally and independently interpreted. Moderate medial DJD.  No acute fractures are visible. Await formal radiology review   Assessment and Plan: 58 y.o. female with chronic left knee pain with an acute exacerbation.  Patient had steroid injection in that left knee just under 3 months ago.  She had a fall recently landing on the anterior knee.  Today's pain is due to exacerbation of underlying DJD.  Plan for repeat steroid injection and continue activity as tolerated.   PDMP not reviewed this encounter. Orders Placed This Encounter  Procedures   [place] LIMITED JOINT SPACE STRUCTURES LOW LEFT(NO LINKED CHARGES)    Reason for Exam (SYMPTOM  OR DIAGNOSIS REQUIRED):   left knee pain    Preferred imaging location?:   [facility] Knee AP/LAT W/[place] Left    Standing Status:   Future    Number of Occurrences:   1    Expiration Date:   10/21/2023    Reason for Exam (SYMPTOM  OR DIAGNOSIS REQUIRED):   left knee pain    Preferred imaging location?:   [place]    Is patient pregnant?:   No   No orders of the defined types were placed in this  encounter.    Discussed warning signs or symptoms. Please see discharge instructions. Patient expresses understanding.   The above documentation has been reviewed and is accurate and complete [name], M.D.

## 2023-09-20 NOTE — Patient Instructions (Addendum)
Thank you for coming in today.   Please get an Xray today before you leave   You received an injection today. Seek immediate medical attention if the joint becomes red, extremely painful, or is oozing fluid.

## 2023-10-04 ENCOUNTER — Encounter: Payer: Self-pay | Admitting: Sports Medicine

## 2023-10-05 ENCOUNTER — Ambulatory Visit: Admitting: Internal Medicine

## 2023-10-06 ENCOUNTER — Other Ambulatory Visit: Payer: Self-pay | Admitting: Internal Medicine

## 2023-10-06 DIAGNOSIS — F419 Anxiety disorder, unspecified: Secondary | ICD-10-CM

## 2023-10-07 MED ORDER — AMPHETAMINE-DEXTROAMPHETAMINE 5 MG PO TABS: ORAL_TABLET | ORAL | 0 refills | Status: DC

## 2023-10-07 MED ORDER — HYDROCODONE-ACETAMINOPHEN 7.5-325 MG PO TABS: 1.0000 | ORAL_TABLET | Freq: Three times a day (TID) | ORAL | 0 refills | Status: DC | PRN

## 2023-10-13 ENCOUNTER — Ambulatory Visit: Admitting: Family Medicine

## 2023-10-13 ENCOUNTER — Encounter: Payer: Self-pay | Admitting: Family Medicine

## 2023-10-13 ENCOUNTER — Other Ambulatory Visit: Payer: Self-pay

## 2023-10-13 ENCOUNTER — Telehealth: Payer: Self-pay

## 2023-10-13 VITALS — BP 112/86 | HR 95 | Ht 63.0 in | Wt 262.0 lb

## 2023-10-13 DIAGNOSIS — M1712 Unilateral primary osteoarthritis, left knee: Secondary | ICD-10-CM

## 2023-10-13 DIAGNOSIS — G8929 Other chronic pain: Secondary | ICD-10-CM

## 2023-10-13 DIAGNOSIS — M25562 Pain in left knee: Secondary | ICD-10-CM | POA: Diagnosis not present

## 2023-10-13 NOTE — Progress Notes (Signed)
   I, Stevenson Clinch, CMA acting as a scribe for [name], MD.  Tiffany Medina is a 58 y.o. female who presents to [facility] at [facility] today for worsening L knee pain and swelling. Pt was last seen by Dr. Denyse Amass on 09/20/23 and was given a L knee steroid injection and advised to cont to advance activity as tolerated.  Today, pt reports a few days of improvement after last injection. Feels like the knee is locking up. Notes worsening pain and swelling. Has been taking IBU and Hydrocodone-APAP. Using heat and ice. Denies fall. Radiating pain into the lower leg. Sx worse with prolonged sitting.   Dx imaging: 09/20/23 L knee XR  Pertinent review of systems: No fevers or chills  Relevant historical information: Obesity.  Actively losing weight.   Exam:  BP 112/86   Pulse 95   Ht 5\' 3"  (1.6 m)   Wt 262 lb (118.8 kg)   SpO2 96%   BMI 46.41 kg/m  General: Well Developed, well nourished, and in no acute distress.   MSK: Left knee mild effusion normal motion.  Tender palpation medial and posterior knee.    Lab and Radiology Results  Diagnostic Limited MSK Ultrasound of: Left knee Mild effusion.  Small Baker's cyst is present. Impression: Mild Baker's cyst.     Assessment and Plan: 58 y.o. female with chronic left knee pain due to DJD.  She had a conventional steroid injection less than a month ago which did not provide good relief or lasting relief.  She does have moderate DJD on x-ray.  The Baker's cyst that she does have is not large enough to be a major source of pain and is not big enough to drain.  Plan for authorization for Zilretta or gel shot injections.  Expect to do that soon.  Ultimately she is working on getting herself ready for a knee replacement.  She needs to get her BMI under 40.  Currently it is 46-1/2.  She is on track and is actively losing weight.   PDMP not reviewed this encounter. Orders Placed This Encounter  Procedures   [place] LIMITED  JOINT SPACE STRUCTURES UP LEFT(NO LINKED CHARGES)    Reason for Exam (SYMPTOM  OR DIAGNOSIS REQUIRED):   left knee pain    Preferred imaging location?:   [facility]   No orders of the defined types were placed in this encounter.    Discussed warning signs or symptoms. Please see discharge instructions. Patient expresses understanding.   The above documentation has been reviewed and is accurate and complete [name], M.D.

## 2023-10-13 NOTE — Telephone Encounter (Signed)
 Patient needs an appointment once medication has been stocked.   Zilretta approved for left knee. No prior authorization, medical notes or referrals needed. Patient has a [employer] plan with an effective date of 07/06/2023. Plan follows BCBS [place] guidelines. J4782 Arletta Bale) is covered at 100% by the payer at the contracted rate with no patient responsibility. Patient is responsible for a $120 copay for CPT code 95621 with the remaining covered at 100% by the payer at contracted rate. Deductibles do not apply to these services. Patient has a $120 copay whether or not an office visit is billed. Only one copay applies per date of service. Patient has an out of pocket maximum of $9200 and has accumulated $895.50. If out of pocket is met, copays will no longer apply.   Secondary insurance: [employer] is unable to complete your request for a benefit investigation at this time for J3304 (Zilretta) and CPT 20611. [facility] will not communicate with third parties.   Case #: [id number] Exp: 04/13/2024

## 2023-10-13 NOTE — Patient Instructions (Addendum)
 Thank you for coming in today.   We will work to authorize [employer] will hear from my office about scheduling, once we get Zilretta approved by your insurance

## 2023-10-18 NOTE — Telephone Encounter (Signed)
 Scheduled

## 2023-10-26 ENCOUNTER — Ambulatory Visit: Admitting: Family Medicine

## 2023-10-26 ENCOUNTER — Other Ambulatory Visit: Payer: Self-pay

## 2023-10-26 DIAGNOSIS — M25562 Pain in left knee: Secondary | ICD-10-CM | POA: Diagnosis not present

## 2023-10-26 DIAGNOSIS — G8929 Other chronic pain: Secondary | ICD-10-CM | POA: Diagnosis not present

## 2023-10-26 DIAGNOSIS — M1712 Unilateral primary osteoarthritis, left knee: Secondary | ICD-10-CM

## 2023-10-26 MED ORDER — TRIAMCINOLONE ACETONIDE 32 MG IX SRER
32.0000 mg | Freq: Once | INTRA_ARTICULAR | Status: AC
  Administered 2023-10-26: 32 mg via INTRA_ARTICULAR

## 2023-10-26 NOTE — Progress Notes (Signed)
  Zilretta  injection left knee Procedure: Real-time Ultrasound Guided Injection of left knee joint superior lateral patellar space Device: Philips Affiniti 50G Images permanently stored and available for review in PACS Verbal informed consent obtained.  Discussed risks and benefits of procedure. Warned about infection, hyperglycemia bleeding, damage to structures among others. Patient expresses understanding and agreement Time-out conducted.   Noted no overlying erythema, induration, or other signs of local infection.   Skin prepped in a sterile fashion.   Local anesthesia: Topical Ethyl chloride.   With sterile technique and under real time ultrasound guidance: Zilretta  32 mg injected into knee joint. Fluid seen entering the joint capsule.   Completed without difficulty   Advised to call if fevers/chills, erythema, induration, drainage, or persistent bleeding.   Images permanently stored and available for review in the ultrasound unit.  Impression: Technically successful ultrasound guided injection.  Lot number: [id number]

## 2023-10-26 NOTE — Patient Instructions (Addendum)
 Thank you for coming in today.   You received an injection today. Seek immediate medical attention if the joint becomes red, extremely painful, or is oozing fluid.   Check back as needed

## 2023-11-03 ENCOUNTER — Encounter: Payer: Self-pay | Admitting: Internal Medicine

## 2023-11-03 ENCOUNTER — Ambulatory Visit: Admitting: Internal Medicine

## 2023-11-03 VITALS — BP 128/90 | HR 88 | Temp 98.1°F | Ht 63.0 in | Wt 257.6 lb

## 2023-11-03 DIAGNOSIS — E038 Other specified hypothyroidism: Secondary | ICD-10-CM | POA: Diagnosis not present

## 2023-11-03 DIAGNOSIS — M5440 Lumbago with sciatica, unspecified side: Secondary | ICD-10-CM

## 2023-11-03 DIAGNOSIS — G9332 Myalgic encephalomyelitis/chronic fatigue syndrome: Secondary | ICD-10-CM | POA: Diagnosis not present

## 2023-11-03 DIAGNOSIS — F419 Anxiety disorder, unspecified: Secondary | ICD-10-CM

## 2023-11-03 DIAGNOSIS — F909 Attention-deficit hyperactivity disorder, unspecified type: Secondary | ICD-10-CM

## 2023-11-03 DIAGNOSIS — G8929 Other chronic pain: Secondary | ICD-10-CM

## 2023-11-03 DIAGNOSIS — R739 Hyperglycemia, unspecified: Secondary | ICD-10-CM | POA: Diagnosis not present

## 2023-11-03 DIAGNOSIS — L930 Discoid lupus erythematosus: Secondary | ICD-10-CM

## 2023-11-03 DIAGNOSIS — K2100 Gastro-esophageal reflux disease with esophagitis, without bleeding: Secondary | ICD-10-CM

## 2023-11-03 MED ORDER — LEVOTHYROXINE SODIUM 100 MCG PO TABS: 100.0000 ug | ORAL_TABLET | Freq: Every day | ORAL | 3 refills | Status: DC

## 2023-11-03 MED ORDER — HYDROCODONE-ACETAMINOPHEN 7.5-325 MG PO TABS: 1.0000 | ORAL_TABLET | Freq: Three times a day (TID) | ORAL | 0 refills | Status: DC | PRN

## 2023-11-03 MED ORDER — AMPHETAMINE-DEXTROAMPHETAMINE 5 MG PO TABS
ORAL_TABLET | ORAL | 0 refills | Status: DC
Start: 2023-11-03 — End: 2024-01-03

## 2023-11-03 MED ORDER — AMPHETAMINE-DEXTROAMPHETAMINE 5 MG PO TABS: ORAL_TABLET | ORAL | 0 refills | Status: DC

## 2023-11-03 MED ORDER — BD ECLIPSE SYRINGE 25G X 1" 3 ML MISC: 3 refills | Status: AC

## 2023-11-03 MED ORDER — ALPRAZOLAM 0.5 MG PO TABS: ORAL_TABLET | ORAL | 1 refills | Status: DC

## 2023-11-03 MED ORDER — CYANOCOBALAMIN 1000 MCG/ML IJ SOLN: 1000.0000 ug | INTRAMUSCULAR | 3 refills | Status: AC

## 2023-11-03 NOTE — Assessment & Plan Note (Signed)
 Stress reduction Rx Adderall - monitor HR  Potential benefits of a long term amphetamines  use as well as potential risks  and complications were explained to the patient and were aknowledged.

## 2023-11-03 NOTE — Assessment & Plan Note (Signed)
 On Protonix bid

## 2023-11-03 NOTE — Assessment & Plan Note (Signed)
Pool exercises

## 2023-11-03 NOTE — Progress Notes (Signed)
 Subjective:  Patient ID: Tiffany Medina, female    DOB: [birth year]  Age: 58 y.o. MRN: [id number]  CC: Medical Management of Chronic Issues (Med refill, vitamin b-12 vials and needles, hydrocodone , xanax , and adderral. Patient currently seeing healthy weight and wellness, lost 25 lbs)   HPI [name] presents for chronic pain, LBP, ADD Pt had a Zilretta  inj in the L knee - Dr Alease Hunter F/u on anxiety,    Outpatient Medications Prior to Visit  Medication Sig Dispense Refill   albuterol  (PROVENTIL  HFA) 108 (90 Base) MCG/ACT inhaler Inhale 2 puffs into the lungs every 4 (four) hours as needed for wheezing or shortness of breath. 8.5 g 11   ASPIRIN  81 PO Take 2 tablets by mouth daily.     buPROPion  (WELLBUTRIN  SR) 200 MG 12 hr tablet Take 1 tablet (200 mg total) by mouth daily. 90 tablet 3   calcitRIOL  (ROCALTROL ) 0.5 MCG capsule Take 1 capsule (0.5 mcg total) by mouth in the morning and at bedtime. 60 capsule 11   cyclobenzaprine  (FLEXERIL ) 10 MG tablet Take 1 tablet (10 mg total) by mouth 3 (three) times daily as needed. 90 tablet 1   diphenoxylate -atropine  (LOMOTIL ) 2.5-0.025 MG tablet Take 1 tablet by mouth 4 (four) times daily as needed for diarrhea or loose stools. 60 tablet 1   estradiol (VIVELLE-DOT) 0.1 MG/24HR patch APPLY 1 PATCH TRANSDERMALLY TWICE A WEEK     furosemide  (LASIX ) 20 MG tablet Take 1 tablet (20 mg total) by mouth daily. 90 tablet 3   ibuprofen  (ADVIL ) 800 MG tablet Take 1 tablet (800 mg total) by mouth every 8 (eight) hours as needed. 90 tablet 2   ondansetron  (ZOFRAN ) 4 MG tablet Take 1 tablet (4 mg total) by mouth every 8 (eight) hours as needed for nausea or vomiting. 20 tablet 1   pantoprazole  (PROTONIX ) 40 MG tablet Take 1 tablet (40 mg total) by mouth 2 (two) times daily. 180 tablet 3   ALPRAZolam  (XANAX ) 0.5 MG tablet TAKE 3 TABLETS BY MOUTH EVERY DAY AT BEDTIME AS NEEDED 90 tablet 1   amphetamine -dextroamphetamine  (ADDERALL) 5 MG tablet 5 mg in am, 5 mg at  lunch 60 tablet 0   amphetamine -dextroamphetamine  (ADDERALL) 5 MG tablet 5 mg in am, 5 mg at lunch 60 tablet 0   cyanocobalamin  (VITAMIN B12) 1000 MCG/ML injection Inject 1 mL (1,000 mcg total) into the muscle every 14 (fourteen) days. 6 mL 3   HYDROcodone -acetaminophen  (NORCO) 7.5-325 MG tablet Take 1 tablet by mouth every 8 (eight) hours as needed for severe pain (pain score 7-10). 90 tablet 0   HYDROcodone -acetaminophen  (NORCO) 7.5-325 MG tablet Take 1 tablet by mouth every 8 (eight) hours as needed for severe pain (pain score 7-10). 90 tablet 0   levothyroxine  (SYNTHROID ) 100 MCG tablet TAKE 1/2 TABLET BY MOUTH ONCE DAILY BEFORE BREAKFAST (50mcg) 90 tablet 3   Semaglutide -Weight Management (WEGOVY ) 0.5 MG/0.5ML SOAJ Inject 0.5 mg into the skin once a week. (Patient not taking: Reported on 11/03/2023) 2 mL 0   No facility-administered medications prior to visit.    ROS: Review of Systems  Constitutional:  Negative for activity change, appetite change, chills, fatigue and unexpected weight change.  HENT:  Negative for congestion, mouth sores, postnasal drip and sinus pressure.   Eyes:  Negative for visual disturbance.  Respiratory:  Negative for cough and chest tightness.   Gastrointestinal:  Negative for abdominal pain and nausea.  Genitourinary:  Negative for difficulty urinating, frequency and vaginal pain.  Musculoskeletal:  Positive for arthralgias, back pain and gait problem.  Skin:  Negative for pallor and rash.  Neurological:  Negative for dizziness, tremors, weakness, numbness and headaches.  Psychiatric/Behavioral:  Negative for confusion and sleep disturbance. The patient is not nervous/anxious.     Objective:  BP (!) 128/90   Pulse 88   Temp 98.1 F (36.7 C)   Ht 5\' 3"  (1.6 m)   Wt 257 lb 9.6 oz (116.8 kg)   SpO2 96%   BMI 45.63 kg/m   BP Readings from Last 3 Encounters:  11/03/23 (!) 128/90  10/13/23 112/86  09/20/23 138/88    Wt Readings from Last 3 Encounters:   11/03/23 257 lb 9.6 oz (116.8 kg)  10/13/23 262 lb (118.8 kg)  09/06/23 272 lb (123.4 kg)    Physical Exam Constitutional:      General: She is not in acute distress.    Appearance: She is well-developed. She is obese.  HENT:     Head: Normocephalic.     Right Ear: External ear normal.     Left Ear: External ear normal.     Nose: Nose normal.  Eyes:     General:        Right eye: No discharge.        Left eye: No discharge.     Conjunctiva/sclera: Conjunctivae normal.     Pupils: Pupils are equal, round, and reactive to light.  Neck:     Thyroid : No thyromegaly.     Vascular: No JVD.     Trachea: No tracheal deviation.  Cardiovascular:     Rate and Rhythm: Normal rate and regular rhythm.     Heart sounds: Normal heart sounds.  Pulmonary:     Effort: No respiratory distress.     Breath sounds: No stridor. No wheezing.  Abdominal:     General: Bowel sounds are normal. There is no distension.     Palpations: Abdomen is soft. There is no mass.     Tenderness: There is no abdominal tenderness. There is no guarding or rebound.  Musculoskeletal:        General: Tenderness present.     Cervical back: Normal range of motion and neck supple. No rigidity.     Right lower leg: No edema.     Left lower leg: No edema.  Lymphadenopathy:     Cervical: No cervical adenopathy.  Skin:    Findings: No erythema or rash.  Neurological:     Mental Status: She is oriented to person, place, and time.     Cranial Nerves: No cranial nerve deficit.     Motor: No abnormal muscle tone.     Coordination: Coordination normal.     Deep Tendon Reflexes: Reflexes normal.  Psychiatric:        Behavior: Behavior normal.        Thought Content: Thought content normal.        Judgment: Judgment normal.     Lab Results  Component Value Date   WBC 7.9 09/06/2023   HGB 15.7 (H) 09/06/2023   HCT 47.8 (H) 09/06/2023   PLT 411.0 (H) 09/06/2023   GLUCOSE 101 (H) 09/06/2023   CHOL 154 09/06/2023    TRIG 107.0 09/06/2023   HDL 52.80 09/06/2023   LDLCALC 79 09/06/2023   ALT 23 09/06/2023   AST 25 09/06/2023   NA 140 09/06/2023   K 4.0 09/06/2023   CL 98 09/06/2023   CREATININE 1.09 09/06/2023   BUN 16 09/06/2023  CO2 31 09/06/2023   TSH 0.81 09/06/2023   INR 1.02 11/28/2014   HGBA1C 6.3 04/19/2023    DG INJECT DIAG/THERA/INC NEEDLE/CATH/PLC EPI/LUMB/SAC W/IMG Result Date: 01/25/2023 CLINICAL DATA:  Low back pain radiating to the left leg all the way to the foot. FLUOROSCOPY: Radiation Exposure Index (as provided by the fluoroscopic device): 0 minutes 42 seconds. 70.58 micro gray meter squared PROCEDURE: The procedure, risks, benefits, and alternatives were explained to the patient. Questions regarding the procedure were encouraged and answered. The patient understands and consents to the procedure. LUMBAR EPIDURAL INJECTION: An interlaminar approach was performed on the left at L5-S1. The overlying skin was cleansed and anesthetized. A 20 gauge epidural needle was advanced using loss-of-resistance technique. DIAGNOSTIC EPIDURAL INJECTION: Injection of Isovue -M 200 shows a good epidural pattern with spread above and below the level of needle placement, primarily on the left no vascular opacification is seen. THERAPEUTIC EPIDURAL INJECTION: Eighty mg of Depo-Medrol  mixed with 2 cc 1% lidocaine were instilled. The procedure was well-tolerated, and the patient was discharged thirty minutes following the injection in good condition. COMPLICATIONS: None IMPRESSION: Technically successful L5-S1 left epidural steroid injection. Electronically Signed   By: Bettylou Brunner M.D.   On: 01/25/2023 15:02    Assessment & Plan:   Problem List Items Addressed This Visit     CFS (chronic fatigue syndrome) - Primary   Stress reduction Rx Adderall - monitor HR  Potential benefits of a long term amphetamines  use as well as potential risks  and complications were explained to the patient and were  aknowledged.      Relevant Orders   Hemoglobin A1c   Comprehensive metabolic panel with GFR   TSH   T4, free   Hypothyroidism   Chronic  Cont on Levothroid 100 mcg/d      Relevant Medications   levothyroxine  (SYNTHROID ) 100 MCG tablet   Discoid lupus   Pool exercises      Relevant Orders   Hemoglobin A1c   Comprehensive metabolic panel with GFR   TSH   T4, free   ADD (attention deficit disorder)   Rx Adderall - monitor HR  Potential benefits of a long term amphetamines  use as well as potential risks  and complications were explained to the patient and were aknowledged.      Relevant Orders   Hemoglobin A1c   Comprehensive metabolic panel with GFR   TSH   T4, free   GERD (gastroesophageal reflux disease)   On  Protonix  bid      Low back pain   UDS - THC ?due to CBD cream use: quit. Repeat test was (-). Re-start Norco; monitor UDS Positive THC on the drug screen test came from CBD cream per patient On Norco to 7.5/325 mg  Potential benefits of a long term opioids use as well as potential risks (i.e. addiction risk, apnea etc) and complications (i.e. Somnolence, constipation and others) were explained to the patient and were aknowledged.      Relevant Medications   HYDROcodone -acetaminophen  (NORCO) 7.5-325 MG tablet   HYDROcodone -acetaminophen  (NORCO) 7.5-325 MG tablet   Hyperglycemia   Relevant Orders   Hemoglobin A1c   Anxiety disorder   Relevant Medications   ALPRAZolam  (XANAX ) 0.5 MG tablet      Meds ordered this encounter  Medications   ALPRAZolam  (XANAX ) 0.5 MG tablet    Sig: TAKE 3 TABLETS BY MOUTH EVERY DAY AT BEDTIME AS NEEDED    Dispense:  90 tablet  Refill:  1   amphetamine -dextroamphetamine  (ADDERALL) 5 MG tablet    Sig: 5 mg in am, 5 mg at lunch    Dispense:  60 tablet    Refill:  0    Please fill on or after 11/06/23   amphetamine -dextroamphetamine  (ADDERALL) 5 MG tablet    Sig: 5 mg in am, 5 mg at lunch    Dispense:  60 tablet     Refill:  0    Please fill on or after 12/06/23   cyanocobalamin  (VITAMIN B12) 1000 MCG/ML injection    Sig: Inject 1 mL (1,000 mcg total) into the skin every 14 (fourteen) days.    Dispense:  6 mL    Refill:  3   HYDROcodone -acetaminophen  (NORCO) 7.5-325 MG tablet    Sig: Take 1 tablet by mouth every 8 (eight) hours as needed for severe pain (pain score 7-10).    Dispense:  90 tablet    Refill:  0    Please fill on or after 11/06/23   HYDROcodone -acetaminophen  (NORCO) 7.5-325 MG tablet    Sig: Take 1 tablet by mouth every 8 (eight) hours as needed for severe pain (pain score 7-10).    Dispense:  90 tablet    Refill:  0    Please fill on or after 12/06/23   SYRINGE-NEEDLE, DISP, 3 ML (BD ECLIPSE SYRINGE) 25G X 1" 3 ML MISC    Sig: As directed    Dispense:  50 each    Refill:  3   levothyroxine  (SYNTHROID ) 100 MCG tablet    Sig: Take 1 tablet (100 mcg total) by mouth daily before breakfast. TAKE 1/2 TABLET BY MOUTH ONCE DAILY BEFORE BREAKFAST (50mcg)    Dispense:  90 tablet    Refill:  3    Keep follow-up appt for future refills      Follow-up: Return in about 2 months (around 01/03/2024) for a follow-up visit.  Anitra Barn, MD

## 2023-11-03 NOTE — Assessment & Plan Note (Signed)
UDS - THC ?due to CBD cream use: quit. Repeat test was (-). Re-start Norco; monitor UDS Positive THC on the drug screen test came from CBD cream per patient On Norco to 7.5/325 mg  Potential benefits of a long term opioids use as well as potential risks (i.e. addiction risk, apnea etc) and complications (i.e. Somnolence, constipation and others) were explained to the patient and were aknowledged.

## 2023-11-03 NOTE — Assessment & Plan Note (Signed)
 Rx Adderall - monitor HR  Potential benefits of a long term amphetamines  use as well as potential risks  and complications were explained to the patient and were aknowledged.

## 2023-11-03 NOTE — Assessment & Plan Note (Signed)
Chronic  Cont on Levothroid 100 mcg/d

## 2023-12-05 ENCOUNTER — Telehealth: Payer: Self-pay | Admitting: Internal Medicine

## 2023-12-05 NOTE — Telephone Encounter (Unsigned)
 Copied from CRM [id number]. Topic: Clinical - Medication Refill >> Dec 05, 2023 10:14 AM Bridget[facility] T wrote: Medication: amphetamine -dextroamphetamine  (ADDERALL) 5 MG tablet HYDROcodone -acetaminophen  (NORCO) 7.5-325 MG tablet  Has the patient contacted their pharmacy? No (Agent: If no, request that the patient contact the pharmacy for the refill. If patient does not wish to contact the pharmacy document the reason why and proceed with request.) (Agent: If yes, when and what did the pharmacy advise?)  This is the patient's preferred pharmacy:  [id number], [place] - [address] AT [facility] OF [facility] & [address] [facility] [place] [address] Phone: [phone] Fax: [phone]    Is this the correct pharmacy for this prescription? Yes If no, delete pharmacy and type the correct one.   Has the prescription been filled recently? Yes  Is the patient out of the medication? No  Has the patient been seen for an appointment in the last year OR does the patient have an upcoming appointment? Yes  Can we respond through MyChart? Yes  Agent: Please be advised that Rx refills may take up to 3 business days. We ask that you follow-up with your pharmacy.

## 2023-12-07 MED ORDER — AMPHETAMINE-DEXTROAMPHETAMINE 5 MG PO TABS: ORAL_TABLET | ORAL | 0 refills | Status: DC

## 2023-12-07 MED ORDER — HYDROCODONE-ACETAMINOPHEN 7.5-325 MG PO TABS: 1.0000 | ORAL_TABLET | Freq: Three times a day (TID) | ORAL | 0 refills | Status: DC | PRN

## 2023-12-21 ENCOUNTER — Ambulatory Visit (INDEPENDENT_AMBULATORY_CARE_PROVIDER_SITE_OTHER): Admitting: Family Medicine

## 2023-12-21 ENCOUNTER — Other Ambulatory Visit (INDEPENDENT_AMBULATORY_CARE_PROVIDER_SITE_OTHER)

## 2023-12-21 ENCOUNTER — Ambulatory Visit: Payer: Self-pay

## 2023-12-21 VITALS — BP 132/88 | HR 86 | Ht 63.0 in | Wt 254.0 lb

## 2023-12-21 DIAGNOSIS — L930 Discoid lupus erythematosus: Secondary | ICD-10-CM | POA: Diagnosis not present

## 2023-12-21 DIAGNOSIS — R739 Hyperglycemia, unspecified: Secondary | ICD-10-CM

## 2023-12-21 DIAGNOSIS — M545 Low back pain, unspecified: Secondary | ICD-10-CM

## 2023-12-21 DIAGNOSIS — M25561 Pain in right knee: Secondary | ICD-10-CM | POA: Diagnosis not present

## 2023-12-21 DIAGNOSIS — G9332 Myalgic encephalomyelitis/chronic fatigue syndrome: Secondary | ICD-10-CM | POA: Diagnosis not present

## 2023-12-21 DIAGNOSIS — F909 Attention-deficit hyperactivity disorder, unspecified type: Secondary | ICD-10-CM

## 2023-12-21 DIAGNOSIS — G8929 Other chronic pain: Secondary | ICD-10-CM

## 2023-12-21 LAB — COMPREHENSIVE METABOLIC PANEL WITH GFR
ALT: 21 U/L (ref 0–35)
AST: 19 U/L (ref 0–37)
Albumin: 4.4 g/dL (ref 3.5–5.2)
Alkaline Phosphatase: 82 U/L (ref 39–117)
BUN: 25 mg/dL — ABNORMAL HIGH (ref 6–23)
CO2: 34 meq/L — ABNORMAL HIGH (ref 19–32)
Calcium: 9.8 mg/dL (ref 8.4–10.5)
Chloride: 100 meq/L (ref 96–112)
Creatinine, Ser: 1.05 mg/dL (ref 0.40–1.20)
GFR: 58.31 mL/min — ABNORMAL LOW (ref >60.00)
Glucose, Bld: 96 mg/dL (ref 70–99)
Potassium: 4.5 meq/L (ref 3.5–5.1)
Sodium: 140 meq/L (ref 135–145)
Total Bilirubin: 0.3 mg/dL (ref 0.2–1.2)
Total Protein: 7.6 g/dL (ref 6.0–8.3)

## 2023-12-21 LAB — TSH: TSH: 0.6 u[IU]/mL (ref 0.35–5.50)

## 2023-12-21 LAB — T4, FREE: Free T4: 1.41 ng/dL (ref 0.60–1.60)

## 2023-12-21 LAB — HEMOGLOBIN A1C: Hgb A1c MFr Bld: 6.2 % (ref 4.6–6.5)

## 2023-12-21 NOTE — Patient Instructions (Addendum)
 Thank you for coming in today.   You received an injection today. Seek immediate medical attention if the joint becomes red, extremely painful, or is oozing fluid.   Try Aquatic Therapy at the [facility], if not better in 1 month, we can order PT.

## 2023-12-21 NOTE — Progress Notes (Signed)
 Joanna Muck, PhD, LAT, ATC acting as a scribe for Garlan Juniper, MD.  Ottilie Wigglesworth is a 58 y.o. female who presents to [facility] today for R knee and back pain. Pt was previously seen by Dr. Alease Hunter on 10/26/23 for a Zilretta  injection in her L knee. Last R knee steroid injection, 07/12/23.  Today, pt c/o her R knee pain returning over the last couple weeks. She thinks she has been compensating   Pt also c/o low back pain x 2-wks. No falls.  Pt locates pain to both sides of her low back. She knows she is needing surgery, but is wanting to get her knees replaced 1st.   Dx imaging: 10/15/22 R knee XR  Pertinent review of systems: No fevers or chills  Relevant historical information: History of a DVT.  Obesity.  Patient has tried several facet medial branch block and ablation attempts with little success lumbar spine.   Exam:  BP 132/88   Pulse 86   Ht 5' 3 (1.6 m)   Wt 254 lb (115.2 kg)   SpO2 94%   BMI 44.99 kg/m  General: Well Developed, well nourished, and in no acute distress.   MSK: Right knee moderate effusion normal-appearing otherwise.  Normal motion.  Intact strength.  L-spine nontender palpation spinal midline.  Tender palpation paraspinal musculature.    Lab and Radiology Results  Procedure: Real-time Ultrasound Guided Injection of right knee joint superior lateral patella space Device: Philips Affiniti 50G/GE Logiq Images permanently stored and available for review in PACS Verbal informed consent obtained.  Discussed risks and benefits of procedure. Warned about infection, bleeding, hyperglycemia damage to structures among others. Patient expresses understanding and agreement Time-out conducted.   Noted no overlying erythema, induration, or other signs of local infection.   Skin prepped in a sterile fashion.   Local anesthesia: Topical Ethyl chloride.   With sterile technique and under real time ultrasound guidance: 40 mg of  Kenalog  and 2 mL of Marcaine injected into knee joint. Fluid seen entering the joint capsule.   Completed without difficulty   Pain immediately resolved suggesting accurate placement of the medication.   Advised to call if fevers/chills, erythema, induration, drainage, or persistent bleeding.   Images permanently stored and available for review in the ultrasound unit.  Impression: Technically successful ultrasound guided injection.      Assessment and Plan: 58 y.o. female with chronic right knee pain due to exacerbation of DJD.  Plan for steroid injection.  Recheck back as needed for this.  Lumbar spine pain.  Patient does have degenerative changes on previous imaging which could be a factor.  Today's pain is more likely related to the muscle spasm and dysfunction in this area.  She has a plan to start aquatic exercise when she gets to be a member of the gym which I think could be quite helpful.  I think it is reasonable to give that a try and if that is not sufficiently helpful she will let me know and I can refer to physical therapy.  She lives in [place].  We can find a PT location there.   PDMP not reviewed this encounter. Orders Placed This Encounter  Procedures   US  LIMITED JOINT SPACE STRUCTURES LOW RIGHT(NO LINKED CHARGES)    Reason for Exam (SYMPTOM  OR DIAGNOSIS REQUIRED):   right knee pain    Preferred imaging location?:   [facility]   No orders of the defined  types were placed in this encounter.    Discussed warning signs or symptoms. Please see discharge instructions. Patient expresses understanding.   The above documentation has been reviewed and is accurate and complete Garlan Juniper, M.D.

## 2023-12-22 ENCOUNTER — Ambulatory Visit: Payer: Self-pay | Admitting: Internal Medicine

## 2023-12-22 LAB — PMP SCREEN PROFILE (10S), URINE
Amphetamine Scrn, Ur: NEGATIVE ng/mL
BARBITURATE SCREEN URINE: NEGATIVE ng/mL
BENZODIAZEPINE SCREEN, URINE: NEGATIVE ng/mL
CANNABINOIDS UR QL SCN: NEGATIVE ng/mL
Cocaine (Metab) Scrn, Ur: NEGATIVE ng/mL
Creatinine(Crt), U: 26.5 mg/dL (ref 20.0–300.0)
Methadone Screen, Urine: NEGATIVE ng/mL
OXYCODONE+OXYMORPHONE UR QL SCN: NEGATIVE ng/mL
Opiate Scrn, Ur: POSITIVE ng/mL — AB
Ph of Urine: 6.6 (ref 4.5–8.9)
Phencyclidine Qn, Ur: NEGATIVE ng/mL
Propoxyphene Scrn, Ur: NEGATIVE ng/mL

## 2023-12-27 ENCOUNTER — Other Ambulatory Visit: Payer: Self-pay | Admitting: Internal Medicine

## 2024-01-03 ENCOUNTER — Encounter: Payer: Self-pay | Admitting: Internal Medicine

## 2024-01-03 ENCOUNTER — Ambulatory Visit (INDEPENDENT_AMBULATORY_CARE_PROVIDER_SITE_OTHER): Admitting: Internal Medicine

## 2024-01-03 VITALS — BP 122/82 | HR 86 | Temp 98.6°F | Ht 63.0 in | Wt 232.0 lb

## 2024-01-03 DIAGNOSIS — F419 Anxiety disorder, unspecified: Secondary | ICD-10-CM

## 2024-01-03 DIAGNOSIS — K130 Diseases of lips: Secondary | ICD-10-CM | POA: Diagnosis not present

## 2024-01-03 DIAGNOSIS — M5440 Lumbago with sciatica, unspecified side: Secondary | ICD-10-CM | POA: Diagnosis not present

## 2024-01-03 DIAGNOSIS — G8929 Other chronic pain: Secondary | ICD-10-CM | POA: Diagnosis not present

## 2024-01-03 DIAGNOSIS — F909 Attention-deficit hyperactivity disorder, unspecified type: Secondary | ICD-10-CM | POA: Diagnosis not present

## 2024-01-03 DIAGNOSIS — G47419 Narcolepsy without cataplexy: Secondary | ICD-10-CM | POA: Insufficient documentation

## 2024-01-03 MED ORDER — AMPHETAMINE-DEXTROAMPHETAMINE 5 MG PO TABS: ORAL_TABLET | ORAL | 0 refills | Status: DC

## 2024-01-03 MED ORDER — ALPRAZOLAM 0.5 MG PO TABS: ORAL_TABLET | ORAL | 1 refills | Status: DC

## 2024-01-03 MED ORDER — HYDROCODONE-ACETAMINOPHEN 7.5-325 MG PO TABS: 1.0000 | ORAL_TABLET | Freq: Three times a day (TID) | ORAL | 0 refills | Status: DC | PRN

## 2024-01-03 MED ORDER — CLOTRIMAZOLE-BETAMETHASONE 1-0.05 % EX CREA: 1.0000 | TOPICAL_CREAM | Freq: Two times a day (BID) | CUTANEOUS | 0 refills | Status: DC

## 2024-01-03 NOTE — Progress Notes (Signed)
 Subjective:  Patient ID: Tiffany Medina, female    DOB: 1965/09/22  Age: 58 y.o. MRN: [id number]  CC: Medical Management of Chronic Issues (2 mnth f/u)   HPI Sana Tessmer presents for chronic pain, lupus, CFS, hypothyroidism  Outpatient Medications Prior to Visit  Medication Sig Dispense Refill   albuterol  (PROVENTIL  HFA) 108 (90 Base) MCG/ACT inhaler Inhale 2 puffs into the lungs every 4 (four) hours as needed for wheezing or shortness of breath. 8.5 g 11   ASPIRIN  81 PO Take 2 tablets by mouth daily.     buPROPion  (WELLBUTRIN  SR) 200 MG 12 hr tablet Take 1 tablet (200 mg total) by mouth daily. 90 tablet 3   calcitRIOL  (ROCALTROL ) 0.5 MCG capsule Take 1 capsule (0.5 mcg total) by mouth in the morning and at bedtime. 60 capsule 11   cyanocobalamin  (VITAMIN B12) 1000 MCG/ML injection Inject 1 mL (1,000 mcg total) into the skin every 14 (fourteen) days. 6 mL 3   cyclobenzaprine  (FLEXERIL ) 10 MG tablet Take 1 tablet (10 mg total) by mouth 3 (three) times daily as needed. 90 tablet 1   diphenoxylate -atropine  (LOMOTIL ) 2.5-0.025 MG tablet Take 1 tablet by mouth 4 (four) times daily as needed for diarrhea or loose stools. 60 tablet 1   estradiol (VIVELLE-DOT) 0.1 MG/24HR patch APPLY 1 PATCH TRANSDERMALLY TWICE A WEEK     furosemide  (LASIX ) 20 MG tablet Take 1 tablet (20 mg total) by mouth daily. 90 tablet 3   ibuprofen  (ADVIL ) 800 MG tablet TAKE 1 TABLET(800 MG) BY MOUTH EVERY 8 HOURS AS NEEDED 90 tablet 0   levothyroxine  (SYNTHROID ) 100 MCG tablet Take 1 tablet (100 mcg total) by mouth daily before breakfast. TAKE 1/2 TABLET BY MOUTH ONCE DAILY BEFORE BREAKFAST (50mcg) 90 tablet 3   ondansetron  (ZOFRAN ) 4 MG tablet Take 1 tablet (4 mg total) by mouth every 8 (eight) hours as needed for nausea or vomiting. 20 tablet 1   pantoprazole  (PROTONIX ) 40 MG tablet Take 1 tablet (40 mg total) by mouth 2 (two) times daily. 180 tablet 3   SYRINGE-NEEDLE, DISP, 3 ML (BD ECLIPSE SYRINGE) 25G X 1 3 ML MISC  As directed 50 each 3   ALPRAZolam  (XANAX ) 0.5 MG tablet TAKE 3 TABLETS BY MOUTH EVERY DAY AT BEDTIME AS NEEDED 90 tablet 1   amphetamine -dextroamphetamine  (ADDERALL) 5 MG tablet 5 mg in am, 5 mg at lunch 60 tablet 0   amphetamine -dextroamphetamine  (ADDERALL) 5 MG tablet 5 mg in am, 5 mg at lunch 60 tablet 0   HYDROcodone -acetaminophen  (NORCO) 7.5-325 MG tablet Take 1 tablet by mouth every 8 (eight) hours as needed for severe pain (pain score 7-10). 90 tablet 0   HYDROcodone -acetaminophen  (NORCO) 7.5-325 MG tablet Take 1 tablet by mouth every 8 (eight) hours as needed for severe pain (pain score 7-10). 90 tablet 0   No facility-administered medications prior to visit.    ROS: Review of Systems  Constitutional:  Positive for fatigue and unexpected weight change. Negative for activity change, appetite change and chills.  HENT:  Negative for congestion, mouth sores and sinus pressure.   Eyes:  Negative for visual disturbance.  Respiratory:  Positive for wheezing. Negative for cough and chest tightness.   Cardiovascular:  Positive for leg swelling.  Gastrointestinal:  Negative for abdominal pain and nausea.  Genitourinary:  Negative for difficulty urinating, frequency and vaginal pain.  Musculoskeletal:  Positive for arthralgias and back pain. Negative for gait problem.  Skin:  Positive for color change and rash. Negative  for pallor.  Neurological:  Negative for dizziness, tremors, weakness, numbness and headaches.  Hematological:  Does not bruise/bleed easily.  Psychiatric/Behavioral:  Positive for decreased concentration. Negative for confusion, sleep disturbance and suicidal ideas. The patient is nervous/anxious.     Objective:  BP 122/82   Pulse 86   Temp 98.6 F (37 C) (Oral)   Ht 5' 3 (1.6 m)   Wt 232 lb (105.2 kg)   SpO2 95%   BMI 41.10 kg/m   BP Readings from Last 3 Encounters:  01/03/24 122/82  12/21/23 132/88  11/03/23 (!) 128/90    Wt Readings from Last 3  Encounters:  01/03/24 232 lb (105.2 kg)  12/21/23 254 lb (115.2 kg)  11/03/23 257 lb 9.6 oz (116.8 kg)    Physical Exam Constitutional:      General: She is not in acute distress.    Appearance: She is well-developed.  HENT:     Head: Normocephalic.     Right Ear: External ear normal.     Left Ear: External ear normal.     Nose: Nose normal.   Eyes:     General:        Right eye: No discharge.        Left eye: No discharge.     Conjunctiva/sclera: Conjunctivae normal.     Pupils: Pupils are equal, round, and reactive to light.   Neck:     Thyroid : No thyromegaly.     Vascular: No JVD.     Trachea: No tracheal deviation.   Cardiovascular:     Rate and Rhythm: Normal rate and regular rhythm.     Heart sounds: Normal heart sounds.  Pulmonary:     Effort: No respiratory distress.     Breath sounds: No stridor. No wheezing.  Abdominal:     General: Bowel sounds are normal. There is no distension.     Palpations: Abdomen is soft. There is no mass.     Tenderness: There is no abdominal tenderness. There is no guarding or rebound.   Musculoskeletal:        General: No tenderness.     Cervical back: Normal range of motion and neck supple. No rigidity.     Right lower leg: No edema.     Left lower leg: No edema.  Lymphadenopathy:     Cervical: No cervical adenopathy.   Skin:    Findings: No erythema or rash.   Neurological:     Cranial Nerves: No cranial nerve deficit.     Motor: No abnormal muscle tone.     Coordination: Coordination normal.     Deep Tendon Reflexes: Reflexes normal.   Psychiatric:        Behavior: Behavior normal.        Thought Content: Thought content normal.        Judgment: Judgment normal.    LS Spine w/pain L>R knee pain   Lab Results  Component Value Date   WBC 7.9 09/06/2023   HGB 15.7 (H) 09/06/2023   HCT 47.8 (H) 09/06/2023   PLT 411.0 (H) 09/06/2023   GLUCOSE 96 12/21/2023   CHOL 154 09/06/2023   TRIG 107.0 09/06/2023    HDL 52.80 09/06/2023   LDLCALC 79 09/06/2023   ALT 21 12/21/2023   AST 19 12/21/2023   NA 140 12/21/2023   K 4.5 12/21/2023   CL 100 12/21/2023   CREATININE 1.05 12/21/2023   BUN 25 (H) 12/21/2023   CO2 34 (H) 12/21/2023  TSH 0.60 12/21/2023   INR 1.02 11/28/2014   HGBA1C 6.2 12/21/2023    DG INJECT DIAG/THERA/INC NEEDLE/CATH/PLC EPI/LUMB/SAC W/IMG Result Date: 01/25/2023 CLINICAL DATA:  Low back pain radiating to the left leg all the way to the foot. FLUOROSCOPY: Radiation Exposure Index (as provided by the fluoroscopic device): 0 minutes 42 seconds. 70.58 micro gray meter squared PROCEDURE: The procedure, risks, benefits, and alternatives were explained to the patient. Questions regarding the procedure were encouraged and answered. The patient understands and consents to the procedure. LUMBAR EPIDURAL INJECTION: An interlaminar approach was performed on the left at L5-S1. The overlying skin was cleansed and anesthetized. A 20 gauge epidural needle was advanced using loss-of-resistance technique. DIAGNOSTIC EPIDURAL INJECTION: Injection of Isovue -M 200 shows a good epidural pattern with spread above and below the level of needle placement, primarily on the left no vascular opacification is seen. THERAPEUTIC EPIDURAL INJECTION: Eighty mg of Depo-Medrol  mixed with 2 cc 1% lidocaine were instilled. The procedure was well-tolerated, and the patient was discharged thirty minutes following the injection in good condition. COMPLICATIONS: None IMPRESSION: Technically successful L5-S1 left epidural steroid injection. Electronically Signed   By: Oneil Officer M.D.   On: 01/25/2023 15:02    Assessment & Plan:   Problem List Items Addressed This Visit     ADD (attention deficit disorder)   Rx Adderall - monitor HR  Potential benefits of a long term amphetamines  use as well as potential risks  and complications were explained to the patient and were aknowledged.      Low back pain - Primary    On  Norco to 7.5/325 mg  Potential benefits of a long term opioids use as well as potential risks (i.e. addiction risk, apnea etc) and complications (i.e. Somnolence, constipation and others) were explained to the patient and were aknowledged.      Relevant Medications   HYDROcodone -acetaminophen  (NORCO) 7.5-325 MG tablet   HYDROcodone -acetaminophen  (NORCO) 7.5-325 MG tablet   Anxiety disorder   Relevant Medications   ALPRAZolam  (XANAX ) 0.5 MG tablet   Narcolepsy   On Adderall  Potential benefits of a long term amphetamines  use as well as potential risks  and complications were explained to the patient and were aknowledged.       Angular stomatitis   Lotrisone bid         Meds ordered this encounter  Medications   ALPRAZolam  (XANAX ) 0.5 MG tablet    Sig: TAKE 3 TABLETS BY MOUTH EVERY DAY AT BEDTIME AS NEEDED    Dispense:  90 tablet    Refill:  1   amphetamine -dextroamphetamine  (ADDERALL) 5 MG tablet    Sig: 5 mg in am, 5 mg at lunch    Dispense:  60 tablet    Refill:  0    Please fill on or after 02/04/24 Code: G47.419   HYDROcodone -acetaminophen  (NORCO) 7.5-325 MG tablet    Sig: Take 1 tablet by mouth every 8 (eight) hours as needed for severe pain (pain score 7-10).    Dispense:  90 tablet    Refill:  0    Please fill on or after 02/04/24 Code: M54.50   HYDROcodone -acetaminophen  (NORCO) 7.5-325 MG tablet    Sig: Take 1 tablet by mouth every 8 (eight) hours as needed for severe pain (pain score 7-10).    Dispense:  90 tablet    Refill:  0    Please fill on or after 01/05/24 Code: M54.50   amphetamine -dextroamphetamine  (ADDERALL) 5 MG tablet  Sig: 5 mg in am, 5 mg at lunch    Dispense:  60 tablet    Refill:  0    Please fill on or after 01/05/24 Code: G47.419   clotrimazole-betamethasone (LOTRISONE) cream    Sig: Apply 1 Application topically 2 (two) times daily. Use prn    Dispense:  60 g    Refill:  0      Follow-up: Return in about 2 months (around 03/05/2024) for a  follow-up visit.  Marolyn Noel, MD

## 2024-01-03 NOTE — Assessment & Plan Note (Signed)
 Rx Adderall - monitor HR  Potential benefits of a long term amphetamines  use as well as potential risks  and complications were explained to the patient and were aknowledged.

## 2024-01-03 NOTE — Assessment & Plan Note (Signed)
 On Adderall  Potential benefits of a long term amphetamines  use as well as potential risks  and complications were explained to the patient and were aknowledged.

## 2024-01-03 NOTE — Assessment & Plan Note (Signed)
Lotrisone bid  

## 2024-01-03 NOTE — Assessment & Plan Note (Signed)
  On Norco to 7.5/325 mg  Potential benefits of a long term opioids use as well as potential risks (i.e. addiction risk, apnea etc) and complications (i.e. Somnolence, constipation and others) were explained to the patient and were aknowledged.

## 2024-01-13 DIAGNOSIS — L820 Inflamed seborrheic keratosis: Secondary | ICD-10-CM | POA: Diagnosis not present

## 2024-01-19 ENCOUNTER — Other Ambulatory Visit: Payer: Self-pay | Admitting: Internal Medicine

## 2024-02-19 ENCOUNTER — Other Ambulatory Visit: Payer: Self-pay | Admitting: Internal Medicine

## 2024-03-01 DIAGNOSIS — Z1231 Encounter for screening mammogram for malignant neoplasm of breast: Secondary | ICD-10-CM | POA: Diagnosis not present

## 2024-03-06 ENCOUNTER — Ambulatory Visit: Admitting: Internal Medicine

## 2024-03-06 ENCOUNTER — Encounter: Payer: Self-pay | Admitting: Internal Medicine

## 2024-03-06 VITALS — BP 130/88 | HR 84 | Temp 98.0°F | Ht 63.0 in | Wt 251.0 lb

## 2024-03-06 DIAGNOSIS — G47419 Narcolepsy without cataplexy: Secondary | ICD-10-CM

## 2024-03-06 DIAGNOSIS — M5440 Lumbago with sciatica, unspecified side: Secondary | ICD-10-CM | POA: Diagnosis not present

## 2024-03-06 DIAGNOSIS — F419 Anxiety disorder, unspecified: Secondary | ICD-10-CM

## 2024-03-06 DIAGNOSIS — G8929 Other chronic pain: Secondary | ICD-10-CM

## 2024-03-06 DIAGNOSIS — G9332 Myalgic encephalomyelitis/chronic fatigue syndrome: Secondary | ICD-10-CM

## 2024-03-06 DIAGNOSIS — E538 Deficiency of other specified B group vitamins: Secondary | ICD-10-CM | POA: Diagnosis not present

## 2024-03-06 DIAGNOSIS — H5203 Hypermetropia, bilateral: Secondary | ICD-10-CM | POA: Diagnosis not present

## 2024-03-06 DIAGNOSIS — F909 Attention-deficit hyperactivity disorder, unspecified type: Secondary | ICD-10-CM | POA: Diagnosis not present

## 2024-03-06 DIAGNOSIS — H524 Presbyopia: Secondary | ICD-10-CM | POA: Diagnosis not present

## 2024-03-06 MED ORDER — FUROSEMIDE 20 MG PO TABS: 20.0000 mg | ORAL_TABLET | Freq: Every day | ORAL | 3 refills | Status: DC

## 2024-03-06 MED ORDER — HYDROCODONE-ACETAMINOPHEN 7.5-325 MG PO TABS: 1.0000 | ORAL_TABLET | Freq: Three times a day (TID) | ORAL | 0 refills | Status: DC | PRN

## 2024-03-06 MED ORDER — AMPHETAMINE-DEXTROAMPHETAMINE 5 MG PO TABS: ORAL_TABLET | ORAL | 0 refills | Status: DC

## 2024-03-06 MED ORDER — IBUPROFEN 800 MG PO TABS: 800.0000 mg | ORAL_TABLET | Freq: Three times a day (TID) | ORAL | 0 refills | Status: DC | PRN

## 2024-03-06 MED ORDER — ALPRAZOLAM 0.5 MG PO TABS: ORAL_TABLET | ORAL | 1 refills | Status: DC

## 2024-03-06 MED ORDER — CYCLOBENZAPRINE HCL 10 MG PO TABS: 10.0000 mg | ORAL_TABLET | Freq: Every day | ORAL | 1 refills | Status: DC

## 2024-03-06 MED ORDER — ONDANSETRON HCL 4 MG PO TABS: 4.0000 mg | ORAL_TABLET | Freq: Three times a day (TID) | ORAL | 1 refills | Status: DC | PRN

## 2024-03-06 NOTE — Progress Notes (Signed)
 Subjective:  Patient ID: Tiffany Medina, female    DOB: 07/22/1965  Age: 58 y.o. MRN: [id number]  CC: Follow-up (Only concerns for edema) and Medication Refill   HPI Tiffany Medina presents for ADD, anxiety, CTD, LBP, edema C/o knee OA Wt has been around 250 lbs  Outpatient Medications Prior to Visit  Medication Sig Dispense Refill   albuterol  (PROVENTIL  HFA) 108 (90 Base) MCG/ACT inhaler Inhale 2 puffs into the lungs every 4 (four) hours as needed for wheezing or shortness of breath. 8.5 g 11   ASPIRIN  81 PO Take 2 tablets by mouth daily.     buPROPion  (WELLBUTRIN  SR) 200 MG 12 hr tablet Take 1 tablet (200 mg total) by mouth daily. 90 tablet 3   calcitRIOL  (ROCALTROL ) 0.5 MCG capsule Take 1 capsule (0.5 mcg total) by mouth in the morning and at bedtime. 60 capsule 11   cyanocobalamin  (VITAMIN B12) 1000 MCG/ML injection Inject 1 mL (1,000 mcg total) into the skin every 14 (fourteen) days. 6 mL 3   diphenoxylate -atropine  (LOMOTIL ) 2.5-0.025 MG tablet Take 1 tablet by mouth 4 (four) times daily as needed for diarrhea or loose stools. 60 tablet 1   estradiol (VIVELLE-DOT) 0.1 MG/24HR patch APPLY 1 PATCH TRANSDERMALLY TWICE A WEEK     levothyroxine  (SYNTHROID ) 100 MCG tablet Take 1 tablet (100 mcg total) by mouth daily before breakfast. TAKE 1/2 TABLET BY MOUTH ONCE DAILY BEFORE BREAKFAST (50mcg) 90 tablet 3   pantoprazole  (PROTONIX ) 40 MG tablet Take 1 tablet (40 mg total) by mouth 2 (two) times daily. 180 tablet 3   SYRINGE-NEEDLE, DISP, 3 ML (BD ECLIPSE SYRINGE) 25G X 1 3 ML MISC As directed 50 each 3   ALPRAZolam  (XANAX ) 0.5 MG tablet TAKE 3 TABLETS BY MOUTH EVERY DAY AT BEDTIME AS NEEDED 90 tablet 1   amphetamine -dextroamphetamine  (ADDERALL) 5 MG tablet 5 mg in am, 5 mg at lunch 60 tablet 0   amphetamine -dextroamphetamine  (ADDERALL) 5 MG tablet 5 mg in am, 5 mg at lunch 60 tablet 0   cyclobenzaprine  (FLEXERIL ) 10 MG tablet TAKE 1 TABLET(10 MG) BY MOUTH THREE TIMES DAILY AS NEEDED 90  tablet 1   furosemide  (LASIX ) 20 MG tablet Take 1 tablet (20 mg total) by mouth daily. 90 tablet 3   HYDROcodone -acetaminophen  (NORCO) 7.5-325 MG tablet Take 1 tablet by mouth every 8 (eight) hours as needed for severe pain (pain score 7-10). 90 tablet 0   HYDROcodone -acetaminophen  (NORCO) 7.5-325 MG tablet Take 1 tablet by mouth every 8 (eight) hours as needed for severe pain (pain score 7-10). 90 tablet 0   ibuprofen  (ADVIL ) 800 MG tablet TAKE 1 TABLET(800 MG) BY MOUTH EVERY 8 HOURS AS NEEDED 90 tablet 0   ondansetron  (ZOFRAN ) 4 MG tablet Take 1 tablet (4 mg total) by mouth every 8 (eight) hours as needed for nausea or vomiting. 20 tablet 1   clotrimazole -betamethasone  (LOTRISONE ) cream Apply 1 Application topically 2 (two) times daily. Use prn (Patient not taking: Reported on 03/06/2024) 60 g 0   No facility-administered medications prior to visit.    ROS: Review of Systems  Constitutional:  Positive for fatigue. Negative for activity change, appetite change, chills and unexpected weight change.  HENT:  Negative for congestion, mouth sores and sinus pressure.   Eyes:  Negative for visual disturbance.  Respiratory:  Negative for cough and chest tightness.   Cardiovascular:  Positive for leg swelling.  Gastrointestinal:  Negative for abdominal pain and nausea.  Genitourinary:  Negative for difficulty urinating, frequency  and vaginal pain.  Musculoskeletal:  Positive for arthralgias and back pain. Negative for gait problem.  Skin:  Positive for color change. Negative for pallor and rash.  Neurological:  Positive for headaches. Negative for dizziness, tremors, weakness and numbness.  Hematological:  Bruises/bleeds easily.  Psychiatric/Behavioral:  Positive for sleep disturbance. Negative for confusion and suicidal ideas. The patient is nervous/anxious.     Objective:  BP 130/88   Pulse 84   Temp 98 F (36.7 C)   Ht 5' 3 (1.6 m)   Wt 251 lb (113.9 kg)   SpO2 95%   BMI 44.46 kg/m    BP Readings from Last 3 Encounters:  03/06/24 130/88  01/03/24 122/82  12/21/23 132/88    Wt Readings from Last 3 Encounters:  03/06/24 251 lb (113.9 kg)  01/03/24 232 lb (105.2 kg)  12/21/23 254 lb (115.2 kg)    Physical Exam Constitutional:      General: She is not in acute distress.    Appearance: She is well-developed. She is obese.  HENT:     Head: Normocephalic.     Right Ear: External ear normal.     Left Ear: External ear normal.     Nose: Nose normal.  Eyes:     General:        Right eye: No discharge.        Left eye: No discharge.     Conjunctiva/sclera: Conjunctivae normal.     Pupils: Pupils are equal, round, and reactive to light.  Neck:     Thyroid : No thyromegaly.     Vascular: No JVD.     Trachea: No tracheal deviation.  Cardiovascular:     Rate and Rhythm: Normal rate and regular rhythm.     Heart sounds: Normal heart sounds.  Pulmonary:     Effort: No respiratory distress.     Breath sounds: No stridor. No wheezing.  Abdominal:     General: Bowel sounds are normal. There is no distension.     Palpations: Abdomen is soft. There is no mass.     Tenderness: There is no abdominal tenderness. There is no guarding or rebound.  Musculoskeletal:        General: Tenderness present.     Cervical back: Normal range of motion and neck supple. No rigidity.     Right lower leg: Edema present.     Left lower leg: Edema present.  Lymphadenopathy:     Cervical: No cervical adenopathy.  Skin:    Findings: No erythema or rash.  Neurological:     Mental Status: She is oriented to person, place, and time.     Cranial Nerves: No cranial nerve deficit.     Motor: No abnormal muscle tone.     Coordination: Coordination normal.     Deep Tendon Reflexes: Reflexes normal.  Psychiatric:        Behavior: Behavior normal.        Thought Content: Thought content normal.        Judgment: Judgment normal.   Trace edema B feet  Lab Results  Component Value Date    WBC 7.9 09/06/2023   HGB 15.7 (H) 09/06/2023   HCT 47.8 (H) 09/06/2023   PLT 411.0 (H) 09/06/2023   GLUCOSE 96 12/21/2023   CHOL 154 09/06/2023   TRIG 107.0 09/06/2023   HDL 52.80 09/06/2023   LDLCALC 79 09/06/2023   ALT 21 12/21/2023   AST 19 12/21/2023   NA 140 12/21/2023   K  4.5 12/21/2023   CL 100 12/21/2023   CREATININE 1.05 12/21/2023   BUN 25 (H) 12/21/2023   CO2 34 (H) 12/21/2023   TSH 0.60 12/21/2023   INR 1.02 11/28/2014   HGBA1C 6.2 12/21/2023    DG INJECT DIAG/THERA/INC NEEDLE/CATH/PLC EPI/LUMB/SAC W/IMG Result Date: 01/25/2023 CLINICAL DATA:  Low back pain radiating to the left leg all the way to the foot. FLUOROSCOPY: Radiation Exposure Index (as provided by the fluoroscopic device): 0 minutes 42 seconds. 70.58 micro gray meter squared PROCEDURE: The procedure, risks, benefits, and alternatives were explained to the patient. Questions regarding the procedure were encouraged and answered. The patient understands and consents to the procedure. LUMBAR EPIDURAL INJECTION: An interlaminar approach was performed on the left at L5-S1. The overlying skin was cleansed and anesthetized. A 20 gauge epidural needle was advanced using loss-of-resistance technique. DIAGNOSTIC EPIDURAL INJECTION: Injection of Isovue -M 200 shows a good epidural pattern with spread above and below the level of needle placement, primarily on the left no vascular opacification is seen. THERAPEUTIC EPIDURAL INJECTION: Eighty mg of Depo-Medrol  mixed with 2 cc 1% lidocaine were instilled. The procedure was well-tolerated, and the patient was discharged thirty minutes following the injection in good condition. COMPLICATIONS: None IMPRESSION: Technically successful L5-S1 left epidural steroid injection. Electronically Signed   By: Oneil Officer M.D.   On: 01/25/2023 15:02    Assessment & Plan:   Problem List Items Addressed This Visit     ADD (attention deficit disorder) - Primary   Rx Adderall - monitor HR   Potential benefits of a long term amphetamines  use as well as potential risks  and complications were explained to the patient and were aknowledged.      Anxiety disorder   Xanax  as needed  Potential benefits of a long term benzodiazepines  use as well as potential risks  and complications were explained to the patient and were aknowledged.      Relevant Medications   ALPRAZolam  (XANAX ) 0.5 MG tablet   B12 deficiency   On B12      CFS (chronic fatigue syndrome)   Stress reduction Rx Adderall - monitor HR  Potential benefits of a long term amphetamines  use as well as potential risks  and complications were explained to the patient and were aknowledged.      Low back pain    On Norco to 7.5/325 mg  Potential benefits of a long term opioids use as well as potential risks (i.e. addiction risk, apnea etc) and complications (i.e. Somnolence, constipation and others) were explained to the patient and were aknowledged.      Relevant Medications   HYDROcodone -acetaminophen  (NORCO) 7.5-325 MG tablet   cyclobenzaprine  (FLEXERIL ) 10 MG tablet   ibuprofen  (ADVIL ) 800 MG tablet   HYDROcodone -acetaminophen  (NORCO) 7.5-325 MG tablet   Narcolepsy   On Adderall  Potential benefits of a long term amphetamines  use as well as potential risks  and complications were explained to the patient and were aknowledged.          Meds ordered this encounter  Medications   ALPRAZolam  (XANAX ) 0.5 MG tablet    Sig: TAKE 3 TABLETS BY MOUTH EVERY DAY AT BEDTIME AS NEEDED    Dispense:  90 tablet    Refill:  1   amphetamine -dextroamphetamine  (ADDERALL) 5 MG tablet    Sig: 5 mg in am, 5 mg at lunch    Dispense:  60 tablet    Refill:  0    Please fill on  or after 04/05/24 Code: G47.419   HYDROcodone -acetaminophen  (NORCO) 7.5-325 MG tablet    Sig: Take 1 tablet by mouth every 8 (eight) hours as needed for severe pain (pain score 7-10).    Dispense:  90 tablet    Refill:  0    Please fill on or after  04/05/24 Code: M54.50   cyclobenzaprine  (FLEXERIL ) 10 MG tablet    Sig: Take 1 tablet (10 mg total) by mouth at bedtime.    Dispense:  90 tablet    Refill:  1   furosemide  (LASIX ) 20 MG tablet    Sig: Take 1 tablet (20 mg total) by mouth daily.    Dispense:  90 tablet    Refill:  3   ondansetron  (ZOFRAN ) 4 MG tablet    Sig: Take 1 tablet (4 mg total) by mouth every 8 (eight) hours as needed for nausea or vomiting.    Dispense:  20 tablet    Refill:  1   ibuprofen  (ADVIL ) 800 MG tablet    Sig: Take 1 tablet (800 mg total) by mouth every 8 (eight) hours as needed for moderate pain (pain score 4-6) or headache.    Dispense:  90 tablet    Refill:  0   amphetamine -dextroamphetamine  (ADDERALL) 5 MG tablet    Sig: 5 mg in am, 5 mg at lunch    Dispense:  60 tablet    Refill:  0    Please fill on or after 03/06/24 Code: G47.419   HYDROcodone -acetaminophen  (NORCO) 7.5-325 MG tablet    Sig: Take 1 tablet by mouth every 8 (eight) hours as needed for severe pain (pain score 7-10).    Dispense:  90 tablet    Refill:  0    Please fill on or after 03/06/24 Code: M54.50      Follow-up: Return in about 2 months (around 05/06/2024) for a follow-up visit.  Marolyn Noel, MD

## 2024-03-06 NOTE — Assessment & Plan Note (Signed)
  On Norco to 7.5/325 mg  Potential benefits of a long term opioids use as well as potential risks (i.e. addiction risk, apnea etc) and complications (i.e. Somnolence, constipation and others) were explained to the patient and were aknowledged.

## 2024-03-06 NOTE — Assessment & Plan Note (Signed)
Xanax as needed  Potential benefits of a long term benzodiazepines  use as well as potential risks  and complications were explained to the patient and were aknowledged.

## 2024-03-06 NOTE — Assessment & Plan Note (Signed)
 On B12

## 2024-03-06 NOTE — Assessment & Plan Note (Signed)
 Stress reduction Rx Adderall - monitor HR  Potential benefits of a long term amphetamines  use as well as potential risks  and complications were explained to the patient and were aknowledged.

## 2024-03-06 NOTE — Assessment & Plan Note (Signed)
 On Adderall  Potential benefits of a long term amphetamines  use as well as potential risks  and complications were explained to the patient and were aknowledged.

## 2024-03-06 NOTE — Assessment & Plan Note (Signed)
 Rx Adderall - monitor HR  Potential benefits of a long term amphetamines  use as well as potential risks  and complications were explained to the patient and were aknowledged.

## 2024-03-19 ENCOUNTER — Other Ambulatory Visit: Payer: Self-pay | Admitting: Internal Medicine

## 2024-03-19 DIAGNOSIS — R233 Spontaneous ecchymoses: Secondary | ICD-10-CM | POA: Diagnosis not present

## 2024-03-19 DIAGNOSIS — L931 Subacute cutaneous lupus erythematosus: Secondary | ICD-10-CM | POA: Diagnosis not present

## 2024-03-22 ENCOUNTER — Other Ambulatory Visit: Payer: Self-pay | Admitting: Internal Medicine

## 2024-03-26 ENCOUNTER — Encounter: Payer: Self-pay | Admitting: Internal Medicine

## 2024-03-30 DIAGNOSIS — L299 Pruritus, unspecified: Secondary | ICD-10-CM | POA: Diagnosis not present

## 2024-03-30 DIAGNOSIS — L931 Subacute cutaneous lupus erythematosus: Secondary | ICD-10-CM | POA: Diagnosis not present

## 2024-03-30 DIAGNOSIS — L209 Atopic dermatitis, unspecified: Secondary | ICD-10-CM | POA: Diagnosis not present

## 2024-04-04 ENCOUNTER — Ambulatory Visit (INDEPENDENT_AMBULATORY_CARE_PROVIDER_SITE_OTHER): Admitting: Family Medicine

## 2024-04-04 ENCOUNTER — Telehealth: Payer: Self-pay

## 2024-04-04 ENCOUNTER — Encounter: Payer: Self-pay | Admitting: Family Medicine

## 2024-04-04 VITALS — BP 120/86 | HR 88 | Ht 63.0 in | Wt 246.0 lb

## 2024-04-04 DIAGNOSIS — M170 Bilateral primary osteoarthritis of knee: Secondary | ICD-10-CM | POA: Diagnosis not present

## 2024-04-04 DIAGNOSIS — M25562 Pain in left knee: Secondary | ICD-10-CM | POA: Diagnosis not present

## 2024-04-04 DIAGNOSIS — G8929 Other chronic pain: Secondary | ICD-10-CM

## 2024-04-04 DIAGNOSIS — M25561 Pain in right knee: Secondary | ICD-10-CM

## 2024-04-04 NOTE — Patient Instructions (Addendum)
 Thank you for coming in today.   We are checking benefits for Zilretta  for both knees.
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
## 2024-04-04 NOTE — Telephone Encounter (Signed)
 Please check insurance benefits and prior authorization requirements for Zilretta  for BILAT knee OA.   Last Zilretta  inj LEFT 10/26/23

## 2024-04-04 NOTE — Telephone Encounter (Signed)
 Patient re-run for Zilretta  on 04/04/2024. Case ID: [id number]. Pending approval.

## 2024-04-05 NOTE — Telephone Encounter (Signed)
 Holding until medication has arrived.

## 2024-04-05 NOTE — Telephone Encounter (Signed)
 Can you schedule patient when medication is stocked thank you   Zilretta  authorized for left knee NO PRE CERT REQUIRED Coinsurance 20% Deductible does not apply OOP Max $3900 has met $40 Copay $20 Once OOP has been met coverage goes to 100% and copay will no longer apply Reference number [id number]

## 2024-04-09 ENCOUNTER — Telehealth: Payer: Self-pay

## 2024-04-09 DIAGNOSIS — G47419 Narcolepsy without cataplexy: Secondary | ICD-10-CM

## 2024-04-09 NOTE — Telephone Encounter (Signed)
 Patient was needing Bilateral knees but looks like only Left was approved?

## 2024-04-09 NOTE — Telephone Encounter (Signed)
 Found the issue. The diagnosis code was entered in wrong. Resent it for the right knee only. Case ID [id number]. Pending approval. You can disregard what I printed.

## 2024-04-09 NOTE — Telephone Encounter (Signed)
 Copied from CRM [id number]. Topic: Referral - Request for Referral >> Apr 09, 2024 11:24 AM Delon DASEN wrote: Did the patient discuss referral with their provider in the last year? Yes (If No - schedule appointment) (If Yes - send message)  Appointment offered? Yes  Type of order/referral and detailed reason for visit: Neurologist- sleep apnea  Preference of office, provider, location: Dr Buck  If referral order, have you been seen by this specialty before? Yes (If Yes, this issue or another issue? When? Where?  Can we respond through MyChart? Yes

## 2024-04-10 NOTE — Telephone Encounter (Signed)
Scheduled. 10/10

## 2024-04-10 NOTE — Telephone Encounter (Signed)
 Patient will need an appointment once medication is in stock for bilateral knees.  Zilretta  approved for right knee.  No prior authorization, medical notes or referrals needed. Patient has a F[employer] advantage HMO plan with an effective date of 01/03/2024. Plan follows Medicare guidelines. Patient responsibility for [phone] (Zilretta ) will be 20% with the remaining covered at 80% by the payer at the contracted rate. Patient is responsible for a $20 copay for CPT code 79388 with the remaining covered at 100% by the payer at contracted rate. Deductibles do not apply to these services. Patient has a $20 copay whether or not an office visit is billed. Only one copay applies per date of service. Patient has an out of pocket maximum of $3900 and has accumulated $40. If out of pocket is met, coverage goes to 100% and copays will no longer apply. The above benefits represent the coverage for J3304 (Zilretta ) based on repeat administration. Patient has a calendar year policy starting from July 06, 2023 to July 04, 2024.  Ref#: [id number] Exp: 06/25/2024

## 2024-04-13 ENCOUNTER — Ambulatory Visit: Admitting: Family Medicine

## 2024-04-13 ENCOUNTER — Other Ambulatory Visit: Payer: Self-pay

## 2024-04-13 ENCOUNTER — Telehealth: Payer: Self-pay | Admitting: Family Medicine

## 2024-04-13 DIAGNOSIS — M170 Bilateral primary osteoarthritis of knee: Secondary | ICD-10-CM | POA: Diagnosis not present

## 2024-04-13 MED ORDER — TRIAMCINOLONE ACETONIDE 32 MG IX SRER
32.0000 mg | Freq: Once | INTRA_ARTICULAR | Status: AC
  Administered 2024-04-13: 32 mg via INTRA_ARTICULAR

## 2024-04-13 NOTE — Patient Instructions (Signed)
Thank you for coming in today.   Call or go to the ER if you develop a large red swollen joint with extreme pain or oozing puss.   

## 2024-04-13 NOTE — Telephone Encounter (Signed)
 Okay. Thank you.

## 2024-04-13 NOTE — Progress Notes (Signed)
  Zilretta  injection bilateral knee Procedure: Real-time Ultrasound Guided Injection of right knee joint superior lateral patellar space Device: Philips Affiniti 50G Images permanently stored and available for review in PACS Verbal informed consent obtained.  Discussed risks and benefits of procedure. Warned about infection, hyperglycemia bleeding, damage to structures among others. Patient expresses understanding and agreement Time-out conducted.   Noted no overlying erythema, induration, or other signs of local infection.   Skin prepped in a sterile fashion.   Local anesthesia: Topical Ethyl chloride.   With sterile technique and under real time ultrasound guidance: Zilretta  32 mg injected into knee joint. Fluid seen entering the joint capsule.   Completed without difficulty   Advised to call if fevers/chills, erythema, induration, drainage, or persistent bleeding.   Images permanently stored and available for review in the ultrasound unit.  Impression: Technically successful ultrasound guided injection.   Procedure: Real-time Ultrasound Guided Injection of left knee joint superior lateral patellar space Device: Philips Affiniti 50G Images permanently stored and available for review in PACS Verbal informed consent obtained.  Discussed risks and benefits of procedure. Warned about infection, hyperglycemia bleeding, damage to structures among others. Patient expresses understanding and agreement Time-out conducted.   Noted no overlying erythema, induration, or other signs of local infection.   Skin prepped in a sterile fashion.   Local anesthesia: Topical Ethyl chloride.   With sterile technique and under real time ultrasound guidance: Zilretta  32 mg injected into knee joint. Fluid seen entering the joint capsule.   Completed without difficulty   Advised to call if fevers/chills, erythema, induration, drainage, or persistent bleeding.   Images permanently stored and available for review  in the ultrasound unit.  Impression: Technically successful ultrasound guided injection.  Lot number: [id number]

## 2024-04-13 NOTE — Telephone Encounter (Signed)
 Pt had bil Zilretta  this morning. She has noted increased low pain back since this morning, not sure if this has to do with the injections but her back is not normally this bad.  Has taken pain meds, ibuprofen  and is using heat.

## 2024-04-16 NOTE — Telephone Encounter (Signed)
 Forwarding to Dr. Denyse Amass to review and advise.

## 2024-04-17 NOTE — Telephone Encounter (Signed)
 Called and spoke with patient. She notes that sx have improved with medication and heat, feeling pretty good today, overall.

## 2024-04-17 NOTE — Telephone Encounter (Signed)
 Is the back pain feeling better now?

## 2024-04-24 ENCOUNTER — Ambulatory Visit

## 2024-04-24 ENCOUNTER — Other Ambulatory Visit: Payer: Self-pay

## 2024-04-24 ENCOUNTER — Telehealth: Payer: Self-pay

## 2024-04-24 ENCOUNTER — Ambulatory Visit (INDEPENDENT_AMBULATORY_CARE_PROVIDER_SITE_OTHER): Admitting: Family Medicine

## 2024-04-24 VITALS — BP 138/80 | HR 94 | Ht 63.0 in | Wt 248.0 lb

## 2024-04-24 DIAGNOSIS — M79605 Pain in left leg: Secondary | ICD-10-CM

## 2024-04-24 DIAGNOSIS — M25532 Pain in left wrist: Secondary | ICD-10-CM

## 2024-04-24 NOTE — Progress Notes (Signed)
   I, Claretha Schimke am a scribe for Dr. Artist Lloyd, MD.  Tiffany Medina is a 58 y.o. female who presents to [facility] at [facility] today for L LE pain/swelling. Pt was last seen by Dr. Lloyd on 04/13/24 for bilat Zilretta  injections.  Today, pt called the office stating she suffered a fall earlier today. Coming home from the grocery store, she mis-stepped on the last step and fell on her L side. She c/o pain in the L knee and lower leg pain, w/ swelling present. Pt notes hx of blood clot and fear of re-occurrence. Patient states that left hand hurts and the wrist hurts as well because she tried to catch herself.   Pertinent review of systems: No fevers or chills  Relevant historical information: History of a DVT   Exam:  BP 138/80   Pulse 94   Ht 5' 3 (1.6 m)   Wt 248 lb (112.5 kg)   SpO2 96%   BMI 43.93 kg/m  General: Well Developed, well nourished, and in no acute distress.   MSK: Left leg: Multiple varicosities present bilateral lower extremities.  Bruising present left anterior proximal tibia.  Mildly tender to palpation.  No significant ankle swelling or edema left compared to right.  Left wrist: Some wrist effusion is present.  Tender palpation around ulnar styloid.    Lab and Radiology Results  Diagnostic Limited MSK Ultrasound of: Left leg anterior lateral tibia bruising No large hematoma is visible.  Trace hypoechoic change present superficial area of tenderness represents a small hematoma.  Tender to palpation. Impression: Small hematoma.   X-ray images left wrist obtained today personally and independently interpreted. No large displaced fracture is visible.  There is a tiny lucency near the ulnar styloid that could represent a nondisplaced fracture. Degenerative changes present throughout the wrist. Await formal radiology review.  Assessment and Plan: 58 y.o. female with left leg pain and swelling due to small hematoma occurring after a  fall.  Plan for compression.  If patient has worsening leg swelling happy to organize a vascular ultrasound to assess for DVT.  She will let me know.  Left wrist pain and swelling.  There is some concern for small nondisplaced fracture.  Plan for thumb spica wrist brace and recheck in 10 to 14 days.   PDMP not reviewed this encounter. Orders Placed This Encounter  Procedures   US  LIMITED JOINT SPACE STRUCTURES LOW LEFT(NO LINKED CHARGES)    Reason for Exam (SYMPTOM  OR DIAGNOSIS REQUIRED):   leg pain    Preferred imaging location?:   [facility] Wrist Complete Left    Standing Status:   Future    Number of Occurrences:   1    Expiration Date:   05/25/2024    Reason for Exam (SYMPTOM  OR DIAGNOSIS REQUIRED):   left wrist pain    Preferred imaging location?:   [place] [facility]    Is patient pregnant?:   No   No orders of the defined types were placed in this encounter.    Discussed warning signs or symptoms. Please see discharge instructions. Patient expresses understanding.   The above documentation has been reviewed and is accurate and complete Artist Lloyd, M.D.

## 2024-04-24 NOTE — Patient Instructions (Addendum)
 Thank you for coming in today.   Please use Voltaren  gel (Generic Diclofenac  Gel) up to 4x daily for pain as needed.  This is available over-the-counter as both the name brand Voltaren  gel and the generic diclofenac  gel.   Please get an Xray today before you leave   Let me know if the leg swelling worsens and I will order a vascular ultrasound

## 2024-04-24 NOTE — Telephone Encounter (Signed)
 Pt called stating the she suffered a fall, mis-stepping on the last step and falling to her L side. She c/o pain in the L knee and lower leg, w/ swelling present. Pt notes hx of blood clot and fear of re-occurrence. Pt really didn't want to have to go to the ED/UC. Adjusted schedule to see pt later this afternoon. Pt verbalized understanding and appreciation.

## 2024-04-27 ENCOUNTER — Encounter: Payer: Self-pay | Admitting: Family Medicine

## 2024-04-27 ENCOUNTER — Ambulatory Visit: Payer: Self-pay | Admitting: Family Medicine

## 2024-04-27 NOTE — Progress Notes (Signed)
 Left wrist x-ray shows old injuries to the wrist including arthritis and possible failure of the bone in the wrist and tear of the ligament in the wrist.  Fortunately no broken bones are present.

## 2024-05-06 ENCOUNTER — Other Ambulatory Visit: Payer: Self-pay | Admitting: Internal Medicine

## 2024-05-07 NOTE — Progress Notes (Unsigned)
   LILLETTE Ileana Collet, PhD, LAT, ATC acting as a scribe for Artist Lloyd, MD.  Tiffany Medina is a 58 y.o. female who presents to [facility] at [facility] today for f/u L lower leg and L wrist pain. Pt was last seen by Dr. Lloyd on 04/24/24 and was placed in a thumb spica wrist brace and let us  know if she wanted us  to order a vasc US .  Today, pt reports ***  Dx testing: 04/24/24 L wrist XR  Pertinent review of systems: ***  Relevant historical information: ***   Exam:  There were no vitals taken for this visit. General: Well Developed, well nourished, and in no acute distress.   MSK: ***    Lab and Radiology Results No results found for this or any previous visit (from the past 72 hours). No results found.     Assessment and Plan: 58 y.o. female with ***   PDMP not reviewed this encounter. No orders of the defined types were placed in this encounter.  No orders of the defined types were placed in this encounter.    Discussed warning signs or symptoms. Please see discharge instructions. Patient expresses understanding.   ***

## 2024-05-08 ENCOUNTER — Ambulatory Visit: Admitting: Family Medicine

## 2024-05-08 ENCOUNTER — Encounter: Payer: Self-pay | Admitting: Internal Medicine

## 2024-05-08 ENCOUNTER — Ambulatory Visit: Admitting: Internal Medicine

## 2024-05-08 ENCOUNTER — Encounter: Payer: Self-pay | Admitting: Family Medicine

## 2024-05-08 VITALS — BP 144/100 | HR 82 | Ht 63.0 in | Wt 247.0 lb

## 2024-05-08 DIAGNOSIS — Z6841 Body Mass Index (BMI) 40.0 and over, adult: Secondary | ICD-10-CM

## 2024-05-08 DIAGNOSIS — F419 Anxiety disorder, unspecified: Secondary | ICD-10-CM | POA: Diagnosis not present

## 2024-05-08 DIAGNOSIS — E038 Other specified hypothyroidism: Secondary | ICD-10-CM | POA: Diagnosis not present

## 2024-05-08 DIAGNOSIS — F909 Attention-deficit hyperactivity disorder, unspecified type: Secondary | ICD-10-CM | POA: Diagnosis not present

## 2024-05-08 DIAGNOSIS — S60211D Contusion of right wrist, subsequent encounter: Secondary | ICD-10-CM | POA: Diagnosis not present

## 2024-05-08 DIAGNOSIS — M92212 Osteochondrosis (juvenile) of carpal lunate [Kienbock], left hand: Secondary | ICD-10-CM | POA: Diagnosis not present

## 2024-05-08 DIAGNOSIS — G8929 Other chronic pain: Secondary | ICD-10-CM

## 2024-05-08 DIAGNOSIS — R739 Hyperglycemia, unspecified: Secondary | ICD-10-CM

## 2024-05-08 DIAGNOSIS — M5440 Lumbago with sciatica, unspecified side: Secondary | ICD-10-CM

## 2024-05-08 DIAGNOSIS — M25532 Pain in left wrist: Secondary | ICD-10-CM

## 2024-05-08 DIAGNOSIS — S60219A Contusion of unspecified wrist, initial encounter: Secondary | ICD-10-CM | POA: Insufficient documentation

## 2024-05-08 DIAGNOSIS — E538 Deficiency of other specified B group vitamins: Secondary | ICD-10-CM | POA: Diagnosis not present

## 2024-05-08 LAB — COMPREHENSIVE METABOLIC PANEL WITH GFR
ALT: 21 U/L (ref 0–35)
AST: 19 U/L (ref 0–37)
Albumin: 4.5 g/dL (ref 3.5–5.2)
Alkaline Phosphatase: 102 U/L (ref 39–117)
BUN: 23 mg/dL (ref 6–23)
CO2: 30 meq/L (ref 19–32)
Calcium: 9.6 mg/dL (ref 8.4–10.5)
Chloride: 99 meq/L (ref 96–112)
Creatinine, Ser: 1.17 mg/dL (ref 0.40–1.20)
GFR: 51.07 mL/min — ABNORMAL LOW (ref >60.00)
Glucose, Bld: 88 mg/dL (ref 70–99)
Potassium: 4.5 meq/L (ref 3.5–5.1)
Sodium: 138 meq/L (ref 135–145)
Total Bilirubin: 0.3 mg/dL (ref 0.2–1.2)
Total Protein: 7.8 g/dL (ref 6.0–8.3)

## 2024-05-08 LAB — HEMOGLOBIN A1C: Hgb A1c MFr Bld: 6.4 % (ref 4.6–6.5)

## 2024-05-08 MED ORDER — PROMETHAZINE HCL 25 MG PO TABS: 25.0000 mg | ORAL_TABLET | Freq: Four times a day (QID) | ORAL | 1 refills | Status: AC | PRN

## 2024-05-08 MED ORDER — AMPHETAMINE-DEXTROAMPHETAMINE 5 MG PO TABS: ORAL_TABLET | ORAL | 0 refills | Status: DC

## 2024-05-08 MED ORDER — ALPRAZOLAM 0.5 MG PO TABS: ORAL_TABLET | ORAL | 1 refills | Status: DC

## 2024-05-08 MED ORDER — HYDROCODONE-ACETAMINOPHEN 7.5-325 MG PO TABS: 1.0000 | ORAL_TABLET | Freq: Three times a day (TID) | ORAL | 0 refills | Status: DC | PRN

## 2024-05-08 NOTE — Assessment & Plan Note (Signed)
 On B12

## 2024-05-08 NOTE — Assessment & Plan Note (Signed)
 Rx Adderall - monitor HR  Potential benefits of a long term amphetamines  use as well as potential risks  and complications were explained to the patient and were aknowledged.

## 2024-05-08 NOTE — Assessment & Plan Note (Signed)
Chronic  Cont on Levothroid 100 mcg/d

## 2024-05-08 NOTE — Assessment & Plan Note (Signed)
  On Norco to 7.5/325 mg  Potential benefits of a long term opioids use as well as potential risks (i.e. addiction risk, apnea etc) and complications (i.e. Somnolence, constipation and others) were explained to the patient and were aknowledged.

## 2024-05-08 NOTE — Assessment & Plan Note (Signed)
Xanax as needed  Potential benefits of a long term benzodiazepines  use as well as potential risks  and complications were explained to the patient and were aknowledged.

## 2024-05-08 NOTE — Assessment & Plan Note (Signed)
Pt lost 40 lbs on diet.

## 2024-05-08 NOTE — Assessment & Plan Note (Signed)
 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

## 2024-05-08 NOTE — Patient Instructions (Signed)
 Thank you for coming in today.   I've referred you to Dr. Erwin let us  know if you don't hear from them in one week.   Check back with me as needed

## 2024-05-08 NOTE — Progress Notes (Signed)
 Subjective:  Patient ID: Tiffany Medina, female    DOB: 1966/04/12  Age: 58 y.o. 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  Erminio Nigh, her ex mother-in-law died in 05/05/24-grieving  Outpatient Medications Prior to Visit  Medication Sig Dispense Refill   albuterol  (PROVENTIL  HFA) 108 (90 Base) MCG/ACT inhaler Inhale 2 puffs into the lungs every 4 (four) hours as needed for wheezing or shortness of breath. 8.5 g 11   ASPIRIN  81 PO Take 2 tablets by mouth daily.     buPROPion  (WELLBUTRIN  SR) 200 MG 12 hr tablet Take 1 tablet (200 mg total) by mouth daily. 90 tablet 3   calcitRIOL  (ROCALTROL ) 0.5 MCG capsule TAKE 1 CAPSULE(0.5 MCG) BY MOUTH IN THE MORNING AND AT BEDTIME 60 capsule 11   cyanocobalamin  (VITAMIN B12) 1000 MCG/ML injection Inject 1 mL (1,000 mcg total) into the skin every 14 (fourteen) days. 6 mL 3   cyclobenzaprine  (FLEXERIL ) 10 MG tablet TAKE 1 TABLET(10 MG) BY MOUTH THREE TIMES DAILY AS NEEDED 90 tablet 1   diphenoxylate -atropine  (LOMOTIL ) 2.5-0.025 MG tablet Take 1 tablet by mouth 4 (four) times daily as needed for diarrhea or loose stools. 60 tablet 1   estradiol (VIVELLE-DOT) 0.1 MG/24HR patch APPLY 1 PATCH TRANSDERMALLY TWICE A WEEK     furosemide  (LASIX ) 20 MG tablet Take 1 tablet (20 mg total) by mouth daily. 90 tablet 3   ibuprofen  (ADVIL ) 800 MG tablet TAKE 1 TABLET(800 MG) BY MOUTH EVERY 8 HOURS AS NEEDED 90 tablet 0   levothyroxine  (SYNTHROID ) 100 MCG tablet Take 1 tablet (100 mcg total) by mouth daily before breakfast. TAKE 1/2 TABLET BY MOUTH ONCE DAILY BEFORE BREAKFAST (50mcg) 90 tablet 3   pantoprazole  (PROTONIX ) 40 MG tablet Take 1 tablet (40 mg total) by mouth 2 (two) times daily. 180 tablet 3   SYRINGE-NEEDLE, DISP, 3 ML (BD ECLIPSE SYRINGE) 25G X 1 3 ML MISC As directed 50 each 3   ALPRAZolam  (XANAX ) 0.5 MG tablet TAKE 3  TABLETS BY MOUTH EVERY DAY AT BEDTIME AS NEEDED 90 tablet 1   amphetamine -dextroamphetamine  (ADDERALL) 5 MG tablet 5 mg in am, 5 mg at lunch 60 tablet 0   amphetamine -dextroamphetamine  (ADDERALL) 5 MG tablet 5 mg in am, 5 mg at lunch 60 tablet 0   HYDROcodone -acetaminophen  (NORCO) 7.5-325 MG tablet Take 1 tablet by mouth every 8 (eight) hours as needed for severe pain (pain score 7-10). 90 tablet 0   HYDROcodone -acetaminophen  (NORCO) 7.5-325 MG tablet Take 1 tablet by mouth every 8 (eight) hours as needed for severe pain (pain score 7-10). 90 tablet 0   ondansetron  (ZOFRAN ) 4 MG tablet Take 1 tablet (4 mg total) by mouth every 8 (eight) hours as needed for nausea or vomiting. 20 tablet 1   clotrimazole -betamethasone  (LOTRISONE ) cream Apply 1 Application topically 2 (two) times daily. Use prn (Patient not taking: Reported on 05/08/2024) 60 g 0   No facility-administered medications prior to visit.    ROS: Review of Systems  Constitutional:  Positive for fatigue. Negative for activity change, appetite change, chills and unexpected weight change.  HENT:  Negative for congestion, mouth sores and sinus pressure.   Eyes:  Negative for visual disturbance.  Respiratory:  Negative for cough and chest tightness.   Cardiovascular:  Positive for leg swelling.  Gastrointestinal:  Negative for abdominal pain and  nausea.  Genitourinary:  Negative for difficulty urinating, frequency and vaginal pain.  Musculoskeletal:  Positive for arthralgias, back pain and gait problem.  Skin:  Negative for pallor and rash.  Neurological:  Negative for dizziness, tremors, weakness, numbness and headaches.  Psychiatric/Behavioral:  Positive for decreased concentration and sleep disturbance. Negative for confusion and suicidal ideas. The patient is nervous/anxious.     Objective:  BP (!) 144/100   Pulse 82   Ht 5' 3 (1.6 m)   Wt 247 lb (112 kg)   SpO2 98%   BMI 43.75 kg/m   BP Readings from Last 3 Encounters:   05/08/24 (!) 144/100  05/08/24 (!) 144/100  04/24/24 138/80    Wt Readings from Last 3 Encounters:  05/08/24 247 lb (112 kg)  05/08/24 247 lb (112 kg)  04/24/24 248 lb (112.5 kg)    Physical Exam Constitutional:      General: She is not in acute distress.    Appearance: She is well-developed. She is obese.  HENT:     Head: Normocephalic.     Right Ear: External ear normal.     Left Ear: External ear normal.     Nose: Nose normal.  Eyes:     General:        Right eye: No discharge.        Left eye: No discharge.     Conjunctiva/sclera: Conjunctivae normal.     Pupils: Pupils are equal, round, and reactive to light.  Neck:     Thyroid : No thyromegaly.     Vascular: No JVD.     Trachea: No tracheal deviation.  Cardiovascular:     Rate and Rhythm: Normal rate and regular rhythm.     Heart sounds: Normal heart sounds.  Pulmonary:     Effort: No respiratory distress.     Breath sounds: No stridor. No wheezing.  Abdominal:     General: Bowel sounds are normal. There is no distension.     Palpations: Abdomen is soft. There is no mass.     Tenderness: There is no abdominal tenderness. There is no guarding or rebound.  Musculoskeletal:        General: Tenderness present.     Cervical back: Normal range of motion and neck supple. No rigidity.     Right lower leg: No edema.     Left lower leg: No edema.  Lymphadenopathy:     Cervical: No cervical adenopathy.  Skin:    Findings: No erythema or rash.  Neurological:     Mental Status: She is oriented to person, place, and time.     Cranial Nerves: No cranial nerve deficit.     Motor: No abnormal muscle tone.     Coordination: Coordination normal.     Gait: Gait abnormal.     Deep Tendon Reflexes: Reflexes normal.  Psychiatric:        Behavior: Behavior normal.        Thought Content: Thought content normal.        Judgment: Judgment normal.     Lab Results  Component Value Date   WBC 7.9 09/06/2023   HGB 15.7 (H)  09/06/2023   HCT 47.8 (H) 09/06/2023   PLT 411.0 (H) 09/06/2023   GLUCOSE 88 05/08/2024   CHOL 154 09/06/2023   TRIG 107.0 09/06/2023   HDL 52.80 09/06/2023   LDLCALC 79 09/06/2023   ALT 21 05/08/2024   AST 19 05/08/2024   NA 138 05/08/2024   K 4.5  05/08/2024   CL 99 05/08/2024   CREATININE 1.17 05/08/2024   BUN 23 05/08/2024   CO2 30 05/08/2024   TSH 0.60 12/21/2023   INR 1.02 11/28/2014   HGBA1C 6.4 05/08/2024    DG INJECT DIAG/THERA/INC NEEDLE/CATH/PLC EPI/LUMB/SAC W/IMG Result Date: 01/25/2023 CLINICAL DATA:  Low back pain radiating to the left leg all the way to the foot. FLUOROSCOPY: Radiation Exposure Index (as provided by the fluoroscopic device): 0 minutes 42 seconds. 70.58 micro gray meter squared PROCEDURE: The procedure, risks, benefits, and alternatives were explained to the patient. Questions regarding the procedure were encouraged and answered. The patient understands and consents to the procedure. LUMBAR EPIDURAL INJECTION: An interlaminar approach was performed on the left at L5-S1. The overlying skin was cleansed and anesthetized. A 20 gauge epidural needle was advanced using loss-of-resistance technique. DIAGNOSTIC EPIDURAL INJECTION: Injection of Isovue -M 200 shows a good epidural pattern with spread above and below the level of needle placement, primarily on the left no vascular opacification is seen. THERAPEUTIC EPIDURAL INJECTION: Eighty mg of Depo-Medrol  mixed with 2 cc 1% lidocaine were instilled. The procedure was well-tolerated, and the patient was discharged thirty minutes following the injection in good condition. COMPLICATIONS: None IMPRESSION: Technically successful L5-S1 left epidural steroid injection. Electronically Signed   By: Oneil Officer M.D.   On: 01/25/2023 15:02    Assessment & Plan:   Problem List Items Addressed This Visit     ADD (attention deficit disorder) - Primary   Rx Adderall - monitor HR  Potential benefits of a long term amphetamines   use as well as potential risks  and complications were explained to the patient and were aknowledged.      Anxiety disorder   Xanax  as needed  Potential benefits of a long term benzodiazepines  use as well as potential risks  and complications were explained to the patient and were aknowledged.      Relevant Medications   ALPRAZolam  (XANAX ) 0.5 MG tablet   B12 deficiency   On B12      BMI 45.0-49.9, adult (HCC)   Pt lost 40 lbs on diet!      Relevant Medications   amphetamine -dextroamphetamine  (ADDERALL) 5 MG tablet   amphetamine -dextroamphetamine  (ADDERALL) 5 MG tablet   Hyperglycemia   Relevant Orders   Hemoglobin A1c (Completed)   Hypothyroidism   Chronic  Cont on Levothroid 100 mcg/d      Low back pain    On Norco to 7.5/325 mg  Potential benefits of a long term opioids use as well as potential risks (i.e. addiction risk, apnea etc) and complications (i.e. Somnolence, constipation and others) were explained to the patient and were aknowledged.      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       Relevant Orders   Comprehensive metabolic panel with GFR (Completed)      Meds ordered this encounter  Medications   promethazine  (PHENERGAN ) 25 MG tablet    Sig: Take 1 tablet (25 mg total) by mouth every 6 (six) hours as needed for up to 7 days for nausea or vomiting.    Dispense:  60 tablet    Refill:  1   ALPRAZolam  (XANAX ) 0.5 MG tablet    Sig: TAKE 3 TABLETS BY MOUTH EVERY DAY AT BEDTIME AS NEEDED    Dispense:  90 tablet  Refill:  1   amphetamine -dextroamphetamine  (ADDERALL) 5 MG tablet    Sig: 5 mg in am, 5 mg at lunch    Dispense:  60 tablet    Refill:  0    Please fill on or after 05/06/24 Code: G47.419   amphetamine -dextroamphetamine  (ADDERALL) 5 MG tablet    Sig: 5 mg in am, 5 mg at lunch    Dispense:  60  tablet    Refill:  0    Please fill on or after 06/05/24 Code: G47.419   HYDROcodone -acetaminophen  (NORCO) 7.5-325 MG tablet    Sig: Take 1 tablet by mouth every 8 (eight) hours as needed for severe pain (pain score 7-10).    Dispense:  90 tablet    Refill:  0    Please fill on or after 05/06/24 Code: M54.50   HYDROcodone -acetaminophen  (NORCO) 7.5-325 MG tablet    Sig: Take 1 tablet by mouth every 8 (eight) hours as needed for severe pain (pain score 7-10).    Dispense:  90 tablet    Refill:  0    Please fill on or after 06/05/24 Code: M54.50      Follow-up: Return in about 2 months (around 07/08/2024) for a follow-up visit.  Marolyn Noel, MD

## 2024-05-09 ENCOUNTER — Ambulatory Visit: Payer: Self-pay | Admitting: Internal Medicine

## 2024-05-10 LAB — PMP SCREEN PROFILE (10S), URINE
Amphetamine Scrn, Ur: POSITIVE ng/mL — AB
BARBITURATE SCREEN URINE: NEGATIVE ng/mL
BENZODIAZEPINE SCREEN, URINE: POSITIVE ng/mL — AB
CANNABINOIDS UR QL SCN: NEGATIVE ng/mL
Cocaine (Metab) Scrn, Ur: NEGATIVE ng/mL
Creatinine(Crt), U: 41.1 mg/dL (ref 20.0–300.0)
Methadone Screen, Urine: NEGATIVE ng/mL
OXYCODONE+OXYMORPHONE UR QL SCN: NEGATIVE ng/mL
Opiate Scrn, Ur: POSITIVE ng/mL — AB
Ph of Urine: 5.9 (ref 4.5–8.9)
Phencyclidine Qn, Ur: NEGATIVE ng/mL
Propoxyphene Scrn, Ur: NEGATIVE ng/mL

## 2024-05-15 NOTE — Progress Notes (Unsigned)
 Tiffany Medina - 58 y.o. female MRN [id number]  Date of birth: 1966/07/03  Office Visit Note: Visit Date: 05/16/2024 PCP: Garald Karlynn GAILS, MD Referred by: Joane Artist RAMAN, MD  Subjective: No chief complaint on file.  HPI: Tiffany Medina is a pleasant 58 y.o. female who presents today for ongoing left wrist pain with associated numbness and tingling that has been present now for multiple months.  Has not undergone any significant treatment modalities.  Has trialed some over-the-counter bracing of the right wrist with mild relief.  She does smoke approximately a pack per day.  She is diabetic, recent A1c 6.4.  Pertinent ROS were reviewed with the patient and found to be negative unless otherwise specified above in HPI.   Visit Reason: left wrist Duration of symptoms: October 2025 Hand dominance: right Occupation: Disabled Diabetic: Yes/ 6.4 Smoking: Yes Heart/Lung History: asthma Blood Thinners:  Aspirin   Prior Testing/EMG: xrays Injections (Date): none Treatments: brace Prior Surgery:  none    Assessment & Plan: Visit Diagnoses:  1. Kienbock's disease, left   2. Pain in left wrist     Plan: Extensive discussion was had with the patient today regarding her left wrist pain.  I reviewed the results of her prior workup which does show lunate sclerosis compatible with ongoing avascular necrosis and collapse.  This is consistent with her ongoing clinical examination.  There is also significant radiocarpal joint arthritic changes.  She is also demonstrating numbness and tingling in the left upper extremity concerning for progressive carpal tunnel syndrome.  She does have history of prior right carpal tunnel syndrome that underwent prior release.  We discussed treatment modalities for the ongoing lunate sclerosis and avascular necrosis concerning for Kienbock's disease.  Given that she is having progressive collapse of the lunate with pan carpal arthritis, she would be an appropriate  candidate for potential proximal row carpectomy and potential allograft spacer in the future.  However, I did explain to her that given the significance of this operation, it would be ideal if we could reduce her nicotine intake preoperatively in order to limit postoperative risks.  As for the numbness and tingling, I would like to obtain a left upper extremity electrodiagnostic study in order to better delineate potential nerve compression pathology.  I did explain that should she have ongoing carpal tunnel syndrome in this region, she would be an appropriate candidate for potential open versus endoscopic carpal tunnel release in the near future.  This could either be performed in conjunction with the salvage procedure for the left wrist arthritis and lunate collapse, or acute provide this procedure in advance while she works on nicotine cessation.  Patient expressed full understanding, will obtain the left upper extremity electrodiagnostic study as instructed and return to me for further discussion.  For the time being, she can continue with wrist bracing as needed.  I spent 45 minutes in the care of this patient today including review of previous documentation, imaging obtained, face-to-face time discussing all options regarding treatment and documenting the encounter.   Follow-up: No follow-ups on file.   Meds & Orders: No orders of the defined types were placed in this encounter.   Orders Placed This Encounter  Procedures   Ambulatory referral to Physical Medicine Rehab     Procedures: No procedures performed      Clinical History: No specialty comments available.  She reports that she has been smoking cigarettes. She started smoking about 26 years ago. She has a 26.9 pack-year smoking history.  She has never used smokeless tobacco.  Recent Labs    12/21/23 1004 05/08/24 1013  HGBA1C 6.2 6.4    Objective:   Vital Signs: There were no vitals taken for this visit.  Physical Exam   Gen: Well-appearing, in no acute distress; non-toxic CV: Regular Rate. Well-perfused. Warm.  Resp: Breathing unlabored on room air; no wheezing. Psych: Fluid speech in conversation; appropriate affect; normal thought process  Ortho Exam PHYSICAL EXAM:  General: Patient is well appearing and in no distress.  Skin and Muscle: Prior well-healed right carpal tunnel incision.  No other significant skin changes are apparent to hands.  Muscle bulk and contour normal, minimal signs of atrophy.     Range of Motion and Palpation Tests: Left wrist range of motion is limited secondary to pain, flexion/extension 55/45,.  Contralateral side 75/65.  No cords or nodules are palpated.  No triggering is observed.    Neurologic, Vascular, Motor: Sensation is diminished to light touch in the left median nerve distribution.  Positive Tinel's left carpal tunnel.  Phalen's positive left, Derkan's compression positive left  Fingers pink and well perfused.  Capillary refill is brisk.      Lab Results  Component Value Date   HGBA1C 6.4 05/08/2024      Imaging: No results found.  Past Medical/Family/Surgical/Social History: Medications & Allergies reviewed per EMR, new medications updated. Patient Active Problem List   Diagnosis Date Noted   Osteochondrosis of lunate of left wrist 05/08/2024   Wrist contusion 05/08/2024   Narcolepsy 01/03/2024   Angular stomatitis 01/03/2024   Psychosocial stressors 01/12/2023   Tobacco dependence 01/04/2023   Insulin  resistance 12/29/2022   SOBOE (shortness of breath on exertion) 11/24/2022   OSA on CPAP 11/24/2022   Osteoarthritis of both knees 11/24/2022   Pre-diabetes 11/24/2022   Depression 11/24/2022   BMI 45.0-49.9, adult (HCC) 11/24/2022   Obesity, Starting BMI 47.3 11/24/2022   Depression screen 11/24/2022   Foot pain, left 05/03/2022   Gastroenteritis 02/01/2022   Arthritis 10/23/2021   Blood coagulation disorder 10/23/2021    Degeneration of lumbar intervertebral disc 10/23/2021   History of abnormal cervical Papanicolaou smear 10/23/2021   Nausea & vomiting 10/23/2021   Knee pain, left 08/05/2021   Sinusitis 07/26/2021   COPD (chronic obstructive pulmonary disease) (HCC) 07/23/2021   Degenerative tear of medial meniscus of left knee 06/05/2021   Fall 04/30/2021   B12 deficiency 04/30/2021   Viral gastroenteritis 09/25/2019   Asthma 04/25/2019   Deep venous thrombosis (HCC) 04/25/2019   Diverticulosis of colon 04/25/2019   Headache 04/25/2019   Anxiety disorder 11/24/2018   Subungual exostosis 09/07/2018   Right foot pain 06/23/2018   Contusion of foot 06/22/2018   History of adenomatous polyp of colon 05/05/2018   Laceration of right index finger without foreign body without damage to nail 10/14/2017   Mouth sores 10/14/2017   Fatigue 04/11/2017   Shortness of breath on exertion 04/11/2017   Hyperglycemia 04/11/2017   Class 3 severe obesity with serious comorbidity and body mass index (BMI) of 45.0 to 49.9 in adult (HCC) 04/11/2017   Chest pain, atypical 03/11/2017   Low back pain 03/11/2017   Leg swelling 01/28/2017   Peripheral neuropathy 01/28/2017   Edema 12/03/2016   Chronic venous insufficiency 12/03/2016   Osteopenia 09/24/2016   Artificial menopause state 08/25/2016   Asymptomatic menopausal state 08/25/2016   Stress at home 07/07/2016   Insomnia 01/02/2016   GERD (gastroesophageal reflux disease) 09/01/2015   Acute bronchitis 06/27/2015  Wheezing 06/27/2015   Vitamin D  deficiency 06/09/2015   Family history of breast cancer    Cervical adenopathy 04/14/2015   Tachycardia 04/14/2015   Rash and nonspecific skin eruption 01/22/2015   ADD (attention deficit disorder) 01/22/2015   Restless leg syndrome 12/09/2014   CFS (chronic fatigue syndrome) 12/09/2014   OSA (obstructive sleep apnea) 12/09/2014   Hypothyroidism 12/09/2014   Paresthesia 12/09/2014   Heavy tobacco smoker >10  cigarettes per day 12/09/2014   History of DVT of lower extremity 12/09/2014   Discoid lupus 12/09/2014   Personal history of other venous thrombosis and embolism 12/09/2014   Past Medical History:  Diagnosis Date   ADHD (attention deficit hyperactivity disorder)    Ankle pain    Anxiety    Asthma    B12 deficiency    Back pain    Chest pain    Chronic fatigue syndrome    Constipation    COPD (chronic obstructive pulmonary disease) (HCC)    Depression    Discoid lupus    DVT (deep venous thrombosis) (HCC)    Dyspnea    Family history of breast cancer    Family history of colon cancer    Fibromyalgia    Genetic testing 06/20/2015   Negative genetic testing on the Breast/Ovarian cancer panel.  The Breast/Ovarian gene panel offered by [facility] includes sequencing and rearrangement analysis for the following 20 genes:  ATM, BARD1, BRCA1, BRCA2, BRIP1, CDH1, CHEK2, EPCAM, FANCC, MLH1, MSH2, MSH6, NBN, PALB2, PMS2, PTEN, RAD51C, RAD51D, TP53, and XRCC2.   The report date is June 19, 2015.    GERD (gastroesophageal reflux disease)    Hot flashes    Hypothyroidism    Insomnia    Joint pain    Leg edema    Lupus    Neuropathy    Obesity    Osteoarthritis    Pre-diabetes    Sciatica    Sleep apnea    SOB (shortness of breath)    Vitamin D  deficiency    Family History  Problem Relation Age of Onset   Cancer Mother    Breast cancer Mother 30       bilateral breast cancer   Depression Mother    Anxiety disorder Mother    Cancer Father    Hodgkin's lymphoma Father        dx in his 63s   Lung cancer Father 60   Lung cancer Sister 4       adenocarcinoma (also a smoker)   Breast cancer Sister 15   Dementia Sister    Diabetes Maternal Grandmother    Colon cancer Maternal Grandfather        late 35s   Diabetes Paternal Grandmother    Colon cancer Paternal Grandfather 81   Lupus Maternal Uncle    Colon cancer Maternal Uncle        dx in his 65s   Cancer Paternal Uncle         2 paternal uncles with cancer NOS   Cancer Other    Diabetes Other    Mental illness Other    Past Surgical History:  Procedure Laterality Date   ABDOMINAL HYSTERECTOMY     APPENDECTOMY     KNEE ARTHROSCOPY W/ MENISCAL REPAIR Right    TUBAL LIGATION  1987   VEIN LIGATION AND STRIPPING     Social History   Occupational History   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 [facility], Hand Surgery

## 2024-05-16 ENCOUNTER — Ambulatory Visit: Admitting: Orthopedic Surgery

## 2024-05-16 DIAGNOSIS — M25532 Pain in left wrist: Secondary | ICD-10-CM

## 2024-05-16 DIAGNOSIS — M92212 Osteochondrosis (juvenile) of carpal lunate [Kienbock], left hand: Secondary | ICD-10-CM

## 2024-05-17 ENCOUNTER — Encounter: Payer: Self-pay | Admitting: Neurology

## 2024-05-17 ENCOUNTER — Ambulatory Visit: Payer: PRIVATE HEALTH INSURANCE | Admitting: Neurology

## 2024-05-17 VITALS — BP 166/110 | HR 92 | Ht 63.0 in | Wt 243.1 lb

## 2024-05-17 DIAGNOSIS — Z8669 Personal history of other diseases of the nervous system and sense organs: Secondary | ICD-10-CM | POA: Diagnosis not present

## 2024-05-17 DIAGNOSIS — R351 Nocturia: Secondary | ICD-10-CM

## 2024-05-17 DIAGNOSIS — R030 Elevated blood-pressure reading, without diagnosis of hypertension: Secondary | ICD-10-CM | POA: Diagnosis not present

## 2024-05-17 DIAGNOSIS — Z9189 Other specified personal risk factors, not elsewhere classified: Secondary | ICD-10-CM | POA: Diagnosis not present

## 2024-05-17 DIAGNOSIS — G4719 Other hypersomnia: Secondary | ICD-10-CM

## 2024-05-17 DIAGNOSIS — Z79899 Other long term (current) drug therapy: Secondary | ICD-10-CM | POA: Diagnosis not present

## 2024-05-17 NOTE — Patient Instructions (Addendum)
  Your blood pressure is rather high today.  It was also high when you saw your primary care earlier this month.  Please make a follow-up appointment with him to discuss blood pressure management. Please talk to your primary care today about your Adderall which may contribute to your high blood pressure. Based on your symptoms and your exam I believe you are still at risk for obstructive sleep apnea (aka OSA). We should proceed with a sleep study to determine whether you do or do not have OSA and how severe it is. Even, if you have mild OSA, I may want you to consider treatment with CPAP, as treatment of even borderline or mild sleep apnea can result and improvement of symptoms such as sleep disruption, daytime sleepiness, nighttime bathroom breaks, restless leg symptoms, improvement of headache syndromes, even improved mood disorder.  As explained, an attended sleep study (meaning you get to stay overnight in the sleep lab), lets us  monitor sleep-related behaviors such as sleep talking and leg movements in sleep, in addition to monitoring for sleep apnea.  A home sleep test is a screening tool for sleep apnea diagnosis only, but unfortunately, does not help with any other sleep-related diagnoses. Please remember, the long-term risks and ramifications of untreated moderate to severe obstructive sleep apnea may include (but are not limited to): increased risk for cardiovascular disease, including congestive heart failure, stroke, difficult to control hypertension, treatment resistant obesity, arrhythmias, especially irregular heartbeat commonly known as A. Fib. (atrial fibrillation); even type 2 diabetes has been linked to untreated OSA.  Other correlations that untreated obstructive sleep apnea include macular edema which is swelling of the retina in the eyes, droopy eyelid syndrome, and elevated hemoglobin and hematocrit levels (often referred to as polycythemia). Sleep apnea can cause disruption of sleep and  sleep deprivation in most cases, which, in turn, can cause recurrent headaches, problems with memory, mood, concentration, focus, and vigilance. Most people with untreated sleep apnea report excessive daytime sleepiness, which can affect their ability to drive. Please do not drive or use heavy equipment or machinery, if you feel sleepy! Patients with sleep apnea can also develop difficulty initiating and maintaining sleep (aka insomnia).  Having sleep apnea may increase your risk for other sleep disorders, including involuntary behaviors sleep such as sleep terrors, sleep talking, sleepwalking.   Having sleep apnea can also increase your risk for restless leg syndrome and leg movements at night.  Please note that untreated obstructive sleep apnea may carry additional perioperative morbidity. Patients with significant obstructive sleep apnea (typically, in the moderate to severe degree) should receive, if possible, perioperative PAP (positive airway pressure) therapy and the surgeons and particularly the anesthesiologists should be informed of the diagnosis and the severity of the sleep disordered breathing.  We will call you or email you through MyChart with regards to your test results and plan a follow-up in sleep clinic accordingly. Most likely, you will hear from one of our nurses. Our sleep lab administrative assistant will call you to schedule your sleep study and give you further instructions, regarding the check in process for the sleep study, arrival time, what to bring, when you can expect to leave after the study, etc., and to answer any other logistical questions you may have. If you don't hear back from her by about 2 weeks from now, please feel free to call her direct line at [phone] or you can call our general clinic number, or email us  through My Chart.

## 2024-05-17 NOTE — Progress Notes (Signed)
 Subjective:    Patient ID: Tiffany Medina is a 59 y.o. female.  HPI   True Mar, MD, PhD [facility] [address], [address] [address] [place], [place] [address]   Dear Dr. Garald,  I saw your patient, Tiffany Medina, upon your kind request in my sleep clinic today for evaluation of her sleep disorder, in particular, reevaluation of her prior diagnosis of severe obstructive sleep apnea.  The patient is unaccompanied today.  As you know, Tiffany Medina is a 58 year old female with an underlying complex medical history of chronic pain, on chronic narcotic pain medication, anxiety, depression, osteoarthritis, ADHD, COPD, smoking, fibromyalgia, hypothyroidism, lupus, lower extremity edema, sciatica, vitamin D  deficiency, and severe obesity with a BMI of over 40, who reports snoring and excessive daytime somnolence.  She reports that she stopped using her CPAP a few months after starting it, she found it uncomfortable and could not tolerate her air.  She had some dental work done recently and her dentist stopped her about a dental device potentially.  She is working on weight loss and has lost about 40 pounds.  She still smokes about a pack a day and is working on smoking cessation.  She is eventually going to need bilateral knee replacements as I understand and will need left wrist surgery as well.    Of note, is on multiple medications including several psychotropic and potentially sedating medications including Xanax , Flexeril , hydrocodone .  In addition, she is on Adderall, and Wellbutrin . I had evaluated her for sleep apnea concern in the past several years ago.  She had a split-night sleep study through our sleep lab on 05/25/2015 which showed severe obstructive sleep apnea with an AHI of 42.3/h at baseline, O2 nadir 69% with significant time below 88% saturation.  She did well with CPAP of 8 cm at the time.  I prescribed a CPAP machine for her.  She was lost to follow-up  since then.   Of note, her blood pressure is highly elevated today and repeat blood pressure check at the end of the appointment was also elevated at 172/112.  She denies any symptoms such as shortness of breath or chest pain.  She reports that she took her Adderall today and she does not take any blood pressure medication.  She reports being in pain.  She is encouraged to get in touch with you this morning.  She has fallen a few times, most recently end of October.  She has had injuries from falls.  She does not use a cane or walker.  She has a left wrist brace.  She has seen sports medicine as well.  Her Epworth sleepiness score is 15 out of 24, fatigue severity score is 61 out of 63.  She smokes 1 pack/day, caffeine in the form of coffee, 2 cups in the mornings and 2 soda cans per day.  She has nocturia about 3-4 times per average night.  Bedtime is generally around 9 or 10 PM and rise time sometimes 6 AM, sometimes later.  She lives with her husband.  She does not work any longer.  Previously:  05/11/2015: 58 year old right-handed woman with an underlying medical history of ADD, hypothyroidism, restless leg syndrome, sciatica, history of DVTs (on ASA 650 mg daily, previously on coumadin), discoid lupus, anxiety disorder, and severe obesity, who reports snoring and excessive daytime somnolence as well as a prior diagnosis of OSA. She reports having had a sleep study in 1999. She was started on CPAP  therapy but only used it for a few days. Prior sleep test results are not available for my review. She would be willing to have another sleep study and try CPAP again. For restless leg syndrome she takes ropinirole  at night. Of note, she does take other potentially sedating medications, including gabapentin , tramadol , and Xanax . She takes Adderall for ADD. I reviewed your office note from 04/14/2015. Of note, the patient canceled the appointment previously for 01/28/2015. She has been on Requip  0.5 mg qHS with  good success. She has an ESS is 17/24 and fatigue score was 58/63. She has a bedtime of around 10 PM typically. It may take her up to 30 minutes to fall asleep. She takes Xanax  1 mg at night. She also takes gabapentin  at night. She has a history of recurrent DVTs. She has superficial clots in her left leg. She had vein stripping in the past. She has not recently seen a pain or vascular specialist. She has gained weight since her sleep study in 1999. She smokes a pack a day. She does not drink alcohol or use illicit drugs. She drinks quite a bit of caffeine in the form of sodas, 2-3 bottles of [employer], 20 pounds each, as well as 2 cups of coffee per day. 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 Rise time is usually 5:30 AM and she does not wake up rested. She has nocturia 3-4 times on an average night. She has numbness of her left leg. She has pain over left leg. She has not seen a rheumatologist in quite some time. She used to see hematology in the past. She has no family history of obstructive sleep apnea.   Her Past Medical History Is Significant For: Past Medical History:  Diagnosis Date   ADHD (attention deficit hyperactivity disorder)    Ankle pain    Anxiety    Asthma    B12 deficiency    Back pain    Chest pain    Chronic fatigue syndrome    Constipation    COPD (chronic obstructive pulmonary disease) (HCC)    Depression    Discoid lupus    DVT (deep venous thrombosis) (HCC)    Dyspnea    Family history of breast cancer    Family history of colon cancer    Fibromyalgia    Genetic testing 06/20/2015   Negative genetic testing on the Breast/Ovarian cancer panel.  The Breast/Ovarian gene panel offered by [facility] includes sequencing and rearrangement analysis for the following 20 genes:  ATM, BARD1, BRCA1, BRCA2, BRIP1, CDH1, CHEK2, EPCAM, FANCC, MLH1, MSH2, MSH6, NBN, PALB2, PMS2, PTEN, RAD51C, RAD51D, TP53, and XRCC2.    The report date is June 19, 2015.    GERD (gastroesophageal reflux disease)    Hot flashes    Hypothyroidism    Insomnia    Joint pain    Leg edema    Lupus    Neuropathy    Obesity    Osteoarthritis    Pre-diabetes    Sciatica    Sleep apnea    SOB (shortness of breath)    Vitamin D  deficiency     Her Past Surgical History Is Significant For: Past Surgical History:  Procedure Laterality Date   ABDOMINAL HYSTERECTOMY     APPENDECTOMY     KNEE ARTHROSCOPY W/ MENISCAL REPAIR Right    TUBAL LIGATION  1987   VEIN LIGATION  AND STRIPPING      Her Family History Is Significant For: Family History  Problem Relation Age of Onset   Cancer Mother    Breast cancer Mother 108       bilateral breast cancer   Depression Mother    Anxiety disorder Mother    Cancer Father    Hodgkin's lymphoma Father        dx in his 42s   Lung cancer Father 47   Lung cancer Sister 72       adenocarcinoma (also a smoker)   Breast cancer Sister 90   Dementia Sister    Lupus Maternal Uncle    Colon cancer Maternal Uncle        dx in his 1s   Cancer Paternal Uncle        2 paternal uncles with cancer NOS   Diabetes Maternal Grandmother    Colon cancer Maternal Grandfather        late 20s   Diabetes Paternal Grandmother    Colon cancer Paternal Grandfather 49   Cancer Other    Diabetes Other    Mental illness Other    Sleep apnea Neg Hx     Her Social History Is Significant For: Social History   Socioeconomic History   Marital status: Legally Separated    Spouse name: Toby   Number of children: 1   Years of education: College   Highest education level: Associate degree: occupational, scientist, product/process development, or vocational program  Occupational History   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  Tobacco Use   Smoking status: Every Day    Current packs/day: 1.00    Average packs/day: 1 pack/day for 26.9 years (26.9 ttl pk-yrs)    Types: Cigarettes    Start date: 1999   Smokeless tobacco:  Never  Vaping Use   Vaping status: Some Days  Substance and Sexual Activity   Alcohol use: No    Alcohol/week: 0.0 standard drinks of alcohol   Drug use: No   Sexual activity: Yes  Other Topics Concern   Not on file  Social History Narrative   Drinks 2 cups of coffee a day and 2 Dt Mt Dews   Pt lives with family    Disabled    Social Drivers of Health   Financial Resource Strain: High Risk (09/26/2022)   Overall Financial Resource Strain (CARDIA)    Difficulty of Paying Living Expenses: Very hard  Food Insecurity: Food Insecurity Present (09/26/2022)   Hunger Vital Sign    Worried About Running Out of Food in the Last Year: Sometimes true    Ran Out of Food in the Last Year: Sometimes true  Transportation Needs: No Transportation Needs (09/26/2022)   PRAPARE - Administrator, Civil Service (Medical): No    Lack of Transportation (Non-Medical): No  Physical Activity: Insufficiently Active (09/26/2022)   Exercise Vital Sign    Days of Exercise per Week: 3 days    Minutes of Exercise per Session: 30 min  Stress: Stress Concern Present (09/26/2022)   [employer] of Occupational Health - Occupational Stress Questionnaire    Feeling of Stress : Very much  Social Connections: Moderately Integrated (09/26/2022)   Social Connection and Isolation Panel    Frequency of Communication with Friends and Family: More than three times a week    Frequency of Social Gatherings with Friends and Family: Once a week    Attends Religious Services: 1 to 4 times per year  Active Member of Clubs or Organizations: No    Attends Engineer, Structural: Not on file    Marital Status: Married    Her Allergies Are:  Allergies  Allergen Reactions   Dihydrotachysterol Hives, Itching and Rash   Vitamin D  Analogs Hives, Itching and Rash    Flushing   Meloxicam      ?SOB (can take Ibuprofen )   Metformin  And Related     ?diarrhea  :   Her Current Medications Are:  Outpatient  Encounter Medications as of 05/17/2024  Medication Sig   albuterol  (PROVENTIL  HFA) 108 (90 Base) MCG/ACT inhaler Inhale 2 puffs into the lungs every 4 (four) hours as needed for wheezing or shortness of breath.   ALPRAZolam  (XANAX ) 0.5 MG tablet TAKE 3 TABLETS BY MOUTH EVERY DAY AT BEDTIME AS NEEDED   amphetamine -dextroamphetamine  (ADDERALL) 5 MG tablet 5 mg in am, 5 mg at lunch   amphetamine -dextroamphetamine  (ADDERALL) 5 MG tablet 5 mg in am, 5 mg at lunch   ASPIRIN  81 PO Take 2 tablets by mouth daily.   buPROPion  (WELLBUTRIN  SR) 200 MG 12 hr tablet Take 1 tablet (200 mg total) by mouth daily.   calcitRIOL  (ROCALTROL ) 0.5 MCG capsule TAKE 1 CAPSULE(0.5 MCG) BY MOUTH IN THE MORNING AND AT BEDTIME   clotrimazole -betamethasone  (LOTRISONE ) cream Apply 1 Application topically 2 (two) times daily. Use prn   cyanocobalamin  (VITAMIN B12) 1000 MCG/ML injection Inject 1 mL (1,000 mcg total) into the skin every 14 (fourteen) days.   cyclobenzaprine  (FLEXERIL ) 10 MG tablet TAKE 1 TABLET(10 MG) BY MOUTH THREE TIMES DAILY AS NEEDED   diphenoxylate -atropine  (LOMOTIL ) 2.5-0.025 MG tablet Take 1 tablet by mouth 4 (four) times daily as needed for diarrhea or loose stools.   estradiol (VIVELLE-DOT) 0.1 MG/24HR patch APPLY 1 PATCH TRANSDERMALLY TWICE A WEEK   furosemide  (LASIX ) 20 MG tablet Take 1 tablet (20 mg total) by mouth daily.   HYDROcodone -acetaminophen  (NORCO) 7.5-325 MG tablet Take 1 tablet by mouth every 8 (eight) hours as needed for severe pain (pain score 7-10).   HYDROcodone -acetaminophen  (NORCO) 7.5-325 MG tablet Take 1 tablet by mouth every 8 (eight) hours as needed for severe pain (pain score 7-10).   ibuprofen  (ADVIL ) 800 MG tablet TAKE 1 TABLET(800 MG) BY MOUTH EVERY 8 HOURS AS NEEDED   levothyroxine  (SYNTHROID ) 100 MCG tablet Take 1 tablet (100 mcg total) by mouth daily before breakfast. TAKE 1/2 TABLET BY MOUTH ONCE DAILY BEFORE BREAKFAST (50mcg)   pantoprazole  (PROTONIX ) 40 MG tablet Take 1  tablet (40 mg total) by mouth 2 (two) times daily.   promethazine  (PHENERGAN ) 25 MG tablet Take 1 tablet (25 mg total) by mouth every 6 (six) hours as needed for up to 7 days for nausea or vomiting.   SYRINGE-NEEDLE, DISP, 3 ML (BD ECLIPSE SYRINGE) 25G X 1 3 ML MISC As directed   No facility-administered encounter medications on file as of 05/17/2024.  :   Review of Systems:  Out of a complete 14 point review of systems, all are reviewed and negative with the exception of these symptoms as listed below:  Review of Systems  Objective:  Neurological Exam  Physical Exam Physical Examination:   Vitals:   05/17/24 0927  BP: (!) 166/110  Pulse: 92    General Examination: The patient is a very pleasant 58 y.o. female in no acute distress. She appears well-developed and well-nourished and well groomed.  Denies any chest pain, shortness of breath, headache or blurry vision with her elevated blood  pressure.  HEENT: Normocephalic, atraumatic, pupils are equal, round and reactive to light, extraocular tracking is good without limitation to gaze excursion or nystagmus noted. No photophobia. Corrective eye glasses in place. Hearing is grossly intact.  Face is symmetric with normal facial animation. Speech is clear without dysarthria. There is no hypophonia. There is no lip, neck/head, jaw or voice tremor. Neck is supple with full range of passive and active motion. There are no carotid bruits on auscultation.  Airway/Oropharynx exam reveals: Moderate mouth dryness, adequate dental hygiene, Mallampati class II, neck circumference 16 1 eighths inches, larger uvula, tonsillar size 1+ bilaterally.  Tongue protrudes centrally and palate elevates symmetrically.  Chest: Bilateral rhonchi noted, no wheezing.  Heart: S1+S2+0, regular and normal without murmurs, rubs or gallops noted.   Abdomen: Soft, non-tender and non-distended.  Extremities: There is trace to 1+ edema in the distal left lower  extremity, trace edema in the distal right lower extremity, with chronic appearing discoloration noted in both lower legs bilaterally.   Skin: Warm and dry without trophic changes noted.   Musculoskeletal: exam reveals left wrist in a Velcro brace.   Neurologically:  Mental status: The patient is awake, alert and oriented in all 4 spheres. Her immediate and remote memory, attention, language skills and fund of knowledge are appropriate. There is no evidence of aphasia, agnosia, apraxia or anomia. Speech is clear with normal prosody and enunciation. Thought process is linear. Mood is normal and affect is normal.  Cranial nerves II - XII are as described above under HEENT exam.  Motor exam: Normal bulk, moving all 4 extremities with the exception of restriction of right elbow and left wrist.   No obvious resting or action tremor noted.   Fine motor skills and coordination: Intact grossly.  Cerebellar testing: No dysmetria or intention tremor. There is no truncal or gait ataxia.  Sensory exam: intact to light touch in the upper and lower extremities.  Gait, station and balance: Without difficulty and walks without a walking aid.   Assessment and plan:   In summary, Tiffany Medina is a very pleasant41 year old female with an underlying complex medical history of chronic pain, on chronic narcotic pain medication, anxiety, depression, osteoarthritis, ADHD, COPD, smoking, fibromyalgia, hypothyroidism, lupus, lower extremity edema, sciatica, vitamin D  deficiency, and severe obesity with a BMI of over 40, who presents for reevaluation of her severe obstructive sleep apnea as diagnosed with a split-night sleep study in 2016.  She has not been on PAP therapy since 2017 or thereabouts.  A laboratory attended sleep study is typically considered gold standard for evaluation of sleep disordered breathing. Her blood pressure is highly elevated today, also upon recheck.  She is advised to get in touch with you  today regarding her elevated blood pressure values. I had a long chat with the patient about my findings and the diagnosis of sleep apnea, particularly OSA, its prognosis and treatment options. We talked about medical/conservative treatments, surgical interventions and non-pharmacological approaches for symptom control. I explained, in particular, the risks and ramifications of untreated moderate to severe OSA, especially with respect to developing cardiovascular disease down the road, including congestive heart failure (CHF), difficult to treat hypertension, cardiac arrhythmias (particularly A-fib), neurovascular complications including TIA, stroke and dementia. Even type 2 diabetes has, in part, been linked to untreated OSA. Symptoms of untreated OSA may include (but may not be limited to) daytime sleepiness, nocturia (i.e. frequent nighttime urination), memory problems, mood irritability and suboptimally controlled or worsening mood disorder such  as depression and/or anxiety, lack of energy, lack of motivation, physical discomfort, as well as recurrent headaches, especially morning or nocturnal headaches. We talked about the importance of maintaining a healthy lifestyle and striving for healthy weight.  The importance of complete smoking cessation was also addressed.  In addition, we talked about the importance of striving for and maintaining good sleep hygiene. I recommended a sleep study at this time. I outlined the differences between a laboratory attended sleep study which is considered more comprehensive and accurate over the option of a home sleep test (HST); the latter may lead to underestimation of sleep disordered breathing in some instances and does not help with diagnosing upper airway resistance syndrome and is not accurate enough to diagnose primary central sleep apnea typically. I outlined possible surgical and non-surgical treatment options of OSA, including the use of a positive airway pressure  (PAP) device (i.e. CPAP, AutoPAP/APAP or BiPAP in certain circumstances), a custom-made dental device (aka oral appliance, which would require a referral to a specialist dentist or orthodontist typically, and is generally speaking not considered for patients with full dentures or edentulous state), upper airway surgical options, such as traditional UPPP (which is not considered a first-line treatment) or the Inspire device (hypoglossal nerve stimulator, which would involve a referral for consultation with an ENT surgeon, after careful selection, following inclusion criteria - also not first-line treatment). I explained the PAP treatment option to the patient in detail, as this is generally considered first-line treatment.  The patient indicated that she would be willing to try PAP therapy, if the need arises. I explained the importance of being compliant with PAP treatment, not only for insurance purposes but primarily to improve patient's symptoms symptoms, and for the patient's long term health benefit, including to reduce Her cardiovascular risks longer-term.  I will prescribe new supplies for her current machine so she can start using it again.  This was an extended visit of over 60 minutes with copious record review involved in considerable counseling and coordination of care. We will pick up our discussion about the next steps and treatment options after testing.  We will keep her posted as to the test results by phone call and/or MyChart messaging where possible.  We will plan to follow-up in sleep clinic accordingly as well.  I answered all herl questions today and the patient wasx in agreement.   I encouraged her to call with any interim questions, concerns, problems or updates or email us  through MyChart.  Generally speaking, sleep test authorizations may take up to 2 weeks, sometimes less, sometimes longer, the patient is encouraged to get in touch with us  if they do not hear back from the sleep lab  staff directly within the next 2 weeks.  Thank you very much for allowing me to participate in the care of this nice patient. If I can be of any further assistance to you please do not hesitate to call me at [phone].  Sincerely,   True Mar, MD, PhD

## 2024-05-22 NOTE — Progress Notes (Signed)
 Community message has been sent to [facility] for mask and supplies on 05/22/24. DD

## 2024-05-24 ENCOUNTER — Telehealth: Payer: Self-pay

## 2024-05-24 NOTE — Telephone Encounter (Signed)
 We will proceed with a sleep study as planned and as discussed. Please advise patient to check in with us  in one week, if she has not heard from the sleep lab by then.

## 2024-05-24 NOTE — Telephone Encounter (Signed)
 I called the patient to inform her that we could not get the order for new supplies process due to her not being compliant with her old machine for at least 30 days. I messaged [facility] back stating the supplies were for her old machine until we can get her schedule for a new sleep study. Unfortunately the order for supplies still could not be processed.   Please advise if the patient needs to come in sooner for a sleep study to restart use of a sleep machine. She has no supplies to be compliant for 30 days with her old machine.

## 2024-05-29 ENCOUNTER — Telehealth: Payer: Self-pay

## 2024-05-29 ENCOUNTER — Ambulatory Visit (INDEPENDENT_AMBULATORY_CARE_PROVIDER_SITE_OTHER): Admitting: Internal Medicine

## 2024-05-29 ENCOUNTER — Encounter: Payer: Self-pay | Admitting: Internal Medicine

## 2024-05-29 VITALS — BP 168/108 | HR 87 | Ht 63.0 in

## 2024-05-29 DIAGNOSIS — F419 Anxiety disorder, unspecified: Secondary | ICD-10-CM

## 2024-05-29 DIAGNOSIS — I10 Essential (primary) hypertension: Secondary | ICD-10-CM | POA: Insufficient documentation

## 2024-05-29 DIAGNOSIS — R7303 Prediabetes: Secondary | ICD-10-CM | POA: Diagnosis not present

## 2024-05-29 DIAGNOSIS — F909 Attention-deficit hyperactivity disorder, unspecified type: Secondary | ICD-10-CM

## 2024-05-29 DIAGNOSIS — I158 Other secondary hypertension: Secondary | ICD-10-CM | POA: Diagnosis not present

## 2024-05-29 MED ORDER — MODAFINIL 100 MG PO TABS: 100.0000 mg | ORAL_TABLET | Freq: Every day | ORAL | 2 refills | Status: DC

## 2024-05-29 NOTE — Assessment & Plan Note (Signed)
 D/c Adderall Loose wt Pt declined BP meds

## 2024-05-29 NOTE — Assessment & Plan Note (Signed)
 Stress, grief discussed

## 2024-05-29 NOTE — Telephone Encounter (Signed)
 Copied from CRM [id number]. Topic: Clinical - Prescription Issue >> May 29, 2024 10:01 AM Charolett L wrote: Reason for CRM: Patient would like medication modafinil  (PROVIGIL ) 100 MG tablet resent to the [facility] pharmacy list below because the cost if the medication is cheaper there...  [facility] [address], [place] - [address] [place] [address] Phone: [phone] Fax: [phone]

## 2024-05-29 NOTE — Progress Notes (Signed)
 Subjective:  Patient ID: Tiffany Medina, female    DOB: 09-27-1965  Age: 58 y.o. MRN: [id number]  CC: Medication Reaction (Discuss alternatives to adderall due to patient having bouts of tachycardia and hypertension)   HPI Tiffany Medina presents for CTS, AVN - L wrist, needs surgery.  C/o stess C/o tachycardia and elevated BP Provigil  worked ok in the past w/o tachy or elevated BP OSA w/ daytime somnolence  Outpatient Medications Prior to Visit  Medication Sig Dispense Refill   albuterol  (PROVENTIL  HFA) 108 (90 Base) MCG/ACT inhaler Inhale 2 puffs into the lungs every 4 (four) hours as needed for wheezing or shortness of breath. 8.5 g 11   ALPRAZolam  (XANAX ) 0.5 MG tablet TAKE 3 TABLETS BY MOUTH EVERY DAY AT BEDTIME AS NEEDED 90 tablet 1   ASPIRIN  81 PO Take 2 tablets by mouth daily.     buPROPion  (WELLBUTRIN  SR) 200 MG 12 hr tablet Take 1 tablet (200 mg total) by mouth daily. 90 tablet 3   calcitRIOL  (ROCALTROL ) 0.5 MCG capsule TAKE 1 CAPSULE(0.5 MCG) BY MOUTH IN THE MORNING AND AT BEDTIME 60 capsule 11   clotrimazole -betamethasone  (LOTRISONE ) cream Apply 1 Application topically 2 (two) times daily. Use prn 60 g 0   cyanocobalamin  (VITAMIN B12) 1000 MCG/ML injection Inject 1 mL (1,000 mcg total) into the skin every 14 (fourteen) days. 6 mL 3   cyclobenzaprine  (FLEXERIL ) 10 MG tablet TAKE 1 TABLET(10 MG) BY MOUTH THREE TIMES DAILY AS NEEDED 90 tablet 1   diphenoxylate -atropine  (LOMOTIL ) 2.5-0.025 MG tablet Take 1 tablet by mouth 4 (four) times daily as needed for diarrhea or loose stools. 60 tablet 1   estradiol (VIVELLE-DOT) 0.1 MG/24HR patch APPLY 1 PATCH TRANSDERMALLY TWICE A WEEK     furosemide  (LASIX ) 20 MG tablet Take 1 tablet (20 mg total) by mouth daily. 90 tablet 3   HYDROcodone -acetaminophen  (NORCO) 7.5-325 MG tablet Take 1 tablet by mouth every 8 (eight) hours as needed for severe pain (pain score 7-10). 90 tablet 0   HYDROcodone -acetaminophen  (NORCO) 7.5-325 MG tablet Take 1  tablet by mouth every 8 (eight) hours as needed for severe pain (pain score 7-10). 90 tablet 0   ibuprofen  (ADVIL ) 800 MG tablet TAKE 1 TABLET(800 MG) BY MOUTH EVERY 8 HOURS AS NEEDED 90 tablet 0   levothyroxine  (SYNTHROID ) 100 MCG tablet Take 1 tablet (100 mcg total) by mouth daily before breakfast. TAKE 1/2 TABLET BY MOUTH ONCE DAILY BEFORE BREAKFAST (50mcg) 90 tablet 3   pantoprazole  (PROTONIX ) 40 MG tablet Take 1 tablet (40 mg total) by mouth 2 (two) times daily. 180 tablet 3   promethazine  (PHENERGAN ) 25 MG tablet Take 1 tablet (25 mg total) by mouth every 6 (six) hours as needed for up to 7 days for nausea or vomiting. 60 tablet 1   SYRINGE-NEEDLE, DISP, 3 ML (BD ECLIPSE SYRINGE) 25G X 1 3 ML MISC As directed 50 each 3   amphetamine -dextroamphetamine  (ADDERALL) 5 MG tablet 5 mg in am, 5 mg at lunch 60 tablet 0   amphetamine -dextroamphetamine  (ADDERALL) 5 MG tablet 5 mg in am, 5 mg at lunch 60 tablet 0   No facility-administered medications prior to visit.    ROS: Review of Systems  Constitutional:  Positive for fatigue. Negative for activity change, appetite change, chills and unexpected weight change.  HENT:  Negative for congestion, mouth sores and sinus pressure.   Eyes:  Negative for visual disturbance.  Respiratory:  Negative for cough and chest tightness.   Cardiovascular:  Positive  for palpitations.  Gastrointestinal:  Negative for abdominal pain and nausea.  Genitourinary:  Negative for difficulty urinating, frequency and vaginal pain.  Musculoskeletal:  Positive for arthralgias, back pain and gait problem.  Skin:  Negative for pallor and rash.  Neurological:  Negative for dizziness, tremors, weakness, numbness and headaches.  Psychiatric/Behavioral:  Positive for sleep disturbance. Negative for confusion and suicidal ideas. The patient is nervous/anxious.     Objective:  BP (!) 168/108   Pulse 87   Ht 5' 3 (1.6 m)   SpO2 94%   BMI 43.06 kg/m   BP Readings from Last  3 Encounters:  05/29/24 (!) 168/108  05/17/24 (!) 166/110  05/08/24 (!) 144/100    Wt Readings from Last 3 Encounters:  05/17/24 243 lb 1.6 oz (110.3 kg)  05/08/24 247 lb (112 kg)  05/08/24 247 lb (112 kg)    Physical Exam Constitutional:      General: She is not in acute distress.    Appearance: She is well-developed. She is obese. She is not ill-appearing.  HENT:     Head: Normocephalic.     Right Ear: External ear normal.     Left Ear: External ear normal.     Nose: Nose normal.  Eyes:     General:        Right eye: No discharge.        Left eye: No discharge.     Conjunctiva/sclera: Conjunctivae normal.     Pupils: Pupils are equal, round, and reactive to light.  Neck:     Thyroid : No thyromegaly.     Vascular: No JVD.     Trachea: No tracheal deviation.  Cardiovascular:     Rate and Rhythm: Normal rate and regular rhythm.     Heart sounds: Normal heart sounds.  Pulmonary:     Effort: No respiratory distress.     Breath sounds: No stridor. No wheezing.  Abdominal:     General: Bowel sounds are normal. There is no distension.     Palpations: Abdomen is soft. There is no mass.     Tenderness: There is no abdominal tenderness. There is no guarding or rebound.  Musculoskeletal:        General: Tenderness present.     Cervical back: Normal range of motion and neck supple. No rigidity.     Right lower leg: Edema present.     Left lower leg: Edema present.  Lymphadenopathy:     Cervical: No cervical adenopathy.  Skin:    Findings: No erythema or rash.  Neurological:     Mental Status: She is oriented to person, place, and time.     Cranial Nerves: No cranial nerve deficit.     Motor: No abnormal muscle tone.     Coordination: Coordination normal.     Deep Tendon Reflexes: Reflexes normal.  Psychiatric:        Behavior: Behavior normal.        Thought Content: Thought content normal.        Judgment: Judgment normal.   LS spine w/pain L wrist in a splint HR  80s  Lab Results  Component Value Date   WBC 7.9 09/06/2023   HGB 15.7 (H) 09/06/2023   HCT 47.8 (H) 09/06/2023   PLT 411.0 (H) 09/06/2023   GLUCOSE 88 05/08/2024   CHOL 154 09/06/2023   TRIG 107.0 09/06/2023   HDL 52.80 09/06/2023   LDLCALC 79 09/06/2023   ALT 21 05/08/2024   AST 19 05/08/2024  NA 138 05/08/2024   K 4.5 05/08/2024   CL 99 05/08/2024   CREATININE 1.17 05/08/2024   BUN 23 05/08/2024   CO2 30 05/08/2024   TSH 0.60 12/21/2023   INR 1.02 11/28/2014   HGBA1C 6.4 05/08/2024    DG INJECT DIAG/THERA/INC NEEDLE/CATH/PLC EPI/LUMB/SAC W/IMG Result Date: 01/25/2023 CLINICAL DATA:  Low back pain radiating to the left leg all the way to the foot. FLUOROSCOPY: Radiation Exposure Index (as provided by the fluoroscopic device): 0 minutes 42 seconds. 70.58 micro gray meter squared PROCEDURE: The procedure, risks, benefits, and alternatives were explained to the patient. Questions regarding the procedure were encouraged and answered. The patient understands and consents to the procedure. LUMBAR EPIDURAL INJECTION: An interlaminar approach was performed on the left at L5-S1. The overlying skin was cleansed and anesthetized. A 20 gauge epidural needle was advanced using loss-of-resistance technique. DIAGNOSTIC EPIDURAL INJECTION: Injection of Isovue -M 200 shows a good epidural pattern with spread above and below the level of needle placement, primarily on the left no vascular opacification is seen. THERAPEUTIC EPIDURAL INJECTION: Eighty mg of Depo-Medrol  mixed with 2 cc 1% lidocaine were instilled. The procedure was well-tolerated, and the patient was discharged thirty minutes following the injection in good condition. COMPLICATIONS: None IMPRESSION: Technically successful L5-S1 left epidural steroid injection. Electronically Signed   By: Oneil Officer M.D.   On: 01/25/2023 15:02    Assessment & Plan:   Problem List Items Addressed This Visit     ADD (attention deficit disorder) -  Primary   D/c Adderall due to tachy, HTN. Provigil  worked ok in the past w/o tachy or elevated BP for her OSA w/ daytime somnolence. Start Provigyl  Potential benefits of a long term stimulants  use as well as potential risks  and complications were explained to the patient and were aknowledged.       Anxiety disorder   Stress, grief discussed      HTN (hypertension)   D/c Adderall Loose wt Pt declined BP meds      Pre-diabetes   Monitor A1c          Meds ordered this encounter  Medications   modafinil  (PROVIGIL ) 100 MG tablet    Sig: Take 1 tablet (100 mg total) by mouth daily.    Dispense:  30 tablet    Refill:  2      Follow-up: Return in about 4 weeks (around 06/26/2024) for a follow-up visit.  Marolyn Noel, MD

## 2024-05-29 NOTE — Assessment & Plan Note (Signed)
Monitor A1c 

## 2024-05-29 NOTE — Assessment & Plan Note (Signed)
 D/c Adderall due to tachy, HTN. Provigil  worked ok in the past w/o tachy or elevated BP for her OSA w/ daytime somnolence. Start Provigyl  Potential benefits of a long term stimulants  use as well as potential risks  and complications were explained to the patient and were aknowledged.

## 2024-05-30 MED ORDER — MODAFINIL 100 MG PO TABS: 100.0000 mg | ORAL_TABLET | Freq: Every day | ORAL | 2 refills | Status: DC

## 2024-05-30 NOTE — Telephone Encounter (Signed)
 Okay.  Thanks.

## 2024-05-30 NOTE — Addendum Note (Signed)
 Addended by: Maahi Lannan V on: 05/30/2024 12:54 PM   Modules accepted: Orders

## 2024-05-30 NOTE — Telephone Encounter (Signed)
 Patient called. Requesting a follow up on medication to be sent to the [facility].

## 2024-06-01 ENCOUNTER — Encounter

## 2024-06-01 DIAGNOSIS — Z8669 Personal history of other diseases of the nervous system and sense organs: Secondary | ICD-10-CM

## 2024-06-01 DIAGNOSIS — R030 Elevated blood-pressure reading, without diagnosis of hypertension: Secondary | ICD-10-CM

## 2024-06-01 DIAGNOSIS — R351 Nocturia: Secondary | ICD-10-CM

## 2024-06-01 DIAGNOSIS — Z9189 Other specified personal risk factors, not elsewhere classified: Secondary | ICD-10-CM

## 2024-06-01 DIAGNOSIS — G4719 Other hypersomnia: Secondary | ICD-10-CM

## 2024-06-01 DIAGNOSIS — Z79899 Other long term (current) drug therapy: Secondary | ICD-10-CM

## 2024-06-04 MED ORDER — MODAFINIL 100 MG PO TABS: 100.0000 mg | ORAL_TABLET | Freq: Every day | ORAL | 2 refills | Status: AC

## 2024-06-04 NOTE — Telephone Encounter (Signed)
 Patient called to follow up on medication being sent to the [facility]. Please send to pharmacy as soon as possible. Please call back patient if any questions.

## 2024-06-04 NOTE — Telephone Encounter (Signed)
 Okay.  Thanks.

## 2024-06-04 NOTE — Addendum Note (Signed)
 Addended by: Jayziah Bankhead V on: 06/04/2024 11:17 PM   Modules accepted: Orders

## 2024-06-05 ENCOUNTER — Telehealth: Payer: Self-pay

## 2024-06-05 ENCOUNTER — Other Ambulatory Visit (HOSPITAL_COMMUNITY): Payer: Self-pay

## 2024-06-05 NOTE — Telephone Encounter (Signed)
 Pharmacy Patient Advocate Encounter   Received notification from Pt Calls Messages that prior authorization for Modafinil  100mg  tabs is required/requested.   Insurance verification completed.   The patient is insured through [facility].   Per test claim: PA required; PA submitted to above mentioned insurance via Latent Key/confirmation #/EOC [id number] Status is pending

## 2024-06-05 NOTE — Telephone Encounter (Signed)
 Pharmacy Patient Advocate Encounter  Received notification from [employer] that Prior Authorization for Modafinil  100mg  tabs has been APPROVED from 06/05/24 to 07/04/25   PA #/Case ID/Reference #: [id number]

## 2024-06-05 NOTE — Telephone Encounter (Signed)
Please initiate PA for patient's medication.

## 2024-06-07 ENCOUNTER — Telehealth: Payer: Self-pay

## 2024-06-07 ENCOUNTER — Other Ambulatory Visit (HOSPITAL_COMMUNITY): Payer: Self-pay

## 2024-06-07 NOTE — Telephone Encounter (Signed)
 Pharmacy Patient Advocate Encounter  Received notification from [employer] that Prior Authorization for Hydrocodone /APAP 7.5/325mg  tabs has been APPROVED from 06/07/24 to 07/04/25   PA #/Case ID/Reference #: [id number]

## 2024-06-07 NOTE — Telephone Encounter (Signed)
 Pharmacy Patient Advocate Encounter   Received notification from Patient Pharmacy that prior authorization for Hydrocodone /APAP 7.5/325mg  tabs is required/requested.   Insurance verification completed.   The patient is insured through [facility].   Per test claim: PA required; PA submitted to above mentioned insurance via Latent Key/confirmation #/EOC [id number] Status is pending

## 2024-06-12 ENCOUNTER — Encounter: Admitting: Physical Medicine and Rehabilitation

## 2024-07-01 ENCOUNTER — Other Ambulatory Visit: Payer: Self-pay | Admitting: Internal Medicine

## 2024-07-04 ENCOUNTER — Encounter: Admitting: Physical Medicine and Rehabilitation

## 2024-07-05 ENCOUNTER — Other Ambulatory Visit: Payer: Self-pay | Admitting: Internal Medicine

## 2024-07-08 ENCOUNTER — Other Ambulatory Visit: Payer: Self-pay | Admitting: Internal Medicine

## 2024-07-08 DIAGNOSIS — F419 Anxiety disorder, unspecified: Secondary | ICD-10-CM

## 2024-07-09 ENCOUNTER — Other Ambulatory Visit: Payer: Self-pay | Admitting: Internal Medicine

## 2024-07-09 DIAGNOSIS — F419 Anxiety disorder, unspecified: Secondary | ICD-10-CM

## 2024-07-09 NOTE — Telephone Encounter (Unsigned)
 Copied from CRM [id number]. Topic: Clinical - Medication Refill >> Jul 09, 2024 10:50 AM Carlyon D wrote: Medication:  HYDROcodone -acetaminophen  (NORCO) 7.5-325 MG tablet  Has the patient contacted their pharmacy? Yes (Agent: If no, request that the patient contact the pharmacy for the refill. If patient does not wish to contact the pharmacy document the reason why and proceed with request.) (Agent: If yes, when and what did the pharmacy advise?)  This is the patient's preferred pharmacy:  [id number], [place] - [address] AT [facility] OF [facility] & [address] [facility] [place] [address] Phone: [phone] Fax: [phone]    Is this the correct pharmacy for this prescription? Yes If no, delete pharmacy and type the correct one.   Has the prescription been filled recently? No  Is the patient out of the medication? Has a few pills left   Has the patient been seen for an appointment in the last year OR does the patient have an upcoming appointment? Yes  Can we respond through MyChart? Yes  Agent: Please be advised that Rx refills may take up to 3 business days. We ask that you follow-up with your pharmacy.

## 2024-07-09 NOTE — Telephone Encounter (Unsigned)
 Copied from CRM [id number]. Topic: Clinical - Medication Refill >> Jul 09, 2024 10:56 AM Carlyon D wrote: Medication: cyclobenzaprine  (FLEXERIL ) 10 MG tablet  Has the patient contacted their pharmacy? Yes (Agent: If no, request that the patient contact the pharmacy for the refill. If patient does not wish to contact the pharmacy document the reason why and proceed with request.) (Agent: If yes, when and what did the pharmacy advise?)  This is the patient's preferred pharmacy:  [facility] #[id number], [place] - [address] AT [facility] OF [facility] & [address] [facility] [place] [address] Phone: [phone] Fax: [phone]   Is this the correct pharmacy for this prescription? Yes If no, delete pharmacy and type the correct one.   Has the prescription been filled recently? Yes  Is the patient out of the medication? No, has a few left   Has the patient been seen for an appointment in the last year OR does the patient have an upcoming appointment? Yes  Can we respond through MyChart? Yes  Agent: Please be advised that Rx refills may take up to 3 business days. We ask that you follow-up with your pharmacy.

## 2024-07-09 NOTE — Telephone Encounter (Unsigned)
 Copied from CRM [id number]. Topic: Clinical - Medication Refill >> Jul 09, 2024  8:28 AM Rea C wrote: Medication: ALPRAZolam  (XANAX ) 0.5 MG tablet, cyclobenzaprine  (FLEXERIL ) 10 MG tablet, HYDROcodone -acetaminophen  (NORCO) 7.5-325 MG tablet  Has the patient contacted their pharmacy? Tried to do them on MyChart  (Agent: If no, request that the patient contact the pharmacy for the refill. If patient does not wish to contact the pharmacy document the reason why and proceed with request.) (Agent: If yes, when and what did the pharmacy advise?)  This is the patient's preferred pharmacy:  [facility] #[id number], [place] - [address] AT [facility] OF [facility] & [address] [facility] [place] [address] Phone: [phone] Fax: [phone]   Is this the correct pharmacy for this prescription? Yes If no, delete pharmacy and type the correct one.   Has the prescription been filled recently? Yes  Is the patient out of the medication? Yes  Has the patient been seen for an appointment in the last year OR does the patient have an upcoming appointment? Yes  Can we respond through MyChart? Yes  Agent: Please be advised that Rx refills may take up to 3 business days. We ask that you follow-up with your pharmacy.

## 2024-07-10 ENCOUNTER — Ambulatory Visit: Payer: Self-pay

## 2024-07-10 NOTE — Telephone Encounter (Signed)
 FYI Only or Action Required?: FYI only for provider: UC advised.  Patient was last seen in primary care on 05/29/2024 by Plotnikov, Karlynn GAILS, MD.  Called Nurse Triage reporting Cough.  Symptoms began a week ago.  Interventions attempted: Prescription medications: Albuterol  Inhaler, Ibuprofen , hydrocodone .  Symptoms are: gradually worsening.  Triage Disposition: No disposition on file.  Patient/caregiver understands and will follow disposition?:    Reason for Disposition  Wheezing is present  Answer Assessment - Initial Assessment Questions Patient uses Albuterol  inhaler prn. 800 MG ibuprofen , hydrocodone . No OTC cough medicine or decongestants. Advised UC due to no appointments in offices within dispo. Patient agreeable.   1. ONSET: When did the cough begin?      Week ago  2. SEVERITY: How bad is the cough today?      Worsened  3. SPUTUM: Describe the color of your sputum (e.g., none, dry cough; clear, white, yellow, green)     Yellow  4. HEMOPTYSIS: Are you coughing up any blood? If Yes, ask: How much? (e.g., flecks, streaks, tablespoons, etc.)     Denies   5. DIFFICULTY BREATHING: Are you having difficulty breathing? If Yes, ask: How bad is it? (e.g., mild, moderate, severe)      Denies  6. FEVER: Do you have a fever? If Yes, ask: What is your temperature, how was it measured, and when did it start?     101 earlier today, forehead thermometer.  7. CARDIAC HISTORY: Do you have any history of heart disease? (e.g., heart attack, congestive heart failure)      Denies  8. LUNG HISTORY: Do you have any history of lung disease?  (e.g., pulmonary embolus, asthma, emphysema)     Asthma, COPD  9. PE RISK FACTORS: Do you have a history of blood clots? (or: recent major surgery, recent prolonged travel, bedridden)     Hx of blood clots, states its been quite a while.   10. OTHER SYMPTOMS: Do you have any other symptoms? (e.g., runny nose, wheezing,  chest pain)       Chest tightness and wheezing, denies SOB, headache, body aches especially in left hip, nausea, post nasal drip, diarrhea, food tasting weird, not like it should. Sore throat, right ear pain.  Protocols used: Cough - Acute Productive-A-AH  Copied from CRM #[id number]. Topic: Clinical - Red Word Triage >> Jul 10, 2024 10:23 AM Joesph NOVAK wrote: Red Word that prompted transfer to Nurse Triage: Symptoms- coughing, sinus drainage. Fever. Body aches all over, head ache, nausea, wheezing, chest tightness

## 2024-07-11 MED ORDER — CYCLOBENZAPRINE HCL 10 MG PO TABS: 10.0000 mg | ORAL_TABLET | Freq: Three times a day (TID) | ORAL | 1 refills | Status: AC | PRN

## 2024-07-11 MED ORDER — ALPRAZOLAM 0.5 MG PO TABS: ORAL_TABLET | ORAL | 1 refills | Status: AC

## 2024-07-11 MED ORDER — HYDROCODONE-ACETAMINOPHEN 7.5-325 MG PO TABS: 1.0000 | ORAL_TABLET | Freq: Three times a day (TID) | ORAL | 0 refills | Status: DC | PRN

## 2024-07-13 ENCOUNTER — Telehealth: Payer: Self-pay | Admitting: Neurology

## 2024-07-13 NOTE — Telephone Encounter (Signed)
 Patient said having flu like symptoms. Want to know if you want me to go ahead and start the home sleep study. Would like a call back

## 2024-07-17 ENCOUNTER — Encounter: Payer: Self-pay | Admitting: Internal Medicine

## 2024-07-17 ENCOUNTER — Ambulatory Visit: Admitting: Internal Medicine

## 2024-07-17 VITALS — BP 144/92 | HR 91 | Ht 63.0 in | Wt 253.4 lb

## 2024-07-17 DIAGNOSIS — E538 Deficiency of other specified B group vitamins: Secondary | ICD-10-CM

## 2024-07-17 DIAGNOSIS — J4541 Moderate persistent asthma with (acute) exacerbation: Secondary | ICD-10-CM

## 2024-07-17 DIAGNOSIS — F909 Attention-deficit hyperactivity disorder, unspecified type: Secondary | ICD-10-CM | POA: Diagnosis not present

## 2024-07-17 DIAGNOSIS — J449 Chronic obstructive pulmonary disease, unspecified: Secondary | ICD-10-CM | POA: Diagnosis not present

## 2024-07-17 DIAGNOSIS — G8929 Other chronic pain: Secondary | ICD-10-CM

## 2024-07-17 DIAGNOSIS — M5440 Lumbago with sciatica, unspecified side: Secondary | ICD-10-CM

## 2024-07-17 DIAGNOSIS — R062 Wheezing: Secondary | ICD-10-CM

## 2024-07-17 MED ORDER — FLUCONAZOLE 150 MG PO TABS: 150.0000 mg | ORAL_TABLET | Freq: Once | ORAL | 2 refills | Status: AC

## 2024-07-17 MED ORDER — HYDROCODONE-ACETAMINOPHEN 7.5-325 MG PO TABS: 1.0000 | ORAL_TABLET | Freq: Three times a day (TID) | ORAL | 0 refills | Status: DC | PRN

## 2024-07-17 MED ORDER — AZITHROMYCIN 250 MG PO TABS: ORAL_TABLET | ORAL | 0 refills | Status: AC

## 2024-07-17 MED ORDER — METHYLPREDNISOLONE 4 MG PO TBPK: ORAL_TABLET | ORAL | 0 refills | Status: DC

## 2024-07-17 MED ORDER — PROMETHAZINE-DM 6.25-15 MG/5ML PO SYRP: 5.0000 mL | ORAL_SOLUTION | Freq: Four times a day (QID) | ORAL | 0 refills | Status: DC | PRN

## 2024-07-17 NOTE — Assessment & Plan Note (Signed)
Off Adderall 

## 2024-07-17 NOTE — Assessment & Plan Note (Signed)
 Worse Medrol  pack PO abx, Diflucan 

## 2024-07-17 NOTE — Patient Instructions (Signed)

## 2024-07-17 NOTE — Assessment & Plan Note (Signed)
  On Norco to 7.5/325 mg  Potential benefits of a long term opioids use as well as potential risks (i.e. addiction risk, apnea etc) and complications (i.e. Somnolence, constipation and others) were explained to the patient and were aknowledged.

## 2024-07-17 NOTE — Assessment & Plan Note (Signed)
 On B12

## 2024-07-17 NOTE — Assessment & Plan Note (Signed)
 Albuterol  prn Treat URI

## 2024-07-17 NOTE — Progress Notes (Signed)
 "  Subjective:  Patient ID: Tiffany Medina, female    DOB: May 28, 1966  Age: 59 y.o. MRN: [id number]  CC: Medical Management of Chronic Issues (2 Month follow up)   HPI Tiffany Medina presents for post-ER visit on 07/11/24. COVID (-), Flu (-), CXR ok C/o URI - SOB, wheezing, yellow drainage F/u anxiety, ADD, HTN  Outpatient Medications Prior to Visit  Medication Sig Dispense Refill   albuterol  (PROVENTIL  HFA) 108 (90 Base) MCG/ACT inhaler Inhale 2 puffs into the lungs every 4 (four) hours as needed for wheezing or shortness of breath. 8.5 g 11   ALPRAZolam  (XANAX ) 0.5 MG tablet TAKE 3 TABLETS BY MOUTH EVERY DAY AT BEDTIME AS NEEDED 90 tablet 1   ASPIRIN  81 PO Take 2 tablets by mouth daily.     buPROPion  (WELLBUTRIN  SR) 200 MG 12 hr tablet TAKE 1 TABLET BY MOUTH EVERY DAY 90 tablet 1   calcitRIOL  (ROCALTROL ) 0.5 MCG capsule TAKE 1 CAPSULE(0.5 MCG) BY MOUTH IN THE MORNING AND AT BEDTIME 60 capsule 11   clotrimazole -betamethasone  (LOTRISONE ) cream Apply 1 Application topically 2 (two) times daily. Use prn 60 g 0   cyanocobalamin  (VITAMIN B12) 1000 MCG/ML injection Inject 1 mL (1,000 mcg total) into the skin every 14 (fourteen) days. 6 mL 3   cyclobenzaprine  (FLEXERIL ) 10 MG tablet Take 1 tablet (10 mg total) by mouth 3 (three) times daily as needed for muscle spasms. 90 tablet 1   diphenoxylate -atropine  (LOMOTIL ) 2.5-0.025 MG tablet Take 1 tablet by mouth 4 (four) times daily as needed for diarrhea or loose stools. 60 tablet 1   estradiol (VIVELLE-DOT) 0.1 MG/24HR patch APPLY 1 PATCH TRANSDERMALLY TWICE A WEEK     furosemide  (LASIX ) 20 MG tablet TAKE 1 TABLET(20 MG) BY MOUTH DAILY 90 tablet 3   ibuprofen  (ADVIL ) 800 MG tablet TAKE 1 TABLET(800 MG) BY MOUTH EVERY 8 HOURS AS NEEDED 90 tablet 0   levothyroxine  (SYNTHROID ) 100 MCG tablet Take 1 tablet (100 mcg total) by mouth daily before breakfast. TAKE 1/2 TABLET BY MOUTH ONCE DAILY BEFORE BREAKFAST (50mcg) 90 tablet 3   modafinil  (PROVIGIL ) 100 MG  tablet Take 1 tablet (100 mg total) by mouth daily. 30 tablet 2   pantoprazole  (PROTONIX ) 40 MG tablet TAKE 1 TABLET(40 MG) BY MOUTH TWICE DAILY 180 tablet 3   promethazine  (PHENERGAN ) 25 MG tablet Take 1 tablet (25 mg total) by mouth every 6 (six) hours as needed for up to 7 days for nausea or vomiting. 60 tablet 1   SYRINGE-NEEDLE, DISP, 3 ML (BD ECLIPSE SYRINGE) 25G X 1 3 ML MISC As directed 50 each 3   HYDROcodone -acetaminophen  (NORCO) 7.5-325 MG tablet Take 1 tablet by mouth every 8 (eight) hours as needed for severe pain (pain score 7-10). 90 tablet 0   HYDROcodone -acetaminophen  (NORCO) 7.5-325 MG tablet Take 1 tablet by mouth every 8 (eight) hours as needed for severe pain (pain score 7-10). 90 tablet 0   HYDROcodone -acetaminophen  (NORCO) 7.5-325 MG tablet Take 1 tablet by mouth every 8 (eight) hours as needed for severe pain (pain score 7-10). 90 tablet 0   No facility-administered medications prior to visit.    ROS: Review of Systems  Constitutional:  Positive for chills and fatigue. Negative for activity change, appetite change and unexpected weight change.  HENT:  Positive for congestion, rhinorrhea, sinus pressure, sinus pain and sore throat. Negative for mouth sores.   Eyes:  Negative for visual disturbance.  Respiratory:  Positive for cough, shortness of breath and wheezing. Negative  for chest tightness.   Cardiovascular:  Negative for chest pain and palpitations.  Gastrointestinal:  Negative for abdominal pain and nausea.  Genitourinary:  Negative for difficulty urinating, frequency and vaginal pain.  Musculoskeletal:  Negative for back pain and gait problem.  Skin:  Negative for pallor and rash.  Neurological:  Negative for dizziness, tremors, weakness, numbness and headaches.  Psychiatric/Behavioral:  Negative for confusion and sleep disturbance.     Objective:  BP (!) 144/92   Pulse 91   Ht 5' 3 (1.6 m)   Wt 253 lb 6.4 oz (114.9 kg)   SpO2 99%   BMI 44.89 kg/m    BP Readings from Last 3 Encounters:  07/17/24 (!) 144/92  05/29/24 (!) 168/108  05/17/24 (!) 166/110    Wt Readings from Last 3 Encounters:  07/17/24 253 lb 6.4 oz (114.9 kg)  05/17/24 243 lb 1.6 oz (110.3 kg)  05/08/24 247 lb (112 kg)    Physical Exam Constitutional:      General: She is not in acute distress.    Appearance: She is well-developed. She is obese.  HENT:     Head: Normocephalic.     Right Ear: External ear normal.     Left Ear: External ear normal.     Nose: Congestion present.     Mouth/Throat:     Pharynx: No oropharyngeal exudate.  Eyes:     General:        Right eye: No discharge.        Left eye: No discharge.     Conjunctiva/sclera: Conjunctivae normal.     Pupils: Pupils are equal, round, and reactive to light.  Neck:     Thyroid : No thyromegaly.     Vascular: No JVD.     Trachea: No tracheal deviation.  Cardiovascular:     Rate and Rhythm: Normal rate and regular rhythm.     Heart sounds: Normal heart sounds.  Pulmonary:     Effort: No respiratory distress.     Breath sounds: No stridor. Wheezing present.  Abdominal:     General: Bowel sounds are normal. There is no distension.     Palpations: Abdomen is soft. There is no mass.     Tenderness: There is no abdominal tenderness. There is no guarding or rebound.  Musculoskeletal:        General: No tenderness.     Cervical back: Normal range of motion and neck supple. No rigidity.  Lymphadenopathy:     Cervical: No cervical adenopathy.  Skin:    Findings: No erythema or rash.  Neurological:     Cranial Nerves: No cranial nerve deficit.     Motor: No abnormal muscle tone.     Coordination: Coordination normal.     Deep Tendon Reflexes: Reflexes normal.  Psychiatric:        Behavior: Behavior normal.        Thought Content: Thought content normal.        Judgment: Judgment normal.     Lab Results  Component Value Date   WBC 7.9 09/06/2023   HGB 15.7 (H) 09/06/2023   HCT 47.8 (H)  09/06/2023   PLT 411.0 (H) 09/06/2023   GLUCOSE 88 05/08/2024   CHOL 154 09/06/2023   TRIG 107.0 09/06/2023   HDL 52.80 09/06/2023   LDLCALC 79 09/06/2023   ALT 21 05/08/2024   AST 19 05/08/2024   NA 138 05/08/2024   K 4.5 05/08/2024   CL 99 05/08/2024   CREATININE 1.17  05/08/2024   BUN 23 05/08/2024   CO2 30 05/08/2024   TSH 0.60 12/21/2023   INR 1.02 11/28/2014   HGBA1C 6.4 05/08/2024    DG INJECT DIAG/THERA/INC NEEDLE/CATH/PLC EPI/LUMB/SAC W/IMG Result Date: 01/25/2023 CLINICAL DATA:  Low back pain radiating to the left leg all the way to the foot. FLUOROSCOPY: Radiation Exposure Index (as provided by the fluoroscopic device): 0 minutes 42 seconds. 70.58 micro gray meter squared PROCEDURE: The procedure, risks, benefits, and alternatives were explained to the patient. Questions regarding the procedure were encouraged and answered. The patient understands and consents to the procedure. LUMBAR EPIDURAL INJECTION: An interlaminar approach was performed on the left at L5-S1. The overlying skin was cleansed and anesthetized. A 20 gauge epidural needle was advanced using loss-of-resistance technique. DIAGNOSTIC EPIDURAL INJECTION: Injection of Isovue -M 200 shows a good epidural pattern with spread above and below the level of needle placement, primarily on the left no vascular opacification is seen. THERAPEUTIC EPIDURAL INJECTION: Eighty mg of Depo-Medrol  mixed with 2 cc 1% lidocaine were instilled. The procedure was well-tolerated, and the patient was discharged thirty minutes following the injection in good condition. COMPLICATIONS: None IMPRESSION: Technically successful L5-S1 left epidural steroid injection. Electronically Signed   By: Oneil Officer M.D.   On: 01/25/2023 15:02    Assessment & Plan:   Problem List Items Addressed This Visit     ADD (attention deficit disorder)   Off Adderall      Wheezing - Primary   Albuterol  prn Treat URI      Low back pain    On Norco to  7.5/325 mg  Potential benefits of a long term opioids use as well as potential risks (i.e. addiction risk, apnea etc) and complications (i.e. Somnolence, constipation and others) were explained to the patient and were aknowledged.      Relevant Medications   HYDROcodone -acetaminophen  (NORCO) 7.5-325 MG tablet   HYDROcodone -acetaminophen  (NORCO) 7.5-325 MG tablet   HYDROcodone -acetaminophen  (NORCO) 7.5-325 MG tablet   methylPREDNISolone  (MEDROL  DOSEPAK) 4 MG TBPK tablet   B12 deficiency   On B12      COPD (chronic obstructive pulmonary disease) (HCC)   Worse Medrol  pack PO abx, Diflucan       Relevant Medications   azithromycin  (ZITHROMAX ) 250 MG tablet   methylPREDNISolone  (MEDROL  DOSEPAK) 4 MG TBPK tablet   promethazine -dextromethorphan (PROMETHAZINE -DM) 6.25-15 MG/5ML syrup   Asthma   Medrol  pack PO abx, Diflucan       Relevant Medications   methylPREDNISolone  (MEDROL  DOSEPAK) 4 MG TBPK tablet      Meds ordered this encounter  Medications   HYDROcodone -acetaminophen  (NORCO) 7.5-325 MG tablet    Sig: Take 1 tablet by mouth every 8 (eight) hours as needed for severe pain (pain score 7-10).    Dispense:  90 tablet    Refill:  0    Please fill on or after 08/06/24 Code: M54.50   HYDROcodone -acetaminophen  (NORCO) 7.5-325 MG tablet    Sig: Take 1 tablet by mouth every 8 (eight) hours as needed for severe pain (pain score 7-10).    Dispense:  90 tablet    Refill:  0    Please fill on or after 09/06/24 Code: M54.50   HYDROcodone -acetaminophen  (NORCO) 7.5-325 MG tablet    Sig: Take 1 tablet by mouth every 8 (eight) hours as needed for severe pain (pain score 7-10).    Dispense:  90 tablet    Refill:  0    Please fill on or after 10/05/24 Code: M54.50   azithromycin  (  ZITHROMAX ) 250 MG tablet    Sig: Take 2 tablets on day 1, then 1 tablet daily on days 2 through 5    Dispense:  6 tablet    Refill:  0   methylPREDNISolone  (MEDROL  DOSEPAK) 4 MG TBPK tablet    Sig: As directed     Dispense:  21 tablet    Refill:  0   fluconazole  (DIFLUCAN ) 150 MG tablet    Sig: Take 1 tablet (150 mg total) by mouth once for 1 dose.    Dispense:  1 tablet    Refill:  2   promethazine -dextromethorphan (PROMETHAZINE -DM) 6.25-15 MG/5ML syrup    Sig: Take 5 mLs by mouth 4 (four) times daily as needed for cough.    Dispense:  240 mL    Refill:  0      Follow-up: Return in about 3 months (around 10/15/2024) for a follow-up visit.  Marolyn Noel, MD "

## 2024-07-17 NOTE — Assessment & Plan Note (Signed)
 Medrol  pack PO abx, Diflucan 

## 2024-07-23 ENCOUNTER — Ambulatory Visit: Admitting: Family Medicine

## 2024-07-23 ENCOUNTER — Ambulatory Visit

## 2024-07-23 ENCOUNTER — Other Ambulatory Visit: Payer: Self-pay

## 2024-07-23 ENCOUNTER — Encounter: Payer: Self-pay | Admitting: Family Medicine

## 2024-07-23 VITALS — HR 89 | Ht 63.0 in | Wt 253.0 lb

## 2024-07-23 DIAGNOSIS — G8929 Other chronic pain: Secondary | ICD-10-CM

## 2024-07-23 DIAGNOSIS — M545 Low back pain, unspecified: Secondary | ICD-10-CM

## 2024-07-23 DIAGNOSIS — M25552 Pain in left hip: Secondary | ICD-10-CM | POA: Diagnosis not present

## 2024-07-23 NOTE — Progress Notes (Signed)
"       ° °  I, Leotis Batter, CMA acting as a scribe for Artist Lloyd, MD.  Tiffany Medina is a 59 y.o. female who presents to [facility] at [facility] today for LBP. Pt was previously seen by Dr. Lloyd on 05/08/24 for L wrist pain.  Today, pt c/o LBP x end of Dec 2025. Pt locates pain to deep in the hip joint. Was seen in the ED for illness, prescribed antibiotic and Prednisone. Prednisone has helped some with the hip pain, has completed course. Notes left-sided LBP.   Pain located left lateral hip and left low back.  Pain does not radiate below the level of the knee.   Dx testing: 09/22/22 L-spine XR  Pertinent review of systems: No fevers or chills  Relevant historical information: Sleep apnea.  Knee arthritis.   Exam:  Pulse 89   Ht 5' 3 (1.6 m)   Wt 253 lb (114.8 kg)   SpO2 95%   BMI 44.82 kg/m  General: Well Developed, well nourished, and in no acute distress.   MSK: L-spine nontender to palpation spinal midline decreased lumbar motion.  Left hip tender palpation greater trochanter.  Normal hip motion.  Pain with hip abduction.    Lab and Radiology Results  Procedure: Real-time Ultrasound Guided Injection of left lateral hip greater trochanter bursa Device: Philips Affiniti 50G/GE Logiq Images permanently stored and available for review in PACS Verbal informed consent obtained.  Discussed risks and benefits of procedure. Warned about infection, bleeding, hyperglycemia damage to structures among others. Patient expresses understanding and agreement Time-out conducted.   Noted no overlying erythema, induration, or other signs of local infection.   Skin prepped in a sterile fashion.   Local anesthesia: Topical Ethyl chloride.   With sterile technique and under real time ultrasound guidance: 40 mg of Kenalog  and 2 mL of Marcaine injected into greater trochanter bursa. Fluid seen entering the bursa.   Completed without difficulty   Pain immediately resolved  suggesting accurate placement of the medication.   Advised to call if fevers/chills, erythema, induration, drainage, or persistent bleeding.   Images permanently stored and available for review in the ultrasound unit.  Impression: Technically successful ultrasound guided injection.    X-ray images L-spine and left hip obtained today personally and independently interpreted.  L-spine: Multilevel degenerative changes.  No acute fractures are visible.  Left hip: Hip DJD.  No acute fractures.  Await formal radiology review     Assessment and Plan: 59 y.o. female with left lateral hip pain due to greater trochanteric bursitis.  Plan for steroid injection today.  Additionally will proceed with x-ray and physical therapy referral.  Left low back pain due to exacerbation of degenerative changes and muscle dysfunction.  Plan for physical therapy.   PDMP not reviewed this encounter. Orders Placed This Encounter  Procedures   US  LIMITED JOINT SPACE STRUCTURES LOW LEFT(NO LINKED CHARGES)    Reason for Exam (SYMPTOM  OR DIAGNOSIS REQUIRED):   left hip pain    Preferred imaging location?:   [facility]   No orders of the defined types were placed in this encounter.    Discussed warning signs or symptoms. Please see discharge instructions. Patient expresses understanding.   The above documentation has been reviewed and is accurate and complete Artist Lloyd, M.D.   "

## 2024-07-23 NOTE — Patient Instructions (Addendum)
 Thank you for coming in today.   You received an injection today. Seek immediate medical attention if the joint becomes red, extremely painful, or is oozing fluid.   Please get an Xray today before you leave.  A referral for physical therapy has been submitted. A representative from the physical therapy office will contact you to coordinate scheduling after confirming your benefits with your insurance provider. If you do not hear from the physical therapy office within the next 1-2 weeks, please let us  know.   See you back as needed.

## 2024-07-25 ENCOUNTER — Other Ambulatory Visit: Payer: Self-pay | Admitting: Internal Medicine

## 2024-07-25 ENCOUNTER — Ambulatory Visit: Payer: Self-pay | Admitting: Family Medicine

## 2024-07-25 NOTE — Progress Notes (Signed)
 Low back x-ray does show some arthritis

## 2024-07-27 NOTE — Progress Notes (Signed)
 Left hip x-ray shows medium arthritis of the left hip.

## 2024-08-01 ENCOUNTER — Encounter: Admitting: Physical Medicine and Rehabilitation

## 2024-08-08 ENCOUNTER — Ambulatory Visit: Payer: Self-pay

## 2024-08-08 NOTE — Telephone Encounter (Signed)
 FYI Only or Action Required?: FYI only for provider: appointment scheduled on 08/09/2024 8:40 AMMatthews, Tiffany Medina, FN[facility].  Patient was last seen in primary care on 07/17/2024 by Plotnikov, Karlynn GAILS, MD.  Called Nurse Triage reporting Rectal Pain and Anal Itching.  Symptoms began 5-6 days ago .  Interventions attempted: Other:  Preporation Medina, sitz bath , tucks pads.  Symptoms are: unchanged.  Triage Disposition: See Physician Within 24 Hours  Patient/caregiver understands and will follow disposition?: Yes            Reason for Disposition  MODERATE-SEVERE rectal pain (i.e., interferes with school, work, or sleep)  Answer Assessment - Initial Assessment Questions Outbound call to patient   Patient reports rectal pain and itching thinks its hemmroids has had these before. Moderate to severe pain with severe itching. Itching more than the pain. There is more than one hemorrhoids of concern when checked self.  very irritated and red but not spreading. Tried preporation Medina Tucks Witch hazel and  Sitz baths.No fever. Takes medication for constipation has not been straining. No blood seen. Wants to see female provider for this tomorrow morning due to transport  08/09/2024 8:40 AM [name], [name], FNP [facility].        1. SYMPTOM:  What's the main symptom you're concerned about? (e.g., pain, itching, swelling, rash)     Rectal itching and pain  2. ONSET: When did the rectal itch and pain  start?     5-6 days ago  3. RECTAL PAIN: Do you have any pain around your rectum? How bad is the pain?  (Scale 0-10; or none, mild, moderate, severe)     7+ 4. RECTAL ITCHING: Do you have any itching in this area? How bad is the itching?  (Scale 0-10; or none, mild, moderate, severe)     severe 5. CONSTIPATION: Do you have constipation? If Yes, ask: How often do you have a bowel movement (BM)?  (Normal range: 3 times a day to every 3 days)  When was  your last BM?       Denies  6. CAUSE: What do you think is causing the anus symptoms?     Thinks hemorrhoids had these before  7. OTHER SYMPTOMS: Do you have any other symptoms?  (e.g., abdomen pain, fever, rectal bleeding, vomiting)    Patient denies the following , fever, vomiting, abdominal pain, blood in stools  Protocols used: Rectal Symptoms-A-AH Message from Tiffany Medina sent at 08/08/2024  1:06 PM EST  Reason for Triage: The patient called in stating she has been treating some Hemorrhoids for the past few days  She has tried the following remedies - Preporation Medina - Tucks Witch hazel - Ointment - Sitz baths  But the itching and soreness still persists. Described as Dull pain, but intense itching.  Looking to possibly get a stronger medication sent to pharmacy/Appt with PCP if needed to fully treat.

## 2024-08-09 ENCOUNTER — Encounter: Payer: Self-pay | Admitting: Family Medicine

## 2024-08-09 ENCOUNTER — Encounter: Admitting: Physical Medicine and Rehabilitation

## 2024-08-09 ENCOUNTER — Ambulatory Visit: Admitting: Family Medicine

## 2024-08-09 VITALS — BP 138/92 | HR 85 | Temp 98.8°F | Ht 63.0 in | Wt 253.2 lb

## 2024-08-09 DIAGNOSIS — B372 Candidiasis of skin and nail: Secondary | ICD-10-CM

## 2024-08-09 DIAGNOSIS — K649 Unspecified hemorrhoids: Secondary | ICD-10-CM

## 2024-08-09 DIAGNOSIS — K6289 Other specified diseases of anus and rectum: Secondary | ICD-10-CM

## 2024-08-09 MED ORDER — FLUCONAZOLE 150 MG PO TABS: ORAL_TABLET | ORAL | 0 refills | Status: AC

## 2024-08-09 MED ORDER — CLOTRIMAZOLE-BETAMETHASONE 1-0.05 % EX CREA: 1.0000 | TOPICAL_CREAM | Freq: Two times a day (BID) | CUTANEOUS | 0 refills | Status: AC

## 2024-08-09 NOTE — Patient Instructions (Signed)
 Dermoplast spray OTC  May keep Tuck's pads in the refrigerator.  May use aloe vera gel and apply to a pad and store in the refrigerator for comfort and to help with swelling and inflammation.  I have also sent in Diflucan  for you to take in case of yeast after antibiotic treatment.  If you are having symptoms when you complete antibiotics, may take 1 tablet.  If you are still having symptoms 3 days later, may take the second tablet.  I have sent in lotrisone  for you to apply to the area twice a day as needed.  Follow-up with me for new or worsening symptoms.

## 2024-08-09 NOTE — Progress Notes (Signed)
 "  Acute Office Visit  Subjective:     Patient ID: Tiffany Medina, female    DOB: January 10, 1966, 59 y.o.   MRN: [id number]  Chief Complaint  Patient presents with   Rectal Pain    Pt reports having hemorrhoids x 6 days. Patient states she used preparation H but there is no relief. Constant pain, itching, and small amounts of blood present when wiping. Pt reports not straining and takes hydrocodone .     HPI  Discussed the use of AI scribe software for clinical note transcription with the patient, who gave verbal consent to proceed.  History of Present Illness Tiffany Medina is a 59 year old female who presents with severe hemorrhoids.  Hemorrhoidal flare - Severe flare of hemorrhoids, worse than prior episodes - Persistent symptoms with minimal relief from OTC treatments including Preparation H wipes and cream, Tucks, sitz baths, and cool compresses  Bowel habits and constipation prevention - No constipation - Uses Phillips daily to prevent constipation while taking hydrocodone  for chronic pain - Regular bowel movements - Good hydration and fiber intake  Chronic pain and musculoskeletal symptoms - Chronic pain due to osteoarthritis and avascular necrosis - Receives regular steroid injections to knees, hip, and back - Daily hydrocodone  use for pain management  Recent infectious illnesses and treatment - Recently completed a Z-Pak for bronchitis and sinusitis - Took one dose of Diflucan  after antibiotic course  Urinary incontinence - Urinary incontinence, especially with long travel - Wears pads for management     ROS Per HPI      Objective:    BP (!) 138/92   Pulse 85   Temp 98.8 F (37.1 C) (Temporal)   Ht 5' 3 (1.6 m)   Wt 253 lb 3.2 oz (114.9 kg)   SpO2 94%   BMI 44.85 kg/m    Physical Exam Vitals and nursing note reviewed. Exam conducted with a chaperone present Tiffany Medina, CMA present as chaperone).  Constitutional:      General: She is not in acute  distress.    Appearance: Normal appearance. She is obese.  HENT:     Head: Normocephalic and atraumatic.     Right Ear: External ear normal.     Left Ear: External ear normal.     Nose: Nose normal.     Mouth/Throat:     Mouth: Mucous membranes are moist.     Pharynx: Oropharynx is clear.  Eyes:     Extraocular Movements: Extraocular movements intact.     Pupils: Pupils are equal, round, and reactive to light.  Cardiovascular:     Rate and Rhythm: Normal rate and regular rhythm.  Pulmonary:     Effort: Pulmonary effort is normal.     Breath sounds: No rhonchi.  Genitourinary:     Comments: Erythematous, shiny, tender rash with satellite lesions consistent with yeast dermatitis  Grade 2 hemorrhoids present to rectum, no bleeding, no discharge Musculoskeletal:        General: Normal range of motion.     Cervical back: Normal range of motion.     Right lower leg: No edema.     Left lower leg: No edema.  Lymphadenopathy:     Cervical: No cervical adenopathy.  Neurological:     General: No focal deficit present.     Mental Status: She is alert and oriented to person, place, and time.  Psychiatric:        Mood and Affect: Mood normal.  Thought Content: Thought content normal.     No results found for any visits on 08/09/24.      Assessment & Plan:   Assessment and Plan Assessment & Plan Rectal discomfort, yeast dermatitis, acute hemorrhoid Severe hemorrhoids with inflammation and perianal candidiasis likely due to recent antibiotic and steroid use. Persistent symptoms despite OTC treatments. - Prescribed Diflucan  for fungal infection. - Prescribed topical antifungal treatment. - Recommended Dermoplast spray for itching and pain relief. - Advised using refrigerated aloe and Tux on a pad for relief. - Continue Phillips for constipation prevention. - Encouraged adequate fluid and fiber intake.     No orders of the defined types were placed in this encounter.     Meds ordered this encounter  Medications   clotrimazole -betamethasone  (LOTRISONE ) cream    Sig: Apply 1 Application topically 2 (two) times daily. Use prn    Dispense:  60 g    Refill:  0   fluconazole  (DIFLUCAN ) 150 MG tablet    Sig: Take 150mg  tablet once, if symptoms are still present in 3 days, may take the second tablet.    Dispense:  2 tablet    Refill:  0    Return if symptoms worsen or fail to improve.  Corean LITTIE Ku, FNP  "

## 2024-08-10 ENCOUNTER — Encounter: Admitting: Physical Medicine and Rehabilitation

## 2024-08-10 DIAGNOSIS — R202 Paresthesia of skin: Secondary | ICD-10-CM

## 2024-08-10 NOTE — Progress Notes (Unsigned)
 Pain Scale   Average Pain 7 Patient advising she has left wrist pain that is constant, she also advised she has numbness, tingling and weakness. Patient is Right Hand Dominate        +Driver, -BT, -Dye Allergies.

## 2024-08-10 NOTE — Progress Notes (Unsigned)
 "  Tiffany Medina - 59 y.o. female MRN [id number]  Date of birth: 04/11/1966  Office Visit Note: Visit Date: 08/10/2024 PCP: Tiffany Karlynn GAILS, MD Referred by: Tiffany Buster, MD  Subjective: Chief Complaint  Patient presents with   Left Wrist - Pain, Numbness, Weakness   HPI: Tiffany Medina is a 59 y.o. female who comes in today at the request of Dr. Anshul Medina for evaluation and management of chronic, worsening and severe pain, numbness and tingling in the Left upper extremities.  Patient is Right hand dominant.   who presents today for ongoing left wrist pain with associated numbness and tingling that has been present now for multiple months.  Has not undergone any significant treatment modalities.  Has trialed some over-the-counter bracing of the right wrist with mild relief.  She does smoke approximately a pack per day.  She is diabetic, recent A1c 6.4.     ROS Otherwise per HPI.  Assessment & Plan: Visit Diagnoses:    ICD-10-CM   1. Paresthesia of skin  R20.2 NCV with EMG (electromyography)       Plan: No additional findings.   Meds & Orders: No orders of the defined types were placed in this encounter.   Orders Placed This Encounter  Procedures   NCV with EMG (electromyography)    Follow-up: No follow-ups on file.   Procedures: No procedures performed      Clinical History: No specialty comments available.   She reports that she has been smoking cigarettes. She started smoking about 27 years ago. She has a 27.1 pack-year smoking history. She has never used smokeless tobacco.  Recent Labs    12/21/23 1004 05/08/24 1013  HGBA1C 6.2 6.4    Objective:  VS:  HT:    WT:   BMI:     BP:   HR: bpm  TEMP: ( )  RESP:  Physical Exam  Ortho Exam  Imaging: No results found.  Past Medical/Family/Surgical/Social History: Medications & Allergies reviewed per EMR, new medications updated. Patient Active Problem List   Diagnosis Date Noted   HTN  (hypertension) 05/29/2024   Osteochondrosis of lunate of left wrist 05/08/2024   Wrist contusion 05/08/2024   Narcolepsy 01/03/2024   Angular stomatitis 01/03/2024   Psychosocial stressors 01/12/2023   Tobacco dependence 01/04/2023   Insulin  resistance 12/29/2022   SOBOE (shortness of breath on exertion) 11/24/2022   OSA on CPAP 11/24/2022   Osteoarthritis of both knees 11/24/2022   Pre-diabetes 11/24/2022   Depression 11/24/2022   BMI 45.0-49.9, adult (HCC) 11/24/2022   Obesity, Starting BMI 47.3 11/24/2022   Depression screen 11/24/2022   Foot pain, left 05/03/2022   Gastroenteritis 02/01/2022   Arthritis 10/23/2021   Blood coagulation disorder 10/23/2021   Degeneration of lumbar intervertebral disc 10/23/2021   History of abnormal cervical Papanicolaou smear 10/23/2021   Nausea & vomiting 10/23/2021   Knee pain, left 08/05/2021   Sinusitis 07/26/2021   COPD (chronic obstructive pulmonary disease) (HCC) 07/23/2021   Degenerative tear of medial meniscus of left knee 06/05/2021   Fall 04/30/2021   B12 deficiency 04/30/2021   Viral gastroenteritis 09/25/2019   Asthma 04/25/2019   Deep venous thrombosis (HCC) 04/25/2019   Diverticulosis of colon 04/25/2019   Headache 04/25/2019   Anxiety disorder 11/24/2018   Subungual exostosis 09/07/2018   Right foot pain 06/23/2018   Contusion of foot 06/22/2018   History of adenomatous polyp of colon 05/05/2018   Laceration of right index finger without foreign body without damage to  nail 10/14/2017   Mouth sores 10/14/2017   Fatigue 04/11/2017   Shortness of breath on exertion 04/11/2017   Hyperglycemia 04/11/2017   Class 3 severe obesity with serious comorbidity and body mass index (BMI) of 45.0 to 49.9 in adult (HCC) 04/11/2017   Chest pain, atypical 03/11/2017   Low back pain 03/11/2017   Leg swelling 01/28/2017   Peripheral neuropathy 01/28/2017   Edema 12/03/2016   Chronic  venous insufficiency 12/03/2016   Osteopenia 09/24/2016   Artificial menopause state 08/25/2016   Asymptomatic menopausal state 08/25/2016   Stress at home 07/07/2016   Insomnia 01/02/2016   GERD (gastroesophageal reflux disease) 09/01/2015   Acute bronchitis 06/27/2015   Wheezing 06/27/2015   Vitamin D  deficiency 06/09/2015   Family history of breast cancer    Cervical adenopathy 04/14/2015   Tachycardia 04/14/2015   Rash and nonspecific skin eruption 01/22/2015   ADD (attention deficit disorder) 01/22/2015   Restless leg syndrome 12/09/2014   CFS (chronic fatigue syndrome) 12/09/2014   OSA (obstructive sleep apnea) 12/09/2014   Hypothyroidism 12/09/2014   Paresthesia 12/09/2014   Heavy tobacco smoker >10 cigarettes per day 12/09/2014   History of DVT of lower extremity 12/09/2014   Discoid lupus 12/09/2014   Personal history of other venous thrombosis and embolism 12/09/2014   Past Medical History:  Diagnosis Date   ADHD (attention deficit hyperactivity disorder)    Ankle pain    Anxiety    Asthma    B12 deficiency    Back pain    Chest pain    Chronic fatigue syndrome    Constipation    COPD (chronic obstructive pulmonary disease) (HCC)    Depression    Discoid lupus    DVT (deep venous thrombosis) (HCC)    Dyspnea    Family history of breast cancer    Family history of colon cancer    Fibromyalgia    Genetic testing 06/20/2015   Negative genetic testing on the Breast/Ovarian cancer panel.  The Breast/Ovarian gene panel offered by [facility] includes sequencing and rearrangement analysis for the following 20 genes:  ATM, BARD1, BRCA1, BRCA2, BRIP1, CDH1, CHEK2, EPCAM, FANCC, MLH1, MSH2, MSH6, NBN, PALB2, PMS2, PTEN, RAD51C, RAD51D, TP53, and XRCC2.   The report date is June 19, 2015.    GERD (gastroesophageal reflux disease)    Hot flashes    Hypothyroidism    Insomnia    Joint pain    Leg edema    Lupus     Neuropathy    Obesity    Osteoarthritis    Pre-diabetes    Sciatica    Sleep apnea    SOB (shortness of breath)    Vitamin D  deficiency    Family History  Problem Relation Age of Onset   Cancer Mother    Breast cancer Mother 52       bilateral breast cancer   Depression Mother    Anxiety disorder Mother    Cancer Father    Hodgkin's lymphoma Father        dx in his 67s   Lung cancer Father 86   Lung cancer Sister 46       adenocarcinoma (also a smoker)   Breast cancer Sister 47   Dementia Sister    Lupus Maternal Uncle    Colon cancer Maternal Uncle        dx in his 78s   Cancer Paternal Uncle        2 paternal uncles with cancer NOS  Diabetes Maternal Grandmother    Colon cancer Maternal Grandfather        late 24s   Diabetes Paternal Grandmother    Colon cancer Paternal Grandfather 32   Cancer Other    Diabetes Other    Mental illness Other    Sleep apnea Neg Hx    Past Surgical History:  Procedure Laterality Date   ABDOMINAL HYSTERECTOMY     APPENDECTOMY     KNEE ARTHROSCOPY W/ MENISCAL REPAIR Right    TUBAL LIGATION  1987   VEIN LIGATION AND STRIPPING     Social History   Occupational History   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

## 2024-08-21 ENCOUNTER — Ambulatory Visit: Admitting: Orthopedic Surgery

## 2024-10-15 ENCOUNTER — Ambulatory Visit: Admitting: Internal Medicine
